# Patient Record
Sex: Female | Born: 1950 | Race: Black or African American | Hispanic: No | Marital: Married | Smoking: Current every day smoker
Health system: Southern US, Community
[De-identification: ages and names within clinical notes are randomized; demographics above are authoritative.]

## PROBLEM LIST (undated history)

## (undated) DIAGNOSIS — D649 Anemia, unspecified: Secondary | ICD-10-CM

## (undated) DIAGNOSIS — E785 Hyperlipidemia, unspecified: Secondary | ICD-10-CM

## (undated) DIAGNOSIS — I509 Heart failure, unspecified: Secondary | ICD-10-CM

## (undated) DIAGNOSIS — M109 Gout, unspecified: Secondary | ICD-10-CM

## (undated) DIAGNOSIS — N189 Chronic kidney disease, unspecified: Secondary | ICD-10-CM

## (undated) DIAGNOSIS — M1990 Unspecified osteoarthritis, unspecified site: Secondary | ICD-10-CM

## (undated) DIAGNOSIS — R768 Other specified abnormal immunological findings in serum: Secondary | ICD-10-CM

## (undated) DIAGNOSIS — I10 Essential (primary) hypertension: Secondary | ICD-10-CM

## (undated) HISTORY — PX: CARDIAC CATHETERIZATION: SHX172

## (undated) HISTORY — DX: Anemia, unspecified: D64.9

## (undated) HISTORY — PX: DIAGNOSTIC LAPAROSCOPY: SUR761

---

## 2004-04-26 ENCOUNTER — Emergency Department: Payer: Self-pay | Admitting: Emergency Medicine

## 2006-02-03 ENCOUNTER — Emergency Department: Payer: Self-pay | Admitting: Emergency Medicine

## 2008-09-23 ENCOUNTER — Inpatient Hospital Stay: Payer: Self-pay | Admitting: Internal Medicine

## 2009-06-02 DIAGNOSIS — I2510 Atherosclerotic heart disease of native coronary artery without angina pectoris: Secondary | ICD-10-CM | POA: Insufficient documentation

## 2010-02-12 HISTORY — PX: REPLACEMENT TOTAL KNEE: SUR1224

## 2010-06-09 DIAGNOSIS — M1710 Unilateral primary osteoarthritis, unspecified knee: Secondary | ICD-10-CM | POA: Insufficient documentation

## 2011-08-29 ENCOUNTER — Emergency Department: Payer: Self-pay | Admitting: Internal Medicine

## 2011-08-29 LAB — BASIC METABOLIC PANEL
Anion Gap: 11 (ref 7–16)
BUN: 25 mg/dL — ABNORMAL HIGH (ref 7–18)
Calcium, Total: 9.2 mg/dL (ref 8.5–10.1)
Chloride: 104 mmol/L (ref 98–107)
Co2: 25 mmol/L (ref 21–32)
Creatinine: 1.6 mg/dL — ABNORMAL HIGH (ref 0.60–1.30)
EGFR (African American): 40 — ABNORMAL LOW
EGFR (Non-African Amer.): 35 — ABNORMAL LOW
Glucose: 79 mg/dL (ref 65–99)
Osmolality: 283 (ref 275–301)
Potassium: 3.5 mmol/L (ref 3.5–5.1)
Sodium: 140 mmol/L (ref 136–145)

## 2011-08-29 LAB — URIC ACID: Uric Acid: 9.9 mg/dL — ABNORMAL HIGH (ref 2.6–6.0)

## 2012-06-05 DIAGNOSIS — M109 Gout, unspecified: Secondary | ICD-10-CM | POA: Insufficient documentation

## 2013-06-12 DIAGNOSIS — N185 Chronic kidney disease, stage 5: Secondary | ICD-10-CM | POA: Insufficient documentation

## 2014-01-19 DIAGNOSIS — M25512 Pain in left shoulder: Secondary | ICD-10-CM | POA: Insufficient documentation

## 2014-05-07 ENCOUNTER — Ambulatory Visit: Payer: Self-pay

## 2015-06-27 DIAGNOSIS — M1711 Unilateral primary osteoarthritis, right knee: Secondary | ICD-10-CM | POA: Insufficient documentation

## 2017-01-30 DIAGNOSIS — K219 Gastro-esophageal reflux disease without esophagitis: Secondary | ICD-10-CM | POA: Diagnosis not present

## 2017-01-30 DIAGNOSIS — E785 Hyperlipidemia, unspecified: Secondary | ICD-10-CM | POA: Diagnosis not present

## 2017-01-30 DIAGNOSIS — F17210 Nicotine dependence, cigarettes, uncomplicated: Secondary | ICD-10-CM | POA: Diagnosis not present

## 2017-01-30 DIAGNOSIS — N183 Chronic kidney disease, stage 3 (moderate): Secondary | ICD-10-CM | POA: Diagnosis not present

## 2017-01-30 DIAGNOSIS — B1920 Unspecified viral hepatitis C without hepatic coma: Secondary | ICD-10-CM | POA: Diagnosis not present

## 2017-01-30 DIAGNOSIS — I2510 Atherosclerotic heart disease of native coronary artery without angina pectoris: Secondary | ICD-10-CM | POA: Diagnosis not present

## 2017-01-30 DIAGNOSIS — R3915 Urgency of urination: Secondary | ICD-10-CM | POA: Diagnosis not present

## 2017-01-30 DIAGNOSIS — Z880 Allergy status to penicillin: Secondary | ICD-10-CM | POA: Diagnosis not present

## 2017-01-30 DIAGNOSIS — I129 Hypertensive chronic kidney disease with stage 1 through stage 4 chronic kidney disease, or unspecified chronic kidney disease: Secondary | ICD-10-CM | POA: Diagnosis not present

## 2017-04-04 ENCOUNTER — Other Ambulatory Visit: Payer: Self-pay | Admitting: Internal Medicine

## 2017-04-04 DIAGNOSIS — I25119 Atherosclerotic heart disease of native coronary artery with unspecified angina pectoris: Secondary | ICD-10-CM | POA: Diagnosis not present

## 2017-04-04 DIAGNOSIS — N184 Chronic kidney disease, stage 4 (severe): Secondary | ICD-10-CM | POA: Insufficient documentation

## 2017-04-04 DIAGNOSIS — Z1231 Encounter for screening mammogram for malignant neoplasm of breast: Secondary | ICD-10-CM | POA: Diagnosis not present

## 2017-04-04 DIAGNOSIS — I10 Essential (primary) hypertension: Secondary | ICD-10-CM | POA: Diagnosis not present

## 2017-04-04 DIAGNOSIS — M1711 Unilateral primary osteoarthritis, right knee: Secondary | ICD-10-CM | POA: Diagnosis not present

## 2017-04-04 DIAGNOSIS — Z0000 Encounter for general adult medical examination without abnormal findings: Secondary | ICD-10-CM | POA: Insufficient documentation

## 2017-04-04 DIAGNOSIS — N183 Chronic kidney disease, stage 3 (moderate): Secondary | ICD-10-CM | POA: Diagnosis not present

## 2017-04-04 DIAGNOSIS — Z1159 Encounter for screening for other viral diseases: Secondary | ICD-10-CM | POA: Diagnosis not present

## 2017-04-04 DIAGNOSIS — I119 Hypertensive heart disease without heart failure: Secondary | ICD-10-CM | POA: Insufficient documentation

## 2017-04-12 DIAGNOSIS — M76891 Other specified enthesopathies of right lower limb, excluding foot: Secondary | ICD-10-CM | POA: Diagnosis not present

## 2017-04-12 DIAGNOSIS — M79604 Pain in right leg: Secondary | ICD-10-CM | POA: Diagnosis not present

## 2017-04-12 DIAGNOSIS — M1711 Unilateral primary osteoarthritis, right knee: Secondary | ICD-10-CM | POA: Diagnosis not present

## 2017-04-16 DIAGNOSIS — Z1159 Encounter for screening for other viral diseases: Secondary | ICD-10-CM | POA: Diagnosis not present

## 2017-04-16 DIAGNOSIS — N183 Chronic kidney disease, stage 3 (moderate): Secondary | ICD-10-CM | POA: Diagnosis not present

## 2017-04-16 DIAGNOSIS — I10 Essential (primary) hypertension: Secondary | ICD-10-CM | POA: Diagnosis not present

## 2017-04-16 DIAGNOSIS — I25119 Atherosclerotic heart disease of native coronary artery with unspecified angina pectoris: Secondary | ICD-10-CM | POA: Diagnosis not present

## 2017-04-16 DIAGNOSIS — Z1231 Encounter for screening mammogram for malignant neoplasm of breast: Secondary | ICD-10-CM | POA: Diagnosis not present

## 2017-04-16 DIAGNOSIS — Z0000 Encounter for general adult medical examination without abnormal findings: Secondary | ICD-10-CM | POA: Diagnosis not present

## 2017-04-26 DIAGNOSIS — M1711 Unilateral primary osteoarthritis, right knee: Secondary | ICD-10-CM | POA: Diagnosis not present

## 2017-04-29 ENCOUNTER — Other Ambulatory Visit: Payer: Self-pay | Admitting: Surgery

## 2017-04-29 DIAGNOSIS — M1711 Unilateral primary osteoarthritis, right knee: Secondary | ICD-10-CM

## 2017-05-16 DIAGNOSIS — Z800 Family history of malignant neoplasm of digestive organs: Secondary | ICD-10-CM | POA: Diagnosis not present

## 2017-05-16 DIAGNOSIS — R768 Other specified abnormal immunological findings in serum: Secondary | ICD-10-CM | POA: Diagnosis not present

## 2017-05-17 ENCOUNTER — Ambulatory Visit
Admission: RE | Admit: 2017-05-17 | Discharge: 2017-05-17 | Disposition: A | Discharge status: Home / Self Care | Payer: Medicare HMO | Source: Ambulatory Visit | Attending: Surgery | Admitting: Surgery

## 2017-05-17 DIAGNOSIS — S83241A Other tear of medial meniscus, current injury, right knee, initial encounter: Secondary | ICD-10-CM | POA: Diagnosis not present

## 2017-05-17 DIAGNOSIS — M1711 Unilateral primary osteoarthritis, right knee: Secondary | ICD-10-CM | POA: Insufficient documentation

## 2017-05-17 DIAGNOSIS — X58XXXA Exposure to other specified factors, initial encounter: Secondary | ICD-10-CM | POA: Diagnosis not present

## 2017-05-17 DIAGNOSIS — M23321 Other meniscus derangements, posterior horn of medial meniscus, right knee: Secondary | ICD-10-CM | POA: Diagnosis not present

## 2017-05-23 ENCOUNTER — Ambulatory Visit
Admission: RE | Admit: 2017-05-23 | Discharge: 2017-05-23 | Disposition: A | Discharge status: Home / Self Care | Payer: Medicare HMO | Source: Ambulatory Visit | Attending: Internal Medicine | Admitting: Internal Medicine

## 2017-05-23 DIAGNOSIS — Z1231 Encounter for screening mammogram for malignant neoplasm of breast: Secondary | ICD-10-CM | POA: Diagnosis not present

## 2017-05-23 DIAGNOSIS — R928 Other abnormal and inconclusive findings on diagnostic imaging of breast: Secondary | ICD-10-CM | POA: Diagnosis not present

## 2017-05-28 ENCOUNTER — Other Ambulatory Visit: Payer: Self-pay | Admitting: Internal Medicine

## 2017-05-28 DIAGNOSIS — N6489 Other specified disorders of breast: Secondary | ICD-10-CM

## 2017-05-28 DIAGNOSIS — R928 Other abnormal and inconclusive findings on diagnostic imaging of breast: Secondary | ICD-10-CM

## 2017-05-29 ENCOUNTER — Other Ambulatory Visit: Payer: Self-pay

## 2017-05-29 ENCOUNTER — Encounter: Payer: Self-pay | Admitting: *Deleted

## 2017-05-29 ENCOUNTER — Ambulatory Visit
Admission: RE | Admit: 2017-05-29 | Discharge: 2017-05-29 | Disposition: A | Discharge status: Home / Self Care | Payer: Medicare HMO | Source: Ambulatory Visit | Attending: Surgery | Admitting: Surgery

## 2017-05-29 DIAGNOSIS — I509 Heart failure, unspecified: Secondary | ICD-10-CM | POA: Insufficient documentation

## 2017-05-29 DIAGNOSIS — Z01818 Encounter for other preprocedural examination: Secondary | ICD-10-CM | POA: Insufficient documentation

## 2017-05-29 DIAGNOSIS — I110 Hypertensive heart disease with heart failure: Secondary | ICD-10-CM | POA: Diagnosis not present

## 2017-05-29 DIAGNOSIS — R0989 Other specified symptoms and signs involving the circulatory and respiratory systems: Secondary | ICD-10-CM | POA: Diagnosis not present

## 2017-05-29 DIAGNOSIS — I10 Essential (primary) hypertension: Secondary | ICD-10-CM | POA: Diagnosis not present

## 2017-05-29 HISTORY — DX: Chronic kidney disease, unspecified: N18.9

## 2017-05-29 HISTORY — DX: Gout, unspecified: M10.9

## 2017-05-29 HISTORY — DX: Heart failure, unspecified: I50.9

## 2017-05-29 HISTORY — DX: Essential (primary) hypertension: I10

## 2017-05-29 HISTORY — DX: Hyperlipidemia, unspecified: E78.5

## 2017-05-29 HISTORY — DX: Unspecified osteoarthritis, unspecified site: M19.90

## 2017-05-29 HISTORY — DX: Other specified abnormal immunological findings in serum: R76.8

## 2017-05-29 LAB — BASIC METABOLIC PANEL
ANION GAP: 5 (ref 5–15)
BUN: 35 mg/dL — ABNORMAL HIGH (ref 6–20)
CALCIUM: 7.5 mg/dL — AB (ref 8.9–10.3)
CO2: 21 mmol/L — AB (ref 22–32)
Chloride: 113 mmol/L — ABNORMAL HIGH (ref 101–111)
Creatinine, Ser: 2.38 mg/dL — ABNORMAL HIGH (ref 0.44–1.00)
GFR, EST AFRICAN AMERICAN: 23 mL/min — AB (ref >60)
GFR, EST NON AFRICAN AMERICAN: 20 mL/min — AB (ref >60)
Glucose, Bld: 131 mg/dL — ABNORMAL HIGH (ref 65–99)
POTASSIUM: 3.8 mmol/L (ref 3.5–5.1)
Sodium: 139 mmol/L (ref 135–145)

## 2017-05-29 LAB — CBC
HEMATOCRIT: 38.9 % (ref 35.0–47.0)
Hemoglobin: 13.1 g/dL (ref 12.0–16.0)
MCH: 31 pg (ref 26.0–34.0)
MCHC: 33.6 g/dL (ref 32.0–36.0)
MCV: 92.2 fL (ref 80.0–100.0)
PLATELETS: 252 10*3/uL (ref 150–440)
RBC: 4.22 MIL/uL (ref 3.80–5.20)
RDW: 17.2 % — ABNORMAL HIGH (ref 11.5–14.5)
WBC: 7.7 10*3/uL (ref 3.6–11.0)

## 2017-05-29 LAB — TYPE AND SCREEN
ABO/RH(D): A POS
ANTIBODY SCREEN: NEGATIVE

## 2017-05-29 LAB — URINALYSIS, ROUTINE W REFLEX MICROSCOPIC
Bacteria, UA: NONE SEEN
Bilirubin Urine: NEGATIVE
GLUCOSE, UA: 50 mg/dL — AB
Hgb urine dipstick: NEGATIVE
Ketones, ur: NEGATIVE mg/dL
Leukocytes, UA: NEGATIVE
NITRITE: NEGATIVE
PH: 5 (ref 5.0–8.0)
Protein, ur: 100 mg/dL — AB
Specific Gravity, Urine: 1.012 (ref 1.005–1.030)
Squamous Epithelial / LPF: NONE SEEN

## 2017-05-29 LAB — SURGICAL PCR SCREEN
MRSA, PCR: NEGATIVE
Staphylococcus aureus: POSITIVE — AB

## 2017-05-29 LAB — PROTIME-INR
INR: 0.9
Prothrombin Time: 12.1 seconds (ref 11.4–15.2)

## 2017-05-29 NOTE — Patient Instructions (Signed)
Your procedure is scheduled on: Tuesday, June 11, 2017 Report to Day Surgery on the 2nd floor of the [facility]. To find out your arrival time, please call [phone] between 1PM - 3PM on: Monday, June 10, 2017  REMEMBER: Instructions that are not followed completely may result in serious medical risk, up to and including death; or upon the discretion of your surgeon and anesthesiologist your surgery may need to be rescheduled.  Do not eat food after midnight the night before your procedure.  No gum chewing, lozengers or hard candies.  You may however, drink CLEAR liquids up to 2 hours before you are scheduled to arrive for your surgery. Do not drink anything within 2 hours of the start of your surgery.  Clear liquids include: - water  - apple juice without pulp - clear gatorade - black coffee or tea (Do NOT add anything to the coffee or tea) Do NOT drink anything that is not on this list.  No Alcohol for 24 hours before or after surgery.  No Smoking including e-cigarettes for 24 hours prior to surgery.  No chewable tobacco products for at least 6 hours prior to surgery.  No nicotine patches on the day of surgery.  On the morning of surgery brush your teeth with toothpaste and water, you may rinse your mouth with mouthwash if you wish. Do not swallow any toothpaste or mouthwash.  Notify your doctor if there is any change in your medical condition (cold, fever, infection).  Do not wear jewelry, make-up, hairpins, clips or nail polish.  Do not wear lotions, powders, or perfumes. You may wear deodorant.  Do not shave 48 hours prior to surgery.   Contacts and dentures may not be worn into surgery.  Do not bring valuables to the hospital, including drivers license, insurance or credit cards.   is not responsible for any belongings or valuables.   TAKE THESE MEDICATIONS THE MORNING OF SURGERY:  1.  AMLODIPINE 2.  IMDUR 3.  METOPROLOL  Use CHG Soap as  directed on instruction sheet.  Follow recommendations from PCP regarding stopping Aspirin.  ON April 23 - Stop Anti-inflammatories (NSAIDS) such as Advil, Aleve, Ibuprofen, Motrin, Naproxen, Naprosyn and Aspirin based products such as Excedrin, Goodys Powder, BC Powder. (May take Tylenol or Acetaminophen if needed.)  ON April 23 - Stop ANY OVER THE COUNTER supplements until after surgery.  Wear comfortable clothing (specific to your surgery type) to the hospital.  Plan for stool softeners for home use.  If you are being admitted to the hospital overnight, leave your suitcase in the car. After surgery it may be brought to your room.  If you are taking public transportation, you will need to have a responsible adult with you. Please confirm with your physician that it is acceptable to use public transportation.   Please call [phone] if you have any questions about these instructions.

## 2017-05-30 ENCOUNTER — Encounter: Payer: Self-pay | Admitting: *Deleted

## 2017-05-30 NOTE — Pre-Procedure Instructions (Addendum)
FAXED POSITIVE STAPH TO [facility] AS INSTRUCTED BY DR J ADAMS, EKG/LABS/REQUEST FOR CLEARANCE REGARDING WORSENING RENAL FUNCTION AND EKG TO DR Bethann Punches. ALSO FAXED TO DR POGGI. SPOKE WITH April AT DR ANDERSON'S

## 2017-06-03 DIAGNOSIS — I25119 Atherosclerotic heart disease of native coronary artery with unspecified angina pectoris: Secondary | ICD-10-CM | POA: Diagnosis not present

## 2017-06-03 DIAGNOSIS — I119 Hypertensive heart disease without heart failure: Secondary | ICD-10-CM | POA: Diagnosis not present

## 2017-06-03 DIAGNOSIS — N183 Chronic kidney disease, stage 3 (moderate): Secondary | ICD-10-CM | POA: Diagnosis not present

## 2017-06-04 NOTE — Pre-Procedure Instructions (Signed)
CLEARED MOD RISK BY DR Ouida Sills 06/03/17

## 2017-06-07 ENCOUNTER — Ambulatory Visit
Admission: RE | Admit: 2017-06-07 | Discharge: 2017-06-07 | Disposition: A | Discharge status: Home / Self Care | Payer: Medicare HMO | Source: Ambulatory Visit | Attending: Internal Medicine | Admitting: Internal Medicine

## 2017-06-07 DIAGNOSIS — R928 Other abnormal and inconclusive findings on diagnostic imaging of breast: Secondary | ICD-10-CM | POA: Diagnosis not present

## 2017-06-07 DIAGNOSIS — N6489 Other specified disorders of breast: Secondary | ICD-10-CM | POA: Insufficient documentation

## 2017-06-07 DIAGNOSIS — R922 Inconclusive mammogram: Secondary | ICD-10-CM | POA: Diagnosis not present

## 2017-06-10 MED ORDER — CLINDAMYCIN PHOSPHATE 900 MG/50ML IV SOLN
900.0000 mg | Freq: Once | INTRAVENOUS | Status: AC
  Administered 2017-06-11: 900 mg via INTRAVENOUS

## 2017-06-11 ENCOUNTER — Encounter
Admission: RE | Disposition: A | Discharge status: Home / Self Care | Payer: Self-pay | Source: Ambulatory Visit | Attending: Surgery

## 2017-06-11 ENCOUNTER — Ambulatory Visit: Payer: Medicare HMO | Admitting: Anesthesiology

## 2017-06-11 ENCOUNTER — Inpatient Hospital Stay: Payer: Medicare HMO

## 2017-06-11 ENCOUNTER — Other Ambulatory Visit: Payer: Self-pay

## 2017-06-11 ENCOUNTER — Observation Stay
Admission: RE | Admit: 2017-06-11 | Discharge: 2017-06-12 | Disposition: A | Discharge status: Home / Self Care | Payer: Medicare HMO | Source: Ambulatory Visit | Attending: Surgery | Admitting: Surgery

## 2017-06-11 DIAGNOSIS — Z79899 Other long term (current) drug therapy: Secondary | ICD-10-CM | POA: Insufficient documentation

## 2017-06-11 DIAGNOSIS — Z96651 Presence of right artificial knee joint: Secondary | ICD-10-CM | POA: Diagnosis not present

## 2017-06-11 DIAGNOSIS — M109 Gout, unspecified: Secondary | ICD-10-CM | POA: Insufficient documentation

## 2017-06-11 DIAGNOSIS — M1711 Unilateral primary osteoarthritis, right knee: Principal | ICD-10-CM | POA: Insufficient documentation

## 2017-06-11 DIAGNOSIS — E785 Hyperlipidemia, unspecified: Secondary | ICD-10-CM | POA: Insufficient documentation

## 2017-06-11 DIAGNOSIS — I130 Hypertensive heart and chronic kidney disease with heart failure and stage 1 through stage 4 chronic kidney disease, or unspecified chronic kidney disease: Secondary | ICD-10-CM | POA: Insufficient documentation

## 2017-06-11 DIAGNOSIS — I509 Heart failure, unspecified: Secondary | ICD-10-CM | POA: Insufficient documentation

## 2017-06-11 DIAGNOSIS — F17200 Nicotine dependence, unspecified, uncomplicated: Secondary | ICD-10-CM | POA: Diagnosis not present

## 2017-06-11 DIAGNOSIS — N183 Chronic kidney disease, stage 3 (moderate): Secondary | ICD-10-CM | POA: Diagnosis not present

## 2017-06-11 DIAGNOSIS — Z471 Aftercare following joint replacement surgery: Secondary | ICD-10-CM | POA: Diagnosis not present

## 2017-06-11 HISTORY — PX: PARTIAL KNEE ARTHROPLASTY: SHX2174

## 2017-06-11 LAB — ABO/RH: ABO/RH(D): A POS

## 2017-06-11 SURGERY — ARTHROPLASTY, KNEE, UNICOMPARTMENTAL
Anesthesia: General | Site: Knee | Laterality: Right | Wound class: Clean

## 2017-06-11 MED ORDER — HYDROMORPHONE HCL 1 MG/ML IJ SOLN: 0.5000 mg | INTRAMUSCULAR | Status: DC | PRN

## 2017-06-11 MED ORDER — ACETAMINOPHEN 500 MG PO TABS
1000.0000 mg | ORAL_TABLET | Freq: Four times a day (QID) | ORAL | Status: AC
  Administered 2017-06-11 – 2017-06-12 (×4): 1000 mg via ORAL
  Filled 2017-06-11 (×4): qty 2

## 2017-06-11 MED ORDER — SODIUM CHLORIDE 0.9 % IV SOLN
INTRAVENOUS | Status: DC
  Administered 2017-06-11: 11:00:00 via INTRAVENOUS

## 2017-06-11 MED ORDER — METOCLOPRAMIDE HCL 5 MG/ML IJ SOLN: 5.0000 mg | Freq: Three times a day (TID) | INTRAMUSCULAR | Status: DC | PRN

## 2017-06-11 MED ORDER — ACETAMINOPHEN 325 MG PO TABS: 325.0000 mg | ORAL_TABLET | Freq: Four times a day (QID) | ORAL | Status: DC | PRN

## 2017-06-11 MED ORDER — SODIUM CHLORIDE 0.9 % IV SOLN
INTRAVENOUS | Status: DC
  Administered 2017-06-11: 06:00:00 via INTRAVENOUS

## 2017-06-11 MED ORDER — ONDANSETRON HCL 4 MG PO TABS: 4.0000 mg | ORAL_TABLET | Freq: Four times a day (QID) | ORAL | Status: DC | PRN

## 2017-06-11 MED ORDER — METOPROLOL SUCCINATE ER 100 MG PO TB24
200.0000 mg | ORAL_TABLET | Freq: Every day | ORAL | Status: DC
  Administered 2017-06-12: 200 mg via ORAL
  Filled 2017-06-11: qty 2

## 2017-06-11 MED ORDER — OXYCODONE HCL 5 MG PO TABS
5.0000 mg | ORAL_TABLET | ORAL | Status: DC | PRN
  Administered 2017-06-11 – 2017-06-12 (×3): 5 mg via ORAL
  Administered 2017-06-12: 10 mg via ORAL
  Filled 2017-06-11 (×2): qty 1
  Filled 2017-06-11: qty 2
  Filled 2017-06-11: qty 1

## 2017-06-11 MED ORDER — OXYCODONE HCL 5 MG/5ML PO SOLN: 5.0000 mg | Freq: Once | ORAL | Status: DC | PRN

## 2017-06-11 MED ORDER — BUPIVACAINE-EPINEPHRINE (PF) 0.5% -1:200000 IJ SOLN
INTRAMUSCULAR | Status: AC
  Filled 2017-06-11: qty 30

## 2017-06-11 MED ORDER — ONDANSETRON HCL 4 MG/2ML IJ SOLN
INTRAMUSCULAR | Status: DC | PRN
  Administered 2017-06-11: 4 mg via INTRAVENOUS

## 2017-06-11 MED ORDER — MAGNESIUM HYDROXIDE 400 MG/5ML PO SUSP: 30.0000 mL | Freq: Every day | ORAL | Status: DC | PRN

## 2017-06-11 MED ORDER — ENOXAPARIN SODIUM 30 MG/0.3ML ~~LOC~~ SOLN
30.0000 mg | SUBCUTANEOUS | Status: DC
  Filled 2017-06-11: qty 0.3

## 2017-06-11 MED ORDER — TRANEXAMIC ACID 1000 MG/10ML IV SOLN
INTRAVENOUS | Status: AC
  Filled 2017-06-11: qty 10

## 2017-06-11 MED ORDER — PROPOFOL 10 MG/ML IV BOLUS
INTRAVENOUS | Status: DC | PRN
  Administered 2017-06-11: 150 mg via INTRAVENOUS

## 2017-06-11 MED ORDER — SUGAMMADEX SODIUM 200 MG/2ML IV SOLN
INTRAVENOUS | Status: DC | PRN
  Administered 2017-06-11: 100 mg via INTRAVENOUS

## 2017-06-11 MED ORDER — TRAMADOL HCL 50 MG PO TABS
50.0000 mg | ORAL_TABLET | Freq: Four times a day (QID) | ORAL | Status: DC | PRN
  Administered 2017-06-11 – 2017-06-12 (×2): 50 mg via ORAL
  Filled 2017-06-11 (×2): qty 1

## 2017-06-11 MED ORDER — SODIUM CHLORIDE 0.9 % IJ SOLN
INTRAMUSCULAR | Status: AC
  Filled 2017-06-11: qty 50

## 2017-06-11 MED ORDER — REMIFENTANIL HCL 1 MG IV SOLR
INTRAVENOUS | Status: DC | PRN
  Administered 2017-06-11: .01 ug/kg/min via INTRAVENOUS

## 2017-06-11 MED ORDER — FLEET ENEMA 7-19 GM/118ML RE ENEM: 1.0000 | ENEMA | Freq: Once | RECTAL | Status: DC | PRN

## 2017-06-11 MED ORDER — DIPHENHYDRAMINE HCL 12.5 MG/5ML PO ELIX: 12.5000 mg | ORAL_SOLUTION | ORAL | Status: DC | PRN

## 2017-06-11 MED ORDER — NEOMYCIN-POLYMYXIN B GU 40-200000 IR SOLN
Status: AC
  Filled 2017-06-11: qty 20

## 2017-06-11 MED ORDER — ROCURONIUM BROMIDE 50 MG/5ML IV SOLN
INTRAVENOUS | Status: AC
Start: 2017-06-11 — End: ?
  Filled 2017-06-11: qty 1

## 2017-06-11 MED ORDER — NEOMYCIN-POLYMYXIN B GU 40-200000 IR SOLN
Status: DC | PRN
  Administered 2017-06-11: 14 mL

## 2017-06-11 MED ORDER — ASPIRIN EC 81 MG PO TBEC
81.0000 mg | DELAYED_RELEASE_TABLET | Freq: Every day | ORAL | Status: DC
  Administered 2017-06-12: 81 mg via ORAL
  Filled 2017-06-11: qty 1

## 2017-06-11 MED ORDER — CLINDAMYCIN PHOSPHATE 900 MG/50ML IV SOLN
INTRAVENOUS | Status: AC
  Filled 2017-06-11: qty 50

## 2017-06-11 MED ORDER — ATORVASTATIN CALCIUM 20 MG PO TABS
40.0000 mg | ORAL_TABLET | Freq: Every day | ORAL | Status: DC
  Administered 2017-06-12: 40 mg via ORAL
  Filled 2017-06-11: qty 2

## 2017-06-11 MED ORDER — FENTANYL CITRATE (PF) 100 MCG/2ML IJ SOLN
INTRAMUSCULAR | Status: DC | PRN
  Administered 2017-06-11: 100 ug via INTRAVENOUS

## 2017-06-11 MED ORDER — LIDOCAINE HCL (CARDIAC) PF 100 MG/5ML IV SOSY
PREFILLED_SYRINGE | INTRAVENOUS | Status: DC | PRN
  Administered 2017-06-11: 100 mg via INTRAVENOUS

## 2017-06-11 MED ORDER — SUGAMMADEX SODIUM 200 MG/2ML IV SOLN
INTRAVENOUS | Status: AC
  Filled 2017-06-11: qty 2

## 2017-06-11 MED ORDER — ALLOPURINOL 100 MG PO TABS
100.0000 mg | ORAL_TABLET | Freq: Every day | ORAL | Status: DC
  Administered 2017-06-12: 100 mg via ORAL
  Filled 2017-06-11 (×2): qty 1

## 2017-06-11 MED ORDER — MUPIROCIN 2 % EX OINT
1.0000 "application " | TOPICAL_OINTMENT | Freq: Two times a day (BID) | CUTANEOUS | Status: DC
  Administered 2017-06-11 – 2017-06-12 (×2): 1 via NASAL
  Filled 2017-06-11: qty 22

## 2017-06-11 MED ORDER — ROCURONIUM BROMIDE 50 MG/5ML IV SOLN
INTRAVENOUS | Status: AC
  Filled 2017-06-11: qty 1

## 2017-06-11 MED ORDER — BUPIVACAINE LIPOSOME 1.3 % IJ SUSP
INTRAMUSCULAR | Status: AC
  Filled 2017-06-11: qty 20

## 2017-06-11 MED ORDER — PHENYLEPHRINE HCL 10 MG/ML IJ SOLN
INTRAMUSCULAR | Status: AC
  Filled 2017-06-11: qty 1

## 2017-06-11 MED ORDER — REMIFENTANIL HCL 1 MG IV SOLR
INTRAVENOUS | Status: AC
  Filled 2017-06-11: qty 1000

## 2017-06-11 MED ORDER — DEXTROSE 5 % IV SOLN
Freq: Four times a day (QID) | INTRAVENOUS | Status: AC
  Administered 2017-06-11 – 2017-06-12 (×3): via INTRAVENOUS
  Filled 2017-06-11 (×5): qty 6

## 2017-06-11 MED ORDER — ONDANSETRON HCL 4 MG/2ML IJ SOLN
INTRAMUSCULAR | Status: AC
  Filled 2017-06-11: qty 2

## 2017-06-11 MED ORDER — ACETAMINOPHEN 10 MG/ML IV SOLN
INTRAVENOUS | Status: DC | PRN
  Administered 2017-06-11: 1000 mg via INTRAVENOUS

## 2017-06-11 MED ORDER — LISINOPRIL 20 MG PO TABS
30.0000 mg | ORAL_TABLET | Freq: Every day | ORAL | Status: DC
  Administered 2017-06-12: 30 mg via ORAL
  Filled 2017-06-11: qty 2

## 2017-06-11 MED ORDER — OXYCODONE HCL 5 MG PO TABS: 5.0000 mg | ORAL_TABLET | Freq: Once | ORAL | Status: DC | PRN

## 2017-06-11 MED ORDER — FENTANYL CITRATE (PF) 100 MCG/2ML IJ SOLN: 25.0000 ug | INTRAMUSCULAR | Status: DC | PRN

## 2017-06-11 MED ORDER — BUPIVACAINE-EPINEPHRINE (PF) 0.5% -1:200000 IJ SOLN
INTRAMUSCULAR | Status: DC | PRN
  Administered 2017-06-11: 30 mL via PERINEURAL

## 2017-06-11 MED ORDER — BISACODYL 10 MG RE SUPP: 10.0000 mg | Freq: Every day | RECTAL | Status: DC | PRN

## 2017-06-11 MED ORDER — METOCLOPRAMIDE HCL 10 MG PO TABS: 5.0000 mg | ORAL_TABLET | Freq: Three times a day (TID) | ORAL | Status: DC | PRN

## 2017-06-11 MED ORDER — ONDANSETRON HCL 4 MG/2ML IJ SOLN: 4.0000 mg | Freq: Four times a day (QID) | INTRAMUSCULAR | Status: DC | PRN

## 2017-06-11 MED ORDER — ROCURONIUM BROMIDE 100 MG/10ML IV SOLN
INTRAVENOUS | Status: DC | PRN
  Administered 2017-06-11: 50 mg via INTRAVENOUS

## 2017-06-11 MED ORDER — ISOSORBIDE MONONITRATE ER 60 MG PO TB24
60.0000 mg | ORAL_TABLET | Freq: Every day | ORAL | Status: DC
  Administered 2017-06-12: 60 mg via ORAL
  Filled 2017-06-11: qty 1

## 2017-06-11 MED ORDER — AMLODIPINE BESYLATE 10 MG PO TABS
10.0000 mg | ORAL_TABLET | Freq: Every day | ORAL | Status: DC
  Administered 2017-06-12: 10 mg via ORAL
  Filled 2017-06-11: qty 1

## 2017-06-11 MED ORDER — ACETAMINOPHEN 10 MG/ML IV SOLN
INTRAVENOUS | Status: AC
  Filled 2017-06-11: qty 100

## 2017-06-11 MED ORDER — GLYCOPYRROLATE 0.2 MG/ML IJ SOLN
INTRAMUSCULAR | Status: DC | PRN
  Administered 2017-06-11: 0.2 mg via INTRAVENOUS

## 2017-06-11 MED ORDER — FAMOTIDINE 20 MG PO TABS
20.0000 mg | ORAL_TABLET | Freq: Once | ORAL | Status: AC
  Administered 2017-06-11: 20 mg via ORAL

## 2017-06-11 MED ORDER — CHLORHEXIDINE GLUCONATE CLOTH 2 % EX PADS
6.0000 | MEDICATED_PAD | Freq: Every day | CUTANEOUS | Status: DC
  Administered 2017-06-12 (×2): 6 via TOPICAL

## 2017-06-11 MED ORDER — FENTANYL CITRATE (PF) 100 MCG/2ML IJ SOLN
INTRAMUSCULAR | Status: AC
  Filled 2017-06-11: qty 2

## 2017-06-11 MED ORDER — MIDAZOLAM HCL 2 MG/2ML IJ SOLN
INTRAMUSCULAR | Status: DC | PRN
  Administered 2017-06-11: 2 mg via INTRAVENOUS

## 2017-06-11 MED ORDER — LIDOCAINE HCL (PF) 2 % IJ SOLN
INTRAMUSCULAR | Status: AC
  Filled 2017-06-11: qty 10

## 2017-06-11 MED ORDER — PROPOFOL 10 MG/ML IV BOLUS
INTRAVENOUS | Status: AC
  Filled 2017-06-11: qty 20

## 2017-06-11 MED ORDER — DOCUSATE SODIUM 100 MG PO CAPS
100.0000 mg | ORAL_CAPSULE | Freq: Two times a day (BID) | ORAL | Status: DC
  Administered 2017-06-12: 100 mg via ORAL
  Filled 2017-06-11 (×2): qty 1

## 2017-06-11 MED ORDER — PANTOPRAZOLE SODIUM 40 MG PO TBEC
40.0000 mg | DELAYED_RELEASE_TABLET | Freq: Every day | ORAL | Status: DC
  Administered 2017-06-12: 40 mg via ORAL
  Filled 2017-06-11: qty 1

## 2017-06-11 MED ORDER — MIDAZOLAM HCL 2 MG/2ML IJ SOLN
INTRAMUSCULAR | Status: AC
  Filled 2017-06-11: qty 2

## 2017-06-11 MED ORDER — FAMOTIDINE 20 MG PO TABS
ORAL_TABLET | ORAL | Status: AC
  Administered 2017-06-11: 20 mg via ORAL
  Filled 2017-06-11: qty 1

## 2017-06-11 MED ORDER — SODIUM CHLORIDE 0.9 % IV SOLN
INTRAVENOUS | Status: DC | PRN
  Administered 2017-06-11: 60 mL

## 2017-06-11 MED ORDER — CLINDAMYCIN PHOSPHATE 900 MG/50ML IV SOLN
900.0000 mg | Freq: Four times a day (QID) | INTRAVENOUS | Status: DC
  Administered 2017-06-11: 900 mg via INTRAVENOUS
  Filled 2017-06-11 (×4): qty 50

## 2017-06-11 MED ORDER — EPHEDRINE SULFATE 50 MG/ML IJ SOLN
INTRAMUSCULAR | Status: DC | PRN
  Administered 2017-06-11: 15 mg via INTRAVENOUS

## 2017-06-11 SURGICAL SUPPLY — 60 items
BANDAGE ACE 6X5 VEL STRL LF (GAUZE/BANDAGES/DRESSINGS) ×3 IMPLANT
BEARING TIBIAL OXFORD R XS S7 (Knees) ×3 IMPLANT
CANISTER SUCT 1200ML W/VALVE (MISCELLANEOUS) ×3 IMPLANT
CANISTER SUCT 3000ML PPV (MISCELLANEOUS) ×3 IMPLANT
CEMENT BONE R 1X40 (Cement) ×3 IMPLANT
CEMENT VACUUM MIXING SYSTEM (MISCELLANEOUS) ×3 IMPLANT
CHLORAPREP W/TINT 26ML (MISCELLANEOUS) ×6 IMPLANT
COOLER POLAR GLACIER W/PUMP (MISCELLANEOUS) ×3 IMPLANT
COVER MAYO STAND STRL (DRAPES) ×3 IMPLANT
CUFF TOURN 24 STER (MISCELLANEOUS) IMPLANT
CUFF TOURN 30 STER DUAL PORT (MISCELLANEOUS) ×3 IMPLANT
DRAPE C-ARM XRAY 36X54 (DRAPES) IMPLANT
DRSG OPSITE POSTOP 4X12 (GAUZE/BANDAGES/DRESSINGS) IMPLANT
DRSG OPSITE POSTOP 4X14 (GAUZE/BANDAGES/DRESSINGS) IMPLANT
DRSG OPSITE POSTOP 4X6 (GAUZE/BANDAGES/DRESSINGS) IMPLANT
ELECT CAUTERY BLADE 6.4 (BLADE) ×3 IMPLANT
ELECT REM PT RETURN 9FT ADLT (ELECTROSURGICAL) ×3
ELECTRODE REM PT RTRN 9FT ADLT (ELECTROSURGICAL) ×1 IMPLANT
GAUZE PETRO XEROFOAM 1X8 (MISCELLANEOUS) IMPLANT
GAUZE SPONGE 4X4 12PLY STRL (GAUZE/BANDAGES/DRESSINGS) ×3 IMPLANT
GLOVE BIO SURGEON STRL SZ7.5 (GLOVE) ×12 IMPLANT
GLOVE BIO SURGEON STRL SZ8 (GLOVE) ×12 IMPLANT
GLOVE BIOGEL PI IND STRL 8 (GLOVE) ×1 IMPLANT
GLOVE BIOGEL PI INDICATOR 8 (GLOVE) ×2
GLOVE INDICATOR 8.0 STRL GRN (GLOVE) ×3 IMPLANT
GOWN STRL REUS W/ TWL LRG LVL3 (GOWN DISPOSABLE) ×1 IMPLANT
GOWN STRL REUS W/ TWL XL LVL3 (GOWN DISPOSABLE) ×1 IMPLANT
GOWN STRL REUS W/TWL LRG LVL3 (GOWN DISPOSABLE) ×2
GOWN STRL REUS W/TWL XL LVL3 (GOWN DISPOSABLE) ×2
HOLDER FOLEY CATH W/STRAP (MISCELLANEOUS) ×3 IMPLANT
HOOD PEEL AWAY FLYTE STAYCOOL (MISCELLANEOUS) ×12 IMPLANT
KIT TURNOVER KIT A (KITS) ×3 IMPLANT
KNEE PARTIAL CEMENT FEM XS (Miscellaneous) ×3 IMPLANT
MAT BLUE FLOOR 46X72 FLO (MISCELLANEOUS) IMPLANT
NDL SAFETY ECLIPSE 18X1.5 (NEEDLE) ×1 IMPLANT
NEEDLE HYPO 18GX1.5 SHARP (NEEDLE) ×2
NEEDLE SPNL 20GX3.5 QUINCKE YW (NEEDLE) ×6 IMPLANT
NS IRRIG 1000ML POUR BTL (IV SOLUTION) ×3 IMPLANT
PACK BLADE SAW RECIP 70 3 PT (BLADE) ×6 IMPLANT
PACK TOTAL KNEE (MISCELLANEOUS) ×3 IMPLANT
PAD WRAPON POLAR KNEE (MISCELLANEOUS) ×1 IMPLANT
PULSAVAC PLUS IRRIG FAN TIP (DISPOSABLE) ×3
SOL .9 NS 3000ML IRR  AL (IV SOLUTION) ×2
SOL .9 NS 3000ML IRR UROMATIC (IV SOLUTION) ×1 IMPLANT
SPONGE XRAY 4X4 16PLY STRL (MISCELLANEOUS) IMPLANT
STAPLER SKIN PROX 35W (STAPLE) ×3 IMPLANT
STRAP SAFETY 5IN WIDE (MISCELLANEOUS) ×3 IMPLANT
SUCTION FRAZIER HANDLE 10FR (MISCELLANEOUS) ×2
SUCTION TUBE FRAZIER 10FR DISP (MISCELLANEOUS) ×1 IMPLANT
SUT VIC AB 0 CT1 36 (SUTURE) ×3 IMPLANT
SUT VIC AB 2-0 CT1 27 (SUTURE) ×8
SUT VIC AB 2-0 CT1 TAPERPNT 27 (SUTURE) ×4 IMPLANT
SYR 10ML LL (SYRINGE) ×3 IMPLANT
SYR 20CC LL (SYRINGE) ×3 IMPLANT
SYR 30ML LL (SYRINGE) ×9 IMPLANT
TAPE TRANSPORE STRL 2 31045 (GAUZE/BANDAGES/DRESSINGS) ×3 IMPLANT
TIP FAN IRRIG PULSAVAC PLUS (DISPOSABLE) ×1 IMPLANT
TRAY FOLEY W/METER SILVER 16FR (SET/KITS/TRAYS/PACK) IMPLANT
TRAY TIBIA MEDIAL OXFORD SZ A (Joint) ×3 IMPLANT
WRAPON POLAR PAD KNEE (MISCELLANEOUS) ×3

## 2017-06-11 NOTE — Evaluation (Signed)
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  PMH includes: CHF, HTN, OA, CKD III, hep-c, and L TKA.    Clinical Impression  Pt presents with deficits in strength, transfers, mobility, gait, R knee ROM, balance, and activity tolerance but overall performed well considering POD 0 status.  Pt was Ind with bed mobility tasks and CGA with transfers with mod verbal cues for sequencing.  Pt was very impulsive during the session and frequently initiated movement prior to instructions being completed for sequencing.  Pt was antalgic and guarded on the RLE during amb and went from step-to gait to hop-to gait after several steps.  I anticipate patient to progress well in acute care and to be appropriate for HHPT services upon discharge to address the above deficits for return towards PLOF.      Follow Up Recommendations Home health PT;Supervision for mobility/OOB    Equipment Recommendations  None recommended by PT    Recommendations for Other Services       Precautions / Restrictions Precautions Precautions: Knee;Fall Precaution Comments: Pt able to perform Ind RLE SLR x 10 without extensor lag Restrictions Weight Bearing Restrictions: Yes RLE Weight Bearing: Weight bearing as tolerated      Mobility  Bed Mobility Overal bed mobility: Independent                Transfers Overall transfer level: Needs assistance Equipment used: Rolling walker (2 wheeled) Transfers: Sit to/from Stand Sit to Stand: Min guard         General transfer comment: Pt steady with good concentric control and fair eccentric control during sit to/from stand transfers with mod verbal cues for sequencing.  Ambulation/Gait Ambulation/Gait assistance: Min guard Ambulation  Distance (Feet): 5 Feet Assistive device: Rolling walker (2 wheeled) Gait Pattern/deviations: Step-to pattern;Antalgic;Decreased step length - left;Decreased stance time - right Gait velocity: Decreased   General Gait Details: Pt antalgic on the RLE during amb and went from step-to gait to hop-to gait after several steps  Stairs            Wheelchair Mobility    Modified Rankin (Stroke Patients Only)       Balance Overall balance assessment: Mild deficits observed, not formally tested                                           Pertinent Vitals/Pain Pain Assessment: 0-10 Pain Score: 8  Pain Descriptors / Indicators: Aching;Sore;Operative site guarding Pain Intervention(s): Monitored during session;Limited activity within patient's tolerance;Patient requesting pain meds-RN notified;Premedicated before session    Home Living Family/patient expects to be discharged to:: Private residence Living Arrangements: Spouse/significant other Available Help at Discharge: Family Type of Home: Apartment Home Access: Stairs to enter Entrance Stairs-Rails: Right;Left(Can not reach both) Entrance Stairs-Number of Steps: 4 Home Layout: One level Home Equipment: Walker - 2 wheels      Prior Function Level of Independence: Independent         Comments: Ind amb without AD without fall history, Ind with all ADLs     Hand Dominance   Dominant Hand: Right    Extremity/Trunk Assessment   Upper Extremity Assessment Upper Extremity  Assessment: Overall WFL for tasks assessed    Lower Extremity Assessment Lower Extremity Assessment: Generalized weakness;RLE deficits/detail RLE Deficits / Details: R hip flex 3+/5, R ankle DF and PF strong against manual resistance RLE: Unable to fully assess due to pain RLE Sensation: WNL       Communication   Communication: No difficulties  Cognition Arousal/Alertness: Awake/alert Behavior During Therapy: WFL for tasks  assessed/performed Overall Cognitive Status: Within Functional Limits for tasks assessed                                 General Comments: Pt impulsive and frequently initiates movement prior to instructions being completed      General Comments      Exercises Total Joint Exercises Ankle Circles/Pumps: Strengthening;Both;10 reps Quad Sets: Strengthening;Both;10 reps Gluteal Sets: Strengthening;Both;10 reps Hip ABduction/ADduction: AROM;Both;10 reps Straight Leg Raises: AROM;Both;10 reps Long Arc Quad: AROM;Right;5 reps;10 reps Knee Flexion: AROM;Right;5 reps;10 reps Goniometric ROM: R knee AROM: 4-105 deg Marching in Standing: AROM;Both;5 reps Other Exercises Other Exercises: HEP education per handout Other Exercises: Positioning education to encourage R knee ext at rest   Assessment/Plan    PT Assessment Patient needs continued PT services  PT Problem List Decreased strength;Decreased range of motion;Decreased activity tolerance;Decreased balance;Decreased knowledge of use of DME       PT Treatment Interventions DME instruction;Gait training;Stair training;Functional mobility training;Balance training;Therapeutic exercise;Therapeutic activities;Patient/family education    PT Goals (Current goals can be found in the Care Plan section)  Acute Rehab PT Goals Patient Stated Goal: To get back home PT Goal Formulation: With patient Time For Goal Achievement: 06/24/17 Potential to Achieve Goals: Good    Frequency BID   Barriers to discharge        Co-evaluation               AM-PAC PT "6 Clicks" Daily Activity  Outcome Measure Difficulty turning over in bed (including adjusting bedclothes, sheets and blankets)?: None Difficulty moving from lying on back to sitting on the side of the bed? : None Difficulty sitting down on and standing up from a chair with arms (e.g., wheelchair, bedside commode, etc,.)?: Unable Help needed moving to and from a bed to  chair (including a wheelchair)?: A Little Help needed walking in hospital room?: A Lot Help needed climbing 3-5 steps with a railing? : A Lot 6 Click Score: 16    End of Session Equipment Utilized During Treatment: Gait belt;Oxygen Activity Tolerance: Patient limited by pain Patient left: in chair;with chair alarm set;with call bell/phone within reach Nurse Communication: Mobility status PT Visit Diagnosis: Muscle weakness (generalized) (M62.81);Other abnormalities of gait and mobility (R26.89)    Time: 5374-8270 PT Time Calculation (min) (ACUTE ONLY): 34 min   Charges:   PT Evaluation $PT Eval Low Complexity: 1 Low PT Treatments $Therapeutic Exercise: 8-22 mins   PT G Codes:        DRoyetta Asal PT, DPT 06/11/17, 4:14 PM

## 2017-06-11 NOTE — H&P (Signed)
Paper H&P to be scanned into permanent record. H&P reviewed and patient re-examined. No changes. 

## 2017-06-11 NOTE — Op Note (Signed)
06/11/2017  9:45 AM  Patient:   Anna Key  Pre-Op Diagnosis:   Osteoarthritis of medial compartment, right knee.  Post-Op Diagnosis:   Same  Procedure:   Right unicondylar knee arthroplasty.  Surgeon:   Pascal Lux, MD  Assistant:   Cameron Proud, PA-C;  Cadence Kathlen Mody, PA-S  Anesthesia:   GET  Findings:   As above.  Complications:   None  EBL:   5 cc  Fluids:   800 cc crystalloid  UOP:   None  TT:   80 minutes at 300 mmHg  Drains:   None  Closure:   Staples  Implants:   All-cemented Biomet Oxford system with an extra small femoral component, an "A" sized tibial tray, and a 7 mm meniscal bearing insert.  Brief Clinical Note:   The patient is a 67 year old female with a long history of progressively worsening medial sided right knee pain. Her symptoms have progressed despite medications, activity modification, etc. Her history and examination are consistent with degenerative joint disease confirmed by plain radiographs. An MRI scan demonstrated only minimal degenerative changes laterally and involving the patellofemoral compartment. The patient presents at this time for a right partial knee replacement.  Procedure:   The patient was brought into the operating room and lain in the supine position.  After adequate general endotracheal intubation and anesthesia were obtained, the patient was repositioned so that the non-surgical leg was placed in a flexed and abducted position in the yellow fin leg holder while the surgical extremity was placed over the Biomet leg holder. The right lower extremity was prepped with ChloraPrep solution before being draped sterilely. Preoperative antibiotics were administered. After performing a timeout to verify the appropriate surgical site, the limb was exsanguinated with an Esmarch and the tourniquet inflated to 300 mmHg. A standard anterior approach to the knee was made through an approximately 3.5-4 inch incision. The incision was  carried down through the subcutaneous tissues to expose the superficial retinaculum. This was split the length the incision and medial flap elevated sufficiently to expose the medial retinaculum. This was incised along the medial border of the patella tendon and extended proximally along the medial border of the patella, leaving a 3-4 mm cuff of tissue. The soft tissues were elevated off the anteromedial aspect of the proximal tibia. The anterior portion of the meniscus was removed after performing a subtotal excision of the infrapatellar fat pad. The anterior cruciate ligament was inspected and found to be in excellent condition. Osteophytes were removed from the inferior pole of the patella as well as from the notch using a quarter-inch osteotome. There were significant degenerative changes of both the femur and tibia on the medial side. The medial femoral condyle was sized using the small and extra small sizers. It was felt that the extra small guide best optimized the contour of the femur. This was left in place and the external tibial guide positioned. The coupling device was used to connect the guide to the medial femoral condylar sizer to optimize appropriate orientation. Two guide pins were inserted into the cutting block before the coupling device and sizer were removed. The appropriate tibial cut was made using the oscillating and reciprocating saws. The piece was removed in its entirety and taken to the back table where it was sized and found to be optimally replicated by an "A" sized component. The 9 mm spacer was inserted to verify that sufficient bone had been removed.  Attention was directed to femoral side.  The intramedullary canal was accessed through a 4 mm drill hole. The intramedullary guide was positioned before the guide for the femoral condylar holes was positioned. The appropriate coupling device connected this guide to the intramedullary guide before both drill holes were created in the  distal aspect of the medial femoral condyle. The devices were removed and the posterior condylar cutting block inserted. The appropriate cut was made using the reciprocating saw and this piece removed. The #0 spigot was inserted and the initial bone milling performed. A trial femoral component was inserted and both the flexion and extension gaps measured. In flexion, the gap measured 9 mm whereas in extension, it measured 7 mm. Therefore, the #2 spigot was selected and the secondary bone milling performed. Repeat sizing demonstrated symmetric flexion and extension gaps. The bone was removed from the postero-medial and postero-lateral aspects of the femoral condyle, as well as from the beneath the collar of the spigot. Bone also was removed from the anterior portion of the femur so as to minimize any potential impingement with the meniscal bearing insert. The trial components removed and several drill holes placed into the distal femoral condyle to further augment cement fixation.  Attention was redirected to the tibial side. The "A" sized tibial tray was positioned and temporarily secured using the appropriate spiked nail. The keel was created using the bi-bladed reciprocating saw and hoe. The keeled "A" sized trial tibial tray was inserted to be sure that it seated properly. At this point, a total of 20 cc of Exparel diluted out to 60 cc with normal saline and 30 cc of 0.5% Sensorcaine was injected in and around the posterior and medial capsular tissues, as well as the peri-incisional tissues to help with postoperative pain control.  The bony surfaces were prepared for cementing by irrigating them thoroughly with bacitracin saline solution using the jet lavage system before packing them with a dry Ray-Tec sponge. Meanwhile, cement was being mixed on the back table. When the cement was ready, the tibial tray was cemented in first. The excess cement was removed using a Surveyor, quantity after impacting it into  place. Next, the femoral component was impacted into place. Again the excess cement was removed using a Surveyor, quantity. The 7 mm spacer was inserted and the knee brought into near full extension while the cement hardened. Once the cement hardened, the spacer was removed and the 7 mm meniscal bearing insert was trialed. This demonstrated excellent tracking while the knee was placed through a range of motion, and showed no evidence towards subluxation or dislocation. In addition, it did not fit too tightly. Therefore, the permanent 7 mm meniscal bearing insert was snapped into position after verifying that no cement had been retained posteriorly. Again the knee was placed through a range of motion with the findings as described above.  The wound was copiously irrigated with bacitracin saline solution via the jet lavage system before the retinacular layer was reapproximated using #0 Vicryl interrupted sutures. At this point, 1 g of transexemic acid in 10 cc of normal saline was injected intra-articularly. The subcutaneous tissues were closed in two layers using 2-0 Vicryl interrupted sutures before the skin was closed using staples. A sterile occlusive dressing was applied to the knee before the patient was awakened. The patient was transferred back to his/her hospital bed and returned to the recovery room in satisfactory condition after tolerating the procedure well. A Polar Care device was applied to the knee as well.

## 2017-06-11 NOTE — Anesthesia Postprocedure Evaluation (Signed)
Anesthesia Post Note  Patient: Anna Key  Procedure(s) Performed: UNICOMPARTMENTAL KNEE (Right Knee)  Patient location during evaluation: PACU Anesthesia Type: General Level of consciousness: awake and alert Pain management: pain level controlled Vital Signs Assessment: post-procedure vital signs reviewed and stable Respiratory status: spontaneous breathing, nonlabored ventilation, respiratory function stable and patient connected to nasal cannula oxygen Cardiovascular status: blood pressure returned to baseline and stable Postop Assessment: no apparent nausea or vomiting Anesthetic complications: no     Last Vitals:  Vitals:   06/11/17 1124 06/11/17 1128  BP: 119/82   Pulse: 65   Resp: 14   Temp: (!) 36.3 C   SpO2: (!) 89% 94%    Last Pain:  Vitals:   06/11/17 1128  TempSrc:   PainSc: 8                  [name] K Chalmer Zheng

## 2017-06-11 NOTE — Progress Notes (Signed)
15 minute call to floor. 

## 2017-06-11 NOTE — NC FL2 (Signed)
  [place] LEVEL OF CARE SCREENING TOOL     IDENTIFICATION  Patient Name: Anna Key Birthdate: 01-Jun-1950 Sex: female Admission Date (Current Location): 06/11/2017  [place] and [place] Number:  Engineering geologist and Address:  [facility], [address] [place], [place] [address]      Provider Number: [id number]  Attending Physician Name and Address:  Corky Mull, MD  Relative Name and Phone Number:       Current Level of Care: Hospital Recommended Level of Care: [place] Prior Approval Number:    Date Approved/Denied:   PASRR Number: [id number])  Discharge Plan: SNF    Current Diagnoses: Patient Active Problem List   Diagnosis Date Noted  . Status post right partial knee replacement 06/11/2017    Orientation RESPIRATION BLADDER Height & Weight     Self, Time, Situation, Place  O2(4L Nasal Cannula) Continent Weight:   Height:     BEHAVIORAL SYMPTOMS/MOOD NEUROLOGICAL BOWEL NUTRITION STATUS      Continent Diet  AMBULATORY STATUS COMMUNICATION OF NEEDS Skin   Extensive Assist Verbally Surgical wounds(Incision Right Knee)                       Personal Care Assistance Level of Assistance  Bathing, Feeding, Dressing Bathing Assistance: Limited assistance Feeding assistance: Independent Dressing Assistance: Limited assistance     Functional Limitations Info  Sight, Speech, Hearing Sight Info: Adequate Hearing Info: Adequate Speech Info: Adequate    SPECIAL CARE FACTORS FREQUENCY  PT (By licensed PT), OT (By licensed OT)     PT Frequency: (5) OT Frequency: (5)            Contractures      Additional Factors Info  Code Status, Allergies Code Status Info: (Not on file) Allergies Info: (PENICILLINS )           Current Medications (06/11/2017):  This is the current hospital active medication list Current Facility-Administered Medications  Medication Dose Route Frequency  Provider Last Rate Last Dose  . 0.9 %  sodium chloride infusion   Intravenous Continuous Gunnar Bulla, MD 50 mL/hr at 06/11/17 [phone]    . fentaNYL (SUBLIMAZE) injection 25-50 mcg  25-50 mcg Intravenous Q5 min PRN Piscitello, Precious Haws, MD      . oxyCODONE (Oxy IR/ROXICODONE) immediate release tablet 5 mg  5 mg Oral Once PRN Piscitello, Precious Haws, MD       Or  . oxyCODONE (ROXICODONE) 5 MG/5ML solution 5 mg  5 mg Oral Once PRN Piscitello, Precious Haws, MD         Discharge Medications: Please see discharge summary for a list of discharge medications.  Relevant Imaging Results:  Relevant Lab Results:   Additional Information (SSN: [id number])  Smith Mince, Student-Social Work

## 2017-06-11 NOTE — Anesthesia Preprocedure Evaluation (Signed)
Anesthesia Evaluation  Patient identified by MRN, date of birth, ID band Patient awake    Reviewed: Allergy & Precautions, H&P , NPO status , Patient's Chart, lab work & pertinent test results  History of Anesthesia Complications Negative for: history of anesthetic complications  Airway Mallampati: III  TM Distance: <3 FB Neck ROM: full    Dental  (+) Chipped, Poor Dentition, Missing   Pulmonary neg shortness of breath, Current Smoker,           Cardiovascular Exercise Tolerance: Good hypertension, (-) angina+CHF  (-) Past MI and (-) DOE      Neuro/Psych negative neurological ROS  negative psych ROS   GI/Hepatic negative GI ROS, Neg liver ROS, neg GERD  ,  Endo/Other  negative endocrine ROS  Renal/GU Renal disease     Musculoskeletal  (+) Arthritis ,   Abdominal   Peds  Hematology negative hematology ROS (+)   Anesthesia Other Findings Past Medical History: No date: Arthritis     Comment:  osteoarthritis of right knee No date: CHF (congestive heart failure) (HCC) No date: Chronic kidney disease     Comment:  stage 3 No date: Gout No date: Hepatitis C antibody test positive No date: Hyperlipidemia No date: Hypertension  Past Surgical History: No date: CARDIAC CATHETERIZATION 2012: REPLACEMENT TOTAL KNEE; Left     Reproductive/Obstetrics negative OB ROS                             Anesthesia Physical Anesthesia Plan  ASA: III  Anesthesia Plan: General ETT   Post-op Pain Management:    Induction: Intravenous  PONV Risk Score and Plan: Ondansetron, Dexamethasone and Midazolam  Airway Management Planned: Oral ETT  Additional Equipment:   Intra-op Plan:   Post-operative Plan: Extubation in OR  Informed Consent: I have reviewed the patients History and Physical, chart, labs and discussed the procedure including the risks, benefits and alternatives for the proposed  anesthesia with the patient or authorized representative who has indicated his/her understanding and acceptance.   Dental Advisory Given  Plan Discussed with: Anesthesiologist, CRNA and Surgeon  Anesthesia Plan Comments: (Patient declines spinals  Patient consented for risks of anesthesia including but not limited to:  - adverse reactions to medications - damage to teeth, lips or other oral mucosa - sore throat or hoarseness - Damage to heart, brain, lungs or loss of life  Patient voiced understanding.)        Anesthesia Quick Evaluation

## 2017-06-11 NOTE — Anesthesia Procedure Notes (Signed)
Procedure Name: Intubation Date/Time: 06/11/2017 7:39 AM Performed by: Bernardo Heater, CRNA Pre-anesthesia Checklist: Patient identified, Emergency Drugs available, Suction available and Patient being monitored Patient Re-evaluated:Patient Re-evaluated prior to induction Oxygen Delivery Method: Circle system utilized Preoxygenation: Pre-oxygenation with 100% oxygen Laryngoscope Size: Mac and 3 Grade View: Grade II Tube type: Oral Tube size: 7.0 mm Number of attempts: 1 Placement Confirmation: ETT inserted through vocal cords under direct vision,  positive ETCO2 and breath sounds checked- equal and bilateral Secured at: 21 cm Tube secured with: Tape Dental Injury: Teeth and Oropharynx as per pre-operative assessment

## 2017-06-11 NOTE — Transfer of Care (Signed)
Immediate Anesthesia Transfer of Care Note  Patient: Anna Key  Procedure(s) Performed: UNICOMPARTMENTAL KNEE (Right Knee)  Patient Location: PACU  Anesthesia Type:General  Level of Consciousness: awake and patient cooperative  Airway & Oxygen Therapy: Patient Spontanous Breathing and Patient connected to nasal cannula oxygen  Post-op Assessment: Report given to RN and Post -op Vital signs reviewed and stable  Post vital signs: Reviewed and stable  Last Vitals:  Vitals Value Taken Time  BP 114/82 06/11/2017 10:01 AM  Temp    Pulse 81 06/11/2017 10:02 AM  Resp 15 06/11/2017 10:02 AM  SpO2 94 % 06/11/2017 10:02 AM  Vitals shown include unvalidated device data.  Last Pain:  Vitals:   06/11/17 0618  TempSrc: Oral  PainSc: 7          Complications: No apparent anesthesia complications

## 2017-06-11 NOTE — Progress Notes (Signed)
Anticoagulation monitoring(Lovenox):  67yo  female ordered Lovenox 40 mg Q24h  There were no vitals filed for this visit. BMI 23.5   Lab Results  Component Value Date   CREATININE 2.38 (H) 05/29/2017   CREATININE 1.60 (H) 08/29/2011   Estimated Creatinine Clearance: 25.6 mL/min (A) (by C-G formula based on SCr of 2.38 mg/dL (H)). Hemoglobin & Hematocrit     Component Value Date/Time   HGB 13.1 05/29/2017 1336   HCT 38.9 05/29/2017 1336     Per Protocol for Patient with estCrcl < 30 ml/min and BMI < 40, will transition to Lovenox 30 mg Q24h.

## 2017-06-11 NOTE — Anesthesia Post-op Follow-up Note (Signed)
Anesthesia QCDR form completed.        

## 2017-06-11 NOTE — Progress Notes (Signed)
ADMISSIONNOTE:  Pt admitted to room 158 from PACU. Pt alert and oriented X4. Pts surgical dressing clean dry and intact. Bed in lowest position call belll in reach and bed alarm on.

## 2017-06-12 ENCOUNTER — Encounter: Payer: Self-pay | Admitting: Surgery

## 2017-06-12 DIAGNOSIS — M1711 Unilateral primary osteoarthritis, right knee: Secondary | ICD-10-CM | POA: Diagnosis not present

## 2017-06-12 DIAGNOSIS — I509 Heart failure, unspecified: Secondary | ICD-10-CM | POA: Diagnosis not present

## 2017-06-12 DIAGNOSIS — I130 Hypertensive heart and chronic kidney disease with heart failure and stage 1 through stage 4 chronic kidney disease, or unspecified chronic kidney disease: Secondary | ICD-10-CM | POA: Diagnosis not present

## 2017-06-12 DIAGNOSIS — F17200 Nicotine dependence, unspecified, uncomplicated: Secondary | ICD-10-CM | POA: Diagnosis not present

## 2017-06-12 DIAGNOSIS — E785 Hyperlipidemia, unspecified: Secondary | ICD-10-CM | POA: Diagnosis not present

## 2017-06-12 DIAGNOSIS — M109 Gout, unspecified: Secondary | ICD-10-CM | POA: Diagnosis not present

## 2017-06-12 DIAGNOSIS — N183 Chronic kidney disease, stage 3 (moderate): Secondary | ICD-10-CM | POA: Diagnosis not present

## 2017-06-12 DIAGNOSIS — Z79899 Other long term (current) drug therapy: Secondary | ICD-10-CM | POA: Diagnosis not present

## 2017-06-12 LAB — BASIC METABOLIC PANEL
ANION GAP: 7 (ref 5–15)
BUN: 30 mg/dL — ABNORMAL HIGH (ref 6–20)
CALCIUM: 6.9 mg/dL — AB (ref 8.9–10.3)
CO2: 23 mmol/L (ref 22–32)
Chloride: 106 mmol/L (ref 101–111)
Creatinine, Ser: 2.55 mg/dL — ABNORMAL HIGH (ref 0.44–1.00)
GFR calc non Af Amer: 19 mL/min — ABNORMAL LOW (ref >60)
GFR, EST AFRICAN AMERICAN: 22 mL/min — AB (ref >60)
GLUCOSE: 100 mg/dL — AB (ref 65–99)
POTASSIUM: 4.4 mmol/L (ref 3.5–5.1)
Sodium: 136 mmol/L (ref 135–145)

## 2017-06-12 LAB — CBC WITH DIFFERENTIAL/PLATELET
Basophils Absolute: 0 10*3/uL (ref 0–0.1)
Basophils Relative: 0 %
Eosinophils Absolute: 0 10*3/uL (ref 0–0.7)
Eosinophils Relative: 0 %
HCT: 36 % (ref 35.0–47.0)
Hemoglobin: 11.9 g/dL — ABNORMAL LOW (ref 12.0–16.0)
Lymphocytes Relative: 13 %
Lymphs Abs: 1.2 10*3/uL (ref 1.0–3.6)
MCH: 30.4 pg (ref 26.0–34.0)
MCHC: 33 g/dL (ref 32.0–36.0)
MCV: 92 fL (ref 80.0–100.0)
Monocytes Absolute: 0.9 10*3/uL (ref 0.2–0.9)
Monocytes Relative: 10 %
Neutro Abs: 6.9 10*3/uL — ABNORMAL HIGH (ref 1.4–6.5)
Neutrophils Relative %: 77 %
Platelets: 193 10*3/uL (ref 150–440)
RBC: 3.91 MIL/uL (ref 3.80–5.20)
RDW: 16.8 % — ABNORMAL HIGH (ref 11.5–14.5)
WBC: 9 10*3/uL (ref 3.6–11.0)

## 2017-06-12 MED ORDER — ENOXAPARIN SODIUM 40 MG/0.4ML ~~LOC~~ SOLN: 40.0000 mg | SUBCUTANEOUS | 0 refills | Status: DC

## 2017-06-12 MED ORDER — ENOXAPARIN SODIUM 30 MG/0.3ML ~~LOC~~ SOLN: 30.0000 mg | SUBCUTANEOUS | 0 refills | Status: DC

## 2017-06-12 MED ORDER — OXYCODONE HCL 5 MG PO TABS: 5.0000 mg | ORAL_TABLET | ORAL | 0 refills | Status: DC | PRN

## 2017-06-12 NOTE — Care Management Note (Signed)
Case Management Note  Patient Details  Name: Anna Key MRN: [id number] Date of Birth: 12/02/1950  Subjective/Objective:  POD # 1 right unicompartmental knee. Met with patient at bedside to discuss discharge planning. Patient lives with her spouse. She has a walker. Offered list of home care providers. [facility] prefers [facility]. Referral to [facility] with [facility] for HHPT. Spoke with Lance regarding Lovenox. He clarified correct dose and has given patient a prescription. Patient will transport by car.                    Action/Plan:   Expected Discharge Date:  06/12/17               Expected Discharge Plan:  Home w Home Health Services  In-House Referral:     Discharge planning Services  CM Consult  Post Acute Care Choice:  Home Health Choice offered to:  Patient  DME Arranged:    DME Agency:     HH Arranged:  PT HH Agency:  [facility]  Status of Service:  Completed, signed off  If discussed at Long Length of Stay Meetings, dates discussed:    Additional Comments:   M , RN 06/12/2017, 3:03 PM  

## 2017-06-12 NOTE — Progress Notes (Signed)
  Subjective: 1 Day Post-Op Procedure(s) (LRB): UNICOMPARTMENTAL KNEE (Right) Patient reports pain as mild.   Patient is well, and has had no acute complaints or problems Plan is to go Home after hospital stay. Negative for chest pain and shortness of breath Fever: no Gastrointestinal:Negative for nausea and vomiting  Objective: Vital signs in last 24 hours: Temp:  [97.3 F (36.3 C)-98 F (36.7 C)] 97.9 F (36.6 C) (05/01 0417) Pulse Rate:  [52-81] 72 (05/01 0417) Resp:  [11-18] 18 (05/01 0417) BP: (114-142)/(82-94) 124/83 (05/01 0417) SpO2:  [89 %-100 %] 98 % (05/01 0417) Weight:  [88.5 kg (195 lb)] 88.5 kg (195 lb) (04/30 1702)  Intake/Output from previous day:  Intake/Output Summary (Last 24 hours) at 06/12/2017 0758 Last data filed at 06/12/2017 0503 Gross per 24 hour  Intake 2741.25 ml  Output 305 ml  Net 2436.25 ml    Intake/Output this shift: No intake/output data recorded.  Labs: Recent Labs    06/12/17 0258  HGB 11.9*   Recent Labs    06/12/17 0258  WBC 9.0  RBC 3.91  HCT 36.0  PLT 193   Recent Labs    06/12/17 0258  NA 136  K 4.4  CL 106  CO2 23  BUN 30*  CREATININE 2.55*  GLUCOSE 100*  CALCIUM 6.9*   No results for input(s): LABPT, INR in the last 72 hours.   EXAM General - Patient is Alert, Appropriate and Oriented Extremity - ABD soft Sensation intact distally Intact pulses distally Dorsiflexion/Plantar flexion intact Incision: scant drainage No cellulitis present Dressing/Incision - blood tinged drainage Motor Function - intact, moving foot and toes well on exam.  Abdomen soft with normal BS.  Past Medical History:  Diagnosis Date  . Arthritis    osteoarthritis of right knee  . CHF (congestive heart failure) ([place])   . Chronic kidney disease    stage 3  . Gout   . Hepatitis C antibody test positive   . Hyperlipidemia   . Hypertension     Assessment/Plan: 1 Day Post-Op Procedure(s) (LRB): UNICOMPARTMENTAL KNEE  (Right) Active Problems:   Status post right partial knee replacement  Estimated body mass index is 32.95 kg/m as calculated from the following:   Height as of this encounter: 5' 4.5" (1.638 m).   Weight as of this encounter: 88.5 kg (195 lb). Advance diet Up with therapy D/C IV fluids when tolerating po intake.  Labs reviewed this AM, 11.9 hg this morning. Up with therapy today. Dressing change today. Plan will be for possible discharge today pending progress with PT today.  DVT Prophylaxis - Lovenox, Foot Pumps and TED hose Weight-Bearing as tolerated to right leg  J. Cameron Proud, PA-C [facility] 06/12/2017, 7:58 AM

## 2017-06-12 NOTE — Care Management Obs Status (Signed)
[place] NOTIFICATION   Patient Details  Name: Anna Key MRN: [id number] Date of Birth: 11-Nov-1950   Medicare Observation Status Notification Given:  Yes    Jolly Mango, RN 06/12/2017, 3:11 PM

## 2017-06-12 NOTE — Progress Notes (Signed)
Clinical Social Worker (CSW) received SNF consult. PT is recommending home health. RN case manager aware of above. Please reconsult if future social work needs arise. CSW signing off.   [name], LCSW [phone] 

## 2017-06-12 NOTE — Progress Notes (Signed)
Discharge instructions and prescriptions given to pt. IV removed. No questions or concerns from pt. Pt getting dressed and awaiting ride to be discharged home.

## 2017-06-12 NOTE — Discharge Instructions (Signed)

## 2017-06-12 NOTE — Care Management CC44 (Signed)
Condition Code 44 Documentation Completed  Patient Details  Name: Anna Key MRN: [id number] Date of Birth: March 01, 1950   Condition Code 44 given:  Yes Patient signature on Condition Code 44 notice:  Yes Documentation of 2 MD's agreement:  Yes Code 44 added to claim:  Yes    Jolly Mango, RN 06/12/2017, 3:11 PM

## 2017-06-12 NOTE — Progress Notes (Signed)
Physical Therapy Treatment Patient Details Name: Anna Key MRN: [id number] DOB: 02-27-1950 Today's Date: 06/12/2017    History of Present Illness Pt is a 67 yo F diagnosed with OA of the medial compartment of the R knee and is s/p elective R unicondylar knee arthroplasy.  PMH includes: CHF, HTN, OA, CKD III, hep-c, and L TKA.    PT Comments    Pt presents with minor deficits in strength, transfers, mobility, gait, R knee ROM, balance, and activity tolerance but is progressing well towards goals.  Pt was able to amb 100' with a RW and SBA with improving reciprocal gait and RLE stance time.  Pt was steady ascending and descending 4 stairs with one rail with min verbal cues for sequencing.  Pt's R knee AROM 3-102 deg with HEP and positioning reviewed.  Pt will benefit from HHPT services upon discharge to safely address above deficits for decreased caregiver assistance and eventual return to PLOF.       Follow Up Recommendations  Home health PT;Supervision for mobility/OOB     Equipment Recommendations  None recommended by PT    Recommendations for Other Services       Precautions / Restrictions Precautions Precautions: Knee;Fall Precaution Comments: Pt able to perform Ind RLE SLR x 10 without extensor lag Restrictions Weight Bearing Restrictions: Yes RLE Weight Bearing: Weight bearing as tolerated    Mobility  Bed Mobility Overal bed mobility: Independent                Transfers Overall transfer level: Needs assistance Equipment used: Rolling walker (2 wheeled) Transfers: Sit to/from Stand Sit to Stand: Supervision         General transfer comment: Pt steady with good concentric control and fair eccentric control during sit to/from stand transfers with mod verbal cues for sequencing.  Ambulation/Gait Ambulation/Gait assistance: Supervision Ambulation Distance (Feet): 100 Feet Assistive device: Rolling walker (2 wheeled) Gait Pattern/deviations:  Step-through pattern;Decreased step length - left;Decreased stance time - right Gait velocity: Decreased but progressing   General Gait Details: Pt antalgic on the RLE during amb but much improved from prior session    Stairs: Ascend/descend 4 steps with one rail and SBA with min verbal cues for sequencing with pt presenting with good eccentric and concentric control and stability.            Wheelchair Mobility    Modified Rankin (Stroke Patients Only)       Balance Overall balance assessment: Mild deficits observed, not formally tested                                          Cognition Arousal/Alertness: Awake/alert Behavior During Therapy: WFL for tasks assessed/performed Overall Cognitive Status: Within Functional Limits for tasks assessed                                 General Comments: Pt impulsive and frequently initiates movement prior to instructions being completed      Exercises Total Joint Exercises Ankle Circles/Pumps: Strengthening;Both;10 reps Quad Sets: Strengthening;Both;10 reps Gluteal Sets: Strengthening;Both;10 reps Hip ABduction/ADduction: AROM;Both;10 reps Straight Leg Raises: AROM;Both;10 reps Long Arc Quad: AROM;Right;10 reps;15 reps Knee Flexion: AROM;Right;10 reps;15 reps Goniometric ROM: R knee AROM: 3-102 deg Marching in Standing: AROM;Both;10 reps Other Exercises Other Exercises: Positioning education to encourage R knee ext  at rest Other Exercises: 90 deg R turn training to prevent CKC twisting on the RLE    General Comments        Pertinent Vitals/Pain Pain Assessment: 0-10 Pain Score: 7  Pain Location: R knee Pain Descriptors / Indicators: Aching;Sore;Operative site guarding Pain Intervention(s): Monitored during session;Premedicated before session    Home Living                      Prior Function            PT Goals (current goals can now be found in the care plan section)  Progress towards PT goals: Progressing toward goals    Frequency    BID      PT Plan Current plan remains appropriate    Co-evaluation              AM-PAC PT "6 Clicks" Daily Activity  Outcome Measure                   End of Session Equipment Utilized During Treatment: Gait belt Activity Tolerance: Patient tolerated treatment well Patient left: in chair;with chair alarm set;with call bell/phone within reach;with SCD's reapplied;Other (comment)(Polar care to R knee) Nurse Communication: Mobility status PT Visit Diagnosis: Muscle weakness (generalized) (M62.81);Other abnormalities of gait and mobility (R26.89)     Time: 3474-2595 PT Time Calculation (min) (ACUTE ONLY): 43 min  Charges:  $Gait Training: 23-37 mins $Therapeutic Exercise: 8-22 mins                    G Codes:       D. Royetta Asal PT, DPT 06/12/17, 10:56 AM

## 2017-06-12 NOTE — Progress Notes (Signed)
PT Cancellation Note  Patient Details Name: Anna Key MRN: [id number] DOB: 1950-04-28   Cancelled Treatment:    Reason Eval/Treat Not Completed: Patient declined PM therapy session secondary to preparing for discharge home.     Linus Salmons PT, DPT 06/12/17, 3:54 PM

## 2017-06-12 NOTE — Discharge Summary (Signed)
Physician Discharge Summary  Patient ID: Anna Key MRN: [id number] DOB/AGE: Dec 18, 1950 67 y.o.  Admit date: 06/11/2017 Discharge date: 06/12/2017  Admission Diagnoses:  primary osteoarthritis of right knee  Osteoarthritis of medial compartment of the right knee  Discharge Diagnoses: Patient Active Problem List   Diagnosis Date Noted  . Status post right partial knee replacement 06/11/2017    Past Medical History:  Diagnosis Date  . Arthritis    osteoarthritis of right knee  . CHF (congestive heart failure) ([place])   . Chronic kidney disease    stage 3  . Gout   . Hepatitis C antibody test positive   . Hyperlipidemia   . Hypertension      Transfusion: None.   Consultants (if any):   Discharged Condition: Improved  Hospital Course: Anna Key is an 67 y.o. female who was admitted 06/11/2017 with a diagnosis of osteoarthritis of the medial compartment of the right knee and went to the operating room on 06/11/2017 and underwent the above named procedures.    Surgeries: Procedure(s): UNICOMPARTMENTAL KNEE on 06/11/2017 Patient tolerated the surgery well. Taken to PACU where she was stabilized and then transferred to the orthopedic floor.  Started on Lovenox 40mg  q 24 hrs however due to BMI and history of CKD III she was changed to 30mg  q 24 hours for discharge.. Foot pumps applied bilaterally at 80 mm. Heels elevated on bed with rolled towels. No evidence of DVT. Negative [place]. Physical therapy started on day #1 for gait training and transfer. OT started day #1 for ADL and assisted devices.  Patient's IV was removed on POD1.  Implants: All-cemented Biomet [place] system with an extra small femoral component, an "A" sized tibial tray, and a 7 mm meniscal bearing insert.  She was given perioperative antibiotics:  Anti-infectives (From admission, onward)   Start     Dose/Rate Route Frequency Ordered Stop   06/11/17 2215  clindamycin (CLEOCIN) 900 mg in dextrose  5 % 50 mL injection      Intravenous Every 6 hours 06/11/17 2202 06/12/17 1045   06/11/17 1400  clindamycin (CLEOCIN) IVPB 900 mg  Status:  Discontinued     900 mg 100 mL/hr over 30 Minutes Intravenous Every 6 hours 06/11/17 1057 06/11/17 2200   06/11/17 0558  clindamycin (CLEOCIN) 900 MG/50ML IVPB    Note to Pharmacy:  Phineas Real   : cabinet override      06/11/17 0558 06/11/17 0803   06/10/17 2145  clindamycin (CLEOCIN) IVPB 900 mg     900 mg 100 mL/hr over 30 Minutes Intravenous  Once 06/10/17 2141 06/11/17 0818    .  She was given sequential compression devices, early ambulation, and Lovenox for DVT prophylaxis.  She benefited maximally from the hospital stay and there were no complications.    Recent vital signs:  Vitals:   06/12/17 0950 06/12/17 1044  BP:  (!) 135/96  Pulse: 62 66  Resp:    Temp:    SpO2:  93%    Recent laboratory studies:  Lab Results  Component Value Date   HGB 11.9 (L) 06/12/2017   HGB 13.1 05/29/2017   Lab Results  Component Value Date   WBC 9.0 06/12/2017   PLT 193 06/12/2017   Lab Results  Component Value Date   INR 0.90 05/29/2017   Lab Results  Component Value Date   NA 136 06/12/2017   K 4.4 06/12/2017   CL 106 06/12/2017   CO2 23 06/12/2017  BUN 30 (H) 06/12/2017   CREATININE 2.55 (H) 06/12/2017   GLUCOSE 100 (H) 06/12/2017    Discharge Medications:   Allergies as of 06/12/2017      Reactions   Penicillins Hives, Rash   Has patient had a PCN reaction causing immediate rash, facial/tongue/throat swelling, SOB or lightheadedness with hypotension: Yes Has patient had a PCN reaction causing severe rash involving mucus membranes or skin necrosis: Yes--all over body Has patient had a PCN reaction that required hospitalization: No  Has patient had a PCN reaction occurring within the last 10 years: No If all of the above answers are "NO", then may proceed with Cephalosporin use.      Medication List    TAKE these  medications   allopurinol 100 MG tablet Commonly known as:  ZYLOPRIM Take 100 mg by mouth daily.   amLODipine 10 MG tablet Commonly known as:  NORVASC Take 10 mg by mouth daily.   aspirin EC 81 MG tablet Take 81 mg by mouth daily.   atorvastatin 40 MG tablet Commonly known as:  LIPITOR Take 40 mg by mouth daily.   enoxaparin 40 MG/0.4ML injection Commonly known as:  LOVENOX Inject 0.4 mLs (40 mg total) into the skin daily.   enoxaparin 30 MG/0.3ML injection Commonly known as:  LOVENOX Inject 0.3 mLs (30 mg total) into the skin daily. Start taking on:  06/13/2017   isosorbide mononitrate 60 MG 24 hr tablet Commonly known as:  IMDUR Take 60 mg by mouth daily.   lisinopril 10 MG tablet Commonly known as:  PRINIVIL,ZESTRIL Take 30 mg by mouth daily.   metoprolol 200 MG 24 hr tablet Commonly known as:  TOPROL-XL Take 200 mg by mouth daily.   oxyCODONE 5 MG immediate release tablet Commonly known as:  Oxy IR/ROXICODONE Take 1-2 tablets (5-10 mg total) by mouth every 4 (four) hours as needed for moderate pain.            Durable Medical Equipment  (From admission, onward)        Start     Ordered   06/11/17 1058  DME Bedside commode  Once    Question:  Patient needs a bedside commode to treat with the following condition  Answer:  Status post right partial knee replacement   06/11/17 1057   06/11/17 1058  DME 3 n 1  Once     06/11/17 1057   06/11/17 1058  DME Walker rolling  Once    Question:  Patient needs a walker to treat with the following condition  Answer:  Status post right partial knee replacement   06/11/17 1057      Diagnostic Studies: Dg Chest 2 View  Result Date: 05/30/2017 CLINICAL DATA:  Right knee surgery.  Preoperative exam. EXAM: CHEST - 2 VIEW COMPARISON:  09/23/2008. FINDINGS: Mediastinum hilar structures normal. Cardiomegaly. Mild pulmonary vascular congestion. No focal infiltrate pulmonary edema. No pleural effusion or pneumothorax.  Degenerative changes thoracic spine. IMPRESSION: Cardiomegaly with mild pulmonary vascular congestion. No focal pulmonary infiltrate or pulmonary edema. Electronically Signed   By: Marcello Moores  Register   On: 05/30/2017 06:46   Mr Knee Right Wo Contrast  Result Date: 05/17/2017 CLINICAL DATA:  Generalized anterior knee pain for 1 year EXAM: MRI OF THE RIGHT KNEE WITHOUT CONTRAST TECHNIQUE: Multiplanar, multisequence MR imaging of the knee was performed. No intravenous contrast was administered. COMPARISON:  None. FINDINGS: MENISCI Medial meniscus: Radial tear of the posterior horn and body of the medial meniscus. Lateral meniscus:  Intact. LIGAMENTS Cruciates:  Intact ACL and PCL. Collaterals: Medial collateral ligament is intact. Lateral collateral ligament complex is intact. CARTILAGE Patellofemoral: Partial-thickness cartilage loss of the patellofemoral compartment. Medial: Extensive full-thickness cartilage loss of the medial femorotibial compartment with marginal osteophytes and mild subchondral reactive marrow changes. Lateral: Partial-thickness cartilage loss of the lateral femorotibial compartment. Joint: Large joint effusion. Mild edema in Hoffa's fat. No plical thickening. Popliteal Fossa:  Tiny Baker's cyst.  Intact popliteus tendon. Extensor Mechanism: Intact quadriceps tendon. Intact patellar tendon. Intact medial patellar retinaculum. Intact lateral patellar retinaculum. Intact MPFL. Bones: No other focal marrow signal abnormality. No fracture or dislocation. Other: No fluid collection or hematoma.  Muscles are normal. IMPRESSION: 1. Radial tear of the posterior horn and body of the medial meniscus. 2. Tricompartmental cartilage abnormalities as described above. Electronically Signed   By: Kathreen Devoid   On: 05/17/2017 12:54   Dg Knee Right Port  Result Date: 06/11/2017 CLINICAL DATA:  Partial knee replacement. EXAM: PORTABLE RIGHT KNEE - 1-2 VIEW COMPARISON:  MRI 05/17/2017 FINDINGS: Medial  compartment hemiarthroplasty. Hardware intact. Anatomic alignment. No acute bony abnormality. IMPRESSION: Medial compartment hemiarthroplasty.  Anatomic alignment. Electronically Signed   By: Marcello Moores  Register   On: 06/11/2017 10:46   US Breast Ltd Uni Right Inc Axilla  Result Date: 06/07/2017 CLINICAL DATA:  Right upper breast, axillary asymmetry seen on most recent screening mammography. EXAM: DIGITAL DIAGNOSTIC RIGHT MAMMOGRAM WITH CAD AND TOMO ULTRASOUND RIGHT BREAST COMPARISON:  Previous exam(s). ACR Breast Density Category c: The breast tissue is heterogeneously dense, which may obscure small masses. FINDINGS: Additional mammographic views of the right breast and right axilla demonstrates persistent superficially located asymmetry in the right axilla. BB marker was placed on visible skin papule he, and the asymmetry of question corresponds to it. Mammographic images were processed with CAD. On physical exam, there is a skin colored papule, which per patient's report, has bean draining fluid. Targeted ultrasound is performed, showing intradermal fluid collection versus mass in the right axilla, which measures 2.1 by 1.7 by 5 mm, and corresponds to the palpable papule, seen on the mammogram. IMPRESSION: Right axillary asymmetry corresponds to a skin nodule versus phlegmonous collection. No mammographic sonographic evidence of malignancy in the right breast or right axilla. RECOMMENDATION: Dermatology consult for the skin nodule in the right axilla is recommended. Otherwise, screening mammogram in one year.(Code:SM-B-01Y) I have discussed the findings and recommendations with the patient. Results were also provided in writing at the conclusion of the visit. If applicable, a reminder letter will be sent to the patient regarding the next appointment. BI-RADS CATEGORY  2: Benign. Electronically Signed   By: Fidela Salisbury M.D.   On: 06/07/2017 13:41   Mm Diag Breast Tomo Uni Right  Result Date:  06/07/2017 CLINICAL DATA:  Right upper breast, axillary asymmetry seen on most recent screening mammography. EXAM: DIGITAL DIAGNOSTIC RIGHT MAMMOGRAM WITH CAD AND TOMO ULTRASOUND RIGHT BREAST COMPARISON:  Previous exam(s). ACR Breast Density Category c: The breast tissue is heterogeneously dense, which may obscure small masses. FINDINGS: Additional mammographic views of the right breast and right axilla demonstrates persistent superficially located asymmetry in the right axilla. BB marker was placed on visible skin papule he, and the asymmetry of question corresponds to it. Mammographic images were processed with CAD. On physical exam, there is a skin colored papule, which per patient's report, has bean draining fluid. Targeted ultrasound is performed, showing intradermal fluid collection versus mass in the right axilla, which measures 2.1 by  1.7 by 5 mm, and corresponds to the palpable papule, seen on the mammogram. IMPRESSION: Right axillary asymmetry corresponds to a skin nodule versus phlegmonous collection. No mammographic sonographic evidence of malignancy in the right breast or right axilla. RECOMMENDATION: Dermatology consult for the skin nodule in the right axilla is recommended. Otherwise, screening mammogram in one year.(Code:SM-B-01Y) I have discussed the findings and recommendations with the patient. Results were also provided in writing at the conclusion of the visit. If applicable, a reminder letter will be sent to the patient regarding the next appointment. BI-RADS CATEGORY  2: Benign. Electronically Signed   By: Fidela Salisbury M.D.   On: 06/07/2017 13:41   Mm Screening Breast Tomo Bilateral  Result Date: 05/23/2017 CLINICAL DATA:  Screening. EXAM: DIGITAL SCREENING BILATERAL MAMMOGRAM WITH TOMO AND CAD COMPARISON:  Previous exam(s). ACR Breast Density Category c: The breast tissue is heterogeneously dense, which may obscure small masses. FINDINGS: In the right breast, a possible asymmetry  warrants further evaluation. In the left breast, no findings suspicious for malignancy. Images were processed with CAD. IMPRESSION: Further evaluation is suggested for possible asymmetry in the right breast. RECOMMENDATION: Diagnostic mammogram and possibly ultrasound of the right breast. (Code:[id number]) The patient will be contacted regarding the findings, and additional imaging will be scheduled. BI-RADS CATEGORY  0: Incomplete. Need additional imaging evaluation and/or prior mammograms for comparison. Electronically Signed   By: Ammie Ferrier M.D.   On: 05/23/2017 15:23   Disposition: Plan will be for discharge home this afternoon.  Follow-up Information    Lattie Corns, PA-C Follow up in 14 day(s).   Specialty:  Physician Assistant Why:  Electa Sniff information: [place] [id number] [phone]          Signed: Judson Roch PA-C 06/12/2017, 12:58 PM

## 2017-06-13 DIAGNOSIS — Z7982 Long term (current) use of aspirin: Secondary | ICD-10-CM | POA: Diagnosis not present

## 2017-06-13 DIAGNOSIS — Z9181 History of falling: Secondary | ICD-10-CM | POA: Diagnosis not present

## 2017-06-13 DIAGNOSIS — Z471 Aftercare following joint replacement surgery: Secondary | ICD-10-CM | POA: Diagnosis not present

## 2017-06-13 DIAGNOSIS — N183 Chronic kidney disease, stage 3 (moderate): Secondary | ICD-10-CM | POA: Diagnosis not present

## 2017-06-13 DIAGNOSIS — I130 Hypertensive heart and chronic kidney disease with heart failure and stage 1 through stage 4 chronic kidney disease, or unspecified chronic kidney disease: Secondary | ICD-10-CM | POA: Diagnosis not present

## 2017-06-13 DIAGNOSIS — I509 Heart failure, unspecified: Secondary | ICD-10-CM | POA: Diagnosis not present

## 2017-06-13 DIAGNOSIS — Z96653 Presence of artificial knee joint, bilateral: Secondary | ICD-10-CM | POA: Diagnosis not present

## 2017-06-13 DIAGNOSIS — M109 Gout, unspecified: Secondary | ICD-10-CM | POA: Diagnosis not present

## 2017-06-13 DIAGNOSIS — E785 Hyperlipidemia, unspecified: Secondary | ICD-10-CM | POA: Diagnosis not present

## 2017-06-14 DIAGNOSIS — E785 Hyperlipidemia, unspecified: Secondary | ICD-10-CM | POA: Diagnosis not present

## 2017-06-14 DIAGNOSIS — M109 Gout, unspecified: Secondary | ICD-10-CM | POA: Diagnosis not present

## 2017-06-14 DIAGNOSIS — N183 Chronic kidney disease, stage 3 (moderate): Secondary | ICD-10-CM | POA: Diagnosis not present

## 2017-06-14 DIAGNOSIS — Z471 Aftercare following joint replacement surgery: Secondary | ICD-10-CM | POA: Diagnosis not present

## 2017-06-14 DIAGNOSIS — I509 Heart failure, unspecified: Secondary | ICD-10-CM | POA: Diagnosis not present

## 2017-06-14 DIAGNOSIS — Z7982 Long term (current) use of aspirin: Secondary | ICD-10-CM | POA: Diagnosis not present

## 2017-06-14 DIAGNOSIS — I130 Hypertensive heart and chronic kidney disease with heart failure and stage 1 through stage 4 chronic kidney disease, or unspecified chronic kidney disease: Secondary | ICD-10-CM | POA: Diagnosis not present

## 2017-06-14 DIAGNOSIS — Z96653 Presence of artificial knee joint, bilateral: Secondary | ICD-10-CM | POA: Diagnosis not present

## 2017-06-14 DIAGNOSIS — Z9181 History of falling: Secondary | ICD-10-CM | POA: Diagnosis not present

## 2017-06-17 DIAGNOSIS — E785 Hyperlipidemia, unspecified: Secondary | ICD-10-CM | POA: Diagnosis not present

## 2017-06-17 DIAGNOSIS — Z7982 Long term (current) use of aspirin: Secondary | ICD-10-CM | POA: Diagnosis not present

## 2017-06-17 DIAGNOSIS — I130 Hypertensive heart and chronic kidney disease with heart failure and stage 1 through stage 4 chronic kidney disease, or unspecified chronic kidney disease: Secondary | ICD-10-CM | POA: Diagnosis not present

## 2017-06-17 DIAGNOSIS — Z471 Aftercare following joint replacement surgery: Secondary | ICD-10-CM | POA: Diagnosis not present

## 2017-06-17 DIAGNOSIS — I509 Heart failure, unspecified: Secondary | ICD-10-CM | POA: Diagnosis not present

## 2017-06-17 DIAGNOSIS — Z96653 Presence of artificial knee joint, bilateral: Secondary | ICD-10-CM | POA: Diagnosis not present

## 2017-06-17 DIAGNOSIS — N183 Chronic kidney disease, stage 3 (moderate): Secondary | ICD-10-CM | POA: Diagnosis not present

## 2017-06-17 DIAGNOSIS — Z9181 History of falling: Secondary | ICD-10-CM | POA: Diagnosis not present

## 2017-06-17 DIAGNOSIS — M109 Gout, unspecified: Secondary | ICD-10-CM | POA: Diagnosis not present

## 2017-06-18 ENCOUNTER — Telehealth: Payer: Self-pay | Admitting: Licensed Clinical Social Worker

## 2017-06-18 NOTE — Telephone Encounter (Signed)
EMMI flagged patient for answering yes to feeling sad/hopeless/anxious/empty. Clinical Education officer, museum (CSW) contacted patient via telephone. Patient reported that she is doing good and is going to see Rutha Bouchard PA for her follow up and staple removal on 06/26/17. Per patient [facility] PT is coming out to see her and is making progress. Patient reported that she is able to walk a little bit without the walker now. Per patient she is not feeling sad or depressed and did not mean to answer yes to the question. Patient reported no other needs or concerns. No future call is needed.   [employer], LCSW [phone]

## 2017-06-19 DIAGNOSIS — I130 Hypertensive heart and chronic kidney disease with heart failure and stage 1 through stage 4 chronic kidney disease, or unspecified chronic kidney disease: Secondary | ICD-10-CM | POA: Diagnosis not present

## 2017-06-19 DIAGNOSIS — Z96653 Presence of artificial knee joint, bilateral: Secondary | ICD-10-CM | POA: Diagnosis not present

## 2017-06-19 DIAGNOSIS — E785 Hyperlipidemia, unspecified: Secondary | ICD-10-CM | POA: Diagnosis not present

## 2017-06-19 DIAGNOSIS — Z7982 Long term (current) use of aspirin: Secondary | ICD-10-CM | POA: Diagnosis not present

## 2017-06-19 DIAGNOSIS — N183 Chronic kidney disease, stage 3 (moderate): Secondary | ICD-10-CM | POA: Diagnosis not present

## 2017-06-19 DIAGNOSIS — Z471 Aftercare following joint replacement surgery: Secondary | ICD-10-CM | POA: Diagnosis not present

## 2017-06-19 DIAGNOSIS — Z9181 History of falling: Secondary | ICD-10-CM | POA: Diagnosis not present

## 2017-06-19 DIAGNOSIS — M109 Gout, unspecified: Secondary | ICD-10-CM | POA: Diagnosis not present

## 2017-06-19 DIAGNOSIS — I509 Heart failure, unspecified: Secondary | ICD-10-CM | POA: Diagnosis not present

## 2017-06-21 DIAGNOSIS — M109 Gout, unspecified: Secondary | ICD-10-CM | POA: Diagnosis not present

## 2017-06-21 DIAGNOSIS — E785 Hyperlipidemia, unspecified: Secondary | ICD-10-CM | POA: Diagnosis not present

## 2017-06-21 DIAGNOSIS — Z7982 Long term (current) use of aspirin: Secondary | ICD-10-CM | POA: Diagnosis not present

## 2017-06-21 DIAGNOSIS — I509 Heart failure, unspecified: Secondary | ICD-10-CM | POA: Diagnosis not present

## 2017-06-21 DIAGNOSIS — Z96653 Presence of artificial knee joint, bilateral: Secondary | ICD-10-CM | POA: Diagnosis not present

## 2017-06-21 DIAGNOSIS — I130 Hypertensive heart and chronic kidney disease with heart failure and stage 1 through stage 4 chronic kidney disease, or unspecified chronic kidney disease: Secondary | ICD-10-CM | POA: Diagnosis not present

## 2017-06-21 DIAGNOSIS — N183 Chronic kidney disease, stage 3 (moderate): Secondary | ICD-10-CM | POA: Diagnosis not present

## 2017-06-21 DIAGNOSIS — Z471 Aftercare following joint replacement surgery: Secondary | ICD-10-CM | POA: Diagnosis not present

## 2017-06-21 DIAGNOSIS — Z9181 History of falling: Secondary | ICD-10-CM | POA: Diagnosis not present

## 2017-06-25 DIAGNOSIS — I130 Hypertensive heart and chronic kidney disease with heart failure and stage 1 through stage 4 chronic kidney disease, or unspecified chronic kidney disease: Secondary | ICD-10-CM | POA: Diagnosis not present

## 2017-06-25 DIAGNOSIS — Z96653 Presence of artificial knee joint, bilateral: Secondary | ICD-10-CM | POA: Diagnosis not present

## 2017-06-25 DIAGNOSIS — Z7982 Long term (current) use of aspirin: Secondary | ICD-10-CM | POA: Diagnosis not present

## 2017-06-25 DIAGNOSIS — Z9181 History of falling: Secondary | ICD-10-CM | POA: Diagnosis not present

## 2017-06-25 DIAGNOSIS — Z471 Aftercare following joint replacement surgery: Secondary | ICD-10-CM | POA: Diagnosis not present

## 2017-06-25 DIAGNOSIS — M109 Gout, unspecified: Secondary | ICD-10-CM | POA: Diagnosis not present

## 2017-06-25 DIAGNOSIS — E785 Hyperlipidemia, unspecified: Secondary | ICD-10-CM | POA: Diagnosis not present

## 2017-06-25 DIAGNOSIS — N183 Chronic kidney disease, stage 3 (moderate): Secondary | ICD-10-CM | POA: Diagnosis not present

## 2017-06-25 DIAGNOSIS — I509 Heart failure, unspecified: Secondary | ICD-10-CM | POA: Diagnosis not present

## 2017-06-26 DIAGNOSIS — Z96651 Presence of right artificial knee joint: Secondary | ICD-10-CM | POA: Diagnosis not present

## 2017-06-26 DIAGNOSIS — R262 Difficulty in walking, not elsewhere classified: Secondary | ICD-10-CM | POA: Diagnosis not present

## 2017-06-26 DIAGNOSIS — M6281 Muscle weakness (generalized): Secondary | ICD-10-CM | POA: Diagnosis not present

## 2017-07-04 DIAGNOSIS — Z471 Aftercare following joint replacement surgery: Secondary | ICD-10-CM | POA: Diagnosis not present

## 2017-07-15 DIAGNOSIS — Z471 Aftercare following joint replacement surgery: Secondary | ICD-10-CM | POA: Diagnosis not present

## 2017-08-19 DIAGNOSIS — Z96651 Presence of right artificial knee joint: Secondary | ICD-10-CM | POA: Diagnosis not present

## 2017-10-22 DIAGNOSIS — B182 Chronic viral hepatitis C: Secondary | ICD-10-CM | POA: Insufficient documentation

## 2017-11-27 DIAGNOSIS — I119 Hypertensive heart disease without heart failure: Secondary | ICD-10-CM | POA: Diagnosis not present

## 2017-11-27 DIAGNOSIS — Z0000 Encounter for general adult medical examination without abnormal findings: Secondary | ICD-10-CM | POA: Diagnosis not present

## 2017-11-27 DIAGNOSIS — N183 Chronic kidney disease, stage 3 (moderate): Secondary | ICD-10-CM | POA: Diagnosis not present

## 2017-11-27 DIAGNOSIS — I25119 Atherosclerotic heart disease of native coronary artery with unspecified angina pectoris: Secondary | ICD-10-CM | POA: Diagnosis not present

## 2018-06-13 DIAGNOSIS — N183 Chronic kidney disease, stage 3 (moderate): Secondary | ICD-10-CM | POA: Diagnosis not present

## 2018-06-13 DIAGNOSIS — E785 Hyperlipidemia, unspecified: Secondary | ICD-10-CM | POA: Diagnosis not present

## 2018-06-13 DIAGNOSIS — F17200 Nicotine dependence, unspecified, uncomplicated: Secondary | ICD-10-CM | POA: Diagnosis not present

## 2018-06-13 DIAGNOSIS — M1711 Unilateral primary osteoarthritis, right knee: Secondary | ICD-10-CM | POA: Diagnosis not present

## 2018-06-13 DIAGNOSIS — Z79899 Other long term (current) drug therapy: Secondary | ICD-10-CM | POA: Diagnosis not present

## 2018-06-13 DIAGNOSIS — I130 Hypertensive heart and chronic kidney disease with heart failure and stage 1 through stage 4 chronic kidney disease, or unspecified chronic kidney disease: Secondary | ICD-10-CM | POA: Diagnosis not present

## 2018-06-13 DIAGNOSIS — M109 Gout, unspecified: Secondary | ICD-10-CM | POA: Diagnosis not present

## 2018-06-13 DIAGNOSIS — I509 Heart failure, unspecified: Secondary | ICD-10-CM | POA: Diagnosis not present

## 2018-06-17 DIAGNOSIS — I25119 Atherosclerotic heart disease of native coronary artery with unspecified angina pectoris: Secondary | ICD-10-CM | POA: Diagnosis not present

## 2018-06-26 DIAGNOSIS — I25119 Atherosclerotic heart disease of native coronary artery with unspecified angina pectoris: Secondary | ICD-10-CM | POA: Diagnosis not present

## 2018-06-26 DIAGNOSIS — B182 Chronic viral hepatitis C: Secondary | ICD-10-CM | POA: Diagnosis not present

## 2018-06-26 DIAGNOSIS — I509 Heart failure, unspecified: Secondary | ICD-10-CM | POA: Diagnosis not present

## 2018-06-26 DIAGNOSIS — Z0000 Encounter for general adult medical examination without abnormal findings: Secondary | ICD-10-CM | POA: Diagnosis not present

## 2018-06-26 DIAGNOSIS — N184 Chronic kidney disease, stage 4 (severe): Secondary | ICD-10-CM | POA: Diagnosis not present

## 2018-08-21 DIAGNOSIS — I129 Hypertensive chronic kidney disease with stage 1 through stage 4 chronic kidney disease, or unspecified chronic kidney disease: Secondary | ICD-10-CM | POA: Diagnosis not present

## 2018-08-21 DIAGNOSIS — N184 Chronic kidney disease, stage 4 (severe): Secondary | ICD-10-CM | POA: Diagnosis not present

## 2018-08-21 DIAGNOSIS — D649 Anemia, unspecified: Secondary | ICD-10-CM | POA: Diagnosis not present

## 2018-09-03 ENCOUNTER — Other Ambulatory Visit: Payer: Self-pay | Admitting: Nephrology

## 2018-09-03 DIAGNOSIS — R319 Hematuria, unspecified: Secondary | ICD-10-CM

## 2018-09-03 DIAGNOSIS — N184 Chronic kidney disease, stage 4 (severe): Secondary | ICD-10-CM

## 2018-09-15 ENCOUNTER — Other Ambulatory Visit: Payer: Self-pay | Admitting: Student

## 2018-09-15 DIAGNOSIS — Z800 Family history of malignant neoplasm of digestive organs: Secondary | ICD-10-CM | POA: Diagnosis not present

## 2018-09-15 DIAGNOSIS — B182 Chronic viral hepatitis C: Secondary | ICD-10-CM

## 2018-09-15 DIAGNOSIS — R768 Other specified abnormal immunological findings in serum: Secondary | ICD-10-CM | POA: Diagnosis not present

## 2018-09-16 ENCOUNTER — Ambulatory Visit: Payer: Medicare HMO

## 2018-09-16 DIAGNOSIS — Z114 Encounter for screening for human immunodeficiency virus [HIV]: Secondary | ICD-10-CM | POA: Diagnosis not present

## 2018-09-16 DIAGNOSIS — R768 Other specified abnormal immunological findings in serum: Secondary | ICD-10-CM | POA: Diagnosis not present

## 2018-09-16 DIAGNOSIS — B182 Chronic viral hepatitis C: Secondary | ICD-10-CM | POA: Diagnosis not present

## 2018-10-16 ENCOUNTER — Other Ambulatory Visit: Payer: Self-pay | Admitting: Student

## 2018-10-16 DIAGNOSIS — B182 Chronic viral hepatitis C: Secondary | ICD-10-CM

## 2018-10-16 DIAGNOSIS — N179 Acute kidney failure, unspecified: Secondary | ICD-10-CM

## 2018-10-17 ENCOUNTER — Ambulatory Visit: Admission: RE | Admit: 2018-10-17 | Payer: Medicare HMO | Source: Ambulatory Visit

## 2018-10-17 ENCOUNTER — Ambulatory Visit
Admission: RE | Admit: 2018-10-17 | Discharge: 2018-10-17 | Disposition: A | Discharge status: Home / Self Care | Payer: Medicare HMO | Source: Ambulatory Visit | Attending: Student | Admitting: Student

## 2018-10-17 ENCOUNTER — Other Ambulatory Visit: Payer: Self-pay

## 2018-10-17 DIAGNOSIS — N179 Acute kidney failure, unspecified: Secondary | ICD-10-CM | POA: Diagnosis not present

## 2018-10-17 DIAGNOSIS — B182 Chronic viral hepatitis C: Secondary | ICD-10-CM | POA: Insufficient documentation

## 2018-10-17 DIAGNOSIS — K8020 Calculus of gallbladder without cholecystitis without obstruction: Secondary | ICD-10-CM | POA: Diagnosis not present

## 2018-12-17 DIAGNOSIS — I10 Essential (primary) hypertension: Secondary | ICD-10-CM | POA: Diagnosis not present

## 2018-12-17 DIAGNOSIS — N184 Chronic kidney disease, stage 4 (severe): Secondary | ICD-10-CM | POA: Diagnosis not present

## 2018-12-17 DIAGNOSIS — N2581 Secondary hyperparathyroidism of renal origin: Secondary | ICD-10-CM | POA: Diagnosis not present

## 2018-12-17 DIAGNOSIS — R801 Persistent proteinuria, unspecified: Secondary | ICD-10-CM | POA: Diagnosis not present

## 2018-12-17 DIAGNOSIS — B182 Chronic viral hepatitis C: Secondary | ICD-10-CM | POA: Diagnosis not present

## 2018-12-29 DIAGNOSIS — I25119 Atherosclerotic heart disease of native coronary artery with unspecified angina pectoris: Secondary | ICD-10-CM | POA: Diagnosis not present

## 2018-12-29 DIAGNOSIS — I517 Cardiomegaly: Secondary | ICD-10-CM | POA: Diagnosis not present

## 2018-12-29 DIAGNOSIS — F17210 Nicotine dependence, cigarettes, uncomplicated: Secondary | ICD-10-CM | POA: Diagnosis not present

## 2018-12-29 DIAGNOSIS — B182 Chronic viral hepatitis C: Secondary | ICD-10-CM | POA: Diagnosis not present

## 2018-12-29 DIAGNOSIS — I1310 Hypertensive heart and chronic kidney disease without heart failure, with stage 1 through stage 4 chronic kidney disease, or unspecified chronic kidney disease: Secondary | ICD-10-CM | POA: Diagnosis not present

## 2018-12-29 DIAGNOSIS — N184 Chronic kidney disease, stage 4 (severe): Secondary | ICD-10-CM | POA: Diagnosis not present

## 2019-01-12 DIAGNOSIS — B182 Chronic viral hepatitis C: Secondary | ICD-10-CM | POA: Diagnosis not present

## 2019-01-12 DIAGNOSIS — Z79899 Other long term (current) drug therapy: Secondary | ICD-10-CM | POA: Diagnosis not present

## 2019-01-26 DIAGNOSIS — Z79899 Other long term (current) drug therapy: Secondary | ICD-10-CM | POA: Diagnosis not present

## 2019-01-26 DIAGNOSIS — Z800 Family history of malignant neoplasm of digestive organs: Secondary | ICD-10-CM | POA: Diagnosis not present

## 2019-01-26 DIAGNOSIS — K7400 Hepatic fibrosis, unspecified: Secondary | ICD-10-CM | POA: Diagnosis not present

## 2019-01-26 DIAGNOSIS — B182 Chronic viral hepatitis C: Secondary | ICD-10-CM | POA: Diagnosis not present

## 2019-02-16 ENCOUNTER — Ambulatory Visit (INDEPENDENT_AMBULATORY_CARE_PROVIDER_SITE_OTHER): Payer: Medicare HMO | Admitting: Nurse Practitioner

## 2019-02-23 DIAGNOSIS — Z79899 Other long term (current) drug therapy: Secondary | ICD-10-CM | POA: Diagnosis not present

## 2019-02-23 DIAGNOSIS — B182 Chronic viral hepatitis C: Secondary | ICD-10-CM | POA: Diagnosis not present

## 2019-03-19 DIAGNOSIS — E559 Vitamin D deficiency, unspecified: Secondary | ICD-10-CM | POA: Diagnosis not present

## 2019-03-23 DIAGNOSIS — Z79899 Other long term (current) drug therapy: Secondary | ICD-10-CM | POA: Diagnosis not present

## 2019-03-23 DIAGNOSIS — E559 Vitamin D deficiency, unspecified: Secondary | ICD-10-CM | POA: Diagnosis not present

## 2019-03-23 DIAGNOSIS — B182 Chronic viral hepatitis C: Secondary | ICD-10-CM | POA: Diagnosis not present

## 2019-06-04 DIAGNOSIS — N185 Chronic kidney disease, stage 5: Secondary | ICD-10-CM | POA: Diagnosis not present

## 2019-06-04 DIAGNOSIS — R809 Proteinuria, unspecified: Secondary | ICD-10-CM | POA: Diagnosis not present

## 2019-06-04 DIAGNOSIS — N2581 Secondary hyperparathyroidism of renal origin: Secondary | ICD-10-CM | POA: Diagnosis not present

## 2019-06-04 DIAGNOSIS — B182 Chronic viral hepatitis C: Secondary | ICD-10-CM | POA: Diagnosis not present

## 2019-06-04 DIAGNOSIS — I10 Essential (primary) hypertension: Secondary | ICD-10-CM | POA: Diagnosis not present

## 2019-06-15 DIAGNOSIS — K7400 Hepatic fibrosis, unspecified: Secondary | ICD-10-CM | POA: Diagnosis not present

## 2019-06-15 DIAGNOSIS — N2581 Secondary hyperparathyroidism of renal origin: Secondary | ICD-10-CM | POA: Diagnosis not present

## 2019-06-15 DIAGNOSIS — B182 Chronic viral hepatitis C: Secondary | ICD-10-CM | POA: Diagnosis not present

## 2019-06-15 DIAGNOSIS — Z800 Family history of malignant neoplasm of digestive organs: Secondary | ICD-10-CM | POA: Diagnosis not present

## 2019-06-29 DIAGNOSIS — I10 Essential (primary) hypertension: Secondary | ICD-10-CM | POA: Diagnosis not present

## 2019-06-29 DIAGNOSIS — B182 Chronic viral hepatitis C: Secondary | ICD-10-CM | POA: Diagnosis not present

## 2019-06-29 DIAGNOSIS — R809 Proteinuria, unspecified: Secondary | ICD-10-CM | POA: Diagnosis not present

## 2019-06-29 DIAGNOSIS — F17210 Nicotine dependence, cigarettes, uncomplicated: Secondary | ICD-10-CM | POA: Diagnosis not present

## 2019-06-29 DIAGNOSIS — Z0000 Encounter for general adult medical examination without abnormal findings: Secondary | ICD-10-CM | POA: Diagnosis not present

## 2019-06-29 DIAGNOSIS — R600 Localized edema: Secondary | ICD-10-CM | POA: Diagnosis not present

## 2019-06-29 DIAGNOSIS — N2581 Secondary hyperparathyroidism of renal origin: Secondary | ICD-10-CM | POA: Diagnosis not present

## 2019-06-29 DIAGNOSIS — I25119 Atherosclerotic heart disease of native coronary artery with unspecified angina pectoris: Secondary | ICD-10-CM | POA: Diagnosis not present

## 2019-06-29 DIAGNOSIS — N185 Chronic kidney disease, stage 5: Secondary | ICD-10-CM | POA: Diagnosis not present

## 2019-07-15 ENCOUNTER — Ambulatory Visit: Payer: Self-pay | Admitting: Surgery

## 2019-07-16 DIAGNOSIS — N185 Chronic kidney disease, stage 5: Secondary | ICD-10-CM | POA: Diagnosis not present

## 2019-07-23 DIAGNOSIS — R600 Localized edema: Secondary | ICD-10-CM | POA: Diagnosis not present

## 2019-07-23 DIAGNOSIS — R809 Proteinuria, unspecified: Secondary | ICD-10-CM | POA: Diagnosis not present

## 2019-07-23 DIAGNOSIS — I10 Essential (primary) hypertension: Secondary | ICD-10-CM | POA: Diagnosis not present

## 2019-07-23 DIAGNOSIS — N2581 Secondary hyperparathyroidism of renal origin: Secondary | ICD-10-CM | POA: Diagnosis not present

## 2019-07-23 DIAGNOSIS — B182 Chronic viral hepatitis C: Secondary | ICD-10-CM | POA: Diagnosis not present

## 2019-07-23 DIAGNOSIS — N185 Chronic kidney disease, stage 5: Secondary | ICD-10-CM | POA: Diagnosis not present

## 2019-07-27 ENCOUNTER — Ambulatory Visit (INDEPENDENT_AMBULATORY_CARE_PROVIDER_SITE_OTHER): Payer: Medicare HMO | Admitting: Surgery

## 2019-07-27 ENCOUNTER — Encounter: Payer: Self-pay | Admitting: Surgery

## 2019-07-27 ENCOUNTER — Other Ambulatory Visit: Payer: Self-pay

## 2019-07-27 ENCOUNTER — Telehealth: Payer: Self-pay | Admitting: Surgery

## 2019-07-27 VITALS — BP 135/77 | HR 76 | Temp 97.7°F | Resp 12 | Ht 64.5 in | Wt 201.4 lb

## 2019-07-27 DIAGNOSIS — N186 End stage renal disease: Secondary | ICD-10-CM

## 2019-07-27 NOTE — Telephone Encounter (Signed)
Pt has been advised of Pre-Admission date/time, COVID Testing date and Surgery date.  Surgery Date: 08/12/19 Preadmission Testing Date: 08/05/19 (phone 8a-1p) Covid Testing Date: 08/10/19 - patient advised to go to the [facility] ([place]) between 8a-1p  Patient has been made aware to call [phone], between 1-3:00pm the day before surgery, to find out what time to arrive for surgery.

## 2019-07-27 NOTE — Patient Instructions (Addendum)
Our surgery scheduler will contact you to schedule your surgery. Please have the BLUE SHEET available when she calls you.   Call the office if you have any questions or concerns.  Peritoneal Dialysis Catheter Placement  Peritoneal dialysis catheter placement is a surgery to insert a thin, flexible tube (catheter) into the abdomen. The catheter will be used for peritoneal dialysis, which is a process for filtering the blood. The catheter will be small, soft, and easy to conceal. The catheter placement is usually done at least 2 weeks before peritoneal dialysis is started. During dialysis, wastes, salt, and extra water are removed from the blood. In peritoneal dialysis, these tasks are performed by transferring a fluid (dialysate) to and from the abdomen during each session. The fluid goes through the catheter to enter the abdomen at the start of each dialysis session, and it drains out of the body through the catheter at the end of each session. This procedure will be done using one of the following techniques:  Open technique. This is when the surgery is performed through one incision.  Laparoscopic technique. This is when smaller incisions are made and a tube with a light and camera on the end (laparoscope) is inserted through one of the incisions to help perform the surgery. The camera sends images to a video screen in the operating room. This lets the surgeon see inside the abdomen during the procedure. Tell a health care provider about:  Any allergies you have.  All medicines you are taking, including vitamins, herbs, eye drops, creams, and over-the-counter medicines.  Any problems you or family members have had with anesthetic medicines.  Any blood disorders you have.  Any surgeries you have had.  Any history of smoking.  Any medical conditions you have.  Whether you are pregnant or may be pregnant. What are the risks? Generally, this is a safe procedure. However, problems may  occur, including:  Infection.  Too much bleeding.  A collection of blood near the incision (hematoma).  Damage to blood vessels, tissues, or organs in the abdomen area.  Allergic reactions to medicines.  Pain or cramping.  Slow healing.  Catheter problems after the surgery, such as: ? The catheter becoming blocked. ? The catheter moving out of place. ? The catheter poking into or wrapping around intestines. ? Fluid leaking around the catheter.  Scarring.  Skin damage. What happens before the procedure? Medicines Ask your health care provider about:  Changing or stopping your regular medicines. This is especially important if you take diabetes medicines or blood thinners.  Taking medicines such as aspirin and ibuprofen. These medicines can thin your blood. Do not take these medicines before your procedure if your health care provider tells you not to. Staying hydrated Follow instructions from your health care provider about hydration, which may include:  Up to 2 hours before the procedure - you may continue to drink clear liquids, such as water, clear fruit juice, black coffee, and plain tea. Eating and drinking restrictions Follow instructions from your health care provider about eating and drinking, which may include:  8 hours before the procedure - stop eating heavy meals or foods such as meat, fried foods, or fatty foods.  6 hours before the procedure - stop eating light meals or foods, such as toast or cereal.  6 hours before the procedure - stop drinking milk or drinks that contain milk.  2 hours before the procedure - stop drinking clear liquids. General instructions  Ask your health care  provider how your catheter site will be marked or identified. Your health care provider will discuss the best site for the catheter to be placed. The site will be chosen to help prevent the catheter from being flattened or damaged, and to make it as comfortable as possible for  you.  You may be asked to shower with a germ-killing soap.  You may have a CT scan of your abdomen.  You may have a blood sample taken.  Plan to have someone take you home from the hospital or clinic. What happens during the procedure?  To lower your risk of infection: ? Your health care team will wash or sanitize their hands. ? Your skin will be washed with soap. ? Hair may be removed from the surgical area.  An IV will be inserted into one of your veins.  You may be given antibiotic medicine through your IV.  You will be given a medicine to make you fall asleep (general anesthetic).  If you are having an open surgery, one larger incision will be made in the abdomen.  If you are having laparoscopic surgery, a laparoscope and instruments will be put through small incisions in the abdomen.  The catheter will be put in place.  A short tunnel will be made under the skin to a location where the catheter exits the abdomen.  Stitches (sutures) will be placed around the catheter to hold it in place.  Your incisions will be closed with sutures or staples. The procedure may vary among health care providers and hospitals. What happens after the procedure?  Your blood pressure, heart rate, breathing rate, and blood oxygen level will be monitored until the medicines you were given have worn off.  You may have some pain. You will be given pain medicine as needed.  You will be given instructions about how to care for your catheter and how it is used for the dialysis process. Summary  Peritoneal dialysis catheter placement is a surgery to insert a thin, flexible tube (catheter) in your abdomen. This surgery must be done before you begin peritoneal dialysis.  Before the procedure, your health care provider will discuss the best site for the catheter to be placed. The site will be chosen to help prevent the catheter from being flattened or damaged, and to make it as comfortable as  possible for you.  After the procedure, you will be given instructions about how to care for your catheter and how it is used for the dialysis process. This information is not intended to replace advice given to you by your health care provider. Make sure you discuss any questions you have with your health care provider. Document Revised: 05/22/2018 Document Reviewed: 03/09/2016 Elsevier Patient Education  [place].

## 2019-07-29 NOTE — Progress Notes (Signed)
Patient ID: Anna Key, female   DOB: [birth year], 69 y.o.   MRN: [id number]  HPI Anna Key is a 69 y.o. female seen in consultation at the request of Dr. Candiss Key. She  Has a hx of CHF, CKD, hepatitis C treated. She is approaching dialysis and is interested in PD catheter placement. She is very functional and is able to perform more than 4 METS w/o Sob or C/P. Latest labs reviewed. Creat 3.3 hb 12, rest of labs wnl.  No major prior abd operations. U/S pers. Reviewed showing Gallstones but no other acute issues other than chronic kidney issues. Had a knee replacement w/o complications.  HPI  Past Medical History:  Diagnosis Date  . Arthritis    osteoarthritis of right knee  . CHF (congestive heart failure) ([place])   . Chronic kidney disease    stage 3  . Gout   . Hepatitis C antibody test positive   . Hyperlipidemia   . Hypertension     Past Surgical History:  Procedure Laterality Date  . CARDIAC CATHETERIZATION    . PARTIAL KNEE ARTHROPLASTY Right 06/11/2017   Procedure: UNICOMPARTMENTAL KNEE;  Surgeon: Anna Mull, MD;  Location: [facility] ORS;  Service: Orthopedics;  Laterality: Right;  . REPLACEMENT TOTAL KNEE Left 2012    Family History  Problem Relation Age of Onset  . Colon cancer Mother     Social History Social History   Tobacco Use  . Smoking status: Current Every Day Smoker    Packs/day: 0.50    Types: Cigarettes  . Smokeless tobacco: Never Used  Vaping Use  . Vaping Use: Never used  Substance Use Topics  . Alcohol use: Never  . Drug use: Not Currently    Comment: 30 years ago    Allergies  Allergen Reactions  . Penicillins Hives and Rash    Has patient had a PCN reaction causing immediate rash, facial/tongue/throat swelling, SOB or lightheadedness with hypotension: Yes Has patient had a PCN reaction causing severe rash involving mucus membranes or skin necrosis: Yes--all over body Has patient had a PCN reaction that required hospitalization:  No  Has patient had a PCN reaction occurring within the last 10 years: No If all of the above answers are "NO", then may proceed with Cephalosporin use.     Current Outpatient Medications  Medication Sig Dispense Refill  . allopurinol (ZYLOPRIM) 100 MG tablet Take 100 mg by mouth daily.    Marland Kitchen amLODipine (NORVASC) 10 MG tablet Take 10 mg by mouth daily.    Marland Kitchen atorvastatin (LIPITOR) 40 MG tablet Take 40 mg by mouth daily.    . isosorbide mononitrate (IMDUR) 60 MG 24 hr tablet Take 60 mg by mouth daily.    . metoprolol (TOPROL-XL) 200 MG 24 hr tablet Take 200 mg by mouth daily.    Marland Kitchen torsemide (DEMADEX) 20 MG tablet Take 40 mg by mouth daily.     Marland Kitchen lisinopril (ZESTRIL) 20 MG tablet Take 20 mg by mouth daily.     No current facility-administered medications for this visit.     Review of Systems Full ROS  was asked and was negative except for the information on the HPI  Physical Exam Blood pressure 135/77, pulse 76, temperature 97.7 F (36.5 C), resp. rate 12, height 5' 4.5" (1.638 m), weight 201 lb 6.4 oz (91.4 kg), SpO2 96 %. CONSTITUTIONAL:NAD EYES: Pupils are equal, round, , Sclera are non-icteric. EARS, NOSE, MOUTH AND THROAT: Wearing a mask.Marland Kitchen Hearing is intact to  voice. LYMPH NODES:  Lymph nodes in the neck are normal. RESPIRATORY:  Lungs are clear. There is normal respiratory effort, with equal breath sounds bilaterally, and without pathologic use of accessory muscles. CARDIOVASCULAR: Heart is regular without murmurs, gallops, or rubs. GI: The abdomen is soft, nontender, and nondistended. There are no palpable masses. There is no hepatosplenomegaly. There are normal bowel sounds in all quadrants. GU: Rectal deferred.   MUSCULOSKELETAL: Normal muscle strength and tone. No cyanosis or edema.   SKIN: Turgor is good and there are no pathologic skin lesions or ulcers. NEUROLOGIC: Motor and sensation is grossly normal. Cranial nerves are grossly intact. PSYCH:  Oriented to person, place  and time. Affect is normal.  Data Reviewed I have personally reviewed the patient's imaging, laboratory findings and medical records.    Assessment/Plan 69 yo female w ESRD interested in PD . There is no contraindication for PD placement and she wishes to proceed. Procedure d/w the pt in detail. Risks, benefits and possible complications including but not limited to bleeding, infection, bowel injuries, malfunction requiring re-intervention. She understands and wishes to proceed. Extensive counseling provided. Plan for PD catheter in the next few weeks.  Anna Hamman, MD FACS General Surgeon 07/29/2019, 3:54 PM

## 2019-07-29 NOTE — H&P (View-Only) (Signed)
Patient ID: Anna Key, female   DOB: 08-Jan-1951, 69 y.o.   MRN: [id number]  HPI Anna Key is a 69 y.o. female seen in consultation at the request of Dr. Candiss Norse. She  Has a hx of CHF, CKD, hepatitis C treated. She is approaching dialysis and is interested in PD catheter placement. She is very functional and is able to perform more than 4 METS w/o Sob or C/P. Latest labs reviewed. Creat 3.3 hb 12, rest of labs wnl.  No major prior abd operations. U/S pers. Reviewed showing Gallstones but no other acute issues other than chronic kidney issues. Had a knee replacement w/o complications.  HPI  Past Medical History:  Diagnosis Date   Arthritis    osteoarthritis of right knee   CHF (congestive heart failure) ([facility])    Chronic kidney disease    stage 3   Gout    Hepatitis C antibody test positive    Hyperlipidemia    Hypertension     Past Surgical History:  Procedure Laterality Date   CARDIAC CATHETERIZATION     PARTIAL KNEE ARTHROPLASTY Right 06/11/2017   Procedure: UNICOMPARTMENTAL KNEE;  Surgeon: Corky Mull, MD;  Location: [facility] ORS;  Service: Orthopedics;  Laterality: Right;   REPLACEMENT TOTAL KNEE Left 2012    Family History  Problem Relation Age of Onset   Colon cancer Mother     Social History Social History   Tobacco Use   Smoking status: Current Every Day Smoker    Packs/day: 0.50    Types: Cigarettes   Smokeless tobacco: Never Used  Vaping Use   Vaping Use: Never used  Substance Use Topics   Alcohol use: Never   Drug use: Not Currently    Comment: 30 years ago    Allergies  Allergen Reactions   Penicillins Hives and Rash    Has patient had a PCN reaction causing immediate rash, facial/tongue/throat swelling, SOB or lightheadedness with hypotension: Yes Has patient had a PCN reaction causing severe rash involving mucus membranes or skin necrosis: Yes--all over body Has patient had a PCN reaction that required hospitalization:  No  Has patient had a PCN reaction occurring within the last 10 years: No If all of the above answers are "NO", then may proceed with Cephalosporin use.     Current Outpatient Medications  Medication Sig Dispense Refill   allopurinol (ZYLOPRIM) 100 MG tablet Take 100 mg by mouth daily.     amLODipine (NORVASC) 10 MG tablet Take 10 mg by mouth daily.     atorvastatin (LIPITOR) 40 MG tablet Take 40 mg by mouth daily.     isosorbide mononitrate (IMDUR) 60 MG 24 hr tablet Take 60 mg by mouth daily.     metoprolol (TOPROL-XL) 200 MG 24 hr tablet Take 200 mg by mouth daily.     torsemide (DEMADEX) 20 MG tablet Take 40 mg by mouth daily.      lisinopril (ZESTRIL) 20 MG tablet Take 20 mg by mouth daily.     No current facility-administered medications for this visit.     Review of Systems Full ROS  was asked and was negative except for the information on the HPI  Physical Exam Blood pressure 135/77, pulse 76, temperature 97.7 F (36.5 C), resp. rate 12, height 5' 4.5" (1.638 m), weight 201 lb 6.4 oz (91.4 kg), SpO2 96 %. CONSTITUTIONAL:NAD EYES: Pupils are equal, round, , Sclera are non-icteric. EARS, NOSE, MOUTH AND THROAT: Wearing a mask.Marland Kitchen Hearing is intact to  voice. LYMPH NODES:  Lymph nodes in the neck are normal. RESPIRATORY:  Lungs are clear. There is normal respiratory effort, with equal breath sounds bilaterally, and without pathologic use of accessory muscles. CARDIOVASCULAR: Heart is regular without murmurs, gallops, or rubs. GI: The abdomen is soft, nontender, and nondistended. There are no palpable masses. There is no hepatosplenomegaly. There are normal bowel sounds in all quadrants. GU: Rectal deferred.   MUSCULOSKELETAL: Normal muscle strength and tone. No cyanosis or edema.   SKIN: Turgor is good and there are no pathologic skin lesions or ulcers. NEUROLOGIC: Motor and sensation is grossly normal. Cranial nerves are grossly intact. PSYCH:  Oriented to person, place  and time. Affect is normal.  Data Reviewed I have personally reviewed the patient's imaging, laboratory findings and medical records.    Assessment/Plan 69 yo female w ESRD interested in PD . There is no contraindication for PD placement and she wishes to proceed. Procedure d/w the pt in detail. Risks, benefits and possible complications including but not limited to bleeding, infection, bowel injuries, malfunction requiring re-intervention. She understands and wishes to proceed. Extensive counseling provided. Plan for PD catheter in the next few weeks.  Caroleen Hamman, MD FACS General Surgeon 07/29/2019, 3:54 PM

## 2019-08-05 ENCOUNTER — Encounter
Admission: RE | Admit: 2019-08-05 | Discharge: 2019-08-05 | Disposition: A | Discharge status: Home / Self Care | Payer: Medicare HMO | Source: Ambulatory Visit | Attending: Surgery | Admitting: Surgery

## 2019-08-05 ENCOUNTER — Other Ambulatory Visit: Payer: Self-pay

## 2019-08-05 DIAGNOSIS — Z01818 Encounter for other preprocedural examination: Secondary | ICD-10-CM | POA: Diagnosis not present

## 2019-08-05 NOTE — Patient Instructions (Signed)
Your procedure is scheduled on: Wednesday 08/12/19.  Report to DAY SURGERY DEPARTMENT LOCATED ON 2ND FLOOR MEDICAL MALL ENTRANCE. To find out your arrival time please call [phone] between 1PM - 3PM on Tuesday 08/11/19.   Remember: Instructions that are not followed completely may result in serious medical risk, up to and including death, or upon the discretion of your surgeon and anesthesiologist your surgery may need to be rescheduled.      _X__ 1. Do not eat food after midnight the night before your procedure.                 No gum chewing or hard candies. You may drink clear liquids up to 2 hours                 before you are scheduled to arrive for your surgery- DO NOT drink clear                 liquids within 2 hours of the start of your surgery.                 Clear Liquids include:  water, apple juice without pulp, clear carbohydrate                 drink such as Clearfast or Gatorade, Black Coffee or Tea (Do not add                 milk, creamer, or dairy).   __X__2.  On the morning of surgery brush your teeth with toothpaste and water, you may rinse your mouth with mouthwash if you wish.  Do not swallow any toothpaste or mouthwash.      _X__ 3.  No Alcohol for 24 hours before or after surgery.    _X__ 4.  Do Not Smoke or use e-cigarettes For 24 Hours Prior to Your Surgery.                 Do not use any chewable tobacco products for at least 6 hours prior to                 surgery.   __X__5.  Notify your doctor if there is any change in your medical condition      (cold, fever, infections).       Do not wear jewelry, make-up, hairpins, clips or nail polish. Do not wear lotions, powders, or perfumes.  Do not shave 48 hours prior to surgery. Men may shave face and neck. Do not bring valuables to the hospital.      [facility] is not responsible for any belongings or valuables.    Contacts, dentures/partials or body piercings may not be worn into surgery.  Bring a case for your contacts, glasses or hearing aids, a denture cup will be supplied.    Patients discharged the day of surgery will not be allowed to drive home.     __X__ Take these medicines the morning of surgery with A SIP OF WATER:     1. allopurinol (ZYLOPRIM)   2. amLODipine (NORVASC)   3. atorvastatin (LIPITOR)   4. isosorbide mononitrate (IMDUR)   5. metoprolol (TOPROL-XL)      __X__ Use CHG Soap as directed    __X__ Stop Anti-inflammatories 7 days before surgery such as Advil, Ibuprofen, Motrin, BC or Goodies Powder, Naprosyn, Naproxen, Aleve, Aspirin, Meloxicam. May take Tylenol if needed for pain or discomfort.     __X__ Don't start taking any new  herbal supplements or vitamins before your procedure.

## 2019-08-07 ENCOUNTER — Other Ambulatory Visit: Payer: Self-pay

## 2019-08-07 ENCOUNTER — Encounter
Admission: RE | Admit: 2019-08-07 | Discharge: 2019-08-07 | Disposition: A | Discharge status: Home / Self Care | Payer: Medicare HMO | Source: Ambulatory Visit | Attending: Surgery | Admitting: Surgery

## 2019-08-07 DIAGNOSIS — I132 Hypertensive heart and chronic kidney disease with heart failure and with stage 5 chronic kidney disease, or end stage renal disease: Secondary | ICD-10-CM | POA: Diagnosis not present

## 2019-08-07 DIAGNOSIS — Z79899 Other long term (current) drug therapy: Secondary | ICD-10-CM | POA: Diagnosis not present

## 2019-08-07 DIAGNOSIS — N186 End stage renal disease: Secondary | ICD-10-CM | POA: Diagnosis not present

## 2019-08-07 DIAGNOSIS — N9489 Other specified conditions associated with female genital organs and menstrual cycle: Secondary | ICD-10-CM | POA: Diagnosis not present

## 2019-08-07 DIAGNOSIS — Z96653 Presence of artificial knee joint, bilateral: Secondary | ICD-10-CM | POA: Diagnosis not present

## 2019-08-07 DIAGNOSIS — F17200 Nicotine dependence, unspecified, uncomplicated: Secondary | ICD-10-CM | POA: Diagnosis not present

## 2019-08-07 DIAGNOSIS — M109 Gout, unspecified: Secondary | ICD-10-CM | POA: Diagnosis not present

## 2019-08-07 DIAGNOSIS — Z01818 Encounter for other preprocedural examination: Secondary | ICD-10-CM | POA: Diagnosis not present

## 2019-08-07 DIAGNOSIS — I10 Essential (primary) hypertension: Secondary | ICD-10-CM | POA: Diagnosis not present

## 2019-08-07 DIAGNOSIS — Z8619 Personal history of other infectious and parasitic diseases: Secondary | ICD-10-CM | POA: Diagnosis not present

## 2019-08-07 DIAGNOSIS — F17210 Nicotine dependence, cigarettes, uncomplicated: Secondary | ICD-10-CM | POA: Diagnosis not present

## 2019-08-07 DIAGNOSIS — K660 Peritoneal adhesions (postprocedural) (postinfection): Secondary | ICD-10-CM | POA: Diagnosis not present

## 2019-08-07 DIAGNOSIS — M1711 Unilateral primary osteoarthritis, right knee: Secondary | ICD-10-CM | POA: Diagnosis not present

## 2019-08-07 DIAGNOSIS — I509 Heart failure, unspecified: Secondary | ICD-10-CM | POA: Diagnosis not present

## 2019-08-07 DIAGNOSIS — Z800 Family history of malignant neoplasm of digestive organs: Secondary | ICD-10-CM | POA: Diagnosis not present

## 2019-08-07 DIAGNOSIS — E785 Hyperlipidemia, unspecified: Secondary | ICD-10-CM | POA: Diagnosis not present

## 2019-08-07 DIAGNOSIS — Z880 Allergy status to penicillin: Secondary | ICD-10-CM | POA: Diagnosis not present

## 2019-08-07 LAB — BASIC METABOLIC PANEL
Anion gap: 13 (ref 5–15)
BUN: 80 mg/dL — ABNORMAL HIGH (ref 8–23)
CO2: 23 mmol/L (ref 22–32)
Calcium: 6.8 mg/dL — ABNORMAL LOW (ref 8.9–10.3)
Chloride: 103 mmol/L (ref 98–111)
Creatinine, Ser: 5.42 mg/dL — ABNORMAL HIGH (ref 0.44–1.00)
GFR calc Af Amer: 9 mL/min — ABNORMAL LOW (ref >60)
GFR calc non Af Amer: 8 mL/min — ABNORMAL LOW (ref >60)
Glucose, Bld: 102 mg/dL — ABNORMAL HIGH (ref 70–99)
Potassium: 4.1 mmol/L (ref 3.5–5.1)
Sodium: 139 mmol/L (ref 135–145)

## 2019-08-07 NOTE — Progress Notes (Signed)
[facility] Perioperative Services  Pre-Admission/Anesthesia Testing Clinical Review  Date: 08/07/19  Patient Demographics:  Name: Anna Key DOB:   January 22, 1951 MRN:   [id number]  Planned Surgical Procedure(s):    Case: [id number] Date/Time: 08/12/19 1200   Procedure: LAPAROSCOPIC INSERTION CONTINUOUS AMBULATORY PERITONEAL DIALYSIS  (CAPD) CATHETER (N/A )   Anesthesia type: General   Pre-op diagnosis: ESRD   Location: [facility] OR ROOM 08 / [place] ORS FOR ANESTHESIA GROUP   Surgeons: Jules Husbands, MD     NOTE: Available PAT nursing documentation and vital signs have been reviewed. Clinical nursing staff has updated patient's PMH/PSHx, current medication list, and drug allergies/intolerances to ensure comprehensive history available to assist in medical decision making as it pertains to the aforementioned surgical procedure and anticipated anesthetic course.   Clinical Discussion:  Anna Key is a 69 y.o. female who is submitted for pre-surgical anesthesia review and clearance prior to her undergoing the above procedure.Patient is a Current Smoker. Pertinent PMH includes: CHF, HLD, HTN, HCV (s/p 12 week Harvoni course), CKD-IV, peripheral edema, secondary hyperparathyroidism.   Patient followed by nephrology for CKD. She was last seen on 07/23/2019 by Dr. Murlean Iba; notes reviewed. Blood pressure stable in office at 122/77 on prescribed antihypertensives. Edema had improved. Recent labs included:  HBV sAg - negative  Quantiferon gold TB testing - negative  Na 140, K+ 4.2, glucose 132, Ca 7.0, Phos 7.7, Mg 2.3.  PTH 203 pg/mL  BUN 17 and creatine 6.67 mg/dL  WBC 8.4, Hgb 10.8, Hct 33.7, MCV 88.2, MCH 33.7, and plt 275 Patient was not uremic. Discussed starting patient on home PD. Referred to surgery for evaluation. Patient to start calcitrol once she begin PD treatments.   Patient seen by surgery on 07/27/2019 Dahlia Byes, MD); notes reviewed. MD  noted that patient is "very functional and is able to perform more than 4 METS without SOB or chest pain". Dr. Dahlia Byes deemed patient appropriate risk for PD catheter placement. Procedure scheduled.   She denies previous intra-operative complications with anesthesia. Had knee surgery here in 2019; [place], MD noted she was ASA III at the time of that procedure; records indicate no anesthetic complications. This patient is not on daily anticoagulation therapy.   Wt Readings from Last 3 Encounters:  08/05/19 92.5 kg  07/27/19 91.4 kg  06/11/17 88.5 kg   BMI Readings from Last 1 Encounters:  08/05/19 34.48 kg/m   Providers/Specialists:   PROVIDER ROLE LAST Suszanne Finch, MD Surgeon 07/27/2019  Kirk Ruths, MD Primary Care Provider 06/29/2019  Murlean Iba Nephrologist 07/23/2019   Allergies:  Penicillins  Current Home Medications:   No current facility-administered medications for this encounter.   Marland Kitchen allopurinol (ZYLOPRIM) 100 MG tablet  . amLODipine (NORVASC) 10 MG tablet  . atorvastatin (LIPITOR) 40 MG tablet  . isosorbide mononitrate (IMDUR) 60 MG 24 hr tablet  . lisinopril (ZESTRIL) 20 MG tablet  . metoprolol (TOPROL-XL) 200 MG 24 hr tablet  . torsemide (DEMADEX) 20 MG tablet   History:   Past Medical History:  Diagnosis Date  . Arthritis    osteoarthritis of right knee  . CHF (congestive heart failure) ([place])   . Chronic kidney disease    stage 3  . Gout   . Hepatitis C antibody test positive   . Hyperlipidemia   . Hypertension    Past Surgical History:  Procedure Laterality Date  . CARDIAC CATHETERIZATION    . PARTIAL KNEE ARTHROPLASTY Right 06/11/2017  Procedure: UNICOMPARTMENTAL KNEE;  Surgeon: Corky Mull, MD;  Location: [facility] ORS;  Service: Orthopedics;  Laterality: Right;  . REPLACEMENT TOTAL KNEE Left 2012   Family History  Problem Relation Age of Onset  . Colon cancer Mother    Social History   Tobacco Use  . Smoking status:  Current Every Day Smoker    Packs/day: 0.50    Types: Cigarettes  . Smokeless tobacco: Never Used  Vaping Use  . Vaping Use: Never used  Substance Use Topics  . Alcohol use: Never  . Drug use: Not Currently    Comment: 30 years ago    Pertinent Clinical Results:   Orders Placed This Encounter  Procedures  . Basic metabolic panel  . EKG 12-Lead   LABS: Labs reviewed: Acceptable for surgery. Patient has known CKD and is pending PD catheter placement. Surgeon Dahlia Byes, MD) has been made aware of abnormal lab values.  PLEASE NOTE: all labs that were ordered this encounter are listed, however only abnormal results are displayed. Labs Reviewed  BASIC METABOLIC PANEL - Abnormal; Notable for the following components:      Result Value   Glucose, Bld 102 (*)    BUN 80 (*)    Creatinine, Ser 5.42 (*)    Calcium 6.8 (*)    GFR calc non Af Amer 8 (*)    GFR calc Af Amer 9 (*)    All other components within normal limits    EKG: Date: 08/07/2019 Time ECG obtained: 0746 AM Rate: 64 bpm Rhythm: normal sinus Intervals: PR 174 ms. QTc 503 ms. ST segment and T wave changes: Anterolateral T wave inversion in I, II, aVL, aVF, V4-V-6. Comparison: Similar to previous tracing obtained on 05/29/2017.  IMAGING / PROCEDURES: ECHOCARDIOGRAM done on 03/09/2016  Left ventricular hypertrophy - moderate to severe   Normal left ventricular systolic function, ejection fraction 65 to 38%   Diastolic dysfunction - grade II (elevated filling pressures)   Dilated left atrium - mild   Mitral regurgitation - mild   Normal right ventricular systolic function  Impression and Plan:  Anna Key has been referred for pre-anesthesia review and clearance prior her undergoing the planned anesthetic and procedural courses. Available labs, pertinent testing, and imaging results were personally reviewed by me.   Based on clinical review performed today (08/07/19), barring and significant acute changes  in the patient's overall condition, it is anticipated that she will be able to proceed with the planned surgical intervention. Any acute changes in clinical condition may necessitate her procedure being postponed and/or cancelled. Pre-surgical instructions were reviewed with the patient during her PAT appointment and questions were fielded by PAT clinical staff.  Honor Loh, MSN, APRN, FNP-C, CEN [facility]  Peri-operative Services Nurse Practitioner Phone: [phone] 08/07/19 10:01 AM  NOTE: This note has been prepared using Dragon dictation software. Despite my best ability to proofread, there is always the potential that unintentional transcriptional errors may still occur from this process.

## 2019-08-07 NOTE — Anesthesia Preprocedure Evaluation (Addendum)
Anesthesia Evaluation    Airway Mallampati: III  TM Distance: >3 FB     Dental  (+) Chipped, Missing   Pulmonary Current Smoker and Patient abstained from smoking.,    breath sounds clear to auscultation       Cardiovascular Exercise Tolerance: Good hypertension, +CHF   Rhythm:regular Rate:Normal     Neuro/Psych    GI/Hepatic   Endo/Other    Renal/GU ESRFRenal disease (not yet on dialysis)     Musculoskeletal   Abdominal   Peds  Hematology   Anesthesia Other Findings   Reproductive/Obstetrics                            Anesthesia Physical Anesthesia Plan  ASA: III  Anesthesia Plan: General ETT   Post-op Pain Management:    Induction:   PONV Risk Score and Plan:   Airway Management Planned:   Additional Equipment:   Intra-op Plan:   Post-operative Plan:   Informed Consent:   Plan Discussed with:   Anesthesia Plan Comments: (See clinical review (PAT note) dated 08/07/19 - Honor Loh, FNP-C)      Anesthesia Quick Evaluation

## 2019-08-07 NOTE — Progress Notes (Signed)
  [facility] Perioperative Services: Pre-Admission/Anesthesia Testing  Abnormal Lab Notification  Date: 08/07/19  Name: CRUCITA LACORTE MRN:   [id number]  Re: Abnormal labs noted during PAT appointment  Provider Notified: Jules Husbands, MD Notification mode: Routed via [place] of concern: Lab Results  Component Value Date   BUN 80 (H) 08/07/2019   CREATININE 5.42 (H) 08/07/2019   GFRAA 9 (L) 08/07/2019   CALCIUM 6.8 (L) 08/07/2019   Honor Loh, MSN, APRN, FNP-C, CEN [place]  Peri-operative Services Nurse Practitioner Phone: [phone] 08/07/19 9:57 AM

## 2019-08-10 ENCOUNTER — Other Ambulatory Visit: Payer: Self-pay

## 2019-08-10 ENCOUNTER — Other Ambulatory Visit
Admission: RE | Admit: 2019-08-10 | Discharge: 2019-08-10 | Disposition: A | Discharge status: Home / Self Care | Payer: Medicare HMO | Source: Ambulatory Visit | Attending: Surgery | Admitting: Surgery

## 2019-08-10 DIAGNOSIS — Z01812 Encounter for preprocedural laboratory examination: Secondary | ICD-10-CM | POA: Insufficient documentation

## 2019-08-10 DIAGNOSIS — Z20822 Contact with and (suspected) exposure to covid-19: Secondary | ICD-10-CM | POA: Diagnosis not present

## 2019-08-10 LAB — SARS CORONAVIRUS 2 (TAT 6-24 HRS): SARS Coronavirus 2: NEGATIVE

## 2019-08-12 ENCOUNTER — Ambulatory Visit
Admission: RE | Admit: 2019-08-12 | Discharge: 2019-08-12 | Disposition: A | Discharge status: Home / Self Care | Payer: Medicare HMO | Attending: Surgery | Admitting: Surgery

## 2019-08-12 ENCOUNTER — Other Ambulatory Visit: Payer: Self-pay

## 2019-08-12 ENCOUNTER — Encounter: Payer: Self-pay | Admitting: Surgery

## 2019-08-12 ENCOUNTER — Ambulatory Visit: Payer: Medicare HMO | Admitting: Urgent Care

## 2019-08-12 ENCOUNTER — Encounter
Admission: RE | Disposition: A | Discharge status: Home / Self Care | Payer: Self-pay | Source: Home / Self Care | Attending: Surgery

## 2019-08-12 DIAGNOSIS — F17200 Nicotine dependence, unspecified, uncomplicated: Secondary | ICD-10-CM | POA: Insufficient documentation

## 2019-08-12 DIAGNOSIS — E785 Hyperlipidemia, unspecified: Secondary | ICD-10-CM | POA: Diagnosis not present

## 2019-08-12 DIAGNOSIS — F17210 Nicotine dependence, cigarettes, uncomplicated: Secondary | ICD-10-CM | POA: Insufficient documentation

## 2019-08-12 DIAGNOSIS — K660 Peritoneal adhesions (postprocedural) (postinfection): Secondary | ICD-10-CM | POA: Diagnosis not present

## 2019-08-12 DIAGNOSIS — Z8619 Personal history of other infectious and parasitic diseases: Secondary | ICD-10-CM | POA: Insufficient documentation

## 2019-08-12 DIAGNOSIS — N186 End stage renal disease: Secondary | ICD-10-CM | POA: Insufficient documentation

## 2019-08-12 DIAGNOSIS — Z880 Allergy status to penicillin: Secondary | ICD-10-CM | POA: Insufficient documentation

## 2019-08-12 DIAGNOSIS — Z96653 Presence of artificial knee joint, bilateral: Secondary | ICD-10-CM | POA: Insufficient documentation

## 2019-08-12 DIAGNOSIS — I132 Hypertensive heart and chronic kidney disease with heart failure and with stage 5 chronic kidney disease, or end stage renal disease: Secondary | ICD-10-CM | POA: Insufficient documentation

## 2019-08-12 DIAGNOSIS — Z79899 Other long term (current) drug therapy: Secondary | ICD-10-CM | POA: Insufficient documentation

## 2019-08-12 DIAGNOSIS — N9489 Other specified conditions associated with female genital organs and menstrual cycle: Secondary | ICD-10-CM | POA: Insufficient documentation

## 2019-08-12 DIAGNOSIS — I509 Heart failure, unspecified: Secondary | ICD-10-CM | POA: Diagnosis not present

## 2019-08-12 DIAGNOSIS — M109 Gout, unspecified: Secondary | ICD-10-CM | POA: Diagnosis not present

## 2019-08-12 DIAGNOSIS — Z800 Family history of malignant neoplasm of digestive organs: Secondary | ICD-10-CM | POA: Insufficient documentation

## 2019-08-12 DIAGNOSIS — M1711 Unilateral primary osteoarthritis, right knee: Secondary | ICD-10-CM | POA: Diagnosis not present

## 2019-08-12 HISTORY — PX: LAPAROSCOPIC LYSIS OF ADHESIONS: SHX5905

## 2019-08-12 SURGERY — LYSIS, ADHESIONS, LAPAROSCOPIC
Anesthesia: General

## 2019-08-12 MED ORDER — BUPIVACAINE LIPOSOME 1.3 % IJ SUSP
INTRAMUSCULAR | Status: DC | PRN
  Administered 2019-08-12: 20 mL

## 2019-08-12 MED ORDER — SUCCINYLCHOLINE CHLORIDE 20 MG/ML IJ SOLN
INTRAMUSCULAR | Status: DC | PRN
  Administered 2019-08-12: 80 mg via INTRAVENOUS

## 2019-08-12 MED ORDER — GABAPENTIN 300 MG PO CAPS
ORAL_CAPSULE | ORAL | Status: AC
  Administered 2019-08-12: 300 mg via ORAL
  Filled 2019-08-12: qty 1

## 2019-08-12 MED ORDER — CHLORHEXIDINE GLUCONATE 0.12 % MT SOLN: 15.0000 mL | Freq: Once | OROMUCOSAL | Status: AC

## 2019-08-12 MED ORDER — FAMOTIDINE 20 MG PO TABS: 20.0000 mg | ORAL_TABLET | Freq: Once | ORAL | Status: AC

## 2019-08-12 MED ORDER — FENTANYL CITRATE (PF) 100 MCG/2ML IJ SOLN
INTRAMUSCULAR | Status: AC
  Filled 2019-08-12: qty 2

## 2019-08-12 MED ORDER — LIDOCAINE HCL (CARDIAC) PF 100 MG/5ML IV SOSY
PREFILLED_SYRINGE | INTRAVENOUS | Status: DC | PRN
  Administered 2019-08-12: 80 mg via INTRAVENOUS

## 2019-08-12 MED ORDER — SODIUM CHLORIDE (PF) 0.9 % IJ SOLN
INTRAMUSCULAR | Status: AC
  Filled 2019-08-12: qty 20

## 2019-08-12 MED ORDER — SUGAMMADEX SODIUM 200 MG/2ML IV SOLN
INTRAVENOUS | Status: DC | PRN
  Administered 2019-08-12: 200 mg via INTRAVENOUS

## 2019-08-12 MED ORDER — OXYCODONE HCL 5 MG PO TABS: 5.0000 mg | ORAL_TABLET | Freq: Once | ORAL | Status: DC | PRN

## 2019-08-12 MED ORDER — BUPIVACAINE-EPINEPHRINE (PF) 0.25% -1:200000 IJ SOLN
INTRAMUSCULAR | Status: AC
  Filled 2019-08-12: qty 30

## 2019-08-12 MED ORDER — ACETAMINOPHEN 500 MG PO TABS: 1000.0000 mg | ORAL_TABLET | ORAL | Status: AC

## 2019-08-12 MED ORDER — GABAPENTIN 300 MG PO CAPS: 300.0000 mg | ORAL_CAPSULE | ORAL | Status: AC

## 2019-08-12 MED ORDER — BUPIVACAINE-EPINEPHRINE 0.25% -1:200000 IJ SOLN
INTRAMUSCULAR | Status: DC | PRN
  Administered 2019-08-12: 30 mL

## 2019-08-12 MED ORDER — ROCURONIUM BROMIDE 100 MG/10ML IV SOLN
INTRAVENOUS | Status: DC | PRN
  Administered 2019-08-12: 30 mg via INTRAVENOUS

## 2019-08-12 MED ORDER — ONDANSETRON HCL 4 MG/2ML IJ SOLN: 4.0000 mg | Freq: Once | INTRAMUSCULAR | Status: DC | PRN

## 2019-08-12 MED ORDER — FENTANYL CITRATE (PF) 100 MCG/2ML IJ SOLN: 25.0000 ug | INTRAMUSCULAR | Status: DC | PRN

## 2019-08-12 MED ORDER — FENTANYL CITRATE (PF) 100 MCG/2ML IJ SOLN
INTRAMUSCULAR | Status: AC
  Administered 2019-08-12: 25 ug via INTRAVENOUS
  Filled 2019-08-12: qty 2

## 2019-08-12 MED ORDER — CHLORHEXIDINE GLUCONATE CLOTH 2 % EX PADS: 6.0000 | MEDICATED_PAD | Freq: Once | CUTANEOUS | Status: DC

## 2019-08-12 MED ORDER — FENTANYL CITRATE (PF) 100 MCG/2ML IJ SOLN
INTRAMUSCULAR | Status: DC | PRN
  Administered 2019-08-12 (×2): 50 ug via INTRAVENOUS

## 2019-08-12 MED ORDER — FAMOTIDINE 20 MG PO TABS
ORAL_TABLET | ORAL | Status: AC
  Administered 2019-08-12: 20 mg via ORAL
  Filled 2019-08-12: qty 1

## 2019-08-12 MED ORDER — VASOPRESSIN 20 UNIT/ML IV SOLN
INTRAVENOUS | Status: DC | PRN
Start: 2019-08-12 — End: 2019-08-12
  Administered 2019-08-12: .5 [IU] via INTRAVENOUS
  Administered 2019-08-12: 1 [IU] via INTRAVENOUS
  Administered 2019-08-12: .5 [IU] via INTRAVENOUS

## 2019-08-12 MED ORDER — ONDANSETRON HCL 4 MG/2ML IJ SOLN
INTRAMUSCULAR | Status: DC | PRN
  Administered 2019-08-12: 4 mg via INTRAVENOUS

## 2019-08-12 MED ORDER — SODIUM CHLORIDE 0.9 % IV SOLN: INTRAVENOUS | Status: DC

## 2019-08-12 MED ORDER — PROPOFOL 10 MG/ML IV BOLUS
INTRAVENOUS | Status: DC | PRN
  Administered 2019-08-12: 120 mg via INTRAVENOUS

## 2019-08-12 MED ORDER — CLINDAMYCIN PHOSPHATE 900 MG/50ML IV SOLN
INTRAVENOUS | Status: AC
  Filled 2019-08-12: qty 50

## 2019-08-12 MED ORDER — ACETAMINOPHEN 500 MG PO TABS
ORAL_TABLET | ORAL | Status: AC
  Administered 2019-08-12: 1000 mg via ORAL
  Filled 2019-08-12: qty 2

## 2019-08-12 MED ORDER — OXYCODONE HCL 5 MG/5ML PO SOLN: 5.0000 mg | Freq: Once | ORAL | Status: DC | PRN

## 2019-08-12 MED ORDER — BUPIVACAINE LIPOSOME 1.3 % IJ SUSP
INTRAMUSCULAR | Status: AC
  Filled 2019-08-12: qty 20

## 2019-08-12 MED ORDER — HEPARIN SODIUM (PORCINE) 10000 UNIT/ML IJ SOLN
INTRAMUSCULAR | Status: AC
  Filled 2019-08-12: qty 1

## 2019-08-12 MED ORDER — HYDROCODONE-ACETAMINOPHEN 5-325 MG PO TABS: 1.0000 | ORAL_TABLET | Freq: Four times a day (QID) | ORAL | 0 refills | Status: DC | PRN

## 2019-08-12 MED ORDER — EPHEDRINE SULFATE 50 MG/ML IJ SOLN
INTRAMUSCULAR | Status: DC | PRN
  Administered 2019-08-12 (×2): 10 mg via INTRAVENOUS

## 2019-08-12 MED ORDER — CHLORHEXIDINE GLUCONATE 0.12 % MT SOLN
OROMUCOSAL | Status: AC
  Administered 2019-08-12: 15 mL via OROMUCOSAL
  Filled 2019-08-12: qty 15

## 2019-08-12 MED ORDER — CLINDAMYCIN PHOSPHATE 900 MG/50ML IV SOLN
900.0000 mg | INTRAVENOUS | Status: AC
  Administered 2019-08-12: 900 mg via INTRAVENOUS

## 2019-08-12 MED ORDER — VASOPRESSIN 20 UNIT/ML IV SOLN
INTRAVENOUS | Status: AC
  Filled 2019-08-12: qty 1

## 2019-08-12 MED ORDER — PHENYLEPHRINE HCL (PRESSORS) 10 MG/ML IV SOLN
INTRAVENOUS | Status: DC | PRN
  Administered 2019-08-12: 100 ug via INTRAVENOUS
  Administered 2019-08-12: 200 ug via INTRAVENOUS

## 2019-08-12 SURGICAL SUPPLY — 46 items
ADAPTER TITANIUM MEDIONICS (MISCELLANEOUS) ×4 IMPLANT
BLADE CLIPPER SURG (BLADE) IMPLANT
CANISTER SUCT 1200ML W/VALVE (MISCELLANEOUS) ×4 IMPLANT
CATH EXTENDED DIALYSIS (CATHETERS) IMPLANT
CHLORAPREP W/TINT 26 (MISCELLANEOUS) ×4 IMPLANT
COVER WAND RF STERILE (DRAPES) ×4 IMPLANT
DERMABOND ADVANCED (GAUZE/BANDAGES/DRESSINGS) ×2
DERMABOND ADVANCED .7 DNX12 (GAUZE/BANDAGES/DRESSINGS) ×2 IMPLANT
ELECT CAUTERY BLADE 6.4 (BLADE) ×4 IMPLANT
ELECT REM PT RETURN 9FT ADLT (ELECTROSURGICAL) ×4
ELECTRODE REM PT RTRN 9FT ADLT (ELECTROSURGICAL) ×2 IMPLANT
GLOVE BIO SURGEON STRL SZ7 (GLOVE) ×4 IMPLANT
GOWN STRL REUS W/ TWL LRG LVL3 (GOWN DISPOSABLE) ×4 IMPLANT
GOWN STRL REUS W/TWL LRG LVL3 (GOWN DISPOSABLE) ×4
GRASPER SUT TROCAR 14GX15 (MISCELLANEOUS) ×4 IMPLANT
IRRIGATION STRYKERFLOW (MISCELLANEOUS) ×2 IMPLANT
IRRIGATOR STRYKERFLOW (MISCELLANEOUS) ×4
IV NS 1000ML (IV SOLUTION) ×2
IV NS 1000ML BAXH (IV SOLUTION) ×2 IMPLANT
KIT TURNOVER KIT A (KITS) ×4 IMPLANT
NDL SAFETY ECLIPSE 18X1.5 (NEEDLE) ×2 IMPLANT
NEEDLE HYPO 18GX1.5 SHARP (NEEDLE) ×2
NEEDLE HYPO 22GX1.5 SAFETY (NEEDLE) ×4 IMPLANT
NS IRRIG 500ML POUR BTL (IV SOLUTION) ×4 IMPLANT
PACK LAP CHOLECYSTECTOMY (MISCELLANEOUS) ×4 IMPLANT
PENCIL ELECTRO HAND CTR (MISCELLANEOUS) ×4 IMPLANT
SCISSORS METZENBAUM CVD 33 (INSTRUMENTS) ×4 IMPLANT
SET CYSTO W/LG BORE CLAMP LF (SET/KITS/TRAYS/PACK) IMPLANT
SET TUBE SMOKE EVAC HIGH FLOW (TUBING) ×4 IMPLANT
SLEEVE ADV FIXATION 5X100MM (TROCAR) ×4 IMPLANT
SPONGE DRAIN TRACH 4X4 STRL 2S (GAUZE/BANDAGES/DRESSINGS) IMPLANT
SPONGE LAP 18X18 RF (DISPOSABLE) ×4 IMPLANT
STYLET FALLER (MISCELLANEOUS) IMPLANT
STYLET FALLER MEDIONICS (MISCELLANEOUS) IMPLANT
SUT MNCRL 4-0 (SUTURE) ×2
SUT MNCRL 4-0 27XMFL (SUTURE) ×2
SUT MNCRL AB 4-0 PS2 18 (SUTURE) ×4 IMPLANT
SUT VIC AB 3-0 SH 27 (SUTURE) ×2
SUT VIC AB 3-0 SH 27X BRD (SUTURE) ×2 IMPLANT
SUT VICRYL 0 AB UR-6 (SUTURE) ×8 IMPLANT
SUTURE MNCRL 4-0 27XMF (SUTURE) ×2 IMPLANT
SYR 20ML LL LF (SYRINGE) ×4 IMPLANT
SYR 3ML LL SCALE MARK (SYRINGE) ×4 IMPLANT
SYS KII FIOS ACCESS ABD 5X100 (TROCAR) ×4
SYSTEM KII FIOS ACES ABD 5X100 (TROCAR) ×2 IMPLANT
TROCAR BALLN GELPORT 12X130M (ENDOMECHANICALS) IMPLANT

## 2019-08-12 NOTE — Anesthesia Procedure Notes (Signed)
Procedure Name: Intubation Date/Time: 08/12/2019 12:53 PM Performed by: Chanetta Marshall, CRNA Pre-anesthesia Checklist: Patient identified, Emergency Drugs available, Suction available and Patient being monitored Patient Re-evaluated:Patient Re-evaluated prior to induction Oxygen Delivery Method: Circle system utilized Preoxygenation: Pre-oxygenation with 100% oxygen Induction Type: IV induction Ventilation: Mask ventilation without difficulty Laryngoscope Size: McGraph and 3 Grade View: Grade I Tube type: Oral Tube size: 7.0 mm Number of attempts: 1 Airway Equipment and Method: Stylet,  Oral airway and Video-laryngoscopy Placement Confirmation: ETT inserted through vocal cords under direct vision,  positive ETCO2,  breath sounds checked- equal and bilateral and CO2 detector Secured at: 21 cm Tube secured with: Tape Dental Injury: Teeth and Oropharynx as per pre-operative assessment

## 2019-08-12 NOTE — Discharge Instructions (Addendum)

## 2019-08-12 NOTE — Interval H&P Note (Signed)
History and Physical Interval Note:  08/12/2019 11:29 AM  Anna Key  has presented today for surgery, with the diagnosis of ESRD.  The various methods of treatment have been discussed with the patient and family. After consideration of risks, benefits and other options for treatment, the patient has consented to  Procedure(s): [place]  (CAPD) CATHETER (N/A) as a surgical intervention.  The patient's history has been reviewed, patient examined, no change in status, stable for surgery.  I have reviewed the patient's chart and labs.  Questions were answered to the patient's satisfaction.     [place]

## 2019-08-12 NOTE — Progress Notes (Signed)
Pt crying and does not understand why shw does not have her peritoneal catheter in place. Dr Dahlia Byes called and came to pt's room to talk to her and explained exactly what happened.

## 2019-08-12 NOTE — Transfer of Care (Signed)
Immediate Anesthesia Transfer of Care Note  Patient: Anna Key  Procedure(s) Performed: LAPAROSCOPIC LYSIS OF ADHESIONS  Patient Location: PACU  Anesthesia Type:General  Level of Consciousness: awake, alert  and oriented  Airway & Oxygen Therapy: Patient Spontanous Breathing and Patient connected to nasal cannula oxygen  Post-op Assessment: Report given to RN and Post -op Vital signs reviewed and stable  Post vital signs: Reviewed and stable  Last Vitals:  Vitals Value Taken Time  BP 109/70 08/12/19 1422  Temp    Pulse 71 08/12/19 1424  Resp 17 08/12/19 1424  SpO2 95 % 08/12/19 1424  Vitals shown include unvalidated device data.  Last Pain:  Vitals:   08/12/19 0949  TempSrc: Temporal  PainSc: 0-No pain         Complications: No complications documented.

## 2019-08-12 NOTE — Op Note (Signed)
Laparoscopic lysis of adhesions Attempted placement of peritoneal dialysis catheter   Pre-operative Diagnosis: ESRD   Post-operative Diagnosis: same     Surgeon: Caroleen Hamman, MD FACS   Anesthesia: Gen. with endotracheal tube      Findings: Dense and extensive adhesion from the omentum to the pelvis, uterus to the pelvic wall and small bowel to elvis Unable to appropriately placed catheter within the pelvis due to frozen pelvis and extensive adhesions  Estimated Blood Loss: 5cc              Complications: none     Procedure Details  The patient was seen again in the Holding Room. The benefits, complications, treatment options, and expected outcomes were discussed with the patient. The risks of bleeding, infection, recurrence of symptoms, failure to resolve symptoms, catheter malfunction bowel injury, any of which could require further surgery were reviewed with the patient. The likelihood of improving the patient's symptoms with return to their baseline status is good.  The patient and/or family concurred with the proposed plan, giving informed consent.  The patient was taken to Operating Room, identified and the procedure verified. A Time Out was held and the above information confirmed.   Prior to the induction of general anesthesia, antibiotic prophylaxis was administered. VTE prophylaxis was in place. General endotracheal anesthesia was then administered and tolerated well. After the induction, the abdomen was prepped with Chloraprep and draped in the sterile fashion. The patient was positioned in the supine position.   Infraumbilical incision created to the left of the midline.   The anterior rectus fascia identified and incised, rectus muscle identified and retracted laterally, using a port We were able to tunnel into the retro rectus space for about 6 cms. Peritoneum was pierced off the midline. Pneumoperitoneum was obtained w/o hemodynamic changes. We placed three additional  laparoscopic ports under direct visualization.  We were able to thread the catheter via the laparoscopic port within the retrorectus space in the standard fashion.  sHe did have extensive adhesions and lyse of adhesions were performed with sharp scissors in the standard fashion.  Please note that I spent at least 30 minutes lysing adhesions.  We develop a plane and remove the omentum from the pelvis but there was significant uterus that was stuck to the bladder wall as well as small bowel that was in the pelvis.  Technically I was unable to safely place a catheter within the pelvis and given all redundant omentum and adhesions I felt that she was not going to be a suitable candidate for PD catheter. At this time I decided not to pursue the placement of the PD catheter.  The PD catheter was removed and a second look laparoscopy showed no evidence of any injuries. All skin incisions  were infiltrated with a liposomal Marcaine. Anterior rectus sheath was closed with a running 0 Vicryl in the standard fashion. 4-0 subcuticular Monocryl was used to close the skin. Dermabond was  applied. Sterile dressing applied to the catheter. The patient was then extubated and brought to the recovery room in stable condition. Sponge, lap, and needle counts were correct at closure and at the conclusion of the case.               Caroleen Hamman, MD, FACS

## 2019-08-13 NOTE — Anesthesia Postprocedure Evaluation (Signed)
Anesthesia Post Note  Patient: Anna Key  Procedure(s) Performed: LAPAROSCOPIC LYSIS OF ADHESIONS  Patient location during evaluation: PACU Anesthesia Type: General Level of consciousness: awake and alert Pain management: pain level controlled Vital Signs Assessment: post-procedure vital signs reviewed and stable Respiratory status: spontaneous breathing, nonlabored ventilation and respiratory function stable Cardiovascular status: blood pressure returned to baseline and stable Postop Assessment: no apparent nausea or vomiting Anesthetic complications: no   No complications documented.   Last Vitals:  Vitals:   08/12/19 1527 08/12/19 1548  BP: 109/61 107/70  Pulse: 74 66  Resp: 16 16  Temp: (!) 36.3 C 36.6 C  SpO2: 93% 93%    Last Pain:  Vitals:   08/13/19 0843  TempSrc:   PainSc: 0-No pain                 Tera Mater

## 2019-08-26 ENCOUNTER — Ambulatory Visit (INDEPENDENT_AMBULATORY_CARE_PROVIDER_SITE_OTHER): Payer: Medicare HMO | Admitting: Surgery

## 2019-08-26 ENCOUNTER — Encounter: Payer: Self-pay | Admitting: Surgery

## 2019-08-26 ENCOUNTER — Other Ambulatory Visit: Payer: Self-pay

## 2019-08-26 VITALS — BP 142/83 | HR 75 | Temp 98.3°F | Resp 12 | Ht 64.5 in | Wt 204.0 lb

## 2019-08-26 DIAGNOSIS — N186 End stage renal disease: Secondary | ICD-10-CM

## 2019-08-26 NOTE — Progress Notes (Signed)
Anna Key is a 69 year old female 2 weeks after diagnostic laparoscopy unable to perform PD catheter in the correct position due to frozen pelvis and inability of the catheter to safely be placed within the pelvis.  Is doing well otherwise no fevers no chills no abdominal pain currently she does have a little bit of drainage from the umbilical wound but that has subsided.  PE NAD Abd: soft, incisions c/d/i, no infection or peritonitis  A./P no surgical complications.  I did explain to her the reason why I was not able to place the catheter.  He is disappointed.  She is very hesitant about doing hemodialysis and I encouraged her to discuss with Dr. Candiss Norse order for further plans.

## 2019-08-26 NOTE — Patient Instructions (Addendum)
Keep your appointment with Dr Candiss Norse for tomorrow.  Follow up as needed, call the office if you have any questions or concerns.    GENERAL POST-OPERATIVE PATIENT INSTRUCTIONS   WOUND CARE INSTRUCTIONS:  Keep a dry clean dressing on the wound if there is drainage. The initial bandage may be removed after 24 hours.  Once the wound has quit draining you may leave it open to air.  If clothing rubs against the wound or causes irritation and the wound is not draining you may cover it with a dry dressing during the daytime.  Try to keep the wound dry and avoid ointments on the wound unless directed to do so.  If the wound becomes bright red and painful or starts to drain infected material that is not clear, please contact your physician immediately.  If the wound is mildly pink and has a thick firm ridge underneath it, this is normal, and is referred to as a healing ridge.  This will resolve over the next 4-6 weeks.  BATHING: You may shower if you have been informed of this by your surgeon. However, Please do not submerge in a tub, hot tub, or pool until incisions are completely sealed or have been told by your surgeon that you may do so.  DIET:  You may eat any foods that you can tolerate.  It is a good idea to eat a high fiber diet and take in plenty of fluids to prevent constipation.  If you do become constipated you may want to take a mild laxative or take ducolax tablets on a daily basis until your bowel habits are regular.  Constipation can be very uncomfortable, along with straining, after recent surgery.  ACTIVITY:  You are encouraged to cough and deep breath or use your incentive spirometer if you were given one, every 15-30 minutes when awake.  This will help prevent respiratory complications and low grade fevers post-operatively if you had a general anesthetic.  You may want to hug a pillow when coughing and sneezing to add additional support to the surgical area, if you had abdominal or chest  surgery, which will decrease pain during these times.  You are encouraged to walk and engage in light activity for the next two weeks.  You should not lift more than 20 pounds 4-6 weeks after surgery, as it could put you at increased risk for complications.  Twenty pounds is roughly equivalent to a plastic bag of groceries. At that time- Listen to your body when lifting, if you have pain when lifting, stop and then try again in a few days. Soreness after doing exercises or activities of daily living is normal as you get back in to your normal routine.  MEDICATIONS:  Try to take narcotic medications and anti-inflammatory medications, such as tylenol, ibuprofen, naprosyn, etc., with food.  This will minimize stomach upset from the medication.  Should you develop nausea and vomiting from the pain medication, or develop a rash, please discontinue the medication and contact your physician.  You should not drive, make important decisions, or operate machinery when taking narcotic pain medication.  SUNBLOCK Use sun block to incision area over the next year if this area will be exposed to sun. This helps decrease scarring and will allow you avoid a permanent darkened area over your incision.  QUESTIONS:  Please feel free to call our office if you have any questions, and we will be glad to assist you.

## 2019-08-27 DIAGNOSIS — R801 Persistent proteinuria, unspecified: Secondary | ICD-10-CM | POA: Diagnosis not present

## 2019-08-27 DIAGNOSIS — I10 Essential (primary) hypertension: Secondary | ICD-10-CM | POA: Diagnosis not present

## 2019-08-27 DIAGNOSIS — R600 Localized edema: Secondary | ICD-10-CM | POA: Diagnosis not present

## 2019-08-27 DIAGNOSIS — N185 Chronic kidney disease, stage 5: Secondary | ICD-10-CM | POA: Diagnosis not present

## 2019-09-02 ENCOUNTER — Other Ambulatory Visit (INDEPENDENT_AMBULATORY_CARE_PROVIDER_SITE_OTHER): Payer: Self-pay | Admitting: Vascular Surgery

## 2019-09-02 ENCOUNTER — Other Ambulatory Visit (INDEPENDENT_AMBULATORY_CARE_PROVIDER_SITE_OTHER): Payer: Self-pay | Admitting: Nurse Practitioner

## 2019-09-02 DIAGNOSIS — N289 Disorder of kidney and ureter, unspecified: Secondary | ICD-10-CM | POA: Insufficient documentation

## 2019-09-02 DIAGNOSIS — N186 End stage renal disease: Secondary | ICD-10-CM

## 2019-09-02 DIAGNOSIS — I2510 Atherosclerotic heart disease of native coronary artery without angina pectoris: Secondary | ICD-10-CM | POA: Insufficient documentation

## 2019-09-02 DIAGNOSIS — E785 Hyperlipidemia, unspecified: Secondary | ICD-10-CM | POA: Insufficient documentation

## 2019-09-02 DIAGNOSIS — I10 Essential (primary) hypertension: Secondary | ICD-10-CM | POA: Insufficient documentation

## 2019-09-02 NOTE — Progress Notes (Signed)
MRN : [id number]  Anna Key is a 69 y.o. (01-Feb-1951) female who presents with chief complaint of No chief complaint on file. Marland Kitchen  History of Present Illness:   The patient is seen for evaluation for dialysis access. The patient has chronic renal insufficiency stage V secondary to hypertension and diabetes. The patient's most recent creatinine clearance is less than 20. The patient volume status has not yet become an issue. Patient's blood pressures been relatively well controlled. There are mild uremic symptoms which appear to be relatively well tolerated at this time. The patient is right-handed.  Recently attempts were made at placing a PD catheter but secondary to extensive adhesions within the pelvis peritoneal dialysis catheter placement could not be performed and PD would not be an adequate form of renal replacement.  The patient has been considering the various methods of dialysis and wishes to proceed with hemodialysis and therefore creation of AV access.  The patient denies amaurosis fugax or recent TIA symptoms. There are no recent neurological changes noted. The patient denies claudication symptoms or rest pain symptoms. The patient denies history of DVT, PE or superficial thrombophlebitis. The patient denies recent episodes of angina or shortness of breath.   No outpatient medications have been marked as taking for the 09/03/19 encounter (Appointment) with Delana Meyer, Dolores Lory, MD.    Past Medical History:  Diagnosis Date  . Arthritis    osteoarthritis of right knee  . CHF (congestive heart failure) ([place])   . Chronic kidney disease    stage 3  . Gout   . Hepatitis C antibody test positive   . Hyperlipidemia   . Hypertension     Past Surgical History:  Procedure Laterality Date  . CARDIAC CATHETERIZATION    . LAPAROSCOPIC LYSIS OF ADHESIONS  08/12/2019   Procedure: LAPAROSCOPIC LYSIS OF ADHESIONS;  Surgeon: Jules Husbands, MD;  Location: [facility] ORS;  Service:  General;;  . PARTIAL KNEE ARTHROPLASTY Right 06/11/2017   Procedure: UNICOMPARTMENTAL KNEE;  Surgeon: Corky Mull, MD;  Location: [facility] ORS;  Service: Orthopedics;  Laterality: Right;  . REPLACEMENT TOTAL KNEE Left 2012    Social History Social History   Tobacco Use  . Smoking status: Current Every Day Smoker    Packs/day: 0.50    Types: Cigarettes  . Smokeless tobacco: Never Used  Vaping Use  . Vaping Use: Never used  Substance Use Topics  . Alcohol use: Never  . Drug use: Not Currently    Comment: 30 years ago    Family History Family History  Problem Relation Age of Onset  . Colon cancer Mother   No family history of bleeding/clotting disorders, porphyria or autoimmune disease   Allergies  Allergen Reactions  . Penicillins Hives and Rash    Has patient had a PCN reaction causing immediate rash, facial/tongue/throat swelling, SOB or lightheadedness with hypotension: Yes Has patient had a PCN reaction causing severe rash involving mucus membranes or skin necrosis: Yes--all over body Has patient had a PCN reaction that required hospitalization: No  Has patient had a PCN reaction occurring within the last 10 years: No If all of the above answers are "NO", then may proceed with Cephalosporin use.      REVIEW OF SYSTEMS (Negative unless checked)  Constitutional: [] Weight loss  [] Fever  [] Chills Cardiac: [] Chest pain   [] Chest pressure   [] Palpitations   [] Shortness of breath when laying flat   [] Shortness of breath with exertion. Vascular:  [] Pain in legs with  walking   [] Pain in legs at rest  [] History of DVT   [] Phlebitis   [] Swelling in legs   [] Varicose veins   [] Non-healing ulcers Pulmonary:   [] Uses home oxygen   [] Productive cough   [] Hemoptysis   [] Wheeze  [] COPD   [] Asthma Neurologic:  [] Dizziness   [] Seizures   [] History of stroke   [] History of TIA  [] Aphasia   [] Vissual changes   [] Weakness or numbness in arm   [] Weakness or numbness in leg Musculoskeletal:    [] Joint swelling   [] Joint pain   [] Low back pain Hematologic:  [] Easy bruising  [] Easy bleeding   [] Hypercoagulable state   [] Anemic Gastrointestinal:  [] Diarrhea   [] Vomiting  [] Gastroesophageal reflux/heartburn   [] Difficulty swallowing. Genitourinary:  [x] Chronic kidney disease   [] Difficult urination  [] Frequent urination   [] Blood in urine Skin:  [] Rashes   [] Ulcers  Psychological:  [] History of anxiety   []  History of major depression.  Physical Examination  There were no vitals filed for this visit. There is no height or weight on file to calculate BMI. Gen: WD/WN, NAD Head: [place]/AT, No temporalis wasting.  Ear/Nose/Throat: Hearing grossly intact, nares w/o erythema or drainage, poor dentition Eyes: PER, EOMI, sclera nonicteric.  Neck: Supple, no masses.  No bruit or JVD.  Pulmonary:  Good air movement, clear to auscultation bilaterally, no use of accessory muscles.  Cardiac: RRR, normal S1, S2, no Murmurs. Vascular: palpable and visible vein in the left antecubital fossa Vessel Right Left  Radial Palpable Palpable  Brachial Palpable Palpable  Gastrointestinal: soft, non-distended. No guarding/no peritoneal signs.  Musculoskeletal: M/S 5/5 throughout.  No deformity or atrophy.  Neurologic: CN 2-12 intact. Pain and light touch intact in extremities.  Symmetrical.  Speech is fluent. Motor exam as listed above. Psychiatric: Judgment intact, Mood & affect appropriate for pt's clinical situation. Dermatologic: No rashes or ulcers noted.  No changes consistent with cellulitis.   CBC Lab Results  Component Value Date   WBC 9.0 06/12/2017   HGB 11.9 (L) 06/12/2017   HCT 36.0 06/12/2017   MCV 92.0 06/12/2017   PLT 193 06/12/2017    BMET    Component Value Date/Time   NA 139 08/07/2019 0754   NA 140 08/29/2011 1925   K 4.1 08/07/2019 0754   K 3.5 08/29/2011 1925   CL 103 08/07/2019 0754   CL 104 08/29/2011 1925   CO2 23 08/07/2019 0754   CO2 25 08/29/2011 1925   GLUCOSE  102 (H) 08/07/2019 0754   GLUCOSE 79 08/29/2011 1925   BUN 80 (H) 08/07/2019 0754   BUN 25 (H) 08/29/2011 1925   CREATININE 5.42 (H) 08/07/2019 0754   CREATININE 1.60 (H) 08/29/2011 1925   CALCIUM 6.8 (L) 08/07/2019 0754   CALCIUM 9.2 08/29/2011 1925   GFRNONAA 8 (L) 08/07/2019 0754   GFRNONAA 35 (L) 08/29/2011 1925   GFRAA 9 (L) 08/07/2019 0754   GFRAA 40 (L) 08/29/2011 1925   CrCl cannot be calculated (Patient's most recent lab result is older than the maximum 21 days allowed.).  COAG Lab Results  Component Value Date   INR 0.90 05/29/2017    Radiology No results found.    Assessment/Plan 1. Renal insufficiency Recommend:  At this time the patient does not have appropriate extremity access for dialysis  Patient should have a left brachial cephalic fistula created.  Although the cephalic vein in the antecubital fossa is quite nice it is only 2.5 mm up the arm so in the event  that the vein is not adequate a brachial axillary graft will be created.  The risks, benefits and alternative therapies were reviewed in detail with the patient.  All questions were answered.  The patient agrees to proceed with surgery.    2. Essential hypertension Continue antihypertensive medications as already ordered, these medications have been reviewed and there are no changes at this time.   3. Mixed hyperlipidemia Continue statin as ordered and reviewed, no changes at this time   4. Coronary artery disease of native artery of native heart with stable angina pectoris (HCC) Continue cardiac and antihypertensive medications as already ordered and reviewed, no changes at this time.  Continue statin as ordered and reviewed, no changes at this time  Nitrates PRN for chest pain    Hortencia Pilar, MD  09/02/2019 4:28 PM

## 2019-09-03 ENCOUNTER — Ambulatory Visit (INDEPENDENT_AMBULATORY_CARE_PROVIDER_SITE_OTHER): Payer: Medicare HMO

## 2019-09-03 ENCOUNTER — Ambulatory Visit (INDEPENDENT_AMBULATORY_CARE_PROVIDER_SITE_OTHER): Payer: Medicare HMO | Admitting: Vascular Surgery

## 2019-09-03 ENCOUNTER — Encounter (INDEPENDENT_AMBULATORY_CARE_PROVIDER_SITE_OTHER): Payer: Self-pay | Admitting: Vascular Surgery

## 2019-09-03 ENCOUNTER — Other Ambulatory Visit: Payer: Self-pay

## 2019-09-03 DIAGNOSIS — N186 End stage renal disease: Secondary | ICD-10-CM | POA: Diagnosis not present

## 2019-09-03 DIAGNOSIS — I25118 Atherosclerotic heart disease of native coronary artery with other forms of angina pectoris: Secondary | ICD-10-CM | POA: Diagnosis not present

## 2019-09-03 DIAGNOSIS — N289 Disorder of kidney and ureter, unspecified: Secondary | ICD-10-CM

## 2019-09-03 DIAGNOSIS — E782 Mixed hyperlipidemia: Secondary | ICD-10-CM

## 2019-09-03 DIAGNOSIS — I10 Essential (primary) hypertension: Secondary | ICD-10-CM

## 2019-09-04 ENCOUNTER — Encounter (INDEPENDENT_AMBULATORY_CARE_PROVIDER_SITE_OTHER): Payer: Self-pay | Admitting: Vascular Surgery

## 2019-09-25 ENCOUNTER — Telehealth (INDEPENDENT_AMBULATORY_CARE_PROVIDER_SITE_OTHER): Payer: Self-pay

## 2019-09-25 NOTE — Telephone Encounter (Signed)
Spoke with the patient to schedule her for a left brachial cephalic fistula with Dr. Delana Meyer. Patient was offered and declined  appt's on 10/02/19 and  10/16/19 stating it was too soon and she needed to be on a Friday as well. Patient is scheduled on 11/20/19 and will do a pre-op on 11/12/19 at 9:15 am at the [facility], covid testing on 11/18/19 between 8-1 pm a the [facility]. Pre-procedure instructions were discussed and will be mailed.

## 2019-10-05 ENCOUNTER — Other Ambulatory Visit: Payer: Medicare HMO

## 2019-10-07 ENCOUNTER — Other Ambulatory Visit: Payer: Medicare HMO

## 2019-10-14 ENCOUNTER — Other Ambulatory Visit: Payer: Medicare HMO

## 2019-11-02 DIAGNOSIS — N2581 Secondary hyperparathyroidism of renal origin: Secondary | ICD-10-CM | POA: Diagnosis not present

## 2019-11-02 DIAGNOSIS — R600 Localized edema: Secondary | ICD-10-CM | POA: Diagnosis not present

## 2019-11-02 DIAGNOSIS — R801 Persistent proteinuria, unspecified: Secondary | ICD-10-CM | POA: Diagnosis not present

## 2019-11-02 DIAGNOSIS — I10 Essential (primary) hypertension: Secondary | ICD-10-CM | POA: Diagnosis not present

## 2019-11-02 DIAGNOSIS — N185 Chronic kidney disease, stage 5: Secondary | ICD-10-CM | POA: Diagnosis not present

## 2019-11-11 ENCOUNTER — Other Ambulatory Visit (INDEPENDENT_AMBULATORY_CARE_PROVIDER_SITE_OTHER): Payer: Self-pay | Admitting: Nurse Practitioner

## 2019-11-12 ENCOUNTER — Encounter
Admission: RE | Admit: 2019-11-12 | Discharge: 2019-11-12 | Disposition: A | Discharge status: Home / Self Care | Payer: Medicare HMO | Source: Ambulatory Visit | Attending: Vascular Surgery | Admitting: Vascular Surgery

## 2019-11-12 ENCOUNTER — Other Ambulatory Visit: Payer: Self-pay

## 2019-11-12 DIAGNOSIS — Z01810 Encounter for preprocedural cardiovascular examination: Secondary | ICD-10-CM | POA: Diagnosis not present

## 2019-11-12 DIAGNOSIS — Z01818 Encounter for other preprocedural examination: Secondary | ICD-10-CM | POA: Diagnosis not present

## 2019-11-12 LAB — CBC WITH DIFFERENTIAL/PLATELET
Abs Immature Granulocytes: 0.03 10*3/uL (ref 0.00–0.07)
Basophils Absolute: 0.1 10*3/uL (ref 0.0–0.1)
Basophils Relative: 1 %
Eosinophils Absolute: 0.1 10*3/uL (ref 0.0–0.5)
Eosinophils Relative: 2 %
HCT: 29.9 % — ABNORMAL LOW (ref 36.0–46.0)
Hemoglobin: 9.7 g/dL — ABNORMAL LOW (ref 12.0–15.0)
Immature Granulocytes: 0 %
Lymphocytes Relative: 20 %
Lymphs Abs: 1.5 10*3/uL (ref 0.7–4.0)
MCH: 28.4 pg (ref 26.0–34.0)
MCHC: 32.4 g/dL (ref 30.0–36.0)
MCV: 87.7 fL (ref 80.0–100.0)
Monocytes Absolute: 0.5 10*3/uL (ref 0.1–1.0)
Monocytes Relative: 6 %
Neutro Abs: 5.4 10*3/uL (ref 1.7–7.7)
Neutrophils Relative %: 71 %
Platelets: 194 10*3/uL (ref 150–400)
RBC: 3.41 MIL/uL — ABNORMAL LOW (ref 3.87–5.11)
RDW: 18.6 % — ABNORMAL HIGH (ref 11.5–15.5)
WBC: 7.6 10*3/uL (ref 4.0–10.5)
nRBC: 0 % (ref 0.0–0.2)

## 2019-11-12 LAB — TYPE AND SCREEN
ABO/RH(D): A POS
Antibody Screen: NEGATIVE

## 2019-11-12 LAB — PROTIME-INR
INR: 0.9 (ref 0.8–1.2)
Prothrombin Time: 11.9 seconds (ref 11.4–15.2)

## 2019-11-12 LAB — BASIC METABOLIC PANEL
Anion gap: 10 (ref 5–15)
BUN: 72 mg/dL — ABNORMAL HIGH (ref 8–23)
CO2: 21 mmol/L — ABNORMAL LOW (ref 22–32)
Calcium: 7.1 mg/dL — ABNORMAL LOW (ref 8.9–10.3)
Chloride: 109 mmol/L (ref 98–111)
Creatinine, Ser: 5.05 mg/dL — ABNORMAL HIGH (ref 0.44–1.00)
GFR calc Af Amer: 9 mL/min — ABNORMAL LOW (ref >60)
GFR calc non Af Amer: 8 mL/min — ABNORMAL LOW (ref >60)
Glucose, Bld: 99 mg/dL (ref 70–99)
Potassium: 4.7 mmol/L (ref 3.5–5.1)
Sodium: 140 mmol/L (ref 135–145)

## 2019-11-12 LAB — APTT: aPTT: 31 seconds (ref 24–36)

## 2019-11-12 NOTE — Patient Instructions (Addendum)
Your procedure is scheduled on:11-20-2019 Report to Day Surgery on the 2nd floor of the [facility]. To find out your arrival time, please call [phone] between 1PM - 3PM on:11-19-2019  REMEMBER: Instructions that are not followed completely may result in serious medical risk, up to and including death; or upon the discretion of your surgeon and anesthesiologist your surgery may need to be rescheduled.  Do not eat food after midnight the night before surgery.  No gum chewing, lozengers or hard candies.  You may however, drink CLEAR liquids up to 2 hours before you are scheduled to arrive for your surgery. Do not drink anything within 2 hours of your scheduled arrival time.  Clear liquids include: - water  - apple juice without pulp - gatorade (not RED) - black coffee or tea (Do NOT add milk or creamers to the coffee or tea) Do NOT drink anything that is not on this list.   TAKE THESE MEDICATIONS THE MORNING OF SURGERY WITH A SIP OF WATER:  Amlodipine ( Norvasc) 10 mg  Isosorbide Monotrate ( Imdur ) 60 mg 24 hr Metoprolol ( Toprol XL) 200 mg 24 hr  lipitor 40 mg Allopurinol 100 mg  Follow recommendations from Cardiologist, Pulmonologist or PCP regarding stopping Aspirin, Coumadin, Plavix, Eliquis, Pradaxa, or Pletal.  One week prior to surgery: Stop Anti-inflammatories (NSAIDS) such as Advil, Aleve, Ibuprofen, Motrin, Naproxen, Naprosyn and Aspirin based products such as Excedrin, Goodys Powder, BC Powder. Stop ANY OVER THE COUNTER supplements until after surgery. (You may continue taking Tylenol, Vitamin D, Vitamin B, and multivitamin.)  No Alcohol for 24 hours before or after surgery.  No Smoking including e-cigarettes for 24 hours prior to surgery.  No chewable tobacco products for at least 6 hours prior to surgery.  No nicotine patches on the day of surgery.  Do not use any "recreational" drugs for at least a week prior to your surgery.  Please be advised that the  combination of cocaine and anesthesia may have negative outcomes, up to and including death. If you test positive for cocaine, your surgery will be cancelled.  On the morning of surgery brush your teeth with toothpaste and water, you may rinse your mouth with mouthwash if you wish. Do not swallow any toothpaste or mouthwash.  Do not wear jewelry, make-up, hairpins, clips or nail polish.  Do not wear lotions, powders, or perfumes.   Do not shave 48 hours prior to surgery.   Contact lenses, hearing aids and dentures may not be worn into surgery.  Do not bring valuables to the hospital. [facility] is not responsible for any missing/lost belongings or valuables.   Use CHG Soap or wipes as directed on instruction sheet.   Notify your doctor if there is any change in your medical condition (cold, fever, infection).  Wear comfortable clothing (specific to your surgery type) to the hospital.  Plan for stool softeners for home use; pain medications have a tendency to cause constipation. You can also help prevent constipation by eating foods high in fiber such as fruits and vegetables and drinking plenty of fluids as your diet allows.  After surgery, you can help prevent lung complications by doing breathing exercises.  Take deep breaths and cough every 1-2 hours. Your doctor may order a device called an Incentive Spirometer to help you take deep breaths. When coughing or sneezing, hold a pillow firmly against your incision with both hands. This is called "splinting." Doing this helps protect your incision. It also  decreases belly discomfort.  If you are being admitted to the hospital overnight, leave your suitcase in the car. After surgery it may be brought to your room.  If you are being discharged the day of surgery, you will not be allowed to drive home. You will need a responsible adult (18 years or older) to drive you home and stay with you that night.   If you are taking public  transportation, you will need to have a responsible adult (18 years or older) with you. Please confirm with your physician that it is acceptable to use public transportation.   Please call the [facility] at [phone] if you have any questions about these instructions.  Visitation Policy:  Patients undergoing a surgery or procedure may have one family member or support person with them as long as that person is not COVID-19 positive or experiencing its symptoms.  That person may remain in the waiting area during the procedure.  Inpatient Visitation Update:   In an effort to ensure the safety of our team members and our patients, we are implementing a change to our visitation policy:  Effective Monday, Aug. 9, at 7 a.m., inpatients will be allowed one support person.  o The support person may change daily.  o The support person must pass our screening, gel in and out, and wear a mask at all times, including in the patient's room.  o Patients must also wear a mask when staff or their support person are in the room.  o Masking is required regardless of vaccination status.  Systemwide, no visitors 17 or younger.

## 2019-11-17 ENCOUNTER — Telehealth (INDEPENDENT_AMBULATORY_CARE_PROVIDER_SITE_OTHER): Payer: Self-pay

## 2019-11-17 NOTE — Telephone Encounter (Signed)
I contacted the patient and let her know that her surgery has been rescheduled from 11/20/19 to 11/27/19 and her covid testing has been rescheduled to 11/25/19 between 8-1 pm at the [facility]. Pre-surgical instructions will be mailed out to the patient.

## 2019-11-18 ENCOUNTER — Inpatient Hospital Stay: Admission: RE | Admit: 2019-11-18 | Payer: Medicare HMO | Source: Ambulatory Visit

## 2019-11-25 ENCOUNTER — Other Ambulatory Visit: Payer: Self-pay

## 2019-11-25 ENCOUNTER — Other Ambulatory Visit
Admission: RE | Admit: 2019-11-25 | Discharge: 2019-11-25 | Disposition: A | Discharge status: Home / Self Care | Payer: Medicare HMO | Source: Ambulatory Visit | Attending: Vascular Surgery | Admitting: Vascular Surgery

## 2019-11-25 DIAGNOSIS — Z01812 Encounter for preprocedural laboratory examination: Secondary | ICD-10-CM | POA: Diagnosis not present

## 2019-11-25 DIAGNOSIS — Z20822 Contact with and (suspected) exposure to covid-19: Secondary | ICD-10-CM | POA: Insufficient documentation

## 2019-11-25 LAB — TYPE AND SCREEN
ABO/RH(D): A POS
Antibody Screen: NEGATIVE

## 2019-11-26 LAB — SARS CORONAVIRUS 2 (TAT 6-24 HRS): SARS Coronavirus 2: NEGATIVE

## 2019-11-27 ENCOUNTER — Ambulatory Visit
Admission: RE | Admit: 2019-11-27 | Discharge: 2019-11-27 | Disposition: A | Discharge status: Home / Self Care | Payer: Medicare HMO | Attending: Vascular Surgery | Admitting: Vascular Surgery

## 2019-11-27 ENCOUNTER — Other Ambulatory Visit: Payer: Self-pay

## 2019-11-27 ENCOUNTER — Encounter: Payer: Self-pay | Admitting: Vascular Surgery

## 2019-11-27 ENCOUNTER — Encounter
Admission: RE | Disposition: A | Discharge status: Home / Self Care | Payer: Self-pay | Source: Home / Self Care | Attending: Vascular Surgery

## 2019-11-27 ENCOUNTER — Ambulatory Visit: Payer: Medicare HMO | Admitting: Urgent Care

## 2019-11-27 DIAGNOSIS — N185 Chronic kidney disease, stage 5: Secondary | ICD-10-CM | POA: Diagnosis not present

## 2019-11-27 DIAGNOSIS — Z800 Family history of malignant neoplasm of digestive organs: Secondary | ICD-10-CM | POA: Insufficient documentation

## 2019-11-27 DIAGNOSIS — F17210 Nicotine dependence, cigarettes, uncomplicated: Secondary | ICD-10-CM | POA: Diagnosis not present

## 2019-11-27 DIAGNOSIS — I25118 Atherosclerotic heart disease of native coronary artery with other forms of angina pectoris: Secondary | ICD-10-CM | POA: Insufficient documentation

## 2019-11-27 DIAGNOSIS — Z880 Allergy status to penicillin: Secondary | ICD-10-CM | POA: Diagnosis not present

## 2019-11-27 DIAGNOSIS — M109 Gout, unspecified: Secondary | ICD-10-CM | POA: Insufficient documentation

## 2019-11-27 DIAGNOSIS — E1122 Type 2 diabetes mellitus with diabetic chronic kidney disease: Secondary | ICD-10-CM | POA: Diagnosis not present

## 2019-11-27 DIAGNOSIS — Z79899 Other long term (current) drug therapy: Secondary | ICD-10-CM | POA: Insufficient documentation

## 2019-11-27 DIAGNOSIS — I509 Heart failure, unspecified: Secondary | ICD-10-CM | POA: Diagnosis not present

## 2019-11-27 DIAGNOSIS — I340 Nonrheumatic mitral (valve) insufficiency: Secondary | ICD-10-CM | POA: Insufficient documentation

## 2019-11-27 DIAGNOSIS — E782 Mixed hyperlipidemia: Secondary | ICD-10-CM | POA: Diagnosis not present

## 2019-11-27 DIAGNOSIS — I132 Hypertensive heart and chronic kidney disease with heart failure and with stage 5 chronic kidney disease, or end stage renal disease: Secondary | ICD-10-CM | POA: Insufficient documentation

## 2019-11-27 DIAGNOSIS — Z8619 Personal history of other infectious and parasitic diseases: Secondary | ICD-10-CM | POA: Insufficient documentation

## 2019-11-27 DIAGNOSIS — Z96651 Presence of right artificial knee joint: Secondary | ICD-10-CM | POA: Insufficient documentation

## 2019-11-27 DIAGNOSIS — F17200 Nicotine dependence, unspecified, uncomplicated: Secondary | ICD-10-CM | POA: Diagnosis not present

## 2019-11-27 DIAGNOSIS — N186 End stage renal disease: Secondary | ICD-10-CM | POA: Insufficient documentation

## 2019-11-27 DIAGNOSIS — M1711 Unilateral primary osteoarthritis, right knee: Secondary | ICD-10-CM | POA: Diagnosis not present

## 2019-11-27 HISTORY — PX: AV FISTULA PLACEMENT: SHX1204

## 2019-11-27 SURGERY — ARTERIOVENOUS (AV) FISTULA CREATION
Anesthesia: General | Laterality: Left

## 2019-11-27 MED ORDER — DEXAMETHASONE SODIUM PHOSPHATE 10 MG/ML IJ SOLN
INTRAMUSCULAR | Status: DC | PRN
  Administered 2019-11-27: 4 mg via INTRAVENOUS

## 2019-11-27 MED ORDER — OXYCODONE HCL 5 MG/5ML PO SOLN: 5.0000 mg | Freq: Once | ORAL | Status: AC | PRN

## 2019-11-27 MED ORDER — ACETAMINOPHEN 10 MG/ML IV SOLN
INTRAVENOUS | Status: DC | PRN
  Administered 2019-11-27: 1000 mg via INTRAVENOUS

## 2019-11-27 MED ORDER — PROPOFOL 10 MG/ML IV BOLUS
INTRAVENOUS | Status: AC
  Filled 2019-11-27: qty 80

## 2019-11-27 MED ORDER — CHLORHEXIDINE GLUCONATE CLOTH 2 % EX PADS: 6.0000 | MEDICATED_PAD | Freq: Once | CUTANEOUS | Status: DC

## 2019-11-27 MED ORDER — FENTANYL CITRATE (PF) 100 MCG/2ML IJ SOLN
INTRAMUSCULAR | Status: AC
  Filled 2019-11-27: qty 2

## 2019-11-27 MED ORDER — OXYCODONE HCL 5 MG PO TABS
ORAL_TABLET | ORAL | Status: AC
  Filled 2019-11-27: qty 1

## 2019-11-27 MED ORDER — SODIUM CHLORIDE 0.9 % IV SOLN: INTRAVENOUS | Status: DC

## 2019-11-27 MED ORDER — OXYCODONE HCL 5 MG PO TABS
5.0000 mg | ORAL_TABLET | Freq: Once | ORAL | Status: AC | PRN
  Administered 2019-11-27: 5 mg via ORAL

## 2019-11-27 MED ORDER — ACETAMINOPHEN 10 MG/ML IV SOLN
INTRAVENOUS | Status: AC
  Filled 2019-11-27: qty 100

## 2019-11-27 MED ORDER — MIDAZOLAM HCL 2 MG/2ML IJ SOLN
INTRAMUSCULAR | Status: DC | PRN
  Administered 2019-11-27: 1 mg via INTRAVENOUS

## 2019-11-27 MED ORDER — ROCURONIUM BROMIDE 100 MG/10ML IV SOLN
INTRAVENOUS | Status: DC | PRN
  Administered 2019-11-27: 50 mg via INTRAVENOUS

## 2019-11-27 MED ORDER — BUPIVACAINE HCL (PF) 0.5 % IJ SOLN
INTRAMUSCULAR | Status: AC
  Filled 2019-11-27: qty 30

## 2019-11-27 MED ORDER — EPHEDRINE SULFATE 50 MG/ML IJ SOLN
INTRAMUSCULAR | Status: DC | PRN
  Administered 2019-11-27: 5 mg via INTRAVENOUS

## 2019-11-27 MED ORDER — FENTANYL CITRATE (PF) 100 MCG/2ML IJ SOLN
INTRAMUSCULAR | Status: DC | PRN
Start: 2019-11-27 — End: 2019-11-27
  Administered 2019-11-27 (×2): 50 ug via INTRAVENOUS

## 2019-11-27 MED ORDER — LIDOCAINE HCL (CARDIAC) PF 100 MG/5ML IV SOSY
PREFILLED_SYRINGE | INTRAVENOUS | Status: DC | PRN
  Administered 2019-11-27: 80 mg via INTRAVENOUS

## 2019-11-27 MED ORDER — PHENYLEPHRINE HCL (PRESSORS) 10 MG/ML IV SOLN
INTRAVENOUS | Status: DC | PRN
  Administered 2019-11-27: 50 ug via INTRAVENOUS
  Administered 2019-11-27 (×2): 100 ug via INTRAVENOUS

## 2019-11-27 MED ORDER — PROPOFOL 10 MG/ML IV BOLUS
INTRAVENOUS | Status: DC | PRN
  Administered 2019-11-27: 130 mg via INTRAVENOUS

## 2019-11-27 MED ORDER — ONDANSETRON HCL 4 MG/2ML IJ SOLN: 4.0000 mg | Freq: Four times a day (QID) | INTRAMUSCULAR | Status: DC | PRN

## 2019-11-27 MED ORDER — CHLORHEXIDINE GLUCONATE 0.12 % MT SOLN
OROMUCOSAL | Status: AC
  Administered 2019-11-27: 15 mL via OROMUCOSAL
  Filled 2019-11-27: qty 15

## 2019-11-27 MED ORDER — HYDROMORPHONE HCL 1 MG/ML IJ SOLN: 1.0000 mg | Freq: Once | INTRAMUSCULAR | Status: DC | PRN

## 2019-11-27 MED ORDER — SUGAMMADEX SODIUM 200 MG/2ML IV SOLN
INTRAVENOUS | Status: DC | PRN
  Administered 2019-11-27: 200 mg via INTRAVENOUS

## 2019-11-27 MED ORDER — BUPIVACAINE LIPOSOME 1.3 % IJ SUSP
INTRAMUSCULAR | Status: AC
  Filled 2019-11-27: qty 20

## 2019-11-27 MED ORDER — FAMOTIDINE 20 MG PO TABS
ORAL_TABLET | ORAL | Status: AC
  Administered 2019-11-27: 20 mg via ORAL
  Filled 2019-11-27: qty 1

## 2019-11-27 MED ORDER — HEPARIN SODIUM (PORCINE) 5000 UNIT/ML IJ SOLN
INTRAMUSCULAR | Status: AC
  Filled 2019-11-27: qty 1

## 2019-11-27 MED ORDER — FENTANYL CITRATE (PF) 100 MCG/2ML IJ SOLN: 25.0000 ug | INTRAMUSCULAR | Status: DC | PRN

## 2019-11-27 MED ORDER — MIDAZOLAM HCL 2 MG/2ML IJ SOLN
INTRAMUSCULAR | Status: AC
  Filled 2019-11-27: qty 2

## 2019-11-27 MED ORDER — ONDANSETRON HCL 4 MG/2ML IJ SOLN: 4.0000 mg | Freq: Once | INTRAMUSCULAR | Status: DC | PRN

## 2019-11-27 MED ORDER — VANCOMYCIN HCL IN DEXTROSE 1-5 GM/200ML-% IV SOLN
INTRAVENOUS | Status: AC
  Administered 2019-11-27: 1000 mg via INTRAVENOUS
  Filled 2019-11-27: qty 200

## 2019-11-27 MED ORDER — ORAL CARE MOUTH RINSE: 15.0000 mL | Freq: Once | OROMUCOSAL | Status: AC

## 2019-11-27 MED ORDER — VANCOMYCIN HCL IN DEXTROSE 1-5 GM/200ML-% IV SOLN: 1000.0000 mg | INTRAVENOUS | Status: AC

## 2019-11-27 MED ORDER — PHENYLEPHRINE HCL-NACL 10-0.9 MG/250ML-% IV SOLN
INTRAVENOUS | Status: DC | PRN
  Administered 2019-11-27: 50 ug/min via INTRAVENOUS

## 2019-11-27 MED ORDER — ONDANSETRON HCL 4 MG/2ML IJ SOLN
INTRAMUSCULAR | Status: DC | PRN
  Administered 2019-11-27: 4 mg via INTRAVENOUS

## 2019-11-27 MED ORDER — HYDROCODONE-ACETAMINOPHEN 5-325 MG PO TABS
1.0000 | ORAL_TABLET | Freq: Four times a day (QID) | ORAL | 0 refills | Status: DC | PRN
Start: 2019-11-27 — End: 2020-06-02

## 2019-11-27 MED ORDER — FAMOTIDINE 20 MG PO TABS: 20.0000 mg | ORAL_TABLET | Freq: Once | ORAL | Status: AC

## 2019-11-27 MED ORDER — PAPAVERINE HCL 30 MG/ML IJ SOLN
INTRAMUSCULAR | Status: AC
  Filled 2019-11-27: qty 2

## 2019-11-27 MED ORDER — CHLORHEXIDINE GLUCONATE 0.12 % MT SOLN: 15.0000 mL | Freq: Once | OROMUCOSAL | Status: AC

## 2019-11-27 SURGICAL SUPPLY — 58 items
ADH SKN CLS APL DERMABOND .7 (GAUZE/BANDAGES/DRESSINGS) ×1
APL PRP STRL LF DISP 70% ISPRP (MISCELLANEOUS) ×1
APPLIER CLIP 11 MED OPEN (CLIP)
APPLIER CLIP 9.375 SM OPEN (CLIP)
APR CLP MED 11 20 MLT OPN (CLIP)
APR CLP SM 9.3 20 MLT OPN (CLIP)
BAG DECANTER FOR FLEXI CONT (MISCELLANEOUS) ×3 IMPLANT
BLADE SURG SZ11 CARB STEEL (BLADE) ×3 IMPLANT
BOOT SUTURE AID YELLOW STND (SUTURE) ×3 IMPLANT
BRUSH SCRUB EZ  4% CHG (MISCELLANEOUS) ×2
BRUSH SCRUB EZ 4% CHG (MISCELLANEOUS) ×1 IMPLANT
CANISTER SUCT 1200ML W/VALVE (MISCELLANEOUS) ×3 IMPLANT
CHLORAPREP W/TINT 26 (MISCELLANEOUS) ×3 IMPLANT
CLIP APPLIE 11 MED OPEN (CLIP) IMPLANT
CLIP APPLIE 9.375 SM OPEN (CLIP) IMPLANT
COVER WAND RF STERILE (DRAPES) ×3 IMPLANT
DERMABOND ADVANCED (GAUZE/BANDAGES/DRESSINGS) ×2
DERMABOND ADVANCED .7 DNX12 (GAUZE/BANDAGES/DRESSINGS) ×1 IMPLANT
DRESSING SURGICEL FIBRLLR 1X2 (HEMOSTASIS) ×1 IMPLANT
DRSG SURGICEL FIBRILLAR 1X2 (HEMOSTASIS) ×3
ELECT CAUTERY BLADE 6.4 (BLADE) ×3 IMPLANT
ELECT REM PT RETURN 9FT ADLT (ELECTROSURGICAL) ×3
ELECTRODE REM PT RTRN 9FT ADLT (ELECTROSURGICAL) ×1 IMPLANT
GEL ULTRASOUND 20GR AQUASONIC (MISCELLANEOUS) IMPLANT
GLOVE BIO SURGEON STRL SZ7 (GLOVE) ×3 IMPLANT
GLOVE INDICATOR 7.5 STRL GRN (GLOVE) ×3 IMPLANT
GLOVE SURG SYN 8.0 (GLOVE) ×3 IMPLANT
GOWN STRL REUS W/ TWL LRG LVL3 (GOWN DISPOSABLE) ×2 IMPLANT
GOWN STRL REUS W/ TWL XL LVL3 (GOWN DISPOSABLE) ×1 IMPLANT
GOWN STRL REUS W/TWL LRG LVL3 (GOWN DISPOSABLE) ×6
GOWN STRL REUS W/TWL XL LVL3 (GOWN DISPOSABLE) ×3
IV NS 500ML (IV SOLUTION) ×3
IV NS 500ML BAXH (IV SOLUTION) ×1 IMPLANT
KIT TURNOVER KIT A (KITS) ×3 IMPLANT
LABEL OR SOLS (LABEL) ×3 IMPLANT
LOOP RED MAXI  1X406MM (MISCELLANEOUS) ×2
LOOP VESSEL MAXI 1X406 RED (MISCELLANEOUS) ×1 IMPLANT
LOOP VESSEL MINI 0.8X406 BLUE (MISCELLANEOUS) ×2 IMPLANT
LOOPS BLUE MINI 0.8X406MM (MISCELLANEOUS) ×4
NEEDLE FILTER BLUNT 18X 1/2SAF (NEEDLE) ×2
NEEDLE FILTER BLUNT 18X1 1/2 (NEEDLE) ×1 IMPLANT
NEEDLE HYPO 30X.5 LL (NEEDLE) IMPLANT
PACK EXTREMITY (MISCELLANEOUS) ×3 IMPLANT
PAD PREP 24X41 OB/GYN DISP (PERSONAL CARE ITEMS) ×3 IMPLANT
STOCKINETTE STRL 4IN 9604848 (GAUZE/BANDAGES/DRESSINGS) ×3 IMPLANT
SUT MNCRL+ 5-0 UNDYED PC-3 (SUTURE) ×1 IMPLANT
SUT MONOCRYL 5-0 (SUTURE) ×2
SUT PROLENE 6 0 BV (SUTURE) ×12 IMPLANT
SUT SILK 2 0 (SUTURE) ×3
SUT SILK 2-0 18XBRD TIE 12 (SUTURE) ×1 IMPLANT
SUT SILK 3 0 (SUTURE) ×3
SUT SILK 3-0 18XBRD TIE 12 (SUTURE) ×1 IMPLANT
SUT SILK 4 0 (SUTURE) ×3
SUT SILK 4-0 18XBRD TIE 12 (SUTURE) ×1 IMPLANT
SUT VIC AB 3-0 SH 27 (SUTURE) ×3
SUT VIC AB 3-0 SH 27X BRD (SUTURE) ×1 IMPLANT
SYR 20ML LL LF (SYRINGE) ×3 IMPLANT
SYR 3ML LL SCALE MARK (SYRINGE) ×3 IMPLANT

## 2019-11-27 NOTE — Progress Notes (Addendum)
   11/27/19 0735  Clinical Encounter Type  Visited With Patient;Family  Visit Type Initial  Referral From Chaplain  Consult/Referral To Chaplain  While rounding SDS, chaplain stopped in and spoke to Pt. Chaplain asked Pt how she was felling and she said fine. Chaplain said just want this over and she said yes. Pt said "You send up prayers." Chaplain asked if she could pray and she said yes. Chaplain prayed and after she finished praying the doctor entered the room and chaplain left.  While rounding in [place] waiting area, chaplain met Pt's husband, Merrilee Seashore. It was glad that she stopped by and spoke to him. He told chaplain that he was taking a nap. He also told chaplain that he has seizures.

## 2019-11-27 NOTE — H&P (Signed)
@LOGO @   MRN : [id number]  Anna Key is a 69 y.o. (1950/07/21) female who presents with chief complaint of need access.  History of Present Illness:   The patient presents to [facility] for creration of dialysis access. The  patient has chronic renal insufficiency stage V secondary to hypertension and diabetes. The patient's most recent creatinine clearance is less than 20. The patient volume status has not yet become an issue. Patient's blood pressures been relatively well controlled. There are mild uremic symptoms which appear to be relatively well tolerated at this time. The patient is right-handed.  Recently attempts were made at placing a PD catheter but secondary to extensive adhesions within the pelvis peritoneal dialysis catheter placement could not be performed and PD would not be an adequate form of renal replacement.  The patient has been considering the various methods of dialysis and wishes to proceed with hemodialysis and therefore creation of AV access.  The patient denies amaurosis fugax or recent TIA symptoms. There are no recent neurological changes noted. The patient denies claudication symptoms or rest pain symptoms. The patient denies history of DVT, PE or superficial thrombophlebitis. The patient denies recent episodes of angina or shortness of breath.   Current Meds  Medication Sig  . allopurinol (ZYLOPRIM) 100 MG tablet Take 100 mg by mouth every morning.   Marland Kitchen amLODipine (NORVASC) 10 MG tablet Take 10 mg by mouth every morning.   Marland Kitchen atorvastatin (LIPITOR) 40 MG tablet Take 40 mg by mouth every morning.   . Calcium 500-100 MG-UNIT CHEW Chew 500 mg by mouth.  . isosorbide mononitrate (IMDUR) 60 MG 24 hr tablet Take 60 mg by mouth every morning.   Marland Kitchen lisinopril (ZESTRIL) 20 MG tablet Take 20 mg by mouth every morning.   . metoprolol (TOPROL-XL) 200 MG 24 hr tablet Take 200 mg by mouth every morning.     Past Medical History:  Diagnosis Date  . Arthritis     osteoarthritis of right knee  . CHF (congestive heart failure) ([place])   . Chronic kidney disease    stage 3  . Gout   . Hepatitis C antibody test positive   . Hyperlipidemia   . Hypertension     Past Surgical History:  Procedure Laterality Date  . CARDIAC CATHETERIZATION    . LAPAROSCOPIC LYSIS OF ADHESIONS  08/12/2019   Procedure: LAPAROSCOPIC LYSIS OF ADHESIONS;  Surgeon: Jules Husbands, MD;  Location: [facility] ORS;  Service: General;;  . PARTIAL KNEE ARTHROPLASTY Right 06/11/2017   Procedure: UNICOMPARTMENTAL KNEE;  Surgeon: Corky Mull, MD;  Location: [facility] ORS;  Service: Orthopedics;  Laterality: Right;  . REPLACEMENT TOTAL KNEE Left 2012    Social History Social History   Tobacco Use  . Smoking status: Current Every Day Smoker    Packs/day: 0.50    Types: Cigarettes  . Smokeless tobacco: Never Used  Vaping Use  . Vaping Use: Never used  Substance Use Topics  . Alcohol use: Never  . Drug use: Not Currently    Comment: 30 years ago    Family History Family History  Problem Relation Age of Onset  . Colon cancer Mother     Allergies  Allergen Reactions  . Penicillins Hives and Rash    Has patient had a PCN reaction causing immediate rash, facial/tongue/throat swelling, SOB or lightheadedness with hypotension: Yes Has patient had a PCN reaction causing severe rash involving mucus membranes or skin necrosis: Yes--all over body Has patient had a PCN reaction  that required hospitalization: No  Has patient had a PCN reaction occurring within the last 10 years: No If all of the above answers are "NO", then may proceed with Cephalosporin use.      REVIEW OF SYSTEMS (Negative unless checked)  Constitutional: [] Weight loss  [] Fever  [] Chills Cardiac: [] Chest pain   [] Chest pressure   [] Palpitations   [] Shortness of breath when laying flat   [] Shortness of breath with exertion. Vascular:  [] Pain in legs with walking   [] Pain in legs at rest  [] History of DVT   [] Phlebitis    [] Swelling in legs   [] Varicose veins   [] Non-healing ulcers Pulmonary:   [] Uses home oxygen   [] Productive cough   [] Hemoptysis   [] Wheeze  [] COPD   [] Asthma Neurologic:  [] Dizziness   [] Seizures   [] History of stroke   [] History of TIA  [] Aphasia   [] Vissual changes   [] Weakness or numbness in arm   [] Weakness or numbness in leg Musculoskeletal:   [] Joint swelling   [] Joint pain   [] Low back pain Hematologic:  [] Easy bruising  [] Easy bleeding   [] Hypercoagulable state   [] Anemic Gastrointestinal:  [] Diarrhea   [] Vomiting  [] Gastroesophageal reflux/heartburn   [] Difficulty swallowing. Genitourinary:  [x] Chronic kidney disease   [] Difficult urination  [] Frequent urination   [] Blood in urine Skin:  [] Rashes   [] Ulcers  Psychological:  [] History of anxiety   []  History of major depression.  Physical Examination  Vitals:   11/27/19 0620  BP: (!) 145/93  Pulse: 70  Resp: 18  Temp: (!) 96.9 F (36.1 C)  TempSrc: Tympanic  SpO2: 98%  Weight: 93.6 kg  Height: 5' 4.5" (1.638 m)   Body mass index is 34.88 kg/m. Gen: WD/WN, NAD Head: [place]/AT, No temporalis wasting.  Ear/Nose/Throat: Hearing grossly intact, nares w/o erythema or drainage Eyes: PER, EOMI, sclera nonicteric.  Neck: Supple, no large masses.   Pulmonary:  Good air movement, no audible wheezing bilaterally, no use of accessory muscles.  Cardiac: RRR, no JVD Vascular: Vessel Right Left  Radial Palpable Palpable  Brachial Palpable Palpable  Gastrointestinal: Non-distended. No guarding/no peritoneal signs.  Musculoskeletal: M/S 5/5 throughout.  No deformity or atrophy.  Neurologic: CN 2-12 intact. Symmetrical.  Speech is fluent. Motor exam as listed above. Psychiatric: Judgment intact, Mood & affect appropriate for pt's clinical situation. Dermatologic: No rashes or ulcers noted.  No changes consistent with cellulitis.   CBC Lab Results  Component Value Date   WBC 7.6 11/12/2019   HGB 9.7 (L) 11/12/2019   HCT 29.9 (L)  11/12/2019   MCV 87.7 11/12/2019   PLT 194 11/12/2019    BMET    Component Value Date/Time   NA 140 11/12/2019 1001   NA 140 08/29/2011 1925   K 4.7 11/12/2019 1001   K 3.5 08/29/2011 1925   CL 109 11/12/2019 1001   CL 104 08/29/2011 1925   CO2 21 (L) 11/12/2019 1001   CO2 25 08/29/2011 1925   GLUCOSE 99 11/12/2019 1001   GLUCOSE 79 08/29/2011 1925   BUN 72 (H) 11/12/2019 1001   BUN 25 (H) 08/29/2011 1925   CREATININE 5.05 (H) 11/12/2019 1001   CREATININE 1.60 (H) 08/29/2011 1925   CALCIUM 7.1 (L) 11/12/2019 1001   CALCIUM 9.2 08/29/2011 1925   GFRNONAA 8 (L) 11/12/2019 1001   GFRNONAA 35 (L) 08/29/2011 1925   GFRAA 9 (L) 11/12/2019 1001   GFRAA 40 (L) 08/29/2011 1925   Estimated Creatinine Clearance: 12 mL/min (A) (by C-G formula  based on SCr of 5.05 mg/dL (H)).  COAG Lab Results  Component Value Date   INR 0.9 11/12/2019   INR 0.90 05/29/2017    Radiology No results found.   Assessment/Plan 1. Renal insufficiency Recommend:  At this time the patient does not have appropriate extremity access for dialysis  Patient should have a left brachial cephalic fistula created.  Although the cephalic vein in the antecubital fossa is quite nice it is only 2.5 mm up the arm so in the event that the vein is not adequate a brachial axillary graft will be created.  The risks, benefits and alternative therapies were reviewed in detail with the patient.  All questions were answered.  The patient agrees to proceed with surgery.    2. Essential hypertension Continue antihypertensive medications as already ordered, these medications have been reviewed and there are no changes at this time.   3. Mixed hyperlipidemia Continue statin as ordered and reviewed, no changes at this time   4. Coronary artery disease of native artery of native heart with stable angina pectoris (HCC) Continue cardiac and antihypertensive medications as already ordered and reviewed, no changes  at this time.  Continue statin as ordered and reviewed, no changes at this time  Nitrates PRN for chest pain   Hortencia Pilar, MD  11/27/2019 7:16 AM

## 2019-11-27 NOTE — Op Note (Signed)
     OPERATIVE NOTE   PROCEDURE: left brachial cephalic arteriovenous fistula placement  PRE-OPERATIVE DIAGNOSIS: End Stage Renal Disease  POST-OPERATIVE DIAGNOSIS: End Stage Renal Disease  SURGEON: Hortencia Pilar  ASSISTANT(S): Ms. Hezzie Bump  ANESTHESIA: general  ESTIMATED BLOOD LOSS: <50 cc  FINDING(S): 4 mm cephalic vein  SPECIMEN(S):  none  INDICATIONS:   [facility] Key is a 69 y.o. female who presents with end stage renal disease.  The patient is scheduled for left brachiocephalic arteriovenous fistula placement.  The patient is aware the risks include but are not limited to: bleeding, infection, steal syndrome, nerve damage, ischemic monomelic neuropathy, failure to mature, and need for additional procedures.  The patient is aware of the risks of the procedure and elects to proceed forward.  DESCRIPTION: After full informed written consent was obtained from the patient, the patient was brought back to the operating room and placed supine upon the operating table.  Prior to induction, the patient received IV antibiotics.   After obtaining adequate anesthesia, the patient was then prepped and draped in the standard fashion for a left arm access procedure.   A first assistant was required to provide a safe and appropriate environment for executing the surgery.  The assistant was integral in providing retraction, exposure, running suture providing suction and in the closing process.   A curvilinear incision was then created midway between the radial impulse and the cephalic vein. The cephalic vein was then identified and dissected circumferentially. It was marked with a surgical marker.    Attention was then turned to the brachial artery which was exposed through the same incision and looped proximally and distally. Side branches were controlled with 4-0 silk ties.  The distal segment of the vein was ligated with a  2-0 silk, and the vein was transected.  The proximal  segment was interrogated with serial dilators.  The vein accepted up to a 4 mm dilator without any difficulty. Heparinized saline was infused into the vein and clamped it with a small bulldog.  At this point, I reset my exposure of the brachial artery and controlled the artery with vessel loops proximally and distally.  An arteriotomy was then made with a #11 blade, and extended with a Potts scissor.  Heparinized saline was injected proximal and distal into the radial artery.  The vein was then approximated to the artery while the artery was in its native bed and subsequently the vein was beveled using Potts scissors. The vein was then sewn to the artery in an end-to-side configuration with a running stitch of 6-0 Prolene.  Prior to completing this anastomosis Flushing maneuvers were performed and the artery was allowed to forward and back bleed.  There was no evidence of clot from any vessels.  I completed the anastomosis in the usual fashion and then released all vessel loops and clamps.    There was good  thrill in the venous outflow, and there was 1+ palpable radial pulse.  At this point, I irrigated out the surgical wound.  There was no further active bleeding.  The subcutaneous tissue was reapproximated with a running stitch of 3-0 Vicryl.  The skin was then reapproximated with a running subcuticular stitch of 4-0 Vicryl.  The skin was then cleaned, dried, and reinforced with Dermabond.    The patient tolerated this procedure well.   COMPLICATIONS: None  CONDITION: [facility]  Office: [phone]   11/27/2019, 9:09 AM

## 2019-11-27 NOTE — Discharge Instructions (Signed)

## 2019-11-27 NOTE — Transfer of Care (Signed)
Immediate Anesthesia Transfer of Care Note  Patient: Anna Key  Procedure(s) Performed: ARTERIOVENOUS (AV) Brachiocephalic FISTULA CREATION, possible graft (Left )  Patient Location: PACU  Anesthesia Type:General  Level of Consciousness: awake, drowsy and patient cooperative  Airway & Oxygen Therapy: Patient Spontanous Breathing and Patient connected to face mask oxygen  Post-op Assessment: Report given to RN and Post -op Vital signs reviewed and stable  Post vital signs: Reviewed and stable  Last Vitals:  Vitals Value Taken Time  BP 117/76 11/27/19 0918  Temp    Pulse 76 11/27/19 0921  Resp 14 11/27/19 0921  SpO2 93 % 11/27/19 0921  Vitals shown include unvalidated device data.  Last Pain:  Vitals:   11/27/19 0916  TempSrc:   PainSc: (P) 0-No pain         Complications: No complications documented.

## 2019-11-27 NOTE — Anesthesia Preprocedure Evaluation (Addendum)
Anesthesia Evaluation  Patient identified by MRN, date of birth, ID band Patient awake    Reviewed: Allergy & Precautions, H&P , NPO status , Patient's Chart, lab work & pertinent test results  History of Anesthesia Complications Negative for: history of anesthetic complications  Airway Mallampati: II  TM Distance: >3 FB     Dental  (+) Chipped   Pulmonary neg sleep apnea, neg COPD, Current Smoker and Patient abstained from smoking.,    breath sounds clear to auscultation       Cardiovascular hypertension, (-) angina+ CAD and +CHF  (-) Past MI and (-) Cardiac Stents (-) dysrhythmias  Rhythm:regular Rate:Normal + Systolic murmurs Echo 0086:  Left ventricular hypertrophy - moderate to severe   Normal left ventricular systolic function, ejection fraction 65 to 76%   Diastolic dysfunction - grade II (elevated filling pressures)   Dilated left atrium - mild   Mitral regurgitation - mild  Normal right ventricular systolic function    Neuro/Psych negative neurological ROS  negative psych ROS   GI/Hepatic negative GI ROS, (+) Hepatitis -, C  Endo/Other  negative endocrine ROS  Renal/GU ESRFRenal disease     Musculoskeletal   Abdominal   Peds  Hematology negative hematology ROS (+)   Anesthesia Other Findings Past Medical History: No date: Arthritis     Comment:  osteoarthritis of right knee No date: CHF (congestive heart failure) (HCC) No date: Chronic kidney disease     Comment:  stage 3 No date: Gout No date: Hepatitis C antibody test positive No date: Hyperlipidemia No date: Hypertension  Past Surgical History: No date: CARDIAC CATHETERIZATION 08/12/2019: LAPAROSCOPIC LYSIS OF ADHESIONS     Comment:  Procedure: LAPAROSCOPIC LYSIS OF ADHESIONS;  Surgeon:               Jules Husbands, MD;  Location: [facility] ORS;  Service:               General;; 06/11/2017: PARTIAL KNEE ARTHROPLASTY; Right     Comment:   Procedure: UNICOMPARTMENTAL KNEE;  Surgeon: Corky Mull, MD;  Location: [facility] OR[facility];  Service: Orthopedics;                Laterality: Right; 2012: REPLACEMENT TOTAL KNEE; Left  BMI    Body Mass Index: 34.88 kg/m      Reproductive/Obstetrics negative OB ROS                            Anesthesia Physical Anesthesia Plan  ASA: III  Anesthesia Plan: General LMA   Post-op Pain Management:    Induction:   PONV Risk Score and Plan: Dexamethasone, Ondansetron, Treatment may vary due to age or medical condition and Midazolam  Airway Management Planned:   Additional Equipment:   Intra-op Plan:   Post-operative Plan:   Informed Consent: I have reviewed the patients History and Physical, chart, labs and discussed the procedure including the risks, benefits and alternatives for the proposed anesthesia with the patient or authorized representative who has indicated his/her understanding and acceptance.     Dental Advisory Given  Plan Discussed with: Anesthesiologist, CRNA and Surgeon  Anesthesia Plan Comments:         Anesthesia Quick Evaluation

## 2019-11-27 NOTE — Anesthesia Procedure Notes (Signed)
Procedure Name: Intubation Date/Time: 11/27/2019 7:43 AM Performed by: Lowry Bowl, CRNA Pre-anesthesia Checklist: Patient identified, Emergency Drugs available, Suction available and Patient being monitored Patient Re-evaluated:Patient Re-evaluated prior to induction Oxygen Delivery Method: Circle system utilized Preoxygenation: Pre-oxygenation with 100% oxygen Induction Type: IV induction Ventilation: Mask ventilation without difficulty Laryngoscope Size: 3 and McGraph Grade View: Grade I Tube type: Oral Tube size: 7.0 mm Number of attempts: 1 Airway Equipment and Method: Stylet and Video-laryngoscopy Placement Confirmation: ETT inserted through vocal cords under direct vision,  positive ETCO2 and breath sounds checked- equal and bilateral Secured at: 21 cm Tube secured with: Tape Dental Injury: Teeth and Oropharynx as per pre-operative assessment

## 2019-11-27 NOTE — Anesthesia Postprocedure Evaluation (Signed)
Anesthesia Post Note  Patient: Anna Key  Procedure(s) Performed: ARTERIOVENOUS (AV) Brachiocephalic FISTULA CREATION, possible graft (Left )  Patient location during evaluation: PACU Anesthesia Type: General Level of consciousness: awake and alert and oriented Pain management: pain level controlled Vital Signs Assessment: post-procedure vital signs reviewed and stable Respiratory status: spontaneous breathing, nonlabored ventilation and respiratory function stable Cardiovascular status: blood pressure returned to baseline and stable Postop Assessment: no signs of nausea or vomiting Anesthetic complications: no   No complications documented.   Last Vitals:  Vitals:   11/27/19 1021 11/27/19 1030  BP: 120/79 134/84  Pulse: 66 68  Resp: 18 16  Temp: (!) 36 C (!) 36.2 C  SpO2: 95% 97%    Last Pain:  Vitals:   11/27/19 1030  TempSrc: Temporal  PainSc:                  Dymon Summerhill

## 2019-11-28 ENCOUNTER — Encounter: Payer: Self-pay | Admitting: Vascular Surgery

## 2019-12-04 ENCOUNTER — Telehealth (INDEPENDENT_AMBULATORY_CARE_PROVIDER_SITE_OTHER): Payer: Self-pay

## 2019-12-04 NOTE — Telephone Encounter (Signed)
Patient called stating that she had a procedure (AV fistula creation) on 11/27/19 and she is having some itching and bruising. The patient wanted to know is this to be expected. I spoke with Eulogio Ditch NP and advise the bruising is to expected but she can take Claritin or Benadryl for the itching.

## 2019-12-07 DIAGNOSIS — R600 Localized edema: Secondary | ICD-10-CM | POA: Diagnosis not present

## 2019-12-07 DIAGNOSIS — N185 Chronic kidney disease, stage 5: Secondary | ICD-10-CM | POA: Diagnosis not present

## 2019-12-07 DIAGNOSIS — I10 Essential (primary) hypertension: Secondary | ICD-10-CM | POA: Diagnosis not present

## 2019-12-07 DIAGNOSIS — R801 Persistent proteinuria, unspecified: Secondary | ICD-10-CM | POA: Diagnosis not present

## 2019-12-07 DIAGNOSIS — N2581 Secondary hyperparathyroidism of renal origin: Secondary | ICD-10-CM | POA: Diagnosis not present

## 2019-12-07 DIAGNOSIS — B182 Chronic viral hepatitis C: Secondary | ICD-10-CM | POA: Diagnosis not present

## 2019-12-16 ENCOUNTER — Other Ambulatory Visit (INDEPENDENT_AMBULATORY_CARE_PROVIDER_SITE_OTHER): Payer: Self-pay | Admitting: Vascular Surgery

## 2019-12-16 DIAGNOSIS — Z9889 Other specified postprocedural states: Secondary | ICD-10-CM

## 2019-12-16 DIAGNOSIS — N186 End stage renal disease: Secondary | ICD-10-CM

## 2019-12-18 ENCOUNTER — Other Ambulatory Visit: Payer: Self-pay

## 2019-12-18 ENCOUNTER — Ambulatory Visit (INDEPENDENT_AMBULATORY_CARE_PROVIDER_SITE_OTHER): Payer: Medicare HMO

## 2019-12-18 ENCOUNTER — Ambulatory Visit (INDEPENDENT_AMBULATORY_CARE_PROVIDER_SITE_OTHER): Payer: Medicare HMO | Admitting: Nurse Practitioner

## 2019-12-18 ENCOUNTER — Encounter (INDEPENDENT_AMBULATORY_CARE_PROVIDER_SITE_OTHER): Payer: Self-pay | Admitting: Nurse Practitioner

## 2019-12-18 VITALS — BP 138/76 | HR 79 | Resp 16 | Ht 64.0 in | Wt 205.0 lb

## 2019-12-18 DIAGNOSIS — N186 End stage renal disease: Secondary | ICD-10-CM | POA: Diagnosis not present

## 2019-12-18 DIAGNOSIS — E782 Mixed hyperlipidemia: Secondary | ICD-10-CM

## 2019-12-18 DIAGNOSIS — Z9889 Other specified postprocedural states: Secondary | ICD-10-CM | POA: Diagnosis not present

## 2019-12-18 DIAGNOSIS — I10 Essential (primary) hypertension: Secondary | ICD-10-CM

## 2019-12-18 DIAGNOSIS — N184 Chronic kidney disease, stage 4 (severe): Secondary | ICD-10-CM

## 2019-12-18 DIAGNOSIS — Z720 Tobacco use: Secondary | ICD-10-CM

## 2019-12-19 ENCOUNTER — Encounter (INDEPENDENT_AMBULATORY_CARE_PROVIDER_SITE_OTHER): Payer: Self-pay | Admitting: Nurse Practitioner

## 2019-12-19 NOTE — Progress Notes (Addendum)
Subjective:    Patient ID: Anna Key, female    DOB: 24-Oct-1950, 69 y.o.   MRN: [id number] Chief Complaint  Patient presents with  . Follow-up    ultrasound    The patient presents today as a follow-up from a left brachiocephalic AV fistula placement.  The patient has not yet begun dialysis.  The patient notes some swelling post surgery however overall her hand feels well.  She denies any significant numbness or pain in her upper extremity.  She denies any signs symptoms of infection.  The patient notes that she has not yet begun dialysis and there is no timeframe for beginning at this time.  Today noninvasive studies show a new left brachiocephalic AV fistula with good flow throughout.  The flow volume is 1646 and there are some slightly elevated velocities in the confluence.  Was also noted that there is a 1.55 x 2.25 cm hematoma with no flow detected within the adjacent site to the anastomosis.  The distal radial artery does show retrograde flow.   Review of Systems  Hematological: Bruises/bleeds easily.  All other systems reviewed and are negative.      Objective:   Physical Exam Vitals reviewed.  HENT:     Head: Normocephalic.  Cardiovascular:     Rate and Rhythm: Normal rate.     Pulses: Normal pulses.     Arteriovenous access: left arteriovenous access is present.    Comments: Good thrill and bruit left brachiocephalic AV fistula Pulmonary:     Effort: Pulmonary effort is normal.  Neurological:     Mental Status: She is alert and oriented to person, place, and time.  Psychiatric:        Mood and Affect: Mood normal.        Behavior: Behavior normal.        Thought Content: Thought content normal.        Judgment: Judgment normal.     BP 138/76 (BP Location: Right Arm)   Pulse 79   Resp 16   Ht 5\' 4"  (1.626 m)   Wt 205 lb (93 kg)   BMI 35.19 kg/m   Past Medical History:  Diagnosis Date  . Arthritis    osteoarthritis of right knee  . CHF  (congestive heart failure) ([place])   . Chronic kidney disease    stage 3  . Gout   . Hepatitis C antibody test positive   . Hyperlipidemia   . Hypertension     Social History   Socioeconomic History  . Marital status: Married    Spouse name: Not on file  . Number of children: Not on file  . Years of education: Not on file  . Highest education level: Not on file  Occupational History  . Not on file  Tobacco Use  . Smoking status: Current Every Day Smoker    Packs/day: 0.50    Types: Cigarettes  . Smokeless tobacco: Never Used  Vaping Use  . Vaping Use: Never used  Substance and Sexual Activity  . Alcohol use: Never  . Drug use: Not Currently    Comment: 30 years ago  . Sexual activity: Not on file  Other Topics Concern  . Not on file  Social History Narrative  . Not on file   Social Determinants of Health   Financial Resource Strain:   . Difficulty of Paying Living Expenses: Not on file  Food Insecurity:   . Worried About Charity fundraiser in the Last  Year: Not on file  . Ran Out of Food in the Last Year: Not on file  Transportation Needs:   . Lack of Transportation (Medical): Not on file  . Lack of Transportation (Non-Medical): Not on file  Physical Activity:   . Days of Exercise per Week: Not on file  . Minutes of Exercise per Session: Not on file  Stress:   . Feeling of Stress : Not on file  Social Connections:   . Frequency of Communication with Friends and Family: Not on file  . Frequency of Social Gatherings with Friends and Family: Not on file  . Attends Religious Services: Not on file  . Active Member of Clubs or Organizations: Not on file  . Attends Archivist Meetings: Not on file  . Marital Status: Not on file  Intimate Partner Violence:   . Fear of Current or Ex-Partner: Not on file  . Emotionally Abused: Not on file  . Physically Abused: Not on file  . Sexually Abused: Not on file    Past Surgical History:  Procedure Laterality  Date  . AV FISTULA PLACEMENT Left 11/27/2019   Procedure: ARTERIOVENOUS (AV) Brachiocephalic FISTULA CREATION, possible graft;  Surgeon: Katha Cabal, MD;  Location: [facility] ORS;  Service: Vascular;  Laterality: Left;  . CARDIAC CATHETERIZATION    . LAPAROSCOPIC LYSIS OF ADHESIONS  08/12/2019   Procedure: LAPAROSCOPIC LYSIS OF ADHESIONS;  Surgeon: Jules Husbands, MD;  Location: [facility] OR[facility];  Service: General;;  . PARTIAL KNEE ARTHROPLASTY Right 06/11/2017   Procedure: UNICOMPARTMENTAL KNEE;  Surgeon: Corky Mull, MD;  Location: [facility] ORS;  Service: Orthopedics;  Laterality: Right;  . REPLACEMENT TOTAL KNEE Left 2012    Family History  Problem Relation Age of Onset  . Colon cancer Mother     Allergies  Allergen Reactions  . Penicillins Hives and Rash    Has patient had a PCN reaction causing immediate rash, facial/tongue/throat swelling, SOB or lightheadedness with hypotension: Yes Has patient had a PCN reaction causing severe rash involving mucus membranes or skin necrosis: Yes--all over body Has patient had a PCN reaction that required hospitalization: No  Has patient had a PCN reaction occurring within the last 10 years: No If all of the above answers are "NO", then may proceed with Cephalosporin use.     CBC Latest Ref Rng & Units 11/12/2019 06/12/2017 05/29/2017  WBC 4.0 - 10.5 K/uL 7.6 9.0 7.7  Hemoglobin 12.0 - 15.0 g/dL 9.7(L) 11.9(L) 13.1  Hematocrit 36 - 46 % 29.9(L) 36.0 38.9  Platelets 150 - 400 K/uL 194 193 252      CMP     Component Value Date/Time   NA 140 11/12/2019 1001   NA 140 08/29/2011 1925   K 4.7 11/12/2019 1001   K 3.5 08/29/2011 1925   CL 109 11/12/2019 1001   CL 104 08/29/2011 1925   CO2 21 (L) 11/12/2019 1001   CO2 25 08/29/2011 1925   GLUCOSE 99 11/12/2019 1001   GLUCOSE 79 08/29/2011 1925   BUN 72 (H) 11/12/2019 1001   BUN 25 (H) 08/29/2011 1925   CREATININE 5.05 (H) 11/12/2019 1001   CREATININE 1.60 (H) 08/29/2011 1925   CALCIUM 7.1 (L)  11/12/2019 1001   CALCIUM 9.2 08/29/2011 1925   GFRNONAA 8 (L) 11/12/2019 1001   GFRNONAA 35 (L) 08/29/2011 1925   GFRAA 9 (L) 11/12/2019 1001   GFRAA 40 (L) 08/29/2011 1925        Assessment & Plan:  1. Chronic kidney disease, stage 4 (severe) (HCC) Noninvasive studies show that the patient has a good flow volume however the fistula is not yet quite mature.  There are some elevated velocities near the confluence.  We will have the patient return in 2 months to evaluate her upper extremity access for suitability for dialysis.  2. Essential hypertension Continue antihypertensive medications as already ordered, these medications have been reviewed and there are no changes at this time.   3. Mixed hyperlipidemia Continue statin as ordered and reviewed, no changes at this time   4. Tobacco use Smoking cessation was discussed, 3-10 minutes spent on this topic specifically    Current Outpatient Medications on File Prior to Visit  Medication Sig Dispense Refill  . allopurinol (ZYLOPRIM) 100 MG tablet Take 100 mg by mouth every morning.     Marland Kitchen amLODipine (NORVASC) 10 MG tablet Take 10 mg by mouth every morning.     Marland Kitchen atorvastatin (LIPITOR) 40 MG tablet Take 40 mg by mouth every morning.     . Calcium 500-100 MG-UNIT CHEW Chew 500 mg by mouth.    . isosorbide mononitrate (IMDUR) 60 MG 24 hr tablet Take 60 mg by mouth every morning.     Marland Kitchen lisinopril (ZESTRIL) 20 MG tablet Take 20 mg by mouth every morning.     . metoprolol (TOPROL-XL) 200 MG 24 hr tablet Take 200 mg by mouth every morning.     . torsemide (DEMADEX) 20 MG tablet Take 40 mg by mouth daily.     . calcitRIOL (ROCALTROL) 0.25 MCG capsule Take 0.25 mcg by mouth daily.    Marland Kitchen HYDROcodone-acetaminophen (NORCO) 5-325 MG tablet Take 1-2 tablets by mouth every 6 (six) hours as needed for moderate pain or severe pain. (Patient not taking: Reported on 12/18/2019) 36 tablet 0   No current facility-administered medications on file prior  to visit.    There are no Patient Instructions on file for this visit. No follow-ups on file.   Kris Hartmann, NP

## 2019-12-30 DIAGNOSIS — M1711 Unilateral primary osteoarthritis, right knee: Secondary | ICD-10-CM | POA: Diagnosis not present

## 2019-12-30 DIAGNOSIS — R600 Localized edema: Secondary | ICD-10-CM | POA: Diagnosis not present

## 2019-12-30 DIAGNOSIS — R801 Persistent proteinuria, unspecified: Secondary | ICD-10-CM | POA: Diagnosis not present

## 2019-12-30 DIAGNOSIS — I25119 Atherosclerotic heart disease of native coronary artery with unspecified angina pectoris: Secondary | ICD-10-CM | POA: Diagnosis not present

## 2019-12-30 DIAGNOSIS — I119 Hypertensive heart disease without heart failure: Secondary | ICD-10-CM | POA: Diagnosis not present

## 2019-12-30 DIAGNOSIS — N185 Chronic kidney disease, stage 5: Secondary | ICD-10-CM | POA: Diagnosis not present

## 2019-12-30 DIAGNOSIS — I10 Essential (primary) hypertension: Secondary | ICD-10-CM | POA: Diagnosis not present

## 2019-12-30 DIAGNOSIS — Z1231 Encounter for screening mammogram for malignant neoplasm of breast: Secondary | ICD-10-CM | POA: Diagnosis not present

## 2020-01-21 DIAGNOSIS — I10 Essential (primary) hypertension: Secondary | ICD-10-CM | POA: Diagnosis not present

## 2020-01-21 DIAGNOSIS — F17210 Nicotine dependence, cigarettes, uncomplicated: Secondary | ICD-10-CM | POA: Diagnosis not present

## 2020-01-21 DIAGNOSIS — N185 Chronic kidney disease, stage 5: Secondary | ICD-10-CM | POA: Diagnosis not present

## 2020-01-21 DIAGNOSIS — R600 Localized edema: Secondary | ICD-10-CM | POA: Diagnosis not present

## 2020-01-21 DIAGNOSIS — R801 Persistent proteinuria, unspecified: Secondary | ICD-10-CM | POA: Diagnosis not present

## 2020-01-21 DIAGNOSIS — N2581 Secondary hyperparathyroidism of renal origin: Secondary | ICD-10-CM | POA: Diagnosis not present

## 2020-02-15 ENCOUNTER — Ambulatory Visit (INDEPENDENT_AMBULATORY_CARE_PROVIDER_SITE_OTHER): Payer: Medicare HMO | Admitting: Nurse Practitioner

## 2020-02-22 ENCOUNTER — Ambulatory Visit (INDEPENDENT_AMBULATORY_CARE_PROVIDER_SITE_OTHER): Payer: Medicare HMO | Admitting: Nurse Practitioner

## 2020-02-25 ENCOUNTER — Other Ambulatory Visit: Payer: Self-pay

## 2020-02-25 ENCOUNTER — Encounter (INDEPENDENT_AMBULATORY_CARE_PROVIDER_SITE_OTHER): Payer: Self-pay | Admitting: Vascular Surgery

## 2020-02-25 ENCOUNTER — Ambulatory Visit (INDEPENDENT_AMBULATORY_CARE_PROVIDER_SITE_OTHER): Payer: Medicare HMO | Admitting: Vascular Surgery

## 2020-02-25 VITALS — BP 167/94 | HR 81 | Resp 16 | Wt 198.0 lb

## 2020-02-25 DIAGNOSIS — I25118 Atherosclerotic heart disease of native coronary artery with other forms of angina pectoris: Secondary | ICD-10-CM

## 2020-02-25 DIAGNOSIS — N185 Chronic kidney disease, stage 5: Secondary | ICD-10-CM

## 2020-02-25 DIAGNOSIS — I10 Essential (primary) hypertension: Secondary | ICD-10-CM

## 2020-02-25 DIAGNOSIS — E782 Mixed hyperlipidemia: Secondary | ICD-10-CM

## 2020-02-28 NOTE — Progress Notes (Signed)
MRN : [id number]  Anna Key is a 70 y.o. ([birth year]) female who presents with chief complaint of  Chief Complaint  Patient presents with  . Follow-up    2 month check fistula  .  History of Present Illness:   The patient returns to the office for followup of their dialysis access. The function of the access has been stable. The patient denies hand pain or other symptoms consistent with steal phenomena.  No significant arm swelling.  The patient denies redness or swelling at the access site. The patient denies fever or chills at home or while on dialysis.  The patient denies amaurosis fugax or recent TIA symptoms. There are no recent neurological changes noted. The patient denies claudication symptoms or rest pain symptoms. The patient denies history of DVT, PE or superficial thrombophlebitis. The patient denies recent episodes of angina or shortness of breath.        Current Meds  Medication Sig  . allopurinol (ZYLOPRIM) 100 MG tablet Take 100 mg by mouth every morning.   Marland Kitchen amLODipine (NORVASC) 10 MG tablet Take 10 mg by mouth every morning.   Marland Kitchen atorvastatin (LIPITOR) 40 MG tablet Take 40 mg by mouth every morning.   . calcitRIOL (ROCALTROL) 0.25 MCG capsule Take 0.25 mcg by mouth daily.  . Calcium 500-100 MG-UNIT CHEW Chew 500 mg by mouth.  . isosorbide mononitrate (IMDUR) 60 MG 24 hr tablet Take 60 mg by mouth every morning.   Marland Kitchen lisinopril (ZESTRIL) 20 MG tablet Take 20 mg by mouth every morning.   . metoprolol (TOPROL-XL) 200 MG 24 hr tablet Take 200 mg by mouth every morning.   . torsemide (DEMADEX) 20 MG tablet Take 40 mg by mouth daily.     Past Medical History:  Diagnosis Date  . Arthritis    osteoarthritis of right knee  . CHF (congestive heart failure) ([place])   . Chronic kidney disease    stage 3  . Gout   . Hepatitis C antibody test positive   . Hyperlipidemia   . Hypertension     Past Surgical History:  Procedure Laterality Date  . AV  FISTULA PLACEMENT Left 11/27/2019   Procedure: ARTERIOVENOUS (AV) Brachiocephalic FISTULA CREATION, possible graft;  Surgeon: Katha Cabal, MD;  Location: [facility] ORS;  Service: Vascular;  Laterality: Left;  . CARDIAC CATHETERIZATION    . LAPAROSCOPIC LYSIS OF ADHESIONS  08/12/2019   Procedure: LAPAROSCOPIC LYSIS OF ADHESIONS;  Surgeon: Jules Husbands, MD;  Location: [facility] ORS;  Service: General;;  . PARTIAL KNEE ARTHROPLASTY Right 06/11/2017   Procedure: UNICOMPARTMENTAL KNEE;  Surgeon: Corky Mull, MD;  Location: [facility] ORS;  Service: Orthopedics;  Laterality: Right;  . REPLACEMENT TOTAL KNEE Left 2012    Social History Social History   Tobacco Use  . Smoking status: Current Every Day Smoker    Packs/day: 0.50    Types: Cigarettes  . Smokeless tobacco: Never Used  Vaping Use  . Vaping Use: Never used  Substance Use Topics  . Alcohol use: Never  . Drug use: Not Currently    Comment: 30 years ago    Family History Family History  Problem Relation Age of Onset  . Colon cancer Mother     Allergies  Allergen Reactions  . Penicillins Hives and Rash    Has patient had a PCN reaction causing immediate rash, facial/tongue/throat swelling, SOB or lightheadedness with hypotension: Yes Has patient had a PCN reaction causing severe rash involving mucus membranes or  skin necrosis: Yes--all over body Has patient had a PCN reaction that required hospitalization: No  Has patient had a PCN reaction occurring within the last 10 years: No If all of the above answers are "NO", then may proceed with Cephalosporin use.      REVIEW OF SYSTEMS (Negative unless checked)  Constitutional: [] Weight loss  [] Fever  [] Chills Cardiac: [] Chest pain   [] Chest pressure   [] Palpitations   [] Shortness of breath when laying flat   [] Shortness of breath with exertion. Vascular:  [] Pain in legs with walking   [] Pain in legs at rest  [] History of DVT   [] Phlebitis   [] Swelling in legs   [] Varicose veins    [] Non-healing ulcers Pulmonary:   [] Uses home oxygen   [] Productive cough   [] Hemoptysis   [] Wheeze  [] COPD   [] Asthma Neurologic:  [] Dizziness   [] Seizures   [] History of stroke   [] History of TIA  [] Aphasia   [] Vissual changes   [] Weakness or numbness in arm   [] Weakness or numbness in leg Musculoskeletal:   [] Joint swelling   [] Joint pain   [] Low back pain Hematologic:  [] Easy bruising  [] Easy bleeding   [] Hypercoagulable state   [] Anemic Gastrointestinal:  [] Diarrhea   [] Vomiting  [] Gastroesophageal reflux/heartburn   [] Difficulty swallowing. Genitourinary:  [x] Chronic kidney disease   [] Difficult urination  [] Frequent urination   [] Blood in urine Skin:  [] Rashes   [] Ulcers  Psychological:  [] History of anxiety   []  History of major depression.  Physical Examination  Vitals:   02/25/20 1537  BP: (!) 167/94  Pulse: 81  Resp: 16  Weight: 198 lb (89.8 kg)   Body mass index is 33.99 kg/m. Gen: WD/WN, NAD Head: Echo/AT, No temporalis wasting.  Ear/Nose/Throat: Hearing grossly intact, nares w/o erythema or drainage Eyes: PER, EOMI, sclera nonicteric.  Neck: Supple, no large masses.   Pulmonary:  Good air movement, no audible wheezing bilaterally, no use of accessory muscles.  Cardiac: RRR, no JVD Vascular: left brachial cephalic fistula good thrill good bruit Vessel Right Left  Radial Palpable Palpable  Brachial Palpable Palpable  Gastrointestinal: Non-distended. No guarding/no peritoneal signs.  Musculoskeletal: M/S 5/5 throughout.  No deformity or atrophy.  Neurologic: CN 2-12 intact. Symmetrical.  Speech is fluent. Motor exam as listed above. Psychiatric: Judgment intact, Mood & affect appropriate for pt's clinical situation. Dermatologic: No rashes or ulcers noted.  No changes consistent with cellulitis.   CBC Lab Results  Component Value Date   WBC 7.6 11/12/2019   HGB 9.7 (L) 11/12/2019   HCT 29.9 (L) 11/12/2019   MCV 87.7 11/12/2019   PLT 194 11/12/2019    BMET     Component Value Date/Time   NA 140 11/12/2019 1001   NA 140 08/29/2011 1925   K 4.7 11/12/2019 1001   K 3.5 08/29/2011 1925   CL 109 11/12/2019 1001   CL 104 08/29/2011 1925   CO2 21 (L) 11/12/2019 1001   CO2 25 08/29/2011 1925   GLUCOSE 99 11/12/2019 1001   GLUCOSE 79 08/29/2011 1925   BUN 72 (H) 11/12/2019 1001   BUN 25 (H) 08/29/2011 1925   CREATININE 5.05 (H) 11/12/2019 1001   CREATININE 1.60 (H) 08/29/2011 1925   CALCIUM 7.1 (L) 11/12/2019 1001   CALCIUM 9.2 08/29/2011 1925   GFRNONAA 8 (L) 11/12/2019 1001   GFRNONAA 35 (L) 08/29/2011 1925   GFRAA 9 (L) 11/12/2019 1001   GFRAA 40 (L) 08/29/2011 1925   CrCl cannot be calculated (Patient's most recent  lab result is older than the maximum 21 days allowed.).  COAG Lab Results  Component Value Date   INR 0.9 11/12/2019   INR 0.90 05/29/2017    Radiology No results found.   Assessment/Plan 1. Chronic kidney disease (CKD), stage V ([place]) Recommend:  The patient is doing well and currently has adequate dialysis access. The patient's dialysis center is not reporting any access issues. Flow pattern is stable when compared to the prior ultrasound.  The patient should have a duplex ultrasound of the dialysis access in 6 months. The patient will follow-up with me in the office after each ultrasound   - VAS [place] (AVF, AVG); Future  2. Coronary artery disease of native artery of native heart with stable angina pectoris (HCC) Continue cardiac and antihypertensive medications as already ordered and reviewed, no changes at this time.  Continue statin as ordered and reviewed, no changes at this time  Nitrates PRN for chest pain   3. Essential hypertension Continue antihypertensive medications as already ordered, these medications have been reviewed and there are no changes at this time.   4. Mixed hyperlipidemia Continue statin as ordered and reviewed, no changes at this time    Hortencia Pilar, MD  02/28/2020 1:59 PM

## 2020-03-08 DIAGNOSIS — N189 Chronic kidney disease, unspecified: Secondary | ICD-10-CM | POA: Insufficient documentation

## 2020-03-08 DIAGNOSIS — N2581 Secondary hyperparathyroidism of renal origin: Secondary | ICD-10-CM | POA: Diagnosis not present

## 2020-03-08 DIAGNOSIS — N185 Chronic kidney disease, stage 5: Secondary | ICD-10-CM | POA: Diagnosis not present

## 2020-03-08 DIAGNOSIS — D631 Anemia in chronic kidney disease: Secondary | ICD-10-CM | POA: Diagnosis not present

## 2020-03-08 DIAGNOSIS — Z8619 Personal history of other infectious and parasitic diseases: Secondary | ICD-10-CM | POA: Diagnosis not present

## 2020-03-08 DIAGNOSIS — R801 Persistent proteinuria, unspecified: Secondary | ICD-10-CM | POA: Diagnosis not present

## 2020-03-08 DIAGNOSIS — F17210 Nicotine dependence, cigarettes, uncomplicated: Secondary | ICD-10-CM | POA: Insufficient documentation

## 2020-03-08 DIAGNOSIS — R600 Localized edema: Secondary | ICD-10-CM | POA: Insufficient documentation

## 2020-03-08 DIAGNOSIS — I10 Essential (primary) hypertension: Secondary | ICD-10-CM | POA: Diagnosis not present

## 2020-03-14 IMAGING — MR MR KNEE*R* W/O CM
5 series · 40 of 40 positions shown · non-contrast
Comparison: None.

CLINICAL DATA: Generalized anterior knee pain for 1 year

EXAM:
MRI OF THE RIGHT KNEE WITHOUT CONTRAST
TECHNIQUE: Multiplanar, multisequence MR imaging of the knee was performed. No
intravenous contrast was administered.

[Series 3: PD fat-sat · axial · 3.0mm · 0.62mm/px · z∈[-101,+12]mm · 8 of 35 slices shown (1 of 3)]
[im 1/35]
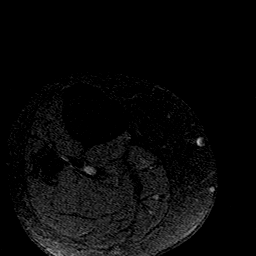
[im 5/35]
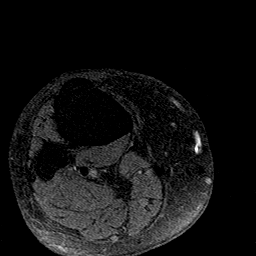
[im 10/35]
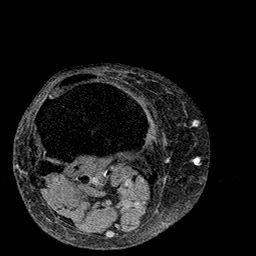
[im 15/35]
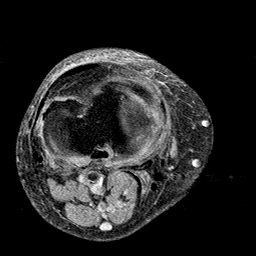
[im 20/35]
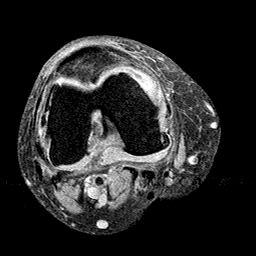
[im 25/35]
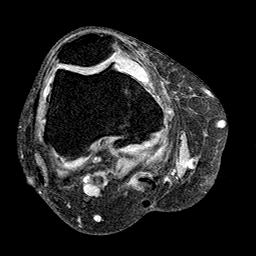
[im 30/35]
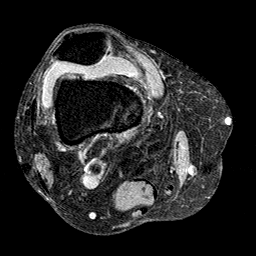
[im 35/35]
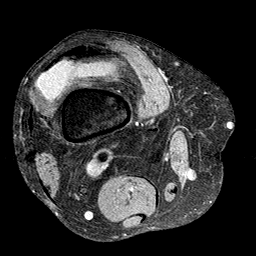

[Series 4: T2 fat-sat · coronal · 3.0mm · 0.62mm/px · 7 of 31 slices shown]
[im 1/31]
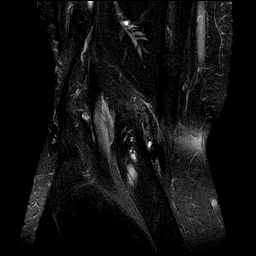
[im 6/31]
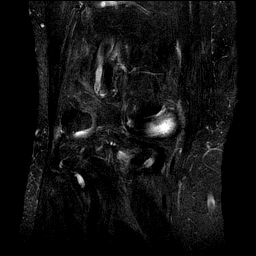
[im 11/31]
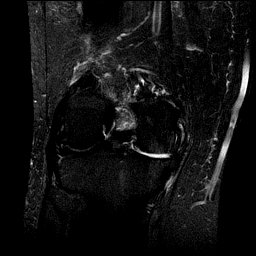
[im 16/31]
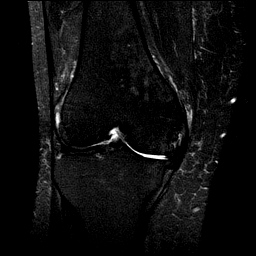
[im 21/31]
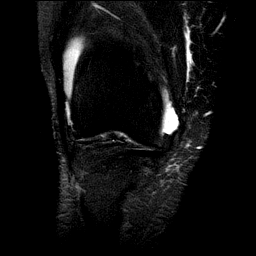
[im 26/31]
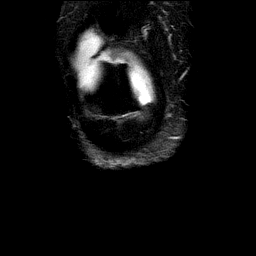
[im 31/31]
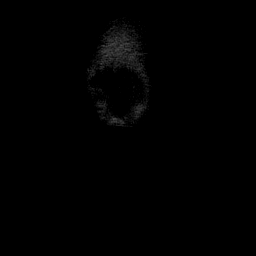

[Series 5: T1 · coronal · 3.0mm · 0.62mm/px · 8 of 31 slices shown]
[im 1/31]
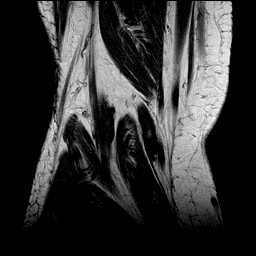
[im 5/31]
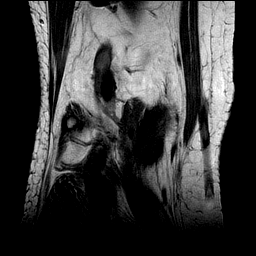
[im 9/31]
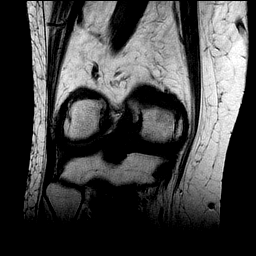
[im 13/31]
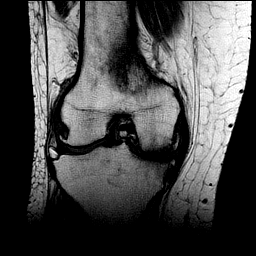
[im 18/31]
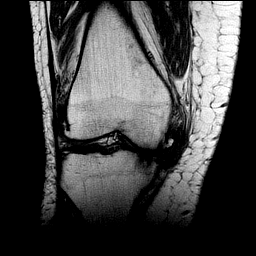
[im 22/31]
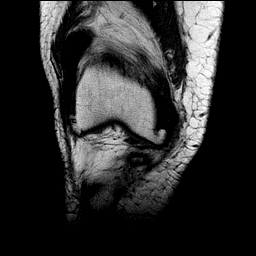
[im 26/31]
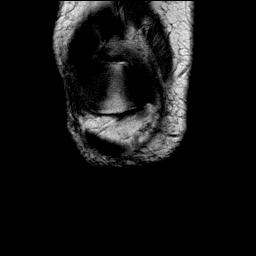
[im 31/31]
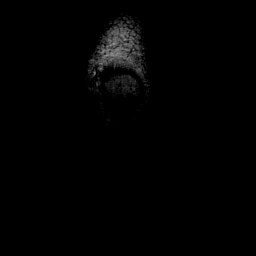

[Series 6: PD fat-sat · coronal · 3.0mm · 0.62mm/px · 8 of 31 slices shown (2 of 3)]
[im 1/31]
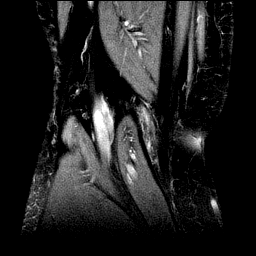
[im 5/31]
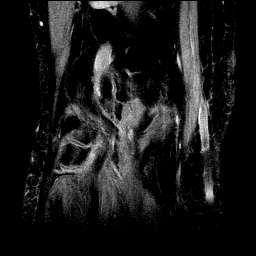
[im 9/31]
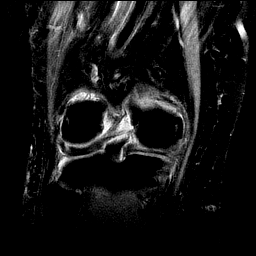
[im 13/31]
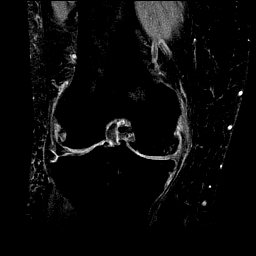
[im 18/31]
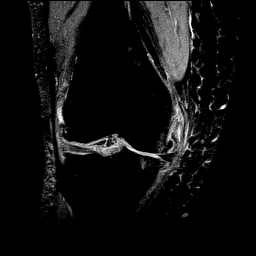
[im 22/31]
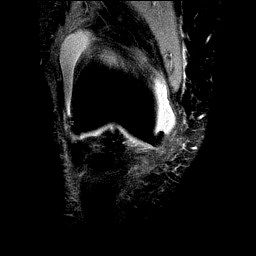
[im 26/31]
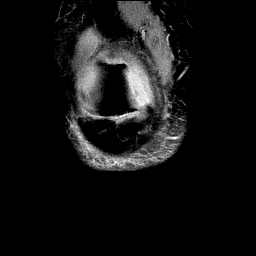
[im 31/31]
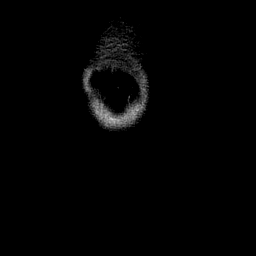

[Series 7: PD fat-sat · sagittal · 3.0mm · 0.62mm/px · 9 of 35 slices shown (3 of 3)]
[im 1/35]
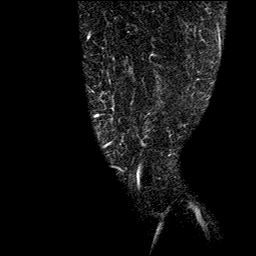
[im 5/35]
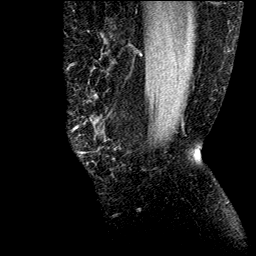
[im 9/35]
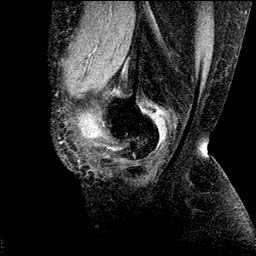
[im 13/35]
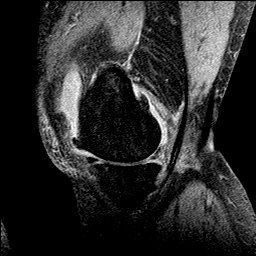
[im 18/35]
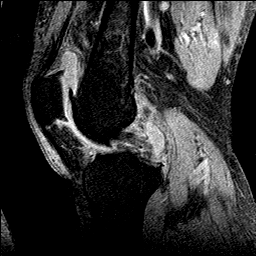
[im 22/35]
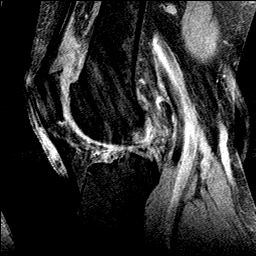
[im 26/35]
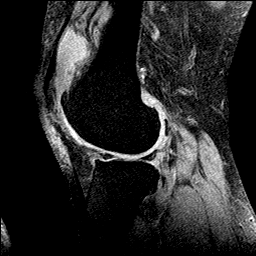
[im 30/35]
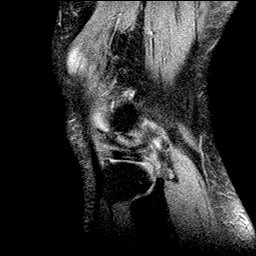
[im 35/35]
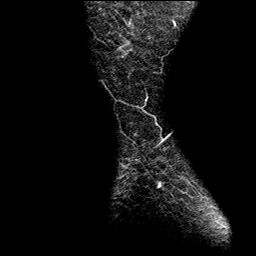

[40 of 40 positions shown; findings below may reference images not displayed]

FINDINGS: MENISCI

Medial meniscus: Radial tear of the posterior horn and body of the
medial meniscus.

Lateral meniscus:  Intact.

LIGAMENTS

Cruciates:  Intact ACL and PCL.

Collaterals: Medial collateral ligament is intact. Lateral
collateral ligament complex is intact.

CARTILAGE

Patellofemoral: Partial-thickness cartilage loss of the
patellofemoral compartment.

Medial: Extensive full-thickness cartilage loss of the medial
femorotibial compartment with marginal osteophytes and mild
subchondral reactive marrow changes.

Lateral: Partial-thickness cartilage loss of the lateral
femorotibial compartment.

Joint: Large joint effusion. Mild edema in Hoffa's fat. No plical
thickening.

Popliteal Fossa:  Tiny Baker's cyst.  Intact popliteus tendon.

Extensor Mechanism: Intact quadriceps tendon. Intact patellar
tendon. Intact medial patellar retinaculum. Intact lateral patellar
retinaculum. Intact MPFL.

Bones: No other focal marrow signal abnormality. No fracture or
dislocation.

Other: No fluid collection or hematoma.  Muscles are normal.
IMPRESSION: 1. Radial tear of the posterior horn and body of the medial
meniscus.
2. Tricompartmental cartilage abnormalities as described above.

## 2020-04-20 DIAGNOSIS — N2581 Secondary hyperparathyroidism of renal origin: Secondary | ICD-10-CM | POA: Diagnosis not present

## 2020-04-20 DIAGNOSIS — I10 Essential (primary) hypertension: Secondary | ICD-10-CM | POA: Diagnosis not present

## 2020-04-20 DIAGNOSIS — R801 Persistent proteinuria, unspecified: Secondary | ICD-10-CM | POA: Diagnosis not present

## 2020-04-20 DIAGNOSIS — D631 Anemia in chronic kidney disease: Secondary | ICD-10-CM | POA: Diagnosis not present

## 2020-04-20 DIAGNOSIS — N185 Chronic kidney disease, stage 5: Secondary | ICD-10-CM | POA: Diagnosis not present

## 2020-04-20 DIAGNOSIS — F17210 Nicotine dependence, cigarettes, uncomplicated: Secondary | ICD-10-CM | POA: Diagnosis not present

## 2020-04-20 DIAGNOSIS — R600 Localized edema: Secondary | ICD-10-CM | POA: Diagnosis not present

## 2020-05-16 ENCOUNTER — Encounter: Payer: Self-pay | Admitting: Oncology

## 2020-05-16 ENCOUNTER — Encounter (INDEPENDENT_AMBULATORY_CARE_PROVIDER_SITE_OTHER): Payer: Self-pay

## 2020-05-16 ENCOUNTER — Inpatient Hospital Stay: Payer: Medicare HMO

## 2020-05-16 ENCOUNTER — Inpatient Hospital Stay: Payer: Medicare HMO | Attending: Oncology | Admitting: Oncology

## 2020-05-16 VITALS — BP 115/77 | HR 67 | Temp 97.2°F | Resp 16 | Wt 197.0 lb

## 2020-05-16 DIAGNOSIS — D631 Anemia in chronic kidney disease: Secondary | ICD-10-CM | POA: Diagnosis not present

## 2020-05-16 DIAGNOSIS — Z79899 Other long term (current) drug therapy: Secondary | ICD-10-CM | POA: Diagnosis not present

## 2020-05-16 DIAGNOSIS — I120 Hypertensive chronic kidney disease with stage 5 chronic kidney disease or end stage renal disease: Secondary | ICD-10-CM | POA: Diagnosis not present

## 2020-05-16 DIAGNOSIS — D649 Anemia, unspecified: Secondary | ICD-10-CM | POA: Diagnosis not present

## 2020-05-16 DIAGNOSIS — N185 Chronic kidney disease, stage 5: Secondary | ICD-10-CM | POA: Diagnosis not present

## 2020-05-16 DIAGNOSIS — Z800 Family history of malignant neoplasm of digestive organs: Secondary | ICD-10-CM

## 2020-05-16 DIAGNOSIS — F17210 Nicotine dependence, cigarettes, uncomplicated: Secondary | ICD-10-CM | POA: Diagnosis not present

## 2020-05-16 LAB — CBC WITH DIFFERENTIAL/PLATELET
Abs Immature Granulocytes: 0.03 10*3/uL (ref 0.00–0.07)
Basophils Absolute: 0 10*3/uL (ref 0.0–0.1)
Basophils Relative: 1 %
Eosinophils Absolute: 0.1 10*3/uL (ref 0.0–0.5)
Eosinophils Relative: 1 %
HCT: 27.2 % — ABNORMAL LOW (ref 36.0–46.0)
Hemoglobin: 7.7 g/dL — ABNORMAL LOW (ref 12.0–15.0)
Immature Granulocytes: 0 %
Lymphocytes Relative: 17 %
Lymphs Abs: 1.3 10*3/uL (ref 0.7–4.0)
MCH: 23.8 pg — ABNORMAL LOW (ref 26.0–34.0)
MCHC: 28.3 g/dL — ABNORMAL LOW (ref 30.0–36.0)
MCV: 84.2 fL (ref 80.0–100.0)
Monocytes Absolute: 0.5 10*3/uL (ref 0.1–1.0)
Monocytes Relative: 7 %
Neutro Abs: 5.4 10*3/uL (ref 1.7–7.7)
Neutrophils Relative %: 74 %
Platelets: 243 10*3/uL (ref 150–400)
RBC: 3.23 MIL/uL — ABNORMAL LOW (ref 3.87–5.11)
RDW: 20.2 % — ABNORMAL HIGH (ref 11.5–15.5)
WBC: 7.4 10*3/uL (ref 4.0–10.5)
nRBC: 0 % (ref 0.0–0.2)

## 2020-05-16 LAB — IRON AND TIBC
Iron: 25 ug/dL — ABNORMAL LOW (ref 28–170)
Saturation Ratios: 6 % — ABNORMAL LOW (ref 10.4–31.8)
TIBC: 448 ug/dL (ref 250–450)
UIBC: 423 ug/dL

## 2020-05-16 LAB — COMPREHENSIVE METABOLIC PANEL
ALT: 12 U/L (ref 0–44)
AST: 16 U/L (ref 15–41)
Albumin: 3.1 g/dL — ABNORMAL LOW (ref 3.5–5.0)
Alkaline Phosphatase: 56 U/L (ref 38–126)
Anion gap: 14 (ref 5–15)
BUN: 82 mg/dL — ABNORMAL HIGH (ref 8–23)
CO2: 31 mmol/L (ref 22–32)
Calcium: 7.1 mg/dL — ABNORMAL LOW (ref 8.9–10.3)
Chloride: 94 mmol/L — ABNORMAL LOW (ref 98–111)
Creatinine, Ser: 6.72 mg/dL — ABNORMAL HIGH (ref 0.44–1.00)
GFR, Estimated: 6 mL/min — ABNORMAL LOW (ref >60)
Glucose, Bld: 106 mg/dL — ABNORMAL HIGH (ref 70–99)
Potassium: 4 mmol/L (ref 3.5–5.1)
Sodium: 139 mmol/L (ref 135–145)
Total Bilirubin: 0.3 mg/dL (ref 0.3–1.2)
Total Protein: 7.4 g/dL (ref 6.5–8.1)

## 2020-05-16 LAB — LACTATE DEHYDROGENASE: LDH: 201 U/L — ABNORMAL HIGH (ref 98–192)

## 2020-05-16 LAB — RETICULOCYTES
Immature Retic Fract: 29.9 % — ABNORMAL HIGH (ref 2.3–15.9)
RBC.: 3.16 MIL/uL — ABNORMAL LOW (ref 3.87–5.11)
Retic Count, Absolute: 108.1 10*3/uL (ref 19.0–186.0)
Retic Ct Pct: 3.4 % — ABNORMAL HIGH (ref 0.4–3.1)

## 2020-05-16 LAB — FOLATE: Folate: 14.3 ng/mL (ref >5.9)

## 2020-05-16 LAB — VITAMIN B12: Vitamin B-12: 393 pg/mL (ref 180–914)

## 2020-05-16 LAB — FERRITIN: Ferritin: 12 ng/mL (ref 11–307)

## 2020-05-16 LAB — TSH: TSH: 1.214 u[IU]/mL (ref 0.350–4.500)

## 2020-05-16 NOTE — Progress Notes (Signed)
Hematology/Oncology Consult note [facility] Telephone:([phone] [phone]  Patient Care Team: Kirk Ruths, MD as PCP - General (Internal Medicine)   Name of the patient: Anna Key  [id number]  02-22-1950    Reason for referral-anemia   Referring physician-Dr. Murlean Iba  Date of visit: 05/16/20   History of presenting illness-patient is a 70 year old female with a past medical history significant for hypertension hyperlipidemia, coronary artery disease and stage V chronic kidney disease not presently on dialysis.  She has been referred to [place] for anemia.On review of her outside labs CBC showed H&H of 7.7/25.7 with an MCV of 85.4 on 04/20/2020.  Her hemoglobin has been gradually drifting down from 12 in July 2020 over the last 2 years.  White count and platelets have been normal.  Patient reports having a good appetite and remains independent of her ADLs.  She reports occasional fatigue.  ECOG PS- 1  Pain scale- 0   Review of systems- Review of Systems  Constitutional: Positive for malaise/fatigue.  Cardiovascular: Positive for leg swelling.    Allergies  Allergen Reactions  . Penicillins Hives and Rash    Has patient had a PCN reaction causing immediate rash, facial/tongue/throat swelling, SOB or lightheadedness with hypotension: Yes Has patient had a PCN reaction causing severe rash involving mucus membranes or skin necrosis: Yes--all over body Has patient had a PCN reaction that required hospitalization: No  Has patient had a PCN reaction occurring within the last 10 years: No If all of the above answers are "NO", then may proceed with Cephalosporin use.     Patient Active Problem List   Diagnosis Date Noted  . Anemia in chronic kidney disease 03/08/2020  . Cigarette smoker 03/08/2020  . Edema of lower extremity 03/08/2020  . History of hepatitis C 03/08/2020  . Hyperparathyroidism due to renal insufficiency ([place])  03/08/2020  . Persistent proteinuria 03/08/2020  . Renal insufficiency 09/02/2019  . Essential hypertension 09/02/2019  . Hyperlipidemia 09/02/2019  . CAD (coronary artery disease) 09/02/2019  . Hypocalcemia 06/04/2019  . Chronic viral hepatitis C ([place]) 10/22/2017  . Status post right partial knee replacement 06/11/2017  . Healthcare maintenance 04/04/2017  . LVH (left ventricular hypertrophy) due to hypertensive disease 04/04/2017  . Chronic kidney disease, stage 4 (severe) ([place]) 04/04/2017  . Primary osteoarthritis of right knee 06/27/2015  . Shoulder pain, left 01/19/2014  . Chronic kidney disease (CKD), stage V ([place]) 06/12/2013  . Gout 06/05/2012  . Tobacco use 06/05/2012  . Primary localized osteoarthrosis, lower leg 06/09/2010  . Atherosclerotic heart disease of native coronary artery without angina pectoris 06/02/2009     Past Medical History:  Diagnosis Date  . Arthritis    osteoarthritis of right knee  . CHF (congestive heart failure) ([place])   . Chronic kidney disease    stage 3  . Gout   . Hepatitis C antibody test positive   . Hyperlipidemia   . Hypertension      Past Surgical History:  Procedure Laterality Date  . AV FISTULA PLACEMENT Left 11/27/2019   Procedure: ARTERIOVENOUS (AV) Brachiocephalic FISTULA CREATION, possible graft;  Surgeon: Katha Cabal, MD;  Location: [facility] ORS;  Service: Vascular;  Laterality: Left;  . CARDIAC CATHETERIZATION    . LAPAROSCOPIC LYSIS OF ADHESIONS  08/12/2019   Procedure: LAPAROSCOPIC LYSIS OF ADHESIONS;  Surgeon: Jules Husbands, MD;  Location: [facility] ORS;  Service: General;;  . PARTIAL KNEE ARTHROPLASTY Right 06/11/2017   Procedure: UNICOMPARTMENTAL KNEE;  Surgeon:  Poggi, Marshall Cork, MD;  Location: [facility] ORS;  Service: Orthopedics;  Laterality: Right;  . REPLACEMENT TOTAL KNEE Left 2012    Social History   Socioeconomic History  . Marital status: Married    Spouse name: Not on file  . Number of children: Not on file  .  Years of education: Not on file  . Highest education level: Not on file  Occupational History  . Not on file  Tobacco Use  . Smoking status: Current Every Day Smoker    Packs/day: 0.50    Types: Cigarettes  . Smokeless tobacco: Never Used  Vaping Use  . Vaping Use: Never used  Substance and Sexual Activity  . Alcohol use: Never  . Drug use: Not Currently    Comment: 30 years ago  . Sexual activity: Not on file  Other Topics Concern  . Not on file  Social History Narrative  . Not on file   Social Determinants of Health   Financial Resource Strain: Not on file  Food Insecurity: Not on file  Transportation Needs: Not on file  Physical Activity: Not on file  Stress: Not on file  Social Connections: Not on file  Intimate Partner Violence: Not on file     Family History  Problem Relation Age of Onset  . Colon cancer Mother      Current Outpatient Medications:  .  allopurinol (ZYLOPRIM) 100 MG tablet, Take 100 mg by mouth every morning. , Disp: , Rfl:  .  amLODipine (NORVASC) 10 MG tablet, Take 10 mg by mouth every morning. , Disp: , Rfl:  .  atorvastatin (LIPITOR) 40 MG tablet, Take 40 mg by mouth every morning. , Disp: , Rfl:  .  calcitRIOL (ROCALTROL) 0.25 MCG capsule, Take 0.25 mcg by mouth daily., Disp: , Rfl:  .  Calcium 500-100 MG-UNIT CHEW, Chew 500 mg by mouth., Disp: , Rfl:  .  HYDROcodone-acetaminophen (NORCO) 5-325 MG tablet, Take 1-2 tablets by mouth every 6 (six) hours as needed for moderate pain or severe pain., Disp: 36 tablet, Rfl: 0 .  isosorbide mononitrate (IMDUR) 60 MG 24 hr tablet, Take 60 mg by mouth every morning. , Disp: , Rfl:  .  lisinopril (ZESTRIL) 20 MG tablet, Take 20 mg by mouth every morning. , Disp: , Rfl:  .  metoprolol (TOPROL-XL) 200 MG 24 hr tablet, Take 200 mg by mouth every morning. , Disp: , Rfl:  .  sodium bicarbonate 650 MG tablet, Take 2 tablets by mouth in the morning and at bedtime., Disp: , Rfl:  .  torsemide (DEMADEX) 20 MG  tablet, Take 40 mg by mouth daily. , Disp: , Rfl:    Physical exam:  Vitals:   05/16/20 1308  BP: 115/77  Pulse: 67  Resp: 16  Temp: (!) 97.2 F (36.2 C)  TempSrc: Tympanic  SpO2: 96%  Weight: 197 lb (89.4 kg)   Physical Exam Cardiovascular:     Rate and Rhythm: Normal rate and regular rhythm.     Heart sounds: Normal heart sounds.  Pulmonary:     Effort: Pulmonary effort is normal.     Breath sounds: Normal breath sounds.  Musculoskeletal:     Right lower leg: Edema present.     Left lower leg: Edema present.  Skin:    General: Skin is warm and dry.  Neurological:     Mental Status: She is alert and oriented to person, place, and time.        CMP Latest Ref  Rng & Units 05/16/2020  Glucose 70 - 99 mg/dL 106(H)  BUN 8 - 23 mg/dL 82(H)  Creatinine 0.44 - 1.00 mg/dL 6.72(H)  Sodium 135 - 145 mmol/L 139  Potassium 3.5 - 5.1 mmol/L 4.0  Chloride 98 - 111 mmol/L 94(L)  CO2 22 - 32 mmol/L 31  Calcium 8.9 - 10.3 mg/dL 7.1(L)  Total Protein 6.5 - 8.1 g/dL 7.4  Total Bilirubin 0.3 - 1.2 mg/dL 0.3  Alkaline Phos 38 - 126 U/L 56  AST 15 - 41 U/L 16  ALT 0 - 44 U/L 12   CBC Latest Ref Rng & Units 05/16/2020  WBC 4.0 - 10.5 K/uL 7.4  Hemoglobin 12.0 - 15.0 g/dL 7.7(L)  Hematocrit 36.0 - 46.0 % 27.2(L)  Platelets 150 - 400 K/uL 243    Assessment and plan- Patient is a 70 y.o. female referred for anemia of chronic kidney disease  Patient's hemoglobin was normal at 12 back in July 2020 and since then it has gradually drifted down to 7.7 presently.  She may have anemia of chronic kidney disease but I would like to do a complete anemia work-up prior to that including CBC ferritin and iron studies, B12 and folate, myeloma panel, serum free light chains, LDH, reticulocyte count and haptoglobin.  I will see her back in 2 weeks to discuss the results of blood work and further management.  I would like to keep her ferritin closer to 200 before giving her EPO for anemia of chronic kidney  disease   Thank you for this kind referral and the opportunity to participate in the care of this patient   Visit Diagnosis 1. Normocytic anemia   2. Anemia of chronic kidney failure, stage 5 (HCC)     Dr. Randa Evens, MD, MPH [facility] [phone] 05/16/2020 3:43 PM              '

## 2020-05-16 NOTE — Progress Notes (Signed)
Patient here for initial visit. She has questions  about her treatment plan.

## 2020-05-17 LAB — MULTIPLE MYELOMA PANEL, SERUM
Albumin SerPl Elph-Mcnc: 2.9 g/dL (ref 2.9–4.4)
Albumin/Glob SerPl: 0.8 (ref 0.7–1.7)
Alpha 1: 0.3 g/dL (ref 0.0–0.4)
Alpha2 Glob SerPl Elph-Mcnc: 1.2 g/dL — ABNORMAL HIGH (ref 0.4–1.0)
B-Globulin SerPl Elph-Mcnc: 1 g/dL (ref 0.7–1.3)
Gamma Glob SerPl Elph-Mcnc: 1.4 g/dL (ref 0.4–1.8)
Globulin, Total: 3.9 g/dL (ref 2.2–3.9)
IgA: 176 mg/dL (ref 87–352)
IgG (Immunoglobin G), Serum: 1413 mg/dL (ref 586–1602)
IgM (Immunoglobulin M), Srm: 143 mg/dL (ref 26–217)
Total Protein ELP: 6.8 g/dL (ref 6.0–8.5)

## 2020-05-17 LAB — KAPPA/LAMBDA LIGHT CHAINS
Kappa free light chain: 468.4 mg/L — ABNORMAL HIGH (ref 3.3–19.4)
Kappa, lambda light chain ratio: 2.81 — ABNORMAL HIGH (ref 0.26–1.65)
Lambda free light chains: 166.9 mg/L — ABNORMAL HIGH (ref 5.7–26.3)

## 2020-05-17 LAB — HAPTOGLOBIN: Haptoglobin: 303 mg/dL (ref 37–355)

## 2020-05-24 ENCOUNTER — Other Ambulatory Visit: Payer: Self-pay | Admitting: Oncology

## 2020-05-24 DIAGNOSIS — D508 Other iron deficiency anemias: Secondary | ICD-10-CM

## 2020-05-24 DIAGNOSIS — D509 Iron deficiency anemia, unspecified: Secondary | ICD-10-CM | POA: Insufficient documentation

## 2020-05-30 ENCOUNTER — Inpatient Hospital Stay: Payer: Medicare HMO

## 2020-05-30 ENCOUNTER — Inpatient Hospital Stay: Payer: Medicare HMO | Admitting: Oncology

## 2020-06-02 ENCOUNTER — Inpatient Hospital Stay: Payer: Medicare HMO

## 2020-06-02 ENCOUNTER — Inpatient Hospital Stay (HOSPITAL_BASED_OUTPATIENT_CLINIC_OR_DEPARTMENT_OTHER): Payer: Medicare HMO | Admitting: Oncology

## 2020-06-02 ENCOUNTER — Encounter: Payer: Self-pay | Admitting: Oncology

## 2020-06-02 ENCOUNTER — Encounter (INDEPENDENT_AMBULATORY_CARE_PROVIDER_SITE_OTHER): Payer: Self-pay

## 2020-06-02 VITALS — BP 145/86 | HR 73 | Temp 97.1°F | Resp 18

## 2020-06-02 VITALS — BP 141/81 | HR 73 | Temp 96.1°F | Resp 16 | Ht 64.0 in | Wt 195.8 lb

## 2020-06-02 DIAGNOSIS — N185 Chronic kidney disease, stage 5: Secondary | ICD-10-CM | POA: Diagnosis not present

## 2020-06-02 DIAGNOSIS — F17210 Nicotine dependence, cigarettes, uncomplicated: Secondary | ICD-10-CM | POA: Diagnosis not present

## 2020-06-02 DIAGNOSIS — I120 Hypertensive chronic kidney disease with stage 5 chronic kidney disease or end stage renal disease: Secondary | ICD-10-CM | POA: Diagnosis not present

## 2020-06-02 DIAGNOSIS — D631 Anemia in chronic kidney disease: Secondary | ICD-10-CM | POA: Diagnosis not present

## 2020-06-02 DIAGNOSIS — D649 Anemia, unspecified: Secondary | ICD-10-CM | POA: Diagnosis not present

## 2020-06-02 DIAGNOSIS — D508 Other iron deficiency anemias: Secondary | ICD-10-CM

## 2020-06-02 DIAGNOSIS — Z79899 Other long term (current) drug therapy: Secondary | ICD-10-CM | POA: Diagnosis not present

## 2020-06-02 DIAGNOSIS — Z800 Family history of malignant neoplasm of digestive organs: Secondary | ICD-10-CM | POA: Diagnosis not present

## 2020-06-02 MED ORDER — IRON SUCROSE 20 MG/ML IV SOLN
200.0000 mg | Freq: Once | INTRAVENOUS | Status: AC
  Administered 2020-06-02: 200 mg via INTRAVENOUS
  Filled 2020-06-02: qty 10

## 2020-06-02 MED ORDER — SODIUM CHLORIDE 0.9 % IV SOLN
Freq: Once | INTRAVENOUS | Status: AC
  Filled 2020-06-02: qty 250

## 2020-06-02 MED ORDER — SODIUM CHLORIDE 0.9 % IV SOLN: 200.0000 mg | INTRAVENOUS | Status: DC

## 2020-06-02 NOTE — Progress Notes (Signed)
Pt states that she really does not want to get IV iron just if necessary because she does not like needles.she has fistula in left arm but not had to start dialysis. She eats and drinks good. bm good and from energy- she feels like she is doing ok. layed on shoulder on right and it was hurting before she went to sleep and woke up with pain but better this am

## 2020-06-02 NOTE — Progress Notes (Signed)
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  She has been referred to [place] for anemia.On review of her outside labs CBC showed H&H of 7.7/25.7 with an MCV of 85.4 on 04/20/2020.  Her hemoglobin has been gradually drifting down from 12 in July 2020 over the last 2 years.  White count and platelets have been normal.   Interval history-patient reports mild fatigue but otherwise doing well.  She is yet to start dialysis  ECOG PS- 1 Pain scale- 0   Review of systems- Review of Systems  Constitutional: Positive for malaise/fatigue. Negative for chills, fever and weight loss.  HENT: Negative for congestion, ear discharge and nosebleeds.   Eyes: Negative for blurred vision.  Respiratory: Negative for cough, hemoptysis, sputum production, shortness of breath and wheezing.   Cardiovascular: Negative for chest pain, palpitations, orthopnea and claudication.  Gastrointestinal: Negative for abdominal pain, blood in stool, constipation, diarrhea, heartburn, melena, nausea and vomiting.  Genitourinary: Negative for dysuria, flank pain, frequency, hematuria and urgency.  Musculoskeletal: Negative for back pain, joint pain and myalgias.  Skin: Negative for rash.  Neurological: Negative for dizziness, tingling, focal weakness, seizures, weakness and headaches.  Endo/Heme/Allergies: Does not bruise/bleed easily.   Psychiatric/Behavioral: Negative for depression and suicidal ideas. The patient does not have insomnia.       Allergies  Allergen Reactions  . Penicillins Hives and Rash    Has patient had a PCN reaction causing immediate rash, facial/tongue/throat swelling, SOB or lightheadedness with hypotension: Yes Has patient had a PCN reaction causing severe rash involving mucus membranes or skin necrosis: Yes--all over body Has patient had a PCN reaction that required hospitalization: No  Has patient had a PCN reaction occurring within the last 10 years: No If all of the above answers are "NO", then may proceed with Cephalosporin use.      Past Medical History:  Diagnosis Date  . Anemia   . Arthritis    osteoarthritis of right knee  . CHF (congestive heart failure) ([place])   . Chronic kidney disease    stage 3  . Gout   . Hepatitis C antibody test positive   . Hyperlipidemia   . Hypertension      Past Surgical History:  Procedure Laterality Date  . AV FISTULA PLACEMENT Left 11/27/2019   Procedure: ARTERIOVENOUS (AV) Brachiocephalic FISTULA CREATION, possible graft;  Surgeon: Katha Cabal, MD;  Location: [facility] ORS;  Service: Vascular;  Laterality: Left;  . CARDIAC CATHETERIZATION    . LAPAROSCOPIC LYSIS OF ADHESIONS  08/12/2019   Procedure: LAPAROSCOPIC LYSIS OF ADHESIONS;  Surgeon: Jules Husbands, MD;  Location: [facility] ORS;  Service: General;;  . PARTIAL KNEE ARTHROPLASTY Right 06/11/2017   Procedure: UNICOMPARTMENTAL KNEE;  Surgeon: Corky Mull, MD;  Location: [facility] ORS;  Service: Orthopedics;  Laterality: Right;  . REPLACEMENT TOTAL KNEE Left 2012    Social History   Socioeconomic History  . Marital status: Married    Spouse name: Not on file  .  Number of children: Not on file  . Years of education: Not on file  . Highest education level: Not on file  Occupational History  . Not on file  Tobacco Use  . Smoking status: Current Every Day Smoker    Packs/day: 0.50     Types: Cigarettes  . Smokeless tobacco: Never Used  Vaping Use  . Vaping Use: Never used  Substance and Sexual Activity  . Alcohol use: Never  . Drug use: Not Currently    Comment: 30 years ago  . Sexual activity: Not on file  Other Topics Concern  . Not on file  Social History Narrative  . Not on file   Social Determinants of Health   Financial Resource Strain: Not on file  Food Insecurity: Not on file  Transportation Needs: Not on file  Physical Activity: Not on file  Stress: Not on file  Social Connections: Not on file  Intimate Partner Violence: Not on file    Family History  Problem Relation Age of Onset  . Colon cancer Mother      Current Outpatient Medications:  .  allopurinol (ZYLOPRIM) 100 MG tablet, Take 100 mg by mouth every morning. , Disp: , Rfl:  .  amLODipine (NORVASC) 10 MG tablet, Take 10 mg by mouth every morning. , Disp: , Rfl:  .  atorvastatin (LIPITOR) 40 MG tablet, Take 40 mg by mouth every morning. , Disp: , Rfl:  .  calcitRIOL (ROCALTROL) 0.25 MCG capsule, Take 0.25 mcg by mouth daily., Disp: , Rfl:  .  Calcium 500-100 MG-UNIT CHEW, Chew 500 mg by mouth., Disp: , Rfl:  .  isosorbide mononitrate (IMDUR) 60 MG 24 hr tablet, Take 60 mg by mouth every morning. , Disp: , Rfl:  .  lisinopril (ZESTRIL) 20 MG tablet, Take 20 mg by mouth every morning. , Disp: , Rfl:  .  metoprolol (TOPROL-XL) 200 MG 24 hr tablet, Take 200 mg by mouth every morning. , Disp: , Rfl:  .  sodium bicarbonate 650 MG tablet, Take 2 tablets by mouth in the morning and at bedtime., Disp: , Rfl:  .  torsemide (DEMADEX) 20 MG tablet, Take 40 mg by mouth daily. , Disp: , Rfl:   Physical exam:  Vitals:   06/02/20 1100  BP: (!) 141/81  Pulse: 73  Resp: 16  Temp: (!) 96.1 F (35.6 C)  TempSrc: Tympanic  Weight: 195 lb 12.8 oz (88.8 kg)  Height: 5\' 4"  (1.626 m)   Physical Exam Constitutional:      General: She is not in acute distress. Cardiovascular:     Rate and Rhythm:  Normal rate and regular rhythm.     Heart sounds: Normal heart sounds.  Pulmonary:     Effort: Pulmonary effort is normal.     Breath sounds: Normal breath sounds.  Musculoskeletal:     Comments: AV fistula in her left arm  Skin:    General: Skin is warm and dry.  Neurological:     Mental Status: She is alert and oriented to person, place, and time.      CMP Latest Ref Rng & Units 05/16/2020  Glucose 70 - 99 mg/dL 106(H)  BUN 8 - 23 mg/dL 82(H)  Creatinine 0.44 - 1.00 mg/dL 6.72(H)  Sodium 135 - 145 mmol/L 139  Potassium 3.5 - 5.1 mmol/L 4.0  Chloride 98 - 111 mmol/L 94(L)  CO2 22 - 32 mmol/L 31  Calcium 8.9 - 10.3 mg/dL 7.1(L)  Total Protein 6.5 - 8.1  g/dL 7.4  Total Bilirubin 0.3 - 1.2 mg/dL 0.3  Alkaline Phos 38 - 126 U/L 56  AST 15 - 41 U/L 16  ALT 0 - 44 U/L 12   CBC Latest Ref Rng & Units 05/16/2020  WBC 4.0 - 10.5 K/uL 7.4  Hemoglobin 12.0 - 15.0 g/dL 7.7(L)  Hematocrit 36.0 - 46.0 % 27.2(L)  Platelets 150 - 400 K/uL 243    No images are attached to the encounter.  No results found.   Assessment and plan- Patient is a 70 y.o. female with normocytic anemia here to discuss results of blood work  Peripheral blood anemia work-up reveals evidence of iron deficiency with a ferritin level of 12 and low iron saturation of 9%.Other than that she does not have any other obvious reason for her anemia other than her chronic kidney disease.  She would benefit from IV iron given her significant anemia.  Her insurance requires [place] to give her 5 doses of Venofer.  She is hesitant to proceed with infusions but after some deliberation she is willing to give it a try today.  If she is willing to proceed with 5 doses then she will proceed with IV iron and I will see her back in 3 months with repeat CBC ferritin and iron studies.  Once she starts dialysis she can get both EPO and IV iron through dialysis.  I have also advised her to take oral iron 325 mg 2 or 3 times a day as tolerated.    Visit Diagnosis 1. Normocytic anemia      Dr. Randa Evens, MD, MPH [facility] [phone] 06/02/2020 3:54 PM

## 2020-06-07 ENCOUNTER — Inpatient Hospital Stay: Payer: Medicare HMO

## 2020-06-07 VITALS — BP 140/77 | HR 72 | Temp 97.0°F | Resp 18

## 2020-06-07 DIAGNOSIS — N185 Chronic kidney disease, stage 5: Secondary | ICD-10-CM | POA: Diagnosis not present

## 2020-06-07 DIAGNOSIS — I120 Hypertensive chronic kidney disease with stage 5 chronic kidney disease or end stage renal disease: Secondary | ICD-10-CM | POA: Diagnosis not present

## 2020-06-07 DIAGNOSIS — D508 Other iron deficiency anemias: Secondary | ICD-10-CM

## 2020-06-07 DIAGNOSIS — Z800 Family history of malignant neoplasm of digestive organs: Secondary | ICD-10-CM | POA: Diagnosis not present

## 2020-06-07 DIAGNOSIS — F17210 Nicotine dependence, cigarettes, uncomplicated: Secondary | ICD-10-CM | POA: Diagnosis not present

## 2020-06-07 DIAGNOSIS — D649 Anemia, unspecified: Secondary | ICD-10-CM | POA: Diagnosis not present

## 2020-06-07 DIAGNOSIS — Z79899 Other long term (current) drug therapy: Secondary | ICD-10-CM | POA: Diagnosis not present

## 2020-06-07 DIAGNOSIS — D631 Anemia in chronic kidney disease: Secondary | ICD-10-CM | POA: Diagnosis not present

## 2020-06-07 MED ORDER — SODIUM CHLORIDE 0.9 % IV SOLN: 200.0000 mg | INTRAVENOUS | Status: DC

## 2020-06-07 MED ORDER — IRON SUCROSE 20 MG/ML IV SOLN
200.0000 mg | Freq: Once | INTRAVENOUS | Status: AC
  Administered 2020-06-07: 200 mg via INTRAVENOUS
  Filled 2020-06-07: qty 10

## 2020-06-07 MED ORDER — SODIUM CHLORIDE 0.9 % IV SOLN
Freq: Once | INTRAVENOUS | Status: AC
Start: 2020-06-07 — End: 2020-06-07
  Filled 2020-06-07: qty 250

## 2020-06-07 NOTE — Patient Instructions (Signed)

## 2020-06-13 ENCOUNTER — Inpatient Hospital Stay: Payer: Medicare HMO | Attending: Oncology

## 2020-06-13 ENCOUNTER — Other Ambulatory Visit: Payer: Self-pay

## 2020-06-13 VITALS — BP 137/75 | HR 71 | Temp 97.6°F | Resp 18

## 2020-06-13 DIAGNOSIS — D631 Anemia in chronic kidney disease: Secondary | ICD-10-CM | POA: Diagnosis not present

## 2020-06-13 DIAGNOSIS — D508 Other iron deficiency anemias: Secondary | ICD-10-CM

## 2020-06-13 DIAGNOSIS — N185 Chronic kidney disease, stage 5: Secondary | ICD-10-CM | POA: Diagnosis not present

## 2020-06-13 MED ORDER — SODIUM CHLORIDE 0.9 % IV SOLN
Freq: Once | INTRAVENOUS | Status: AC
  Filled 2020-06-13: qty 250

## 2020-06-13 MED ORDER — IRON SUCROSE 20 MG/ML IV SOLN
200.0000 mg | Freq: Once | INTRAVENOUS | Status: AC
  Administered 2020-06-13: 200 mg via INTRAVENOUS
  Filled 2020-06-13: qty 10

## 2020-06-13 MED ORDER — SODIUM CHLORIDE 0.9 % IV SOLN: 200.0000 mg | INTRAVENOUS | Status: DC

## 2020-06-13 NOTE — Patient Instructions (Signed)
[facility] [place] [facility]    Discharge Instructions:  Thank you for choosing [facility] to provide your oncology and hematology care.  If you have a lab appointment with the Cancer Center, please go directly to the Cancer Center and check in at the registration area.  We strive to give you quality time with your provider. You may need to reschedule your appointment if you arrive late (15 or more minutes).  Arriving late affects you and other patients whose appointments are after yours.  Also, if you miss three or more appointments without notifying the office, you may be dismissed from the clinic at the provider's discretion.      For prescription refill requests, have your pharmacy contact our office and allow 72 hours for refills to be completed.    Today you received the following treatment: Venofer.  BELOW ARE SYMPTOMS THAT SHOULD BE REPORTED IMMEDIATELY: . *FEVER GREATER THAN 100.4 F (38 C) OR HIGHER . *CHILLS OR SWEATING . *NAUSEA AND VOMITING THAT IS NOT CONTROLLED WITH YOUR NAUSEA MEDICATION . *UNUSUAL SHORTNESS OF BREATH . *UNUSUAL BRUISING OR BLEEDING . *URINARY PROBLEMS (pain or burning when urinating, or frequent urination) . *BOWEL PROBLEMS (unusual diarrhea, constipation, pain near the anus) . TENDERNESS IN MOUTH AND THROAT WITH OR WITHOUT PRESENCE OF ULCERS (sore throat, sores in mouth, or a toothache) . UNUSUAL RASH, SWELLING OR PAIN  . UNUSUAL VAGINAL DISCHARGE OR ITCHING   Items with * indicate a potential emergency and should be followed up as soon as possible or go to the Emergency Department if any problems should occur.  Should you have questions after your visit or need to cancel or reschedule your appointment, please contact [facility] [place] [facility]  [phone] and follow the prompts.  Office hours are 8:00 a.m. to 4:30 p.m. Monday - Friday. Please note that voicemails left after 4:00 p.m. may not be  returned until the following business day.  We are closed weekends and major holidays. You have access to a nurse at all times for urgent questions. Please call the main number to the clinic [phone] and follow the prompts.  For any non-urgent questions, you may also contact your provider using MyChart. We now offer e-Visits for anyone 18 and older to request care online for non-urgent symptoms. For details visit [email or web address].   Also download the MyChart app! Go to the app store, search "MyChart", open the app, select [place], and log in with your MyChart username and password.  Due to Covid, a mask is required upon entering the hospital/clinic. If you do not have a mask, one will be given to you upon arrival. For doctor visits, patients may have 1 support person aged 18 or older with them. For treatment visits, patients cannot have anyone with them due to current Covid guidelines and our immunocompromised population.   Iron Sucrose injection  What is this medicine? IRON SUCROSE (AHY ern SOO krohs) is an iron complex. Iron is used to make healthy red blood cells, which carry oxygen and nutrients throughout the body. This medicine is used to treat iron deficiency anemia in people with chronic kidney disease. This medicine may be used for other purposes; ask your health care provider or pharmacist if you have questions. COMMON BRAND NAME(S): Venofer What should I tell my health care provider before I take this medicine? They need to know if you have any of these conditions:  anemia not caused by low iron levels    heart disease  high levels of iron in the blood  kidney disease  liver disease  an unusual or allergic reaction to iron, other medicines, foods, dyes, or preservatives  pregnant or trying to get pregnant  breast-feeding How should I use this medicine? This medicine is for infusion into a vein. It is given by a health care professional in a hospital or clinic  setting. Talk to your pediatrician regarding the use of this medicine in children. While this drug may be prescribed for children as young as 2 years for selected conditions, precautions do apply. Overdosage: If you think you have taken too much of this medicine contact a poison control center or emergency room at once. NOTE: This medicine is only for you. Do not share this medicine with others. What if I miss a dose? It is important not to miss your dose. Call your doctor or health care professional if you are unable to keep an appointment. What may interact with this medicine? Do not take this medicine with any of the following medications:  deferoxamine  dimercaprol  other iron products This medicine may also interact with the following medications:  chloramphenicol  deferasirox This list may not describe all possible interactions. Give your health care provider a list of all the medicines, herbs, non-prescription drugs, or dietary supplements you use. Also tell them if you smoke, drink alcohol, or use illegal drugs. Some items may interact with your medicine. What should I watch for while using this medicine? Visit your doctor or healthcare professional regularly. Tell your doctor or healthcare professional if your symptoms do not start to get better or if they get worse. You may need blood work done while you are taking this medicine. You may need to follow a special diet. Talk to your doctor. Foods that contain iron include: whole grains/cereals, dried fruits, beans, or peas, leafy green vegetables, and organ meats (liver, kidney). What side effects may I notice from receiving this medicine? Side effects that you should report to your doctor or health care professional as soon as possible:  allergic reactions like skin rash, itching or hives, swelling of the face, lips, or tongue  breathing problems  changes in blood pressure  cough  fast, irregular heartbeat  feeling faint  or lightheaded, falls  fever or chills  flushing, sweating, or hot feelings  joint or muscle aches/pains  seizures  swelling of the ankles or feet  unusually weak or tired Side effects that usually do not require medical attention (report to your doctor or health care professional if they continue or are bothersome):  diarrhea  feeling achy  headache  irritation at site where injected  nausea, vomiting  stomach upset  tiredness This list may not describe all possible side effects. Call your doctor for medical advice about side effects. You may report side effects to FDA at 1-800-FDA-1088. Where should I keep my medicine? This drug is given in a hospital or clinic and will not be stored at home. NOTE: This sheet is a summary. It may not cover all possible information. If you have questions about this medicine, talk to your doctor, pharmacist, or health care provider.  2021 Elsevier/Gold Standard (2010-11-09 17:14:35)  

## 2020-06-15 ENCOUNTER — Inpatient Hospital Stay: Payer: Medicare HMO

## 2020-06-15 VITALS — BP 118/76 | HR 76 | Resp 18

## 2020-06-15 DIAGNOSIS — D631 Anemia in chronic kidney disease: Secondary | ICD-10-CM | POA: Diagnosis not present

## 2020-06-15 DIAGNOSIS — N185 Chronic kidney disease, stage 5: Secondary | ICD-10-CM | POA: Diagnosis not present

## 2020-06-15 DIAGNOSIS — D508 Other iron deficiency anemias: Secondary | ICD-10-CM

## 2020-06-15 MED ORDER — IRON SUCROSE 20 MG/ML IV SOLN
200.0000 mg | Freq: Once | INTRAVENOUS | Status: AC
  Administered 2020-06-15: 200 mg via INTRAVENOUS
  Filled 2020-06-15: qty 10

## 2020-06-15 MED ORDER — SODIUM CHLORIDE 0.9 % IV SOLN: 200.0000 mg | INTRAVENOUS | Status: DC

## 2020-06-15 MED ORDER — SODIUM CHLORIDE 0.9 % IV SOLN
Freq: Once | INTRAVENOUS | Status: AC
  Filled 2020-06-15: qty 250

## 2020-06-15 NOTE — Patient Instructions (Signed)
[facility] [place] [facility]  Discharge Instructions: Thank you for choosing [facility] to provide your oncology and hematology care.  If you have a lab appointment with the Cancer Center, please go directly to the Cancer Center and check in at the registration area.  Wear comfortable clothing and clothing appropriate for easy access to any Portacath or PICC line.   We strive to give you quality time with your provider. You may need to reschedule your appointment if you arrive late (15 or more minutes).  Arriving late affects you and other patients whose appointments are after yours.  Also, if you miss three or more appointments without notifying the office, you may be dismissed from the clinic at the provider's discretion.      For prescription refill requests, have your pharmacy contact our office and allow 72 hours for refills to be completed.    Today you received the following : Venofer   To help prevent nausea and vomiting after your treatment, we encourage you to take your nausea medication as directed.  BELOW ARE SYMPTOMS THAT SHOULD BE REPORTED IMMEDIATELY: . *FEVER GREATER THAN 100.4 F (38 C) OR HIGHER . *CHILLS OR SWEATING . *NAUSEA AND VOMITING THAT IS NOT CONTROLLED WITH YOUR NAUSEA MEDICATION . *UNUSUAL SHORTNESS OF BREATH . *UNUSUAL BRUISING OR BLEEDING . *URINARY PROBLEMS (pain or burning when urinating, or frequent urination) . *BOWEL PROBLEMS (unusual diarrhea, constipation, pain near the anus) . TENDERNESS IN MOUTH AND THROAT WITH OR WITHOUT PRESENCE OF ULCERS (sore throat, sores in mouth, or a toothache) . UNUSUAL RASH, SWELLING OR PAIN  . UNUSUAL VAGINAL DISCHARGE OR ITCHING   Items with * indicate a potential emergency and should be followed up as soon as possible or go to the Emergency Department if any problems should occur.  Please show the CHEMOTHERAPY ALERT CARD or IMMUNOTHERAPY ALERT CARD at check-in to the Emergency  Department and triage nurse.  Should you have questions after your visit or need to cancel or reschedule your appointment, please contact [facility] [place] [facility]  [phone] and follow the prompts.  Office hours are 8:00 a.m. to 4:30 p.m. Monday - Friday. Please note that voicemails left after 4:00 p.m. may not be returned until the following business day.  We are closed weekends and major holidays. You have access to a nurse at all times for urgent questions. Please call the main number to the clinic [phone] and follow the prompts.  For any non-urgent questions, you may also contact your provider using MyChart. We now offer e-Visits for anyone 18 and older to request care online for non-urgent symptoms. For details visit [email or web address].   Also download the MyChart app! Go to the app store, search "MyChart", open the app, select [address], and log in with your MyChart username and password.  Due to Covid, a mask is required upon entering the hospital/clinic. If you do not have a mask, one will be given to you upon arrival. For doctor visits, patients may have 1 support person aged 18 or older with them. For treatment visits, patients cannot have anyone with them due to current Covid guidelines and our immunocompromised population.  

## 2020-06-17 ENCOUNTER — Inpatient Hospital Stay: Payer: Medicare HMO

## 2020-06-17 VITALS — BP 121/74 | HR 68 | Temp 97.5°F | Resp 16

## 2020-06-17 DIAGNOSIS — D508 Other iron deficiency anemias: Secondary | ICD-10-CM

## 2020-06-17 DIAGNOSIS — D631 Anemia in chronic kidney disease: Secondary | ICD-10-CM | POA: Diagnosis not present

## 2020-06-17 DIAGNOSIS — N185 Chronic kidney disease, stage 5: Secondary | ICD-10-CM | POA: Diagnosis not present

## 2020-06-17 MED ORDER — IRON SUCROSE 20 MG/ML IV SOLN
200.0000 mg | Freq: Once | INTRAVENOUS | Status: AC
Start: 2020-06-17 — End: 2020-06-17
  Administered 2020-06-17: 200 mg via INTRAVENOUS
  Filled 2020-06-17: qty 10

## 2020-06-17 MED ORDER — SODIUM CHLORIDE 0.9 % IV SOLN: 200.0000 mg | INTRAVENOUS | Status: DC

## 2020-06-17 MED ORDER — SODIUM CHLORIDE 0.9 % IV SOLN
Freq: Once | INTRAVENOUS | Status: AC
Start: 2020-06-17 — End: 2020-06-17
  Filled 2020-06-17: qty 250

## 2020-06-17 NOTE — Patient Instructions (Signed)
CANCER CENTER [place] [facility]  Discharge Instructions: Thank you for choosing [facility] to provide your oncology and hematology care.  If you have a lab appointment with the Cancer Center, please go directly to the Cancer Center and check in at the registration area.  Wear comfortable clothing and clothing appropriate for easy access to any Portacath or PICC line.   We strive to give you quality time with your provider. You may need to reschedule your appointment if you arrive late (15 or more minutes).  Arriving late affects you and other patients whose appointments are after yours.  Also, if you miss three or more appointments without notifying the office, you may be dismissed from the clinic at the provider's discretion.      For prescription refill requests, have your pharmacy contact our office and allow 72 hours for refills to be completed.    Today you received the following venofer   To help prevent nausea and vomiting after your treatment, we encourage you to take your nausea medication as directed.  BELOW ARE SYMPTOMS THAT SHOULD BE REPORTED IMMEDIATELY: . *FEVER GREATER THAN 100.4 F (38 C) OR HIGHER . *CHILLS OR SWEATING . *NAUSEA AND VOMITING THAT IS NOT CONTROLLED WITH YOUR NAUSEA MEDICATION . *UNUSUAL SHORTNESS OF BREATH . *UNUSUAL BRUISING OR BLEEDING . *URINARY PROBLEMS (pain or burning when urinating, or frequent urination) . *BOWEL PROBLEMS (unusual diarrhea, constipation, pain near the anus) . TENDERNESS IN MOUTH AND THROAT WITH OR WITHOUT PRESENCE OF ULCERS (sore throat, sores in mouth, or a toothache) . UNUSUAL RASH, SWELLING OR PAIN  . UNUSUAL VAGINAL DISCHARGE OR ITCHING   Items with * indicate a potential emergency and should be followed up as soon as possible or go to the Emergency Department if any problems should occur.  Please show the CHEMOTHERAPY ALERT CARD or IMMUNOTHERAPY ALERT CARD at check-in to the Emergency  Department and triage nurse.  Should you have questions after your visit or need to cancel or reschedule your appointment, please contact [facility] C[facility] [place] [facility]  [phone] and follow the prompts.  Office hours are 8:00 a.m. to 4:30 p.m. Monday - Friday. Please note that voicemails left after 4:00 p.m. may not be returned until the following business day.  We are closed weekends and major holidays. You have access to a nurse at all times for urgent questions. Please call the main number to the clinic [phone] and follow the prompts.  For any non-urgent questions, you may also contact your provider using MyChart. We now offer e-Visits for anyone 18 and older to request care online for non-urgent symptoms. For details visit [email or web address].   Also download the MyChart app! Go to the app store, search "MyChart", open the app, select [facility], and log in with your MyChart username and password.  Due to Covid, a mask is required upon entering the hospital/clinic. If you do not have a mask, one will be given to you upon arrival. For doctor visits, patients may have 1 support person aged 18 or older with them. For treatment visits, patients cannot have anyone with them due to current Covid guidelines and our immunocompromised population.   Iron Sucrose injection What is this medicine? IRON SUCROSE (AHY ern SOO krohs) is an iron complex. Iron is used to make healthy red blood cells, which carry oxygen and nutrients throughout the body. This medicine is used to treat iron deficiency anemia in people with chronic kidney disease. This medicine may   be used for other purposes; ask your health care provider or pharmacist if you have questions. COMMON BRAND NAME(S): Venofer What should I tell my health care provider before I take this medicine? They need to know if you have any of these conditions:  anemia not caused by low iron levels  heart disease  high levels of  iron in the blood  kidney disease  liver disease  an unusual or allergic reaction to iron, other medicines, foods, dyes, or preservatives  pregnant or trying to get pregnant  breast-feeding How should I use this medicine? This medicine is for infusion into a vein. It is given by a health care professional in a hospital or clinic setting. Talk to your pediatrician regarding the use of this medicine in children. While this drug may be prescribed for children as young as 2 years for selected conditions, precautions do apply. Overdosage: If you think you have taken too much of this medicine contact a poison control center or emergency room at once. NOTE: This medicine is only for you. Do not share this medicine with others. What if I miss a dose? It is important not to miss your dose. Call your doctor or health care professional if you are unable to keep an appointment. What may interact with this medicine? Do not take this medicine with any of the following medications:  deferoxamine  dimercaprol  other iron products This medicine may also interact with the following medications:  chloramphenicol  deferasirox This list may not describe all possible interactions. Give your health care provider a list of all the medicines, herbs, non-prescription drugs, or dietary supplements you use. Also tell them if you smoke, drink alcohol, or use illegal drugs. Some items may interact with your medicine. What should I watch for while using this medicine? Visit your doctor or healthcare professional regularly. Tell your doctor or healthcare professional if your symptoms do not start to get better or if they get worse. You may need blood work done while you are taking this medicine. You may need to follow a special diet. Talk to your doctor. Foods that contain iron include: whole grains/cereals, dried fruits, beans, or peas, leafy green vegetables, and organ meats (liver, kidney). What side effects  may I notice from receiving this medicine? Side effects that you should report to your doctor or health care professional as soon as possible:  allergic reactions like skin rash, itching or hives, swelling of the face, lips, or tongue  breathing problems  changes in blood pressure  cough  fast, irregular heartbeat  feeling faint or lightheaded, falls  fever or chills  flushing, sweating, or hot feelings  joint or muscle aches/pains  seizures  swelling of the ankles or feet  unusually weak or tired Side effects that usually do not require medical attention (report to your doctor or health care professional if they continue or are bothersome):  diarrhea  feeling achy  headache  irritation at site where injected  nausea, vomiting  stomach upset  tiredness This list may not describe all possible side effects. Call your doctor for medical advice about side effects. You may report side effects to FDA at 1-800-FDA-1088. Where should I keep my medicine? This drug is given in a hospital or clinic and will not be stored at home. NOTE: This sheet is a summary. It may not cover all possible information. If you have questions about this medicine, talk to your doctor, pharmacist, or health care provider.  2021 Elsevier/Gold Standard (  2010-11-09 17:14:35)  

## 2020-06-20 ENCOUNTER — Inpatient Hospital Stay: Payer: Medicare HMO

## 2020-06-22 DIAGNOSIS — I10 Essential (primary) hypertension: Secondary | ICD-10-CM | POA: Diagnosis not present

## 2020-06-22 DIAGNOSIS — R801 Persistent proteinuria, unspecified: Secondary | ICD-10-CM | POA: Diagnosis not present

## 2020-06-22 DIAGNOSIS — N185 Chronic kidney disease, stage 5: Secondary | ICD-10-CM | POA: Diagnosis not present

## 2020-06-22 DIAGNOSIS — E872 Acidosis: Secondary | ICD-10-CM | POA: Diagnosis not present

## 2020-06-22 DIAGNOSIS — R600 Localized edema: Secondary | ICD-10-CM | POA: Diagnosis not present

## 2020-06-22 DIAGNOSIS — N2581 Secondary hyperparathyroidism of renal origin: Secondary | ICD-10-CM | POA: Diagnosis not present

## 2020-06-29 DIAGNOSIS — I25119 Atherosclerotic heart disease of native coronary artery with unspecified angina pectoris: Secondary | ICD-10-CM | POA: Diagnosis not present

## 2020-06-29 DIAGNOSIS — Z1211 Encounter for screening for malignant neoplasm of colon: Secondary | ICD-10-CM | POA: Diagnosis not present

## 2020-06-29 DIAGNOSIS — Z0000 Encounter for general adult medical examination without abnormal findings: Secondary | ICD-10-CM | POA: Diagnosis not present

## 2020-06-29 DIAGNOSIS — N185 Chronic kidney disease, stage 5: Secondary | ICD-10-CM | POA: Diagnosis not present

## 2020-06-29 DIAGNOSIS — I1311 Hypertensive heart and chronic kidney disease without heart failure, with stage 5 chronic kidney disease, or end stage renal disease: Secondary | ICD-10-CM | POA: Diagnosis not present

## 2020-07-26 DIAGNOSIS — E559 Vitamin D deficiency, unspecified: Secondary | ICD-10-CM | POA: Diagnosis not present

## 2020-07-26 DIAGNOSIS — N185 Chronic kidney disease, stage 5: Secondary | ICD-10-CM | POA: Diagnosis not present

## 2020-07-26 DIAGNOSIS — E213 Hyperparathyroidism, unspecified: Secondary | ICD-10-CM | POA: Diagnosis not present

## 2020-08-02 ENCOUNTER — Inpatient Hospital Stay: Payer: Medicare HMO

## 2020-08-02 ENCOUNTER — Inpatient Hospital Stay: Payer: Medicare HMO | Admitting: Oncology

## 2020-08-25 ENCOUNTER — Ambulatory Visit (INDEPENDENT_AMBULATORY_CARE_PROVIDER_SITE_OTHER): Payer: Medicare HMO | Admitting: Vascular Surgery

## 2020-08-25 ENCOUNTER — Encounter (INDEPENDENT_AMBULATORY_CARE_PROVIDER_SITE_OTHER): Payer: Self-pay | Admitting: Vascular Surgery

## 2020-08-25 ENCOUNTER — Encounter (INDEPENDENT_AMBULATORY_CARE_PROVIDER_SITE_OTHER): Payer: Medicare HMO

## 2020-08-26 ENCOUNTER — Inpatient Hospital Stay: Payer: Medicare HMO | Attending: Oncology

## 2020-08-26 ENCOUNTER — Inpatient Hospital Stay (HOSPITAL_BASED_OUTPATIENT_CLINIC_OR_DEPARTMENT_OTHER): Payer: Medicare HMO | Admitting: Oncology

## 2020-08-26 ENCOUNTER — Encounter: Payer: Self-pay | Admitting: Oncology

## 2020-08-26 VITALS — BP 147/84 | HR 78 | Temp 97.2°F | Resp 19 | Wt 198.6 lb

## 2020-08-26 DIAGNOSIS — N184 Chronic kidney disease, stage 4 (severe): Secondary | ICD-10-CM | POA: Diagnosis not present

## 2020-08-26 DIAGNOSIS — D509 Iron deficiency anemia, unspecified: Secondary | ICD-10-CM

## 2020-08-26 DIAGNOSIS — D649 Anemia, unspecified: Secondary | ICD-10-CM

## 2020-08-26 DIAGNOSIS — D631 Anemia in chronic kidney disease: Secondary | ICD-10-CM | POA: Diagnosis not present

## 2020-08-26 LAB — CBC WITH DIFFERENTIAL/PLATELET
Abs Immature Granulocytes: 0.06 10*3/uL (ref 0.00–0.07)
Basophils Absolute: 0 10*3/uL (ref 0.0–0.1)
Basophils Relative: 0 %
Eosinophils Absolute: 0.1 10*3/uL (ref 0.0–0.5)
Eosinophils Relative: 1 %
HCT: 29.6 % — ABNORMAL LOW (ref 36.0–46.0)
Hemoglobin: 8.9 g/dL — ABNORMAL LOW (ref 12.0–15.0)
Immature Granulocytes: 1 %
Lymphocytes Relative: 11 %
Lymphs Abs: 1 10*3/uL (ref 0.7–4.0)
MCH: 26.6 pg (ref 26.0–34.0)
MCHC: 30.1 g/dL (ref 30.0–36.0)
MCV: 88.6 fL (ref 80.0–100.0)
Monocytes Absolute: 0.5 10*3/uL (ref 0.1–1.0)
Monocytes Relative: 6 %
Neutro Abs: 7.3 10*3/uL (ref 1.7–7.7)
Neutrophils Relative %: 81 %
Platelets: 162 10*3/uL (ref 150–400)
RBC: 3.34 MIL/uL — ABNORMAL LOW (ref 3.87–5.11)
RDW: 24.3 % — ABNORMAL HIGH (ref 11.5–15.5)
WBC: 9 10*3/uL (ref 4.0–10.5)
nRBC: 0.4 % — ABNORMAL HIGH (ref 0.0–0.2)

## 2020-08-26 LAB — IRON AND TIBC
Iron: 39 ug/dL (ref 28–170)
Saturation Ratios: 14 % (ref 10.4–31.8)
TIBC: 286 ug/dL (ref 250–450)
UIBC: 247 ug/dL

## 2020-08-26 LAB — FERRITIN: Ferritin: 46 ng/mL (ref 11–307)

## 2020-08-26 NOTE — Progress Notes (Signed)
IDA follow up. Complains of fatigue. No visible blood in stool recently. Reports she has an appt for GI doctor but hasnt gone yet.

## 2020-08-27 ENCOUNTER — Encounter: Payer: Self-pay | Admitting: Oncology

## 2020-08-27 NOTE — Progress Notes (Signed)
Hematology/Oncology Consult note [facility]  Telephone:([phone] Fax:([phone]) [phone]  Patient Care Team: Kirk Ruths, MD as PCP - General (Internal Medicine)   Name of the patient: Anna Key  [id number]  1950-02-15   Date of visit: 08/27/20  Diagnosis- iron deficiency anemia along with anemia of chronic kidney disease    Chief complaint/ Reason for visit-routine follow-up of iron deficiency anemia.  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  She has been referred to [place] for anemia.On review of her outside labs CBC showed H&H of 7.7/25.7 with an MCV of 85.4 on 04/20/2020.  Her hemoglobin has been gradually drifting down from 12 in July 2020 over the last 2 years.  White count and platelets have been normal.  Interval history-patient reports improvement in her energy levels after receiving IV iron.  She has some ongoing fatigue still but overall improved  ECOG PS- 1 Pain scale- 0  Review of systems- Review of Systems  Constitutional:  Positive for malaise/fatigue. Negative for chills, fever and weight loss.  HENT:  Negative for congestion, ear discharge and nosebleeds.   Eyes:  Negative for blurred vision.  Respiratory:  Negative for cough, hemoptysis, sputum production, shortness of breath and wheezing.   Cardiovascular:  Negative for chest pain, palpitations, orthopnea and claudication.  Gastrointestinal:  Negative for abdominal pain, blood in stool, constipation, diarrhea, heartburn, melena, nausea and vomiting.  Genitourinary:  Negative for dysuria, flank pain, frequency, hematuria and urgency.  Musculoskeletal:  Negative for back pain, joint pain and myalgias.  Skin:  Negative for rash.  Neurological:   Negative for dizziness, tingling, focal weakness, seizures, weakness and headaches.  Endo/Heme/Allergies:  Does not bruise/bleed easily.  Psychiatric/Behavioral:  Negative for depression and suicidal ideas. The patient does not have insomnia.      Allergies  Allergen Reactions   Penicillins Hives and Rash    Has patient had a PCN reaction causing immediate rash, facial/tongue/throat swelling, SOB or lightheadedness with hypotension: Yes Has patient had a PCN reaction causing severe rash involving mucus membranes or skin necrosis: Yes--all over body Has patient had a PCN reaction that required hospitalization: No  Has patient had a PCN reaction occurring within the last 10 years: No If all of the above answers are "NO", then may proceed with Cephalosporin use.      Past Medical History:  Diagnosis Date   Anemia    Arthritis    osteoarthritis of right knee   CHF (congestive heart failure) (HCC)    Chronic kidney disease    stage 3   Gout    Hepatitis C antibody test positive    Hyperlipidemia    Hypertension      Past Surgical History:  Procedure Laterality Date   AV FISTULA PLACEMENT Left 11/27/2019   Procedure: ARTERIOVENOUS (AV) Brachiocephalic FISTULA CREATION, possible graft;  Surgeon: Katha Cabal, MD;  Location: [facility] ORS;  Service: Vascular;  Laterality: Left;   CARDIAC CATHETERIZATION     LAPAROSCOPIC LYSIS OF ADHESIONS  08/12/2019   Procedure: LAPAROSCOPIC LYSIS OF ADHESIONS;  Surgeon: Jules Husbands, MD;  Location: [facility] ORS;  Service: General;;   PARTIAL KNEE ARTHROPLASTY Right 06/11/2017   Procedure: UNICOMPARTMENTAL KNEE;  Surgeon: Corky Mull, MD;  Location: [facility] ORS;  Service:  Orthopedics;  Laterality: Right;   REPLACEMENT TOTAL KNEE Left 2012    Social History   Socioeconomic History   Marital status: Married    Spouse name: Not on file   Number of children: Not on file   Years of education: Not on file   Highest education level: Not on file   Occupational History   Not on file  Tobacco Use   Smoking status: Every Day    Packs/day: 0.50    Types: Cigarettes   Smokeless tobacco: Never  Vaping Use   Vaping Use: Never used  Substance and Sexual Activity   Alcohol use: Never   Drug use: Not Currently    Comment: 30 years ago   Sexual activity: Not on file  Other Topics Concern   Not on file  Social History Narrative   Not on file   Social Determinants of Health   Financial Resource Strain: Not on file  Food Insecurity: Not on file  Transportation Needs: Not on file  Physical Activity: Not on file  Stress: Not on file  Social Connections: Not on file  Intimate Partner Violence: Not on file    Family History  Problem Relation Age of Onset   Colon cancer Mother      Current Outpatient Medications:    allopurinol (ZYLOPRIM) 100 MG tablet, Take 100 mg by mouth every morning. , Disp: , Rfl:    amLODipine (NORVASC) 10 MG tablet, Take 10 mg by mouth every morning. , Disp: , Rfl:    atorvastatin (LIPITOR) 40 MG tablet, Take 40 mg by mouth every morning. , Disp: , Rfl:    calcitRIOL (ROCALTROL) 0.25 MCG capsule, Take 0.25 mcg by mouth daily., Disp: , Rfl:    Calcium 500-100 MG-UNIT CHEW, Chew 500 mg by mouth., Disp: , Rfl:    isosorbide mononitrate (IMDUR) 60 MG 24 hr tablet, Take 60 mg by mouth every morning. , Disp: , Rfl:    lisinopril (ZESTRIL) 20 MG tablet, Take 20 mg by mouth every morning. , Disp: , Rfl:    metoprolol (TOPROL-XL) 200 MG 24 hr tablet, Take 200 mg by mouth every morning. , Disp: , Rfl:    sodium bicarbonate 650 MG tablet, Take 2 tablets by mouth in the morning and at bedtime., Disp: , Rfl:    torsemide (DEMADEX) 20 MG tablet, Take 40 mg by mouth daily. , Disp: , Rfl:   Physical exam:  Vitals:   08/26/20 1407  BP: (!) 147/84  Pulse: 78  Resp: 19  Temp: (!) 97.2 F (36.2 C)  TempSrc: Tympanic  SpO2: 93%  Weight: 198 lb 9.6 oz (90.1 kg)   Physical Exam Constitutional:      General: She  is not in acute distress. Cardiovascular:     Rate and Rhythm: Normal rate and regular rhythm.     Heart sounds: Normal heart sounds.  Pulmonary:     Effort: Pulmonary effort is normal.     Breath sounds: Normal breath sounds.  Abdominal:     General: Bowel sounds are normal.     Palpations: Abdomen is soft.  Skin:    General: Skin is warm and dry.  Neurological:     Mental Status: She is alert and oriented to person, place, and time.     CMP Latest Ref Rng & Units 05/16/2020  Glucose 70 - 99 mg/dL 106(H)  BUN 8 - 23 mg/dL 82(H)  Creatinine 0.44 - 1.00 mg/dL 6.72(H)  Sodium 135 - 145 mmol/L 139  Potassium 3.5 - 5.1 mmol/L 4.0  Chloride 98 - 111 mmol/L 94(L)  CO2 22 - 32 mmol/L 31  Calcium 8.9 - 10.3 mg/dL 7.1(L)  Total Protein 6.5 - 8.1 g/dL 7.4  Total Bilirubin 0.3 - 1.2 mg/dL 0.3  Alkaline Phos 38 - 126 U/L 56  AST 15 - 41 U/L 16  ALT 0 - 44 U/L 12   CBC Latest Ref Rng & Units 08/26/2020  WBC 4.0 - 10.5 K/uL 9.0  Hemoglobin 12.0 - 15.0 g/dL 8.9(L)  Hematocrit 36.0 - 46.0 % 29.6(L)  Platelets 150 - 400 K/uL 162   Assessment and plan- Patient is a 70 y.o. female who is here for follow-up of anemia which is a combination of iron deficiency and anemia of chronic kidney disease  Patient's hemoglobin is improved from 7.7-8.9 after receiving 5 doses of Venofer but has not yet increased significantly.  Her ferritin levels are 46 with an iron saturation of 14%.  Given her chronic kidney disease it would be reasonable to keep a target of ferritin 100 and iron saturation of 20% before considering Retacrit.  I will therefore bring her for 5 more doses of IV iron.  CBC ferritin and iron studies in 3 months and I will see her thereafter.  Patient did receive 5 doses of Venofer and seems to have tolerated it well without any significant side effects Visit Diagnosis 1. Iron deficiency anemia, unspecified iron deficiency anemia type   2. Anemia of chronic kidney failure, stage 4 (severe)  (HCC)      Dr. Randa Evens, MD, MPH [facility] at [facility] [phone] 08/27/2020 9:16 PM

## 2020-09-01 ENCOUNTER — Inpatient Hospital Stay: Payer: Medicare HMO

## 2020-09-01 VITALS — BP 143/81 | HR 73 | Temp 97.0°F | Resp 17

## 2020-09-01 DIAGNOSIS — D509 Iron deficiency anemia, unspecified: Secondary | ICD-10-CM | POA: Diagnosis not present

## 2020-09-01 DIAGNOSIS — D508 Other iron deficiency anemias: Secondary | ICD-10-CM

## 2020-09-01 DIAGNOSIS — N184 Chronic kidney disease, stage 4 (severe): Secondary | ICD-10-CM | POA: Diagnosis not present

## 2020-09-01 DIAGNOSIS — D631 Anemia in chronic kidney disease: Secondary | ICD-10-CM | POA: Diagnosis not present

## 2020-09-01 MED ORDER — SODIUM CHLORIDE 0.9 % IV SOLN
Freq: Once | INTRAVENOUS | Status: AC
Start: 2020-09-01 — End: 2020-09-01
  Filled 2020-09-01: qty 250

## 2020-09-01 MED ORDER — SODIUM CHLORIDE 0.9 % IV SOLN: 200.0000 mg | INTRAVENOUS | Status: DC

## 2020-09-01 MED ORDER — IRON SUCROSE 20 MG/ML IV SOLN
200.0000 mg | Freq: Once | INTRAVENOUS | Status: AC
  Administered 2020-09-01: 200 mg via INTRAVENOUS
  Filled 2020-09-01: qty 10

## 2020-09-01 NOTE — Patient Instructions (Signed)

## 2020-09-05 ENCOUNTER — Inpatient Hospital Stay: Payer: Medicare HMO

## 2020-09-05 ENCOUNTER — Other Ambulatory Visit: Payer: Self-pay

## 2020-09-05 VITALS — BP 133/63 | HR 74 | Resp 16

## 2020-09-05 DIAGNOSIS — D509 Iron deficiency anemia, unspecified: Secondary | ICD-10-CM | POA: Diagnosis not present

## 2020-09-05 DIAGNOSIS — D508 Other iron deficiency anemias: Secondary | ICD-10-CM

## 2020-09-05 DIAGNOSIS — D631 Anemia in chronic kidney disease: Secondary | ICD-10-CM | POA: Diagnosis not present

## 2020-09-05 DIAGNOSIS — N184 Chronic kidney disease, stage 4 (severe): Secondary | ICD-10-CM | POA: Diagnosis not present

## 2020-09-05 MED ORDER — IRON SUCROSE 20 MG/ML IV SOLN
200.0000 mg | Freq: Once | INTRAVENOUS | Status: AC
  Administered 2020-09-05: 200 mg via INTRAVENOUS
  Filled 2020-09-05: qty 10

## 2020-09-05 MED ORDER — SODIUM CHLORIDE 0.9 % IV SOLN: 200.0000 mg | INTRAVENOUS | Status: DC

## 2020-09-05 MED ORDER — SODIUM CHLORIDE 0.9 % IV SOLN
Freq: Once | INTRAVENOUS | Status: AC
  Filled 2020-09-05: qty 250

## 2020-09-05 NOTE — Patient Instructions (Signed)
CANCER CENTER [place] [facility]  Discharge Instructions: Thank you for choosing [facility] to provide your oncology and hematology care.  If you have a lab appointment with the Cancer Center, please go directly to the Cancer Center and check in at the registration area.  Wear comfortable clothing and clothing appropriate for easy access to any Portacath or PICC line.   We strive to give you quality time with your provider. You may need to reschedule your appointment if you arrive late (15 or more minutes).  Arriving late affects you and other patients whose appointments are after yours.  Also, if you miss three or more appointments without notifying the office, you may be dismissed from the clinic at the provider's discretion.      For prescription refill requests, have your pharmacy contact our office and allow 72 hours for refills to be completed.    Today you received the following : Venofer   To help prevent nausea and vomiting after your treatment, we encourage you to take your nausea medication as directed.  BELOW ARE SYMPTOMS THAT SHOULD BE REPORTED IMMEDIATELY: . *FEVER GREATER THAN 100.4 F (38 C) OR HIGHER . *CHILLS OR SWEATING . *NAUSEA AND VOMITING THAT IS NOT CONTROLLED WITH YOUR NAUSEA MEDICATION . *UNUSUAL SHORTNESS OF BREATH . *UNUSUAL BRUISING OR BLEEDING . *URINARY PROBLEMS (pain or burning when urinating, or frequent urination) . *BOWEL PROBLEMS (unusual diarrhea, constipation, pain near the anus) . TENDERNESS IN MOUTH AND THROAT WITH OR WITHOUT PRESENCE OF ULCERS (sore throat, sores in mouth, or a toothache) . UNUSUAL RASH, SWELLING OR PAIN  . UNUSUAL VAGINAL DISCHARGE OR ITCHING   Items with * indicate a potential emergency and should be followed up as soon as possible or go to the Emergency Department if any problems should occur.  Please show the CHEMOTHERAPY ALERT CARD or IMMUNOTHERAPY ALERT CARD at check-in to the Emergency  Department and triage nurse.  Should you have questions after your visit or need to cancel or reschedule your appointment, please contact [facility]ENTER [place] [facility]  [phone] and follow the prompts.  Office hours are 8:00 a.m. to 4:30 p.m. Monday - Friday. Please note that voicemails left after 4:00 p.m. may not be returned until the following business day.  We are closed weekends and major holidays. You have access to a nurse at all times for urgent questions. Please call the main number to the clinic [phone] and follow the prompts.  For any non-urgent questions, you may also contact your provider using MyChart. We now offer e-Visits for anyone 18 and older to request care online for non-urgent symptoms. For details visit [email or web address].   Also download the MyChart app! Go to the app store, search "MyChart", open the app, select [facility], and log in with your MyChart username and password.  Due to Covid, a mask is required upon entering the hospital/clinic. If you do not have a mask, one will be given to you upon arrival. For doctor visits, patients may have 1 support person aged 18 or older with them. For treatment visits, patients cannot have anyone with them due to current Covid guidelines and our immunocompromised population.  

## 2020-09-07 ENCOUNTER — Inpatient Hospital Stay: Payer: Medicare HMO

## 2020-09-07 VITALS — BP 153/81 | HR 75 | Temp 96.3°F | Resp 18

## 2020-09-07 DIAGNOSIS — N184 Chronic kidney disease, stage 4 (severe): Secondary | ICD-10-CM | POA: Diagnosis not present

## 2020-09-07 DIAGNOSIS — D509 Iron deficiency anemia, unspecified: Secondary | ICD-10-CM | POA: Diagnosis not present

## 2020-09-07 DIAGNOSIS — D508 Other iron deficiency anemias: Secondary | ICD-10-CM

## 2020-09-07 DIAGNOSIS — Z800 Family history of malignant neoplasm of digestive organs: Secondary | ICD-10-CM | POA: Diagnosis not present

## 2020-09-07 DIAGNOSIS — D631 Anemia in chronic kidney disease: Secondary | ICD-10-CM | POA: Diagnosis not present

## 2020-09-07 MED ORDER — SODIUM CHLORIDE 0.9 % IV SOLN: 200.0000 mg | INTRAVENOUS | Status: DC

## 2020-09-07 MED ORDER — SODIUM CHLORIDE 0.9 % IV SOLN
Freq: Once | INTRAVENOUS | Status: AC
  Filled 2020-09-07: qty 250

## 2020-09-07 MED ORDER — IRON SUCROSE 20 MG/ML IV SOLN
200.0000 mg | Freq: Once | INTRAVENOUS | Status: AC
  Administered 2020-09-07: 200 mg via INTRAVENOUS
  Filled 2020-09-07: qty 10

## 2020-09-07 NOTE — Patient Instructions (Signed)
C[facility]ER [facility] [place] [facility]   Discharge Instructions: Thank you for choosing [facility] to provide your oncology and hematology care.  If you have a lab appointment with the Cancer Center, please go directly to the Cancer Center and check in at the registration area.  We strive to give you quality time with your provider. You may need to reschedule your appointment if you arrive late (15 or more minutes).  Arriving late affects you and other patients whose appointments are after yours.  Also, if you miss three or more appointments without notifying the office, you may be dismissed from the clinic at the provider's discretion.      For prescription refill requests, have your pharmacy contact our office and allow 72 hours for refills to be completed.    Today you received the following: Venofer.      BELOW ARE SYMPTOMS THAT SHOULD BE REPORTED IMMEDIATELY: *FEVER GREATER THAN 100.4 F (38 C) OR HIGHER *CHILLS OR SWEATING *NAUSEA AND VOMITING THAT IS NOT CONTROLLED WITH YOUR NAUSEA MEDICATION *UNUSUAL SHORTNESS OF BREATH *UNUSUAL BRUISING OR BLEEDING *URINARY PROBLEMS (pain or burning when urinating, or frequent urination) *BOWEL PROBLEMS (unusual diarrhea, constipation, pain near the anus) TENDERNESS IN MOUTH AND THROAT WITH OR WITHOUT PRESENCE OF ULCERS (sore throat, sores in mouth, or a toothache) UNUSUAL RASH, SWELLING OR PAIN  UNUSUAL VAGINAL DISCHARGE OR ITCHING   Items with * indicate a potential emergency and should be followed up as soon as possible or go to the Emergency Department if any problems should occur.  Should you have questions after your visit or need to cancel or reschedule your appointment, please contact CANCER CENTER [place] [facility]  [phone] and follow the prompts.  Office hours are 8:00 a.m. to 4:30 p.m. Monday - Friday. Please note that voicemails left after 4:00 p.m. may not be returned until the following  business day.  We are closed weekends and major holidays. You have access to a nurse at all times for urgent questions. Please call the main number to the clinic [phone] and follow the prompts.  For any non-urgent questions, you may also contact your provider using MyChart. We now offer e-Visits for anyone 18 and older to request care online for non-urgent symptoms. For details visit [email or web address].   Also download the MyChart app! Go to the app store, search "MyChart", open the app, select [facility], and log in with your MyChart username and password.  Due to Covid, a mask is required upon entering the hospital/clinic. If you do not have a mask, one will be given to you upon arrival. For doctor visits, patients may have 1 support person aged 18 or older with them. For treatment visits, patients cannot have anyone with them due to current Covid guidelines and our immunocompromised population.  

## 2020-09-09 ENCOUNTER — Inpatient Hospital Stay: Payer: Medicare HMO

## 2020-09-09 ENCOUNTER — Other Ambulatory Visit (INDEPENDENT_AMBULATORY_CARE_PROVIDER_SITE_OTHER): Payer: Self-pay | Admitting: Vascular Surgery

## 2020-09-09 VITALS — BP 160/86 | HR 89 | Temp 97.9°F | Resp 20

## 2020-09-09 DIAGNOSIS — N185 Chronic kidney disease, stage 5: Secondary | ICD-10-CM

## 2020-09-09 DIAGNOSIS — D509 Iron deficiency anemia, unspecified: Secondary | ICD-10-CM | POA: Diagnosis not present

## 2020-09-09 DIAGNOSIS — D631 Anemia in chronic kidney disease: Secondary | ICD-10-CM | POA: Diagnosis not present

## 2020-09-09 DIAGNOSIS — N184 Chronic kidney disease, stage 4 (severe): Secondary | ICD-10-CM | POA: Diagnosis not present

## 2020-09-09 DIAGNOSIS — D508 Other iron deficiency anemias: Secondary | ICD-10-CM

## 2020-09-09 MED ORDER — SODIUM CHLORIDE 0.9 % IV SOLN: 200.0000 mg | INTRAVENOUS | Status: DC

## 2020-09-09 MED ORDER — IRON SUCROSE 20 MG/ML IV SOLN
200.0000 mg | Freq: Once | INTRAVENOUS | Status: AC
  Administered 2020-09-09: 200 mg via INTRAVENOUS
  Filled 2020-09-09: qty 10

## 2020-09-09 MED ORDER — SODIUM CHLORIDE 0.9 % IV SOLN
Freq: Once | INTRAVENOUS | Status: AC
  Filled 2020-09-09: qty 250

## 2020-09-09 NOTE — Patient Instructions (Signed)

## 2020-09-12 ENCOUNTER — Ambulatory Visit (INDEPENDENT_AMBULATORY_CARE_PROVIDER_SITE_OTHER): Payer: Medicare HMO | Admitting: Vascular Surgery

## 2020-09-12 ENCOUNTER — Ambulatory Visit (INDEPENDENT_AMBULATORY_CARE_PROVIDER_SITE_OTHER): Payer: Medicare HMO

## 2020-09-12 ENCOUNTER — Other Ambulatory Visit: Payer: Self-pay

## 2020-09-12 VITALS — BP 134/75 | HR 79 | Ht 64.0 in | Wt 212.0 lb

## 2020-09-12 DIAGNOSIS — I10 Essential (primary) hypertension: Secondary | ICD-10-CM

## 2020-09-12 DIAGNOSIS — N185 Chronic kidney disease, stage 5: Secondary | ICD-10-CM

## 2020-09-12 DIAGNOSIS — I25118 Atherosclerotic heart disease of native coronary artery with other forms of angina pectoris: Secondary | ICD-10-CM | POA: Diagnosis not present

## 2020-09-12 DIAGNOSIS — E782 Mixed hyperlipidemia: Secondary | ICD-10-CM | POA: Diagnosis not present

## 2020-09-12 NOTE — Progress Notes (Signed)
MRN : [id number]  Anna Key is a 70 y.o. (1951/02/08) female who presents with chief complaint of  Chief Complaint  Patient presents with   Follow-up    Est pt is 4/22 [place]  .  History of Present Illness:   The patient returns to the office for followup of their dialysis access. She is not yet on dialysis.  The function of the access has been stable. The patient denies hand pain or other symptoms consistent with steal phenomena.  No significant arm swelling.   The patient denies redness or swelling at the access site. The patient denies fever or chills at home or while on dialysis.   The patient denies amaurosis fugax or recent TIA symptoms. There are no recent neurological changes noted. The patient denies claudication symptoms or rest pain symptoms. The patient denies history of DVT, PE or superficial thrombophlebitis. The patient denies recent episodes of angina or shortness of breath.   Current Meds  Medication Sig   allopurinol (ZYLOPRIM) 100 MG tablet Take 100 mg by mouth every morning.    allopurinol (ZYLOPRIM) 100 MG tablet Take 1 tablet by mouth daily.   amLODipine (NORVASC) 10 MG tablet Take 10 mg by mouth every morning.    amLODipine (NORVASC) 10 MG tablet Take 1 tablet by mouth daily.   atorvastatin (LIPITOR) 40 MG tablet Take 40 mg by mouth every morning.    atorvastatin (LIPITOR) 40 MG tablet Take 1 tablet by mouth daily.   calcitRIOL (ROCALTROL) 0.25 MCG capsule Take 0.25 mcg by mouth daily.   Calcium 500-100 MG-UNIT CHEW Chew 500 mg by mouth.   isosorbide mononitrate (IMDUR) 60 MG 24 hr tablet Take 60 mg by mouth every morning.    lidocaine-prilocaine (EMLA) cream Apply topically.   lisinopril (ZESTRIL) 20 MG tablet Take 20 mg by mouth every morning.    lisinopril (ZESTRIL) 30 MG tablet Take by mouth.   metoprolol (TOPROL-XL) 200 MG 24 hr tablet Take 200 mg by mouth every morning.    sodium bicarbonate 650 MG tablet Take 2 tablets by mouth in the  morning and at bedtime.    Past Medical History:  Diagnosis Date   Anemia    Arthritis    osteoarthritis of right knee   CHF (congestive heart failure) (HCC)    Chronic kidney disease    stage 3   Gout    Hepatitis C antibody test positive    Hyperlipidemia    Hypertension     Past Surgical History:  Procedure Laterality Date   AV FISTULA PLACEMENT Left 11/27/2019   Procedure: ARTERIOVENOUS (AV) Brachiocephalic FISTULA CREATION, possible graft;  Surgeon: Katha Cabal, MD;  Location: [facility] ORS;  Service: Vascular;  Laterality: Left;   CARDIAC CATHETERIZATION     LAPAROSCOPIC LYSIS OF ADHESIONS  08/12/2019   Procedure: LAPAROSCOPIC LYSIS OF ADHESIONS;  Surgeon: Jules Husbands, MD;  Location: [facility] ORS;  Service: General;;   PARTIAL KNEE ARTHROPLASTY Right 06/11/2017   Procedure: UNICOMPARTMENTAL KNEE;  Surgeon: Corky Mull, MD;  Location: [facility] ORS;  Service: Orthopedics;  Laterality: Right;   REPLACEMENT TOTAL KNEE Left 2012    Social History Social History   Tobacco Use   Smoking status: Every Day    Packs/day: 0.50    Types: Cigarettes   Smokeless tobacco: Never  Vaping Use   Vaping Use: Never used  Substance Use Topics   Alcohol use: Never   Drug use: Not Currently    Comment: 30 years  ago    Family History Family History  Problem Relation Age of Onset   Colon cancer Mother     Allergies  Allergen Reactions   Penicillins Hives and Rash    Has patient had a PCN reaction causing immediate rash, facial/tongue/throat swelling, SOB or lightheadedness with hypotension: Yes Has patient had a PCN reaction causing severe rash involving mucus membranes or skin necrosis: Yes--all over body Has patient had a PCN reaction that required hospitalization: No  Has patient had a PCN reaction occurring within the last 10 years: No If all of the above answers are "NO", then may proceed with Cephalosporin use.      REVIEW OF SYSTEMS (Negative unless  checked)  Constitutional: [] Weight loss  [] Fever  [] Chills Cardiac: [] Chest pain   [] Chest pressure   [] Palpitations   [] Shortness of breath when laying flat   [] Shortness of breath with exertion. Vascular:  [] Pain in legs with walking   [] Pain in legs at rest  [] History of DVT   [] Phlebitis   [] Swelling in legs   [] Varicose veins   [] Non-healing ulcers Pulmonary:   [] Uses home oxygen   [] Productive cough   [] Hemoptysis   [] Wheeze  [] COPD   [] Asthma Neurologic:  [] Dizziness   [] Seizures   [] History of stroke   [] History of TIA  [] Aphasia   [] Vissual changes   [] Weakness or numbness in arm   [] Weakness or numbness in leg Musculoskeletal:   [] Joint swelling   [] Joint pain   [] Low back pain Hematologic:  [] Easy bruising  [] Easy bleeding   [] Hypercoagulable state   [] Anemic Gastrointestinal:  [] Diarrhea   [] Vomiting  [] Gastroesophageal reflux/heartburn   [] Difficulty swallowing. Genitourinary:  [x] Chronic kidney disease   [] Difficult urination  [] Frequent urination   [] Blood in urine Skin:  [] Rashes   [] Ulcers  Psychological:  [] History of anxiety   []  History of major depression.  Physical Examination  Vitals:   09/12/20 1502  BP: 134/75  Pulse: 79  Weight: 212 lb (96.2 kg)  Height: 5\' 4"  (1.626 m)   Body mass index is 36.39 kg/m. Gen: WD/WN, NAD Head: [place]/AT, No temporalis wasting.  Ear/Nose/Throat: Hearing grossly intact, nares w/o erythema or drainage Eyes: PER, EOMI, sclera nonicteric.  Neck: Supple, no large masses.   Pulmonary:  Good air movement, no audible wheezing bilaterally, no use of accessory muscles.  Cardiac: RRR, no JVD Vascular:   left AV fistula good thrill good bruit Vessel Right Left  Radial Palpable Palpable  Gastrointestinal: Non-distended. No guarding/no peritoneal signs.  Musculoskeletal: M/S 5/5 throughout.  No deformity or atrophy.  Neurologic: CN 2-12 intact. Symmetrical.  Speech is fluent. Motor exam as listed above. Psychiatric: Judgment intact, Mood &  affect appropriate for pt's clinical situation. Dermatologic: No rashes or ulcers noted.  No changes consistent with cellulitis. Lymph : No lichenification or skin changes of chronic lymphedema.  CBC Lab Results  Component Value Date   WBC 9.0 08/26/2020   HGB 8.9 (L) 08/26/2020   HCT 29.6 (L) 08/26/2020   MCV 88.6 08/26/2020   PLT 162 08/26/2020    BMET    Component Value Date/Time   NA 139 05/16/2020 1329   NA 140 08/29/2011 1925   K 4.0 05/16/2020 1329   K 3.5 08/29/2011 1925   CL 94 (L) 05/16/2020 1329   CL 104 08/29/2011 1925   CO2 31 05/16/2020 1329   CO2 25 08/29/2011 1925   GLUCOSE 106 (H) 05/16/2020 1329   GLUCOSE 79 08/29/2011 1925   BUN  82 (H) 05/16/2020 1329   BUN 25 (H) 08/29/2011 1925   CREATININE 6.72 (H) 05/16/2020 1329   CREATININE 1.60 (H) 08/29/2011 1925   CALCIUM 7.1 (L) 05/16/2020 1329   CALCIUM 9.2 08/29/2011 1925   GFRNONAA 6 (L) 05/16/2020 1329   GFRNONAA 35 (L) 08/29/2011 1925   GFRAA 9 (L) 11/12/2019 1001   GFRAA 40 (L) 08/29/2011 1925   CrCl cannot be calculated (Patient's most recent lab result is older than the maximum 21 days allowed.).  COAG Lab Results  Component Value Date   INR 0.9 11/12/2019   INR 0.90 05/29/2017    Radiology No results found.   Assessment/Plan 1. Chronic kidney disease (CKD), stage V ([place]) Recommend:  The patient is doing well and currently has adequate dialysis access.  She is not yet on dialysis.  The patient's nephrologist is not reporting any access issues.  Flow pattern is stable when compared to the prior ultrasound.  The patient should have a duplex ultrasound of the dialysis access in 12 months. The patient will follow-up with me in the office after each ultrasound    - VAS [place] (AVF, AVG); Future  2. Mixed hyperlipidemia Continue statin as ordered and reviewed, no changes at this time   3. Essential hypertension Continue antihypertensive medications as already  ordered, these medications have been reviewed and there are no changes at this time.   4. Coronary artery disease of native artery of native heart with stable angina pectoris (HCC) Continue cardiac and antihypertensive medications as already ordered and reviewed, no changes at this time.  Continue statin as ordered and reviewed, no changes at this time  Nitrates PRN for chest pain    Hortencia Pilar, MD  09/12/2020 3:14 PM

## 2020-09-15 ENCOUNTER — Inpatient Hospital Stay: Payer: Medicare HMO | Attending: Oncology

## 2020-09-15 VITALS — BP 157/84 | HR 82 | Resp 18

## 2020-09-15 DIAGNOSIS — D508 Other iron deficiency anemias: Secondary | ICD-10-CM

## 2020-09-15 MED ORDER — SODIUM CHLORIDE 0.9 % IV SOLN
Freq: Once | INTRAVENOUS | Status: AC
  Filled 2020-09-15: qty 250

## 2020-09-15 MED ORDER — SODIUM CHLORIDE 0.9 % IV SOLN: 200.0000 mg | INTRAVENOUS | Status: DC

## 2020-09-15 MED ORDER — IRON SUCROSE 20 MG/ML IV SOLN
200.0000 mg | Freq: Once | INTRAVENOUS | Status: AC
  Administered 2020-09-15: 200 mg via INTRAVENOUS
  Filled 2020-09-15: qty 10

## 2020-09-15 NOTE — Patient Instructions (Signed)
[facility]  Discharge Instructions: Thank you for choosing [facility] to provide your oncology and hematology care.  If you have a lab appointment with the [facility], please go directly to the [facility] and check in at the registration area.  Wear comfortable clothing and clothing appropriate for easy access to any Portacath or PICC line.   We strive to give you quality time with your provider. You may need to reschedule your appointment if you arrive late (15 or more minutes).  Arriving late affects you and other patients whose appointments are after yours.  Also, if you miss three or more appointments without notifying the office, you may be dismissed from the clinic at the provider's discretion.      For prescription refill requests, have your pharmacy contact our office and allow 72 hours for refills Today you received the following : Venofer   To help prevent nausea and vomiting after your treatment, we encourage you to take your nausea medication as directed.  BELOW ARE SYMPTOMS THAT SHOULD BE REPORTED IMMEDIATELY: *FEVER GREATER THAN 100.4 F (38 C) OR HIGHER *CHILLS OR SWEATING *NAUSEA AND VOMITING THAT IS NOT CONTROLLED WITH YOUR NAUSEA MEDICATION *UNUSUAL SHORTNESS OF BREATH *UNUSUAL BRUISING OR BLEEDING *URINARY PROBLEMS (pain or burning when urinating, or frequent urination) *BOWEL PROBLEMS (unusual diarrhea, constipation, pain near the anus) TENDERNESS IN MOUTH AND THROAT WITH OR WITHOUT PRESENCE OF ULCERS (sore throat, sores in mouth, or a toothache) UNUSUAL RASH, SWELLING OR PAIN  UNUSUAL VAGINAL DISCHARGE OR ITCHING   Items with * indicate a potential emergency and should be followed up as soon as possible or go to the Emergency Department if any problems should occur.  Please show the CHEMOTHERAPY ALERT CARD or IMMUNOTHERAPY ALERT CARD at check-in to the Emergency Department and triage nurse.  Should you have  questions after your visit or need to cancel or reschedule your appointment, please contact [facility]  [phone] and follow the prompts.  Office hours are 8:00 a.m. to 4:30 p.m. Monday - Friday. Please note that voicemails left after 4:00 p.m. may not be returned until the following business day.  We are closed weekends and major holidays. You have access to a nurse at all times for urgent questions. Please call the main number to the clinic [phone] and follow the prompts.  For any non-urgent questions, you may also contact your provider using MyChart. We now offer e-Visits for anyone 44 and older to request care online for non-urgent symptoms. For details visit [email or web address].   Also download the MyChart app! Go to the app store, search "MyChart", open the app, select [facility], and log in with your MyChart username and password.  Due to Covid, a mask is required upon entering the hospital/clinic. If you do not have a mask, one will be given to you upon arrival. For doctor visits, patients may have 1 support person aged 42 or older with them. For treatment visits, patients cannot have anyone with them due to current Covid guidelines and our immunocompromised population.

## 2020-09-17 ENCOUNTER — Encounter (INDEPENDENT_AMBULATORY_CARE_PROVIDER_SITE_OTHER): Payer: Self-pay | Admitting: Vascular Surgery

## 2020-09-18 ENCOUNTER — Inpatient Hospital Stay: Payer: Medicare HMO

## 2020-09-18 ENCOUNTER — Emergency Department: Payer: Medicare HMO

## 2020-09-18 ENCOUNTER — Encounter: Payer: Self-pay | Admitting: Emergency Medicine

## 2020-09-18 ENCOUNTER — Inpatient Hospital Stay
Admission: EM | Admit: 2020-09-18 | Discharge: 2020-09-24 | DRG: 291 | Disposition: A | Discharge status: Home Health | Payer: Medicare HMO | Attending: Internal Medicine | Admitting: Internal Medicine

## 2020-09-18 ENCOUNTER — Other Ambulatory Visit: Payer: Self-pay

## 2020-09-18 DIAGNOSIS — Z720 Tobacco use: Secondary | ICD-10-CM | POA: Diagnosis present

## 2020-09-18 DIAGNOSIS — Z20822 Contact with and (suspected) exposure to covid-19: Secondary | ICD-10-CM | POA: Diagnosis present

## 2020-09-18 DIAGNOSIS — R0602 Shortness of breath: Secondary | ICD-10-CM | POA: Diagnosis not present

## 2020-09-18 DIAGNOSIS — I5031 Acute diastolic (congestive) heart failure: Secondary | ICD-10-CM | POA: Diagnosis not present

## 2020-09-18 DIAGNOSIS — T502X6A Underdosing of carbonic-anhydrase inhibitors, benzothiadiazides and other diuretics, initial encounter: Secondary | ICD-10-CM | POA: Diagnosis present

## 2020-09-18 DIAGNOSIS — Z79899 Other long term (current) drug therapy: Secondary | ICD-10-CM

## 2020-09-18 DIAGNOSIS — N185 Chronic kidney disease, stage 5: Secondary | ICD-10-CM

## 2020-09-18 DIAGNOSIS — I2510 Atherosclerotic heart disease of native coronary artery without angina pectoris: Secondary | ICD-10-CM | POA: Diagnosis present

## 2020-09-18 DIAGNOSIS — T82510A Breakdown (mechanical) of surgically created arteriovenous fistula, initial encounter: Secondary | ICD-10-CM | POA: Diagnosis present

## 2020-09-18 DIAGNOSIS — I517 Cardiomegaly: Secondary | ICD-10-CM | POA: Diagnosis not present

## 2020-09-18 DIAGNOSIS — R069 Unspecified abnormalities of breathing: Secondary | ICD-10-CM | POA: Diagnosis not present

## 2020-09-18 DIAGNOSIS — Y712 Prosthetic and other implants, materials and accessory cardiovascular devices associated with adverse incidents: Secondary | ICD-10-CM | POA: Diagnosis present

## 2020-09-18 DIAGNOSIS — I340 Nonrheumatic mitral (valve) insufficiency: Secondary | ICD-10-CM | POA: Diagnosis present

## 2020-09-18 DIAGNOSIS — M109 Gout, unspecified: Secondary | ICD-10-CM | POA: Diagnosis present

## 2020-09-18 DIAGNOSIS — E785 Hyperlipidemia, unspecified: Secondary | ICD-10-CM | POA: Diagnosis present

## 2020-09-18 DIAGNOSIS — E875 Hyperkalemia: Secondary | ICD-10-CM | POA: Diagnosis present

## 2020-09-18 DIAGNOSIS — Z9114 Patient's other noncompliance with medication regimen: Secondary | ICD-10-CM | POA: Diagnosis not present

## 2020-09-18 DIAGNOSIS — N186 End stage renal disease: Secondary | ICD-10-CM | POA: Diagnosis not present

## 2020-09-18 DIAGNOSIS — I82409 Acute embolism and thrombosis of unspecified deep veins of unspecified lower extremity: Secondary | ICD-10-CM | POA: Diagnosis not present

## 2020-09-18 DIAGNOSIS — J90 Pleural effusion, not elsewhere classified: Secondary | ICD-10-CM | POA: Diagnosis not present

## 2020-09-18 DIAGNOSIS — I25118 Atherosclerotic heart disease of native coronary artery with other forms of angina pectoris: Secondary | ICD-10-CM

## 2020-09-18 DIAGNOSIS — D631 Anemia in chronic kidney disease: Secondary | ICD-10-CM | POA: Diagnosis not present

## 2020-09-18 DIAGNOSIS — Z992 Dependence on renal dialysis: Secondary | ICD-10-CM | POA: Diagnosis not present

## 2020-09-18 DIAGNOSIS — T464X5A Adverse effect of angiotensin-converting-enzyme inhibitors, initial encounter: Secondary | ICD-10-CM | POA: Diagnosis present

## 2020-09-18 DIAGNOSIS — I132 Hypertensive heart and chronic kidney disease with heart failure and with stage 5 chronic kidney disease, or end stage renal disease: Principal | ICD-10-CM | POA: Diagnosis present

## 2020-09-18 DIAGNOSIS — J9601 Acute respiratory failure with hypoxia: Principal | ICD-10-CM | POA: Diagnosis present

## 2020-09-18 DIAGNOSIS — I10 Essential (primary) hypertension: Secondary | ICD-10-CM | POA: Diagnosis not present

## 2020-09-18 DIAGNOSIS — Z96652 Presence of left artificial knee joint: Secondary | ICD-10-CM | POA: Diagnosis present

## 2020-09-18 DIAGNOSIS — N179 Acute kidney failure, unspecified: Secondary | ICD-10-CM | POA: Diagnosis not present

## 2020-09-18 DIAGNOSIS — Z880 Allergy status to penicillin: Secondary | ICD-10-CM | POA: Diagnosis not present

## 2020-09-18 DIAGNOSIS — R0902 Hypoxemia: Secondary | ICD-10-CM | POA: Diagnosis not present

## 2020-09-18 DIAGNOSIS — M899 Disorder of bone, unspecified: Secondary | ICD-10-CM | POA: Diagnosis present

## 2020-09-18 DIAGNOSIS — I5033 Acute on chronic diastolic (congestive) heart failure: Secondary | ICD-10-CM | POA: Diagnosis not present

## 2020-09-18 DIAGNOSIS — F17210 Nicotine dependence, cigarettes, uncomplicated: Secondary | ICD-10-CM | POA: Diagnosis present

## 2020-09-18 DIAGNOSIS — B182 Chronic viral hepatitis C: Secondary | ICD-10-CM | POA: Diagnosis present

## 2020-09-18 DIAGNOSIS — M1711 Unilateral primary osteoarthritis, right knee: Secondary | ICD-10-CM | POA: Diagnosis present

## 2020-09-18 DIAGNOSIS — N189 Chronic kidney disease, unspecified: Secondary | ICD-10-CM | POA: Diagnosis present

## 2020-09-18 DIAGNOSIS — J811 Chronic pulmonary edema: Secondary | ICD-10-CM | POA: Diagnosis not present

## 2020-09-18 DIAGNOSIS — T82898A Other specified complication of vascular prosthetic devices, implants and grafts, initial encounter: Secondary | ICD-10-CM | POA: Diagnosis not present

## 2020-09-18 DIAGNOSIS — J810 Acute pulmonary edema: Secondary | ICD-10-CM | POA: Diagnosis not present

## 2020-09-18 DIAGNOSIS — R0689 Other abnormalities of breathing: Secondary | ICD-10-CM | POA: Diagnosis not present

## 2020-09-18 DIAGNOSIS — Z9981 Dependence on supplemental oxygen: Secondary | ICD-10-CM

## 2020-09-18 DIAGNOSIS — K7460 Unspecified cirrhosis of liver: Secondary | ICD-10-CM | POA: Diagnosis present

## 2020-09-18 DIAGNOSIS — N2581 Secondary hyperparathyroidism of renal origin: Secondary | ICD-10-CM | POA: Diagnosis not present

## 2020-09-18 DIAGNOSIS — Z28310 Unvaccinated for covid-19: Secondary | ICD-10-CM

## 2020-09-18 DIAGNOSIS — M7989 Other specified soft tissue disorders: Secondary | ICD-10-CM | POA: Diagnosis not present

## 2020-09-18 DIAGNOSIS — I120 Hypertensive chronic kidney disease with stage 5 chronic kidney disease or end stage renal disease: Secondary | ICD-10-CM | POA: Diagnosis not present

## 2020-09-18 DIAGNOSIS — Y832 Surgical operation with anastomosis, bypass or graft as the cause of abnormal reaction of the patient, or of later complication, without mention of misadventure at the time of the procedure: Secondary | ICD-10-CM | POA: Diagnosis not present

## 2020-09-18 DIAGNOSIS — E8770 Fluid overload, unspecified: Secondary | ICD-10-CM | POA: Diagnosis not present

## 2020-09-18 LAB — BASIC METABOLIC PANEL
Anion gap: 9 (ref 5–15)
BUN: 59 mg/dL — ABNORMAL HIGH (ref 8–23)
CO2: 23 mmol/L (ref 22–32)
Calcium: 7.6 mg/dL — ABNORMAL LOW (ref 8.9–10.3)
Chloride: 108 mmol/L (ref 98–111)
Creatinine, Ser: 6.05 mg/dL — ABNORMAL HIGH (ref 0.44–1.00)
GFR, Estimated: 7 mL/min — ABNORMAL LOW (ref >60)
Glucose, Bld: 91 mg/dL (ref 70–99)
Potassium: 5.4 mmol/L — ABNORMAL HIGH (ref 3.5–5.1)
Sodium: 140 mmol/L (ref 135–145)

## 2020-09-18 LAB — TROPONIN I (HIGH SENSITIVITY)
Troponin I (High Sensitivity): 21 ng/L — ABNORMAL HIGH (ref <18)
Troponin I (High Sensitivity): 20 ng/L — ABNORMAL HIGH (ref <18)

## 2020-09-18 LAB — CBC
HCT: 32.9 % — ABNORMAL LOW (ref 36.0–46.0)
Hemoglobin: 10 g/dL — ABNORMAL LOW (ref 12.0–15.0)
MCH: 29.2 pg (ref 26.0–34.0)
MCHC: 30.4 g/dL (ref 30.0–36.0)
MCV: 96.2 fL (ref 80.0–100.0)
Platelets: 181 10*3/uL (ref 150–400)
RBC: 3.42 MIL/uL — ABNORMAL LOW (ref 3.87–5.11)
RDW: 25.4 % — ABNORMAL HIGH (ref 11.5–15.5)
WBC: 6.8 10*3/uL (ref 4.0–10.5)
nRBC: 2.5 % — ABNORMAL HIGH (ref 0.0–0.2)

## 2020-09-18 LAB — RESP PANEL BY RT-PCR (FLU A&B, COVID) ARPGX2
Influenza A by PCR: NEGATIVE
Influenza B by PCR: NEGATIVE
SARS Coronavirus 2 by RT PCR: NEGATIVE

## 2020-09-18 LAB — HEPATITIS B SURFACE ANTIGEN
Hepatitis B Surface Ag: NONREACTIVE
Hepatitis B Surface Ag: NONREACTIVE

## 2020-09-18 LAB — HEPATITIS B SURFACE ANTIBODY,QUALITATIVE: Hep B S Ab: NONREACTIVE

## 2020-09-18 LAB — BRAIN NATRIURETIC PEPTIDE: B Natriuretic Peptide: 1588.9 pg/mL — ABNORMAL HIGH (ref 0.0–100.0)

## 2020-09-18 MED ORDER — ASPIRIN EC 325 MG PO TBEC
325.0000 mg | DELAYED_RELEASE_TABLET | Freq: Every day | ORAL | Status: DC
  Administered 2020-09-18 – 2020-09-24 (×7): 325 mg via ORAL
  Filled 2020-09-18 (×7): qty 1

## 2020-09-18 MED ORDER — SODIUM CHLORIDE 0.9% FLUSH: 3.0000 mL | INTRAVENOUS | Status: DC | PRN

## 2020-09-18 MED ORDER — ONDANSETRON HCL 4 MG/2ML IJ SOLN: 4.0000 mg | Freq: Four times a day (QID) | INTRAMUSCULAR | Status: DC | PRN

## 2020-09-18 MED ORDER — SODIUM CHLORIDE 0.9% FLUSH
3.0000 mL | Freq: Two times a day (BID) | INTRAVENOUS | Status: DC
  Administered 2020-09-18 – 2020-09-24 (×9): 3 mL via INTRAVENOUS

## 2020-09-18 MED ORDER — METOPROLOL SUCCINATE ER 100 MG PO TB24
200.0000 mg | ORAL_TABLET | Freq: Every morning | ORAL | Status: DC
  Administered 2020-09-18 – 2020-09-24 (×6): 200 mg via ORAL
  Filled 2020-09-18 (×2): qty 2
  Filled 2020-09-18: qty 4
  Filled 2020-09-18 (×3): qty 2

## 2020-09-18 MED ORDER — ALLOPURINOL 100 MG PO TABS
100.0000 mg | ORAL_TABLET | Freq: Every morning | ORAL | Status: DC
  Administered 2020-09-20 – 2020-09-24 (×4): 100 mg via ORAL
  Filled 2020-09-18 (×6): qty 1

## 2020-09-18 MED ORDER — HEPARIN SODIUM (PORCINE) 1000 UNIT/ML DIALYSIS
20.0000 [IU]/kg | INTRAMUSCULAR | Status: DC | PRN
  Administered 2020-09-20: 3200 [IU] via INTRAVENOUS_CENTRAL
  Administered 2020-09-20: 1800 [IU] via INTRAVENOUS_CENTRAL
  Filled 2020-09-18 (×4): qty 2

## 2020-09-18 MED ORDER — ACETAMINOPHEN 325 MG PO TABS
650.0000 mg | ORAL_TABLET | ORAL | Status: DC | PRN
  Administered 2020-09-23: 650 mg via ORAL
  Filled 2020-09-18: qty 2

## 2020-09-18 MED ORDER — ATORVASTATIN CALCIUM 20 MG PO TABS
40.0000 mg | ORAL_TABLET | Freq: Every morning | ORAL | Status: DC
  Administered 2020-09-19 – 2020-09-24 (×6): 40 mg via ORAL
  Filled 2020-09-18 (×6): qty 2

## 2020-09-18 MED ORDER — CHLORHEXIDINE GLUCONATE CLOTH 2 % EX PADS
6.0000 | MEDICATED_PAD | Freq: Every day | CUTANEOUS | Status: DC
  Administered 2020-09-20 – 2020-09-23 (×3): 6 via TOPICAL
  Filled 2020-09-18 (×2): qty 6

## 2020-09-18 MED ORDER — CALCIUM 500-100 MG-UNIT PO CHEW: 500.0000 mg | CHEWABLE_TABLET | Freq: Every day | ORAL | Status: DC

## 2020-09-18 MED ORDER — FUROSEMIDE 10 MG/ML IJ SOLN
80.0000 mg | Freq: Two times a day (BID) | INTRAMUSCULAR | Status: DC
  Administered 2020-09-18 – 2020-09-21 (×5): 80 mg via INTRAVENOUS
  Filled 2020-09-18 (×5): qty 8

## 2020-09-18 MED ORDER — AMLODIPINE BESYLATE 10 MG PO TABS
10.0000 mg | ORAL_TABLET | Freq: Every morning | ORAL | Status: DC
  Administered 2020-09-18 – 2020-09-24 (×6): 10 mg via ORAL
  Filled 2020-09-18: qty 2
  Filled 2020-09-18 (×3): qty 1
  Filled 2020-09-18: qty 2
  Filled 2020-09-18: qty 1

## 2020-09-18 MED ORDER — SODIUM CHLORIDE 0.9 % IV SOLN: 250.0000 mL | INTRAVENOUS | Status: DC | PRN

## 2020-09-18 MED ORDER — CALCITRIOL 0.25 MCG PO CAPS
0.2500 ug | ORAL_CAPSULE | Freq: Every day | ORAL | Status: DC
  Administered 2020-09-20 – 2020-09-24 (×5): 0.25 ug via ORAL
  Filled 2020-09-18 (×6): qty 1

## 2020-09-18 MED ORDER — FUROSEMIDE 10 MG/ML IJ SOLN
80.0000 mg | Freq: Once | INTRAMUSCULAR | Status: AC
  Administered 2020-09-18: 80 mg via INTRAVENOUS
  Filled 2020-09-18: qty 8

## 2020-09-18 MED ORDER — CALCIUM CARBONATE ANTACID 500 MG PO CHEW
400.0000 mg | CHEWABLE_TABLET | Freq: Every day | ORAL | Status: DC
  Administered 2020-09-20 – 2020-09-24 (×5): 400 mg via ORAL
  Filled 2020-09-18 (×5): qty 2

## 2020-09-18 MED ORDER — HEPARIN SODIUM (PORCINE) 5000 UNIT/ML IJ SOLN
5000.0000 [IU] | Freq: Three times a day (TID) | INTRAMUSCULAR | Status: DC
  Administered 2020-09-18 – 2020-09-23 (×13): 5000 [IU] via SUBCUTANEOUS
  Filled 2020-09-18 (×12): qty 1

## 2020-09-18 MED ORDER — SODIUM BICARBONATE 650 MG PO TABS
1300.0000 mg | ORAL_TABLET | Freq: Two times a day (BID) | ORAL | Status: DC
  Administered 2020-09-20 – 2020-09-21 (×2): 1300 mg via ORAL
  Filled 2020-09-18 (×4): qty 2

## 2020-09-18 MED ORDER — ISOSORBIDE MONONITRATE ER 60 MG PO TB24
60.0000 mg | ORAL_TABLET | Freq: Every morning | ORAL | Status: DC
  Administered 2020-09-18 – 2020-09-24 (×6): 60 mg via ORAL
  Filled 2020-09-18 (×6): qty 1

## 2020-09-18 MED ORDER — LIDOCAINE-PRILOCAINE 2.5-2.5 % EX CREA
TOPICAL_CREAM | Freq: Once | CUTANEOUS | Status: DC | PRN
  Filled 2020-09-18: qty 5

## 2020-09-18 NOTE — ED Triage Notes (Signed)
Pt in via EMS from home with c/o breathing difficulties. EMS reports 88% on EMS arrival and was placed on 4L and improved to 96%.

## 2020-09-18 NOTE — ED Notes (Addendum)
Imaging staff at pt bedside, state pt attempting to get out of bed. Pt appears confused, but is able to answer questions appropriately. Pt assisted back into bed with staff assist x2, linen changed, brief changed, peri care performed, placed in hospital gown. Purewick in use. Continuous cardiac and pulse ox monitoring resumed. High fall precautions including fall alarm in use, stretcher locked in low position with side rails up x2.

## 2020-09-18 NOTE — ED Triage Notes (Signed)
Pt reports increased SOB and swelling in her legs for several days. Pt had a dialysis shunt placed recently to left arm but has not started dialysis yet. Pt reports she was told to start taking her fluid pill again but no one refilled it. Pt with #20g to right AC, placed by EMS

## 2020-09-18 NOTE — ED Triage Notes (Signed)
Pt called from [facility] to treatment room, no response 

## 2020-09-18 NOTE — Progress Notes (Signed)
[place], [place] 09/18/20  Subjective:   Hospital day # 0  Patient known to our practice from outpatient follow-up.  She has history of advanced CKD, diastolic CHF, hypertension and dyslipidemia.  She presents to the emergency room for worsening shortness of breath and lower extremity edema for the past 2 weeks or so.  She is to the point that she is tired and not able to ambulate.  States her appetite has been okay.  Patient apparently has not been taking her diuretics.    Objective:  Vital signs in last 24 hours:  Pulse Rate:  [72] 72 (08/07 1030) Resp:  [19] 19 (08/07 1030) BP: (150-156)/(87-90) 150/90 (08/07 1030) SpO2:  [96 %] 96 % (08/07 1030) Weight:  [89.8 kg] 89.8 kg (08/07 0737)  Weight change:  Filed Weights   09/18/20 0737  Weight: 89.8 kg    Intake/Output:   No intake or output data in the 24 hours ending 09/18/20 1334  Physical Exam: General:  No acute distress, laying in the bed  HEENT  anicteric, moist oral mucous membrane  Pulm/lungs  normal breathing effort, b/l crackles  CVS/Heart  regular rhythm, no rub or gallop  Abdomen:   Soft, nontender  Extremities:  ++ peripheral edema  Neurologic:  Alert, oriented, able to follow commands  Skin:  No acute rashes    Basic Metabolic Panel:  Recent Labs  Lab 09/18/20 0746  NA 140  K 5.4*  CL 108  CO2 23  GLUCOSE 91  BUN 59*  CREATININE 6.05*  CALCIUM 7.6*     CBC: Recent Labs  Lab 09/18/20 0746  WBC 6.8  HGB 10.0*  HCT 32.9*  MCV 96.2  PLT 181     No results found for: HEPBSAG, HEPBSAB, HEPBIGM    Microbiology:  Recent Results (from the past 240 hour(s))  Resp Panel by RT-PCR (Flu A&B, Covid) Nasopharyngeal Swab     Status: None   Collection Time: 09/18/20 12:19 PM   Specimen: Nasopharyngeal Swab; Nasopharyngeal(NP) swabs in vial transport medium  Result Value Ref Range Status   SARS Coronavirus 2 by RT PCR NEGATIVE NEGATIVE Final    Comment:  (NOTE) SARS-CoV-2 target nucleic acids are NOT DETECTED.  The SARS-CoV-2 RNA is generally detectable in upper respiratory specimens during the acute phase of infection. The lowest concentration of SARS-CoV-2 viral copies this assay can detect is 138 copies/mL. A negative result does not preclude SARS-Cov-2 infection and should not be used as the sole basis for treatment or other patient management decisions. A negative result may occur with  improper specimen collection/handling, submission of specimen other than nasopharyngeal swab, presence of viral mutation(s) within the areas targeted by this assay, and inadequate number of viral copies(<138 copies/mL). A negative result must be combined with clinical observations, patient history, and epidemiological information. The expected result is Negative.  Fact Sheet for Patients:  EntrepreneurPulse.com.au  Fact Sheet for Healthcare Providers:  IncredibleEmployment.be  This test is no t yet approved or cleared by the [place] FDA and  has been authorized for detection and/or diagnosis of SARS-CoV-2 by FDA under an Emergency Use Authorization (EUA). This EUA will remain  in effect (meaning this test can be used) for the duration of the COVID-19 declaration under Section 564(b)(1) of the Act, 21 U.S.C.section 360bbb-3(b)(1), unless the authorization is terminated  or revoked sooner.       Influenza A by PCR NEGATIVE NEGATIVE Final   Influenza B by PCR NEGATIVE NEGATIVE Final  Comment: (NOTE) The Xpert Xpress SARS-CoV-2/FLU/RSV plus assay is intended as an aid in the diagnosis of influenza from Nasopharyngeal swab specimens and should not be used as a sole basis for treatment. Nasal washings and aspirates are unacceptable for Xpert Xpress SARS-CoV-2/FLU/RSV testing.  Fact Sheet for Patients: EntrepreneurPulse.com.au  Fact Sheet for Healthcare  Providers: IncredibleEmployment.be  This test is not yet approved or cleared by the [place] FDA and has been authorized for detection and/or diagnosis of SARS-CoV-2 by FDA under an Emergency Use Authorization (EUA). This EUA will remain in effect (meaning this test can be used) for the duration of the COVID-19 declaration under Section 564(b)(1) of the Act, 21 U.S.C. section 360bbb-3(b)(1), unless the authorization is terminated or revoked.  Performed at [facility], [address] [place], [place] [address]     Coagulation Studies: No results for input(s): LABPROT, INR in the last 72 hours.  Urinalysis: No results for input(s): COLORURINE, LABSPEC, PHURINE, GLUCOSEU, HGBUR, BILIRUBINUR, KETONESUR, PROTEINUR, UROBILINOGEN, NITRITE, LEUKOCYTESUR in the last 72 hours.  Invalid input(s): APPERANCEUR    Imaging: DG Chest 2 View  Result Date: 09/18/2020 CLINICAL DATA:  Shortness of breath. EXAM: CHEST - 2 VIEW COMPARISON:  May 29, 2017. FINDINGS: Enlarged cardiac silhouette. Central pulmonary vascular congestion. Small bilateral pleural effusions, new. Overlying bibasilar opacities. No visible pleural effusions or pneumothorax. No acute osseous abnormality. Degenerative changes of the spine. IMPRESSION: 1. Cardiomegaly, central pulmonary vascular congestion, and new small bilateral pleural effusions. 2. Overlying bibasilar opacities, which could represent atelectasis, aspiration, and/or pneumonia. Electronically Signed   By: Margaretha Sheffield MD   On: 09/18/2020 08:22     Medications:    sodium chloride      allopurinol  100 mg Oral q morning   amLODipine  10 mg Oral q morning   aspirin EC  325 mg Oral Daily   atorvastatin  40 mg Oral q morning   calcitRIOL  0.25 mcg Oral Daily   Calcium  500 mg Oral Daily   furosemide  80 mg Intravenous Q12H   heparin  5,000 Units Subcutaneous Q8H   isosorbide mononitrate  60 mg Oral q morning    lidocaine-prilocaine   Topical Once   metoprolol  200 mg Oral q morning   sodium bicarbonate  1,300 mg Oral BID   sodium chloride flush  3 mL Intravenous Q12H   sodium chloride, acetaminophen, ondansetron (ZOFRAN) IV, sodium chloride flush  Assessment/ Plan:  70 y.o. female with advanced chronic kidney disease, hypertension, osteoarthritis with history of left knee replacement 2021, gout, chronic hep C treated in 2020, cirrhosis, past history of alcohol use, LVH, diastolic dysfunction, current smoker, coronary disease with history of angioplasty admitted on 09/18/2020 for Acute diastolic CHF (congestive heart failure) ([place]) [I50.31]  #Chronic kidney disease stage V, now presents with uremia and volume overload.  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 She has a functioning left arm AV fistula which has not been used Will plan on starting dialysis via AV fistula tomorrow Start outpatient discharge planning Agree with moderate dose of IV Lasix for volume control May be able to restart ACE inhibitor dialysis is initiated  #Hyperkalemia Expected to improve with diuretics and dialysis  #Anemia of chronic kidney disease Lab Results  Component Value Date   HGB 10.0 (L) 09/18/2020    #Secondary hyperparathyroidism of renal origin Lab Results  Component Value Date   CALCIUM 7.6 (L) 09/18/2020  Continue calcitriol We will add calcium supplements  LOS: 0 Atwell Mcdanel 8/7/20221:34 PM  [place], [place]  Note: This note was prepared with Dragon dictation. Any transcription errors are unintentional

## 2020-09-18 NOTE — ED Provider Notes (Signed)
[facility]  ____________________________________________   Event Date/Time   First MD Initiated Contact with Patient 09/18/20 [phone]     (approximate)  I have reviewed the triage vital signs and the nursing notes.   HISTORY  Chief Complaint Leg Swelling and Shortness of Breath    HPI Anna Key is a 70 y.o. female past medical history of CKD, CHF, hypertension, hyperlipidemia who presents with shortness of breath and lower extremity swelling.  Patient notes that she has had some dyspnea and swelling over the last several months.  However today she was barely able to ambulate.  Notes that her legs are much more swollen than normal.  She denies fevers, chills or cough.  She denies chest pain, nausea vomiting or abdominal pain.  She follows with [facility] and is in the process of maturing a left arm fistula.  She has not yet been started on dialysis.  Denies hemoptysis, history of DVT/PE.         Past Medical History:  Diagnosis Date   Anemia    Arthritis    osteoarthritis of right knee   CHF (congestive heart failure) (HCC)    Chronic kidney disease    stage 3   Gout    Hepatitis C antibody test positive    Hyperlipidemia    Hypertension     Patient Active Problem List   Diagnosis Date Noted   Acute diastolic CHF (congestive heart failure) ([place]) 09/18/2020   Hyperkalemia 09/18/2020   Iron deficiency anemia 05/24/2020   Anemia in chronic kidney disease 03/08/2020   Cigarette smoker 03/08/2020   Edema of lower extremity 03/08/2020   History of hepatitis C 03/08/2020   Hyperparathyroidism due to renal insufficiency ([place]) 03/08/2020   Persistent proteinuria 03/08/2020   Renal insufficiency 09/02/2019   Essential hypertension 09/02/2019   Hyperlipidemia 09/02/2019   CAD (coronary artery disease) 09/02/2019   Hypocalcemia 06/04/2019   Chronic viral hepatitis C ([place]) 10/22/2017   Status post right partial knee replacement  06/11/2017   Healthcare maintenance 04/04/2017   LVH (left ventricular hypertrophy) due to hypertensive disease 04/04/2017   Chronic kidney disease, stage 4 (severe) ([place]) 04/04/2017   Primary osteoarthritis of right knee 06/27/2015   Shoulder pain, left 01/19/2014   Chronic kidney disease (CKD), stage V ([place]) 06/12/2013   Gout 06/05/2012   Tobacco use 06/05/2012   Primary localized osteoarthrosis, lower leg 06/09/2010   Atherosclerotic heart disease of native coronary artery without angina pectoris 06/02/2009    Past Surgical History:  Procedure Laterality Date   AV FISTULA PLACEMENT Left 11/27/2019   Procedure: ARTERIOVENOUS (AV) Brachiocephalic FISTULA CREATION, possible graft;  Surgeon: Katha Cabal, MD;  Location: [facility] ORS;  Service: Vascular;  Laterality: Left;   CARDIAC CATHETERIZATION     LAPAROSCOPIC LYSIS OF ADHESIONS  08/12/2019   Procedure: LAPAROSCOPIC LYSIS OF ADHESIONS;  Surgeon: Jules Husbands, MD;  Location: [facility];  Service: General;;   PARTIAL KNEE ARTHROPLASTY Right 06/11/2017   Procedure: UNICOMPARTMENTAL KNEE;  Surgeon: Corky Mull, MD;  Location: [facility] OR[facility];  Service: Orthopedics;  Laterality: Right;   REPLACEMENT TOTAL KNEE Left 2012    Prior to Admission medications   Medication Sig Start Date End Date Taking? Authorizing Provider  allopurinol (ZYLOPRIM) 100 MG tablet Take 100 mg by mouth every morning.     [provider]  amLODipine (NORVASC) 10 MG tablet Take 1 tablet by mouth daily. 07/24/16   [provider]  atorvastatin (LIPITOR) 40 MG  tablet Take 40 mg by mouth every morning.     [provider]  calcitRIOL (ROCALTROL) 0.25 MCG capsule Take 0.25 mcg by mouth daily. 11/06/19   [provider]  Calcium 500-100 MG-UNIT CHEW Chew 500 mg by mouth.    [provider]  isosorbide mononitrate (IMDUR) 60 MG 24 hr tablet Take 60 mg by mouth every morning.     [provider]  lidocaine-prilocaine (EMLA)  cream Apply topically. 03/08/20 06/22/21  [provider]  lisinopril (ZESTRIL) 30 MG tablet Take by mouth. 05/27/20   [provider]  metoprolol (TOPROL-XL) 200 MG 24 hr tablet Take 200 mg by mouth every morning.     [provider]  polyethylene glycol-electrolytes (NULYTELY) 420 g solution Take by mouth. 09/08/20   [provider]  sodium bicarbonate 650 MG tablet Take 2 tablets by mouth in the morning and at bedtime. 04/20/20 04/20/21  [provider]  torsemide (DEMADEX) 20 MG tablet Take 20 mg by mouth 2 (two) times daily. 06/04/19 06/03/20  [provider]    Allergies Penicillins  Family History  Problem Relation Age of Onset   Colon cancer Mother     Social History Social History   Tobacco Use   Smoking status: Every Day    Packs/day: 0.50    Types: Cigarettes   Smokeless tobacco: Never  Vaping Use   Vaping Use: Never used  Substance Use Topics   Alcohol use: Never   Drug use: Not Currently    Comment: 30 years ago    Review of Systems   Review of Systems  Constitutional:  Negative for chills, fatigue and fever.  Respiratory:  Positive for shortness of breath. Negative for chest tightness.   Cardiovascular:  Positive for leg swelling. Negative for chest pain.  Gastrointestinal:  Negative for abdominal distention, nausea and vomiting.  All other systems reviewed and are negative.  Physical Exam Updated Vital Signs BP (!) 150/90   Pulse 72   Resp 17   Ht 5\' 4"  (1.626 m)   Wt 89.8 kg   SpO2 98%   BMI 33.99 kg/m   Physical Exam Vitals and nursing note reviewed.  Constitutional:      General: She is not in acute distress.    Appearance: Normal appearance. She is not toxic-appearing.     Comments: Mildly dyspneic  HENT:     Head: Normocephalic and atraumatic.     Nose: Nose normal. No congestion.     Mouth/Throat:     Mouth: Mucous membranes are moist.  Eyes:     General: No scleral icterus.     Conjunctiva/sclera: Conjunctivae normal.  Cardiovascular:     Rate and Rhythm: Normal rate and regular rhythm.  Pulmonary:     Effort: Respiratory distress present.     Breath sounds: No wheezing.     Comments: Patient is in mild respiratory distress Crackles at the bases Abdominal:     Palpations: Abdomen is soft.     Tenderness: There is no abdominal tenderness. There is no guarding.  Musculoskeletal:        General: No swelling, tenderness, deformity or signs of injury. Normal range of motion.     Cervical back: Neck supple. No rigidity.     Right lower leg: Edema present.     Left lower leg: Edema present.     Comments: Significant 3+ edema in the bilateral lower extremities from foot to knee  Skin:    General: Skin is  warm and dry.     Coloration: Skin is not jaundiced.     Comments: Maturing fistula in the left upper extremity with palpable thrill  Neurological:     General: No focal deficit present.     Mental Status: She is alert and oriented to person, place, and time.  Psychiatric:        Mood and Affect: Mood normal.        Behavior: Behavior normal.     LABS (all labs ordered are listed, but only abnormal results are displayed)  Labs Reviewed  BASIC METABOLIC PANEL - Abnormal; Notable for the following components:      Result Value   Potassium 5.4 (*)    BUN 59 (*)    Creatinine, Ser 6.05 (*)    Calcium 7.6 (*)    GFR, Estimated 7 (*)    All other components within normal limits  CBC - Abnormal; Notable for the following components:   RBC 3.42 (*)    Hemoglobin 10.0 (*)    HCT 32.9 (*)    RDW 25.4 (*)    nRBC 2.5 (*)    All other components within normal limits  BRAIN NATRIURETIC PEPTIDE - Abnormal; Notable for the following components:   B Natriuretic Peptide 1,588.9 (*)    All other components within normal limits  TROPONIN I (HIGH SENSITIVITY) - Abnormal; Notable for the following components:   Troponin I (High Sensitivity) 21 (*)    All other  components within normal limits  TROPONIN I (HIGH SENSITIVITY) - Abnormal; Notable for the following components:   Troponin I (High Sensitivity) 20 (*)    All other components within normal limits  RESP PANEL BY RT-PCR (FLU A&B, COVID) ARPGX2  HIV ANTIBODY (ROUTINE TESTING W REFLEX)  HEPATITIS C ANTIBODY   ____________________________________________  EKG  NSR  Nml axis TWI in the lateral leads  No STEMI ____________________________________________  RADIOLOGY I, Madelin Headings, personally viewed and evaluated these images (plain radiographs) as part of my medical decision making, as well as reviewing the written report by the radiologist.  ED MD interpretation: I reviewed the chest x-ray which shows pulmonary edema and bilateral effusions    ____________________________________________   PROCEDURES  Procedure(s) performed (including Critical Care):  Procedures   ____________________________________________   INITIAL IMPRESSION / ASSESSMENT AND PLAN / ED COURSE     Patient is a 70 year old female with CKD not yet on dialysis who presents with volume overload and pulmonary edema.  She is requiring 4 L nasal cannula initially satting 88% on room air.  She appears mildly dyspneic especially when moving to the bed but overall not requiring positive pressure ventilation.  She is grossly volume overloaded with significant lower extremity edema and crackles at the bases.  Labs show an elevated BNP, her creatinine is fairly stable, potassium is 5.4 without EKG changes.  Her troponin is minimally elevated at 21 which I suspect is due to demand as she has no active chest pain.  Will diurese.  Patient will require admission and will benefit from an echo.  Clinical Course as of 09/18/20 1532  Sun Sep 18, 2020  2122 IMPRESSION: 1. Cardiomegaly, central pulmonary vascular congestion, and new small bilateral pleural effusions. 2. Overlying bibasilar opacities, which could represent  atelectasis, aspiration, and/or pneumonia   [KM]    Clinical Course User Index [KM] Rada Hay, MD     ____________________________________________   FINAL CLINICAL IMPRESSION(S) / ED DIAGNOSES  Final diagnoses:  Acute respiratory  failure with hypoxia (HCC)  Acute pulmonary edema (HCC)  Stage 5 chronic kidney disease not on chronic dialysis [facility])     ED Discharge Orders     None        Note:  This document was prepared using Dragon voice recognition software and may include unintentional dictation errors.    Rada Hay, MD 09/18/20 [phone]

## 2020-09-18 NOTE — H&P (Addendum)
History and Physical    Anna Key BTD:[id number] DOB: Feb 23, 1950 DOA: 09/18/2020  PCP: Kirk Ruths, MD   Patient coming from: Home  I have personally briefly reviewed patient's old medical records in [place]  Chief Complaint: Shortness of breath  HPI: Anna Key is a 70 y.o. female with medical history significant for stage V chronic kidney disease, chronic diastolic dysfunction CHF, hypertension and dyslipidemia who presents to the ER for evaluation of shortness of breath and lower extremity swelling which has progressively worsened over the last couple of weeks.  Patient states she is unable to ambulate due to swelling in her legs. She states that she is supposed to be on torsemide but has not received the medication in the mail. She denies having any cough, no fever, no chills, no chest pain, no palpitations, no diaphoresis, no nausea, no vomiting, no abdominal pain, no changes in her bowel habits, no urinary symptoms, no dizziness, no lightheadedness She is status post AV fistula placement (11/27/19) but is not yet on hemodialysis. Labs show sodium 140, potassium 5.4, chloride 108, bicarb 23, glucose 91, BUN 59, creatinine 6.05, calcium 7.6, BNP 1588, troponin 21, white count 3.8, hemoglobin 10, hematocrit 32.9, MCV 96.2, RDW 25.4, platelet count 181 Respiratory viral panel is pending Chest x-ray shows cardiomegaly, central pulmonary vascular congestion and new small bilateral pleural effusions. Twelve-lead EKG reviewed by me shows sinus rhythm with ST and T wave depression in the lateral leads.   ED Course: Patient is a 70 year old African-American female with a history of stage V chronic kidney disease not yet on hemodialysis who presents to the ER for evaluation of worsening shortness of breath from her baseline mostly with exertion and bilateral lower extremity swelling. Chest x-ray shows bilateral pleural effusions consistent with CHF. Patient is  supposed to be on torsemide but has not been on it for weeks now. She received Lasix 80 mg IV push and will be admitted to the hospital for further evaluation.   Review of Systems: As per HPI otherwise all other systems reviewed and negative.    Past Medical History:  Diagnosis Date   Anemia    Arthritis    osteoarthritis of right knee   CHF (congestive heart failure) (HCC)    Chronic kidney disease    stage 3   Gout    Hepatitis C antibody test positive    Hyperlipidemia    Hypertension     Past Surgical History:  Procedure Laterality Date   AV FISTULA PLACEMENT Left 11/27/2019   Procedure: ARTERIOVENOUS (AV) Brachiocephalic FISTULA CREATION, possible graft;  Surgeon: Katha Cabal, MD;  Location: [facility] ORS;  Service: Vascular;  Laterality: Left;   CARDIAC CATHETERIZATION     LAPAROSCOPIC LYSIS OF ADHESIONS  08/12/2019   Procedure: LAPAROSCOPIC LYSIS OF ADHESIONS;  Surgeon: Jules Husbands, MD;  Location: [facility];  Service: General;;   PARTIAL KNEE ARTHROPLASTY Right 06/11/2017   Procedure: UNICOMPARTMENTAL KNEE;  Surgeon: Corky Mull, MD;  Location: [facility];  Service: Orthopedics;  Laterality: Right;   REPLACEMENT TOTAL KNEE Left 2012     reports that she has been smoking cigarettes. She has been smoking an average of .5 packs per day. She has never used smokeless tobacco. She reports previous drug use. She reports that she does not drink alcohol.  Allergies  Allergen Reactions   Penicillins Hives and Rash    Has patient had a PCN reaction causing immediate rash, facial/tongue/throat swelling, SOB or lightheadedness  with hypotension: Yes Has patient had a PCN reaction causing severe rash involving mucus membranes or skin necrosis: Yes--all over body Has patient had a PCN reaction that required hospitalization: No  Has patient had a PCN reaction occurring within the last 10 years: No If all of the above answers are "NO", then may proceed with Cephalosporin use.      Family History  Problem Relation Age of Onset   Colon cancer Mother       Prior to Admission medications   Medication Sig Start Date End Date Taking? Authorizing Provider  allopurinol (ZYLOPRIM) 100 MG tablet Take 100 mg by mouth every morning.     [provider]  allopurinol (ZYLOPRIM) 100 MG tablet Take 1 tablet by mouth daily. 05/17/16   [provider]  amLODipine (NORVASC) 10 MG tablet Take 10 mg by mouth every morning.     [provider]  amLODipine (NORVASC) 10 MG tablet Take 1 tablet by mouth daily. 07/24/16   [provider]  atorvastatin (LIPITOR) 40 MG tablet Take 40 mg by mouth every morning.     [provider]  atorvastatin (LIPITOR) 40 MG tablet Take 1 tablet by mouth daily. 01/24/16   [provider]  calcitRIOL (ROCALTROL) 0.25 MCG capsule Take 0.25 mcg by mouth daily. 11/06/19   [provider]  Calcium 500-100 MG-UNIT CHEW Chew 500 mg by mouth.    [provider]  isosorbide mononitrate (IMDUR) 60 MG 24 hr tablet Take 60 mg by mouth every morning.     [provider]  lidocaine-prilocaine (EMLA) cream Apply topically. 03/08/20 06/22/21  [provider]  lisinopril (ZESTRIL) 20 MG tablet Take 20 mg by mouth every morning.     [provider]  lisinopril (ZESTRIL) 30 MG tablet Take by mouth. 05/27/20   [provider]  metoprolol (TOPROL-XL) 200 MG 24 hr tablet Take 200 mg by mouth every morning.     [provider]  sodium bicarbonate 650 MG tablet Take 2 tablets by mouth in the morning and at bedtime. 04/20/20 04/20/21  [provider]  torsemide (DEMADEX) 20 MG tablet Take 40 mg by mouth daily.  06/04/19 06/03/20  [provider]    Physical Exam: Vitals:   09/18/20 0737 09/18/20 1001 09/18/20 1030  BP:  (!) 156/87 (!) 150/90  Pulse:  72 72  Resp:   19  SpO2:  96% 96%  Weight: 89.8 kg    Height: 5\' 4"  (1.626 m)       Vitals:    09/18/20 0737 09/18/20 1001 09/18/20 1030  BP:  (!) 156/87 (!) 150/90  Pulse:  72 72  Resp:   19  SpO2:  96% 96%  Weight: 89.8 kg    Height: 5\' 4"  (1.626 m)        Constitutional: Alert and oriented x 3 . Not in any apparent distress HEENT:      Head: Normocephalic and atraumatic.         Eyes: PERLA, EOMI, Conjunctivae are pale. Sclera is non-icteric.       Mouth/Throat: Mucous membranes are moist.       Neck: Supple with no signs of meningismus. Cardiovascular: Regular rate and rhythm. No murmurs, gallops, or rubs. 2+ symmetrical distal pulses are present . No JVD. 3+ LE edema Respiratory: Respiratory effort normal .crackles in both lung fields bilaterally. No wheezes or rhonchi.  Gastrointestinal: Soft, non tender, and non distended with positive bowel sounds.  Genitourinary: No CVA  tenderness. Musculoskeletal: Nontender with normal range of motion in all extremities. No cyanosis, or erythema of extremities. Neurologic:  Face is symmetric. Moving all extremities. No gross focal neurologic deficits . Skin: Skin is warm, dry.  No rash or ulcers. Maturing fistula in the left upper extremity with palpable thrill  Psychiatric: Depressed mood and flat affect   Labs on Admission: I have personally reviewed following labs and imaging studies  CBC: Recent Labs  Lab 09/18/20 0746  WBC 6.8  HGB 10.0*  HCT 32.9*  MCV 96.2  PLT 295   Basic Metabolic Panel: Recent Labs  Lab 09/18/20 0746  NA 140  K 5.4*  CL 108  CO2 23  GLUCOSE 91  BUN 59*  CREATININE 6.05*  CALCIUM 7.6*   GFR: Estimated Creatinine Clearance: 9.5 mL/min (A) (by C-G formula based on SCr of 6.05 mg/dL (H)). Liver Function Tests: No results for input(s): AST, ALT, ALKPHOS, BILITOT, PROT, ALBUMIN in the last 168 hours. No results for input(s): LIPASE, AMYLASE in the last 168 hours. No results for input(s): AMMONIA in the last 168 hours. Coagulation Profile: No results for input(s): INR, PROTIME in the  last 168 hours. Cardiac Enzymes: No results for input(s): CKTOTAL, CKMB, CKMBINDEX, TROPONINI in the last 168 hours. BNP (last 3 results) No results for input(s): PROBNP in the last 8760 hours. HbA1C: No results for input(s): HGBA1C in the last 72 hours. CBG: No results for input(s): GLUCAP in the last 168 hours. Lipid Profile: No results for input(s): CHOL, HDL, LDLCALC, TRIG, CHOLHDL, LDLDIRECT in the last 72 hours. Thyroid Function Tests: No results for input(s): TSH, T4TOTAL, FREET4, T3FREE, THYROIDAB in the last 72 hours. Anemia Panel: No results for input(s): VITAMINB12, FOLATE, FERRITIN, TIBC, IRON, RETICCTPCT in the last 72 hours. Urine analysis:    Component Value Date/Time   COLORURINE YELLOW (A) 05/29/2017 1336   APPEARANCEUR CLEAR (A) 05/29/2017 1336   LABSPEC 1.012 05/29/2017 1336   PHURINE 5.0 05/29/2017 1336   GLUCOSEU 50 (A) 05/29/2017 1336   HGBUR NEGATIVE 05/29/2017 1336   BILIRUBINUR NEGATIVE 05/29/2017 1336   KETONESUR NEGATIVE 05/29/2017 1336   PROTEINUR 100 (A) 05/29/2017 1336   NITRITE NEGATIVE 05/29/2017 1336   LEUKOCYTESUR NEGATIVE 05/29/2017 1336    Radiological Exams on Admission: DG Chest 2 View  Result Date: 09/18/2020 CLINICAL DATA:  Shortness of breath. EXAM: CHEST - 2 VIEW COMPARISON:  May 29, 2017. FINDINGS: Enlarged cardiac silhouette. Central pulmonary vascular congestion. Small bilateral pleural effusions, new. Overlying bibasilar opacities. No visible pleural effusions or pneumothorax. No acute osseous abnormality. Degenerative changes of the spine. IMPRESSION: 1. Cardiomegaly, central pulmonary vascular congestion, and new small bilateral pleural effusions. 2. Overlying bibasilar opacities, which could represent atelectasis, aspiration, and/or pneumonia. Electronically Signed   By: Margaretha Sheffield MD   On: 09/18/2020 08:22     Assessment/Plan Principal Problem:   Acute diastolic CHF (congestive heart failure) (HCC) Active Problems:    Essential hypertension   CAD (coronary artery disease)   Tobacco use   Chronic kidney disease (CKD), stage V (HCC)   Anemia in chronic kidney disease   Cigarette smoker   Hyperkalemia    Acute diastolic dysfunction CHF Patient presents to the ER for evaluation of worsening shortness of breath and bilateral lower extremity swelling Chest x-ray shows bilateral pleural effusions and BNP is elevated Patient's last 2D echocardiogram from 2018 shows an LVEF of 60 to 65% with grade 2 diastolic dysfunction CHF. Will place patient on furosemide 80 mg IV every  12 Hold lisinopril due to hyperkalemia Continue metoprolol     Stage V chronic kidney disease With hyperkalemia and acute CHF Will consult nephrology for recommendations about initiation of renal replacement therapy Patient has an AV fistula in her left upper arm Continue bicarbonate    Hypertension Continue amlodipine, metoprolol and nitrates    Nicotine dependence Smoking cessation has been discussed with patient in detail She declines a nicotine transdermal patch at this time      Anemia of chronic kidney disease H&H is stable     Hyperkalemia Secondary to ACE inhibitor use as well as chronic kidney disease Hold lisinopril  DVT prophylaxis: Heparin Code Status: full code  Family Communication: Greater than 50% of time was spent discussing patient's condition and plan of care with her at the bedside.  All questions and concerns have been addressed.  She verbalizes understanding and agrees with the plan. Disposition Plan: Back to previous home environment Consults called: Nephrology Status: At the time of admission, it appears that the appropriate admission status for this patient is inpatient. This is judged to be reasonable and necessary in order to provide the required intensity of service to ensure the patient's safety given the presenting symptoms, physical exam findings and initial radiographic and  laboratory data in the context of their comorbid conditions. Patient requires inpatient status due to high intensity of service, high risk of further deterioration and high frequency of surveillance required.    Collier Bullock MD [facility]     09/18/2020, 1:06 PM

## 2020-09-18 NOTE — ED Notes (Signed)
RN in room due to alarm beeping. Pt had taken O2 off again. Pt placed back on O2 and sats improved.

## 2020-09-18 NOTE — ED Notes (Signed)
RN in room due to alarm beeping for low SpO2 level. Pt had taken oxygen off and was sleeping. Oxygen placed back on pt. Pt informed that she needed to keep O2 on while sleeping due to her sats dropping when she takes it off.

## 2020-09-19 LAB — HEPATITIS B CORE ANTIBODY, TOTAL: Hep B Core Total Ab: REACTIVE — AB

## 2020-09-19 LAB — BASIC METABOLIC PANEL
Anion gap: 5 (ref 5–15)
BUN: 64 mg/dL — ABNORMAL HIGH (ref 8–23)
CO2: 28 mmol/L (ref 22–32)
Calcium: 7.7 mg/dL — ABNORMAL LOW (ref 8.9–10.3)
Chloride: 110 mmol/L (ref 98–111)
Creatinine, Ser: 6.41 mg/dL — ABNORMAL HIGH (ref 0.44–1.00)
GFR, Estimated: 7 mL/min — ABNORMAL LOW (ref >60)
Glucose, Bld: 142 mg/dL — ABNORMAL HIGH (ref 70–99)
Potassium: 5.5 mmol/L — ABNORMAL HIGH (ref 3.5–5.1)
Sodium: 143 mmol/L (ref 135–145)

## 2020-09-19 LAB — HEPATITIS C ANTIBODY: HCV Ab: REACTIVE — AB

## 2020-09-19 LAB — HIV ANTIBODY (ROUTINE TESTING W REFLEX): HIV Screen 4th Generation wRfx: NONREACTIVE

## 2020-09-19 NOTE — Progress Notes (Signed)
[place], [place] 09/19/20  Subjective:   Hospital day # 1  Patient known to our practice from outpatient follow-up.  She has history of advanced CKD, diastolic CHF, hypertension and dyslipidemia.  She presents to the emergency room for worsening shortness of breath and lower extremity edema for the past 2 weeks or so.    Patient seen during dialysis   HEMODIALYSIS FLOWSHEET:  Blood Flow Rate (mL/min): 200 mL/min Arterial Pressure (mmHg): -140 mmHg Venous Pressure (mmHg): 170 mmHg Transmembrane Pressure (mmHg): (P) 7 mmHg Ultrafiltration Rate (mL/min): 750 mL/min Dialysate Flow Rate (mL/min): 300 ml/min Conductivity: Machine : 13.9 Conductivity: Machine : 13.9  First hemodialysis treatment today Tolerated well Denies shortness of breath No complaints at this time    Objective:  Vital signs in last 24 hours:  Temp:  [97.7 F (36.5 C)-98.2 F (36.8 C)] 98.2 F (36.8 C) (08/08 1300) Pulse Rate:  [67-83] 75 (08/08 1100) Resp:  [16-26] 19 (08/08 1400) BP: (114-162)/(68-92) 135/77 (08/08 1400) SpO2:  [90 %-100 %] 96 % (08/08 1400) Weight:  [99.6 kg] 99.6 kg (08/07 2223)  Weight change:  Filed Weights   09/18/20 0737 09/18/20 1958 09/18/20 2223  Weight: 89.8 kg 99.6 kg 99.6 kg    Intake/Output:    Intake/Output Summary (Last 24 hours) at 09/19/2020 1545 Last data filed at 09/19/2020 1406 Gross per 24 hour  Intake --  Output 2100 ml  Net -2100 ml    Physical Exam: General:  No acute distress, laying in the bed  HEENT  anicteric, moist oral mucous membrane  Pulm/lungs  normal breathing effort, b/l crackles  CVS/Heart  regular rhythm, no rub or gallop  Abdomen:   Soft, nontender  Extremities:  ++ peripheral edema  Neurologic:  Alert, oriented, able to follow commands  Skin:  No acute rashes    Basic Metabolic Panel:  Recent Labs  Lab 09/18/20 0746 09/19/20 0530  NA 140 143  K 5.4* 5.5*  CL 108 110  CO2 23 28  GLUCOSE 91  142*  BUN 59* 64*  CREATININE 6.05* 6.41*  CALCIUM 7.6* 7.7*      CBC: Recent Labs  Lab 09/18/20 0746  WBC 6.8  HGB 10.0*  HCT 32.9*  MCV 96.2  PLT 181       Lab Results  Component Value Date   HEPBSAG NON REACTIVE 09/18/2020   HEPBSAG NON REACTIVE 09/18/2020   HEPBSAB NON REACTIVE 09/18/2020      Microbiology:  Recent Results (from the past 240 hour(s))  Resp Panel by RT-PCR (Flu A&B, Covid) Nasopharyngeal Swab     Status: None   Collection Time: 09/18/20 12:19 PM   Specimen: Nasopharyngeal Swab; Nasopharyngeal(NP) swabs in vial transport medium  Result Value Ref Range Status   SARS Coronavirus 2 by RT PCR NEGATIVE NEGATIVE Final    Comment: (NOTE) SARS-CoV-2 target nucleic acids are NOT DETECTED.  The SARS-CoV-2 RNA is generally detectable in upper respiratory specimens during the acute phase of infection. The lowest concentration of SARS-CoV-2 viral copies this assay can detect is 138 copies/mL. A negative result does not preclude SARS-Cov-2 infection and should not be used as the sole basis for treatment or other patient management decisions. A negative result may occur with  improper specimen collection/handling, submission of specimen other than nasopharyngeal swab, presence of viral mutation(s) within the areas targeted by this assay, and inadequate number of viral copies(<138 copies/mL). A negative result must be combined with clinical observations, patient history, and epidemiological information.  The expected result is Negative.  Fact Sheet for Patients:  EntrepreneurPulse.com.au  Fact Sheet for Healthcare Providers:  IncredibleEmployment.be  This test is no t yet approved or cleared by the [place] FDA and  has been authorized for detection and/or diagnosis of SARS-CoV-2 by FDA under an Emergency Use Authorization (EUA). This EUA will remain  in effect (meaning this test can be used) for the duration of  the COVID-19 declaration under Section 564(b)(1) of the Act, 21 U.S.C.section 360bbb-3(b)(1), unless the authorization is terminated  or revoked sooner.       Influenza A by PCR NEGATIVE NEGATIVE Final   Influenza B by PCR NEGATIVE NEGATIVE Final    Comment: (NOTE) The Xpert Xpress SARS-CoV-2/FLU/RSV plus assay is intended as an aid in the diagnosis of influenza from Nasopharyngeal swab specimens and should not be used as a sole basis for treatment. Nasal washings and aspirates are unacceptable for Xpert Xpress SARS-CoV-2/FLU/RSV testing.  Fact Sheet for Patients: EntrepreneurPulse.com.au  Fact Sheet for Healthcare Providers: IncredibleEmployment.be  This test is not yet approved or cleared by the [place] FDA and has been authorized for detection and/or diagnosis of SARS-CoV-2 by FDA under an Emergency Use Authorization (EUA). This EUA will remain in effect (meaning this test can be used) for the duration of the COVID-19 declaration under Section 564(b)(1) of the Act, 21 U.S.C. section 360bbb-3(b)(1), unless the authorization is terminated or revoked.  Performed at [facility], [address] [place], [place] [address]     Coagulation Studies: No results for input(s): LABPROT, INR in the last 72 hours.  Urinalysis: No results for input(s): COLORURINE, LABSPEC, PHURINE, GLUCOSEU, HGBUR, BILIRUBINUR, KETONESUR, PROTEINUR, UROBILINOGEN, NITRITE, LEUKOCYTESUR in the last 72 hours.  Invalid input(s): APPERANCEUR    Imaging: DG Chest 2 View  Result Date: 09/18/2020 CLINICAL DATA:  Shortness of breath. EXAM: CHEST - 2 VIEW COMPARISON:  May 29, 2017. FINDINGS: Enlarged cardiac silhouette. Central pulmonary vascular congestion. Small bilateral pleural effusions, new. Overlying bibasilar opacities. No visible pleural effusions or pneumothorax. No acute osseous abnormality. Degenerative changes of the spine. IMPRESSION: 1.  Cardiomegaly, central pulmonary vascular congestion, and new small bilateral pleural effusions. 2. Overlying bibasilar opacities, which could represent atelectasis, aspiration, and/or pneumonia. Electronically Signed   By: Margaretha Sheffield MD   On: 09/18/2020 08:22   US Venous Img Lower Bilateral (DVT)  Result Date: 09/18/2020 CLINICAL DATA:  70 year old female with leg swelling for 1 month. EXAM: BILATERAL LOWER EXTREMITY VENOUS DOPPLER ULTRASOUND TECHNIQUE: Gray-scale sonography with graded compression, as well as color Doppler and duplex ultrasound were performed to evaluate the lower extremity deep venous systems from the level of the common femoral vein and including the common femoral, femoral, profunda femoral, popliteal and calf veins including the posterior tibial, peroneal and gastrocnemius veins when visible. The superficial great saphenous vein was also interrogated. Spectral Doppler was utilized to evaluate flow at rest and with distal augmentation maneuvers in the common femoral, femoral and popliteal veins. COMPARISON:  None. FINDINGS: RIGHT LOWER EXTREMITY Common Femoral Vein: No evidence of thrombus. Normal compressibility, respiratory phasicity and response to augmentation. Saphenofemoral Junction: No evidence of thrombus. Normal compressibility and flow on color Doppler imaging. Profunda Femoral Vein: No evidence of thrombus. Normal compressibility and flow on color Doppler imaging. Femoral Vein: No evidence of thrombus. Normal compressibility, respiratory phasicity and response to augmentation. Popliteal Vein: No evidence of thrombus. Normal compressibility, respiratory phasicity and response to augmentation. Calf Veins: No evidence of thrombus. Normal compressibility and flow  on color Doppler imaging. Other Findings:  None. LEFT LOWER EXTREMITY Common Femoral Vein: No evidence of thrombus. Normal compressibility, respiratory phasicity and response to augmentation. Saphenofemoral Junction: No  evidence of thrombus. Normal compressibility and flow on color Doppler imaging. Profunda Femoral Vein: No evidence of thrombus. Normal compressibility and flow on color Doppler imaging. Femoral Vein: No evidence of thrombus. Normal compressibility, respiratory phasicity and response to augmentation. Popliteal Vein: No evidence of thrombus. Normal compressibility, respiratory phasicity and response to augmentation. Calf Veins: No evidence of thrombus. Normal compressibility and flow on color Doppler imaging. Other Findings:  None. IMPRESSION: No evidence of bilateral lower extremity deep venous thrombosis. Ruthann Cancer, MD [facility] Electronically Signed   By: Ruthann Cancer MD   On: 09/18/2020 15:45     Medications:    sodium chloride      allopurinol  100 mg Oral q morning   amLODipine  10 mg Oral q morning   aspirin EC  325 mg Oral Daily   atorvastatin  40 mg Oral q morning   calcitRIOL  0.25 mcg Oral Daily   calcium carbonate  400 mg of elemental calcium Oral Q breakfast   Chlorhexidine Gluconate Cloth  6 each Topical Q0600   furosemide  80 mg Intravenous Q12H   heparin  5,000 Units Subcutaneous Q8H   isosorbide mononitrate  60 mg Oral q morning   metoprolol  200 mg Oral q morning   sodium bicarbonate  1,300 mg Oral BID   sodium chloride flush  3 mL Intravenous Q12H   sodium chloride, acetaminophen, heparin, lidocaine-prilocaine, ondansetron (ZOFRAN) IV, sodium chloride flush  Assessment/ Plan:  70 y.o. female with advanced chronic kidney disease, hypertension, osteoarthritis with history of left knee replacement 2021, gout, chronic hep C treated in 2020, cirrhosis, past history of alcohol use, LVH, diastolic dysfunction, current smoker, coronary disease with history of angioplasty admitted on 09/18/2020 for Acute diastolic CHF (congestive heart failure) ([place]) [I50.31]  #Chronic kidney disease stage V, now presents with uremia  and volume overload.  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 She has a functioning left arm AV fistula which has not been used Received first dialysis treatment today, tolerated well.  UF goal 1L achieved  Plan for dialysis tomorrow, UF goal 2L as tolerated Dialysis coordinator has begun outpatient center placement.  Agree with moderate dose of IV Lasix for volume control May be able to restart ACE inhibitor dialysis is initiated  #Hyperkalemia Will improve with diuretics and dialysis Will monitor am labs  #Anemia of chronic kidney disease Lab Results  Component Value Date   HGB 10.0 (L) 09/18/2020    #Secondary hyperparathyroidism of renal origin Lab Results  Component Value Date   CALCIUM 7.7 (L) 09/19/2020  Continue calcitriol Calcium supplements with breakfast     LOS: [place] 8/8/20223:45 PM  [facility] [place], [place]

## 2020-09-19 NOTE — Progress Notes (Addendum)
PT Cancellation Note  Patient Details Name: Anna Key MRN: [id number] DOB: 1950/09/07   Cancelled Treatment:    Reason Eval/Treat Not Completed: Medical issues which prohibited therapy: Pt's K 5.5 and trending up falling outside guidelines for participation with PT services. Will attempt to see pt at a future date/time as medically appropriate.   Linus Salmons PT, DPT 09/19/20, 3:50 PM

## 2020-09-19 NOTE — Progress Notes (Signed)
PROGRESS NOTE    [name]  YHC:[id number] DOB: [birth year] DOA: 09/18/2020 PCP: Kirk Ruths, MD    Brief Narrative:   Anna Key is a 70 y.o. female with medical history significant for stage V chronic kidney disease, chronic diastolic dysfunction CHF, hypertension and dyslipidemia who presents to the ER for evaluation of shortness of breath and lower extremity swelling which has progressively worsened over the last couple of weeks.  Patient states she is unable to ambulate due to swelling in her legs. She states that she is supposed to be on torsemide but has not received the medication in the mail.  Chest x-ray shows cardiomegaly, central pulmonary vascular congestion and new small bilateral pleural effusions. BNP 1588  8/8 had dialysis today.  Still short of breath.  On oxygen currently 4 L satting 90%..  Consultants:  Nephrology  Procedures: Hemodialysis  Antimicrobials:      Subjective: Still short of breath.  No chest pain.  No coughing  Objective: Vitals:   09/19/20 0500 09/19/20 0600 09/19/20 0700 09/19/20 0800  BP: 134/73 131/75 117/68 114/76  Pulse:      Resp:  20  17  Temp:      TempSrc:      SpO2:      Weight:      Height:        Intake/Output Summary (Last 24 hours) at 09/19/2020 0836 Last data filed at 09/19/2020 0345 Gross per 24 hour  Intake --  Output 800 ml  Net -800 ml   Filed Weights   09/18/20 0737 09/18/20 1958 09/18/20 2223  Weight: 89.8 kg 99.6 kg 99.6 kg    Examination:  General exam: Appears calm and comfortable  Respiratory system: Decreased breath sounds at bases, scattered crackles no wheezing  cardiovascular system: S1 & S2 heard, RRR. +JVD,no gallop Gastrointestinal system: Abdomen is nondistended, soft and nontenderNormal bowel sounds heard. Central nervous system: Alert and oriented.  Grossly intact extremities: +2 edema b/l Psychiatry: Mood & affect appropriate.     Data Reviewed: I have personally  reviewed following labs and imaging studies  CBC: Recent Labs  Lab 09/18/20 0746  WBC 6.8  HGB 10.0*  HCT 32.9*  MCV 96.2  PLT 517   Basic Metabolic Panel: Recent Labs  Lab 09/18/20 0746 09/19/20 0530  NA 140 143  K 5.4* 5.5*  CL 108 110  CO2 23 28  GLUCOSE 91 142*  BUN 59* 64*  CREATININE 6.05* 6.41*  CALCIUM 7.6* 7.7*   GFR: Estimated Creatinine Clearance: 9.5 mL/min (A) (by C-G formula based on SCr of 6.41 mg/dL (H)). Liver Function Tests: No results for input(s): AST, ALT, ALKPHOS, BILITOT, PROT, ALBUMIN in the last 168 hours. No results for input(s): LIPASE, AMYLASE in the last 168 hours. No results for input(s): AMMONIA in the last 168 hours. Coagulation Profile: No results for input(s): INR, PROTIME in the last 168 hours. Cardiac Enzymes: No results for input(s): CKTOTAL, CKMB, CKMBINDEX, TROPONINI in the last 168 hours. BNP (last 3 results) No results for input(s): PROBNP in the last 8760 hours. HbA1C: No results for input(s): HGBA1C in the last 72 hours. CBG: No results for input(s): GLUCAP in the last 168 hours. Lipid Profile: No results for input(s): CHOL, HDL, LDLCALC, TRIG, CHOLHDL, LDLDIRECT in the last 72 hours. Thyroid Function Tests: No results for input(s): TSH, T4TOTAL, FREET4, T3FREE, THYROIDAB in the last 72 hours. Anemia Panel: No results for input(s): VITAMINB12, FOLATE, FERRITIN, TIBC, IRON, RETICCTPCT in the last  72 hours. Sepsis Labs: No results for input(s): PROCALCITON, LATICACIDVEN in the last 168 hours.  Recent Results (from the past 240 hour(s))  Resp Panel by RT-PCR (Flu A&B, Covid) Nasopharyngeal Swab     Status: None   Collection Time: 09/18/20 12:19 PM   Specimen: Nasopharyngeal Swab; Nasopharyngeal(NP) swabs in vial transport medium  Result Value Ref Range Status   SARS Coronavirus 2 by RT PCR NEGATIVE NEGATIVE Final    Comment: (NOTE) SARS-CoV-2 target nucleic acids are NOT DETECTED.  The SARS-CoV-2 RNA is generally  detectable in upper respiratory specimens during the acute phase of infection. The lowest concentration of SARS-CoV-2 viral copies this assay can detect is 138 copies/mL. A negative result does not preclude SARS-Cov-2 infection and should not be used as the sole basis for treatment or other patient management decisions. A negative result may occur with  improper specimen collection/handling, submission of specimen other than nasopharyngeal swab, presence of viral mutation(s) within the areas targeted by this assay, and inadequate number of viral copies(<138 copies/mL). A negative result must be combined with clinical observations, patient history, and epidemiological information. The expected result is Negative.  Fact Sheet for Patients:  EntrepreneurPulse.com.au  Fact Sheet for Healthcare Providers:  IncredibleEmployment.be  This test is no t yet approved or cleared by the [place] FDA and  has been authorized for detection and/or diagnosis of SARS-CoV-2 by FDA under an Emergency Use Authorization (EUA). This EUA will remain  in effect (meaning this test can be used) for the duration of the COVID-19 declaration under Section 564(b)(1) of the Act, 21 U.S.C.section 360bbb-3(b)(1), unless the authorization is terminated  or revoked sooner.       Influenza A by PCR NEGATIVE NEGATIVE Final   Influenza B by PCR NEGATIVE NEGATIVE Final    Comment: (NOTE) The Xpert Xpress SARS-CoV-2/FLU/RSV plus assay is intended as an aid in the diagnosis of influenza from Nasopharyngeal swab specimens and should not be used as a sole basis for treatment. Nasal washings and aspirates are unacceptable for Xpert Xpress SARS-CoV-2/FLU/RSV testing.  Fact Sheet for Patients: EntrepreneurPulse.com.au  Fact Sheet for Healthcare Providers: IncredibleEmployment.be  This test is not yet approved or cleared by the [place] FDA  and has been authorized for detection and/or diagnosis of SARS-CoV-2 by FDA under an Emergency Use Authorization (EUA). This EUA will remain in effect (meaning this test can be used) for the duration of the COVID-19 declaration under Section 564(b)(1) of the Act, 21 U.S.C. section 360bbb-3(b)(1), unless the authorization is terminated or revoked.  Performed at [facility], [address] [place], [place] [address]          Radiology Studies: DG Chest 2 View  Result Date: 09/18/2020 CLINICAL DATA:  Shortness of breath. EXAM: CHEST - 2 VIEW COMPARISON:  May 29, 2017. FINDINGS: Enlarged cardiac silhouette. Central pulmonary vascular congestion. Small bilateral pleural effusions, new. Overlying bibasilar opacities. No visible pleural effusions or pneumothorax. No acute osseous abnormality. Degenerative changes of the spine. IMPRESSION: 1. Cardiomegaly, central pulmonary vascular congestion, and new small bilateral pleural effusions. 2. Overlying bibasilar opacities, which could represent atelectasis, aspiration, and/or pneumonia. Electronically Signed   By: Margaretha Sheffield MD   On: 09/18/2020 08:22   US Venous Img Lower Bilateral (DVT)  Result Date: 09/18/2020 CLINICAL DATA:  70 year old female with leg swelling for 1 month. EXAM: BILATERAL LOWER EXTREMITY VENOUS DOPPLER ULTRASOUND TECHNIQUE: Gray-scale sonography with graded compression, as well as color Doppler and duplex ultrasound were performed to evaluate the  lower extremity deep venous systems from the level of the common femoral vein and including the common femoral, femoral, profunda femoral, popliteal and calf veins including the posterior tibial, peroneal and gastrocnemius veins when visible. The superficial great saphenous vein was also interrogated. Spectral Doppler was utilized to evaluate flow at rest and with distal augmentation maneuvers in the common femoral, femoral and popliteal veins. COMPARISON:  None.  FINDINGS: RIGHT LOWER EXTREMITY Common Femoral Vein: No evidence of thrombus. Normal compressibility, respiratory phasicity and response to augmentation. Saphenofemoral Junction: No evidence of thrombus. Normal compressibility and flow on color Doppler imaging. Profunda Femoral Vein: No evidence of thrombus. Normal compressibility and flow on color Doppler imaging. Femoral Vein: No evidence of thrombus. Normal compressibility, respiratory phasicity and response to augmentation. Popliteal Vein: No evidence of thrombus. Normal compressibility, respiratory phasicity and response to augmentation. Calf Veins: No evidence of thrombus. Normal compressibility and flow on color Doppler imaging. Other Findings:  None. LEFT LOWER EXTREMITY Common Femoral Vein: No evidence of thrombus. Normal compressibility, respiratory phasicity and response to augmentation. Saphenofemoral Junction: No evidence of thrombus. Normal compressibility and flow on color Doppler imaging. Profunda Femoral Vein: No evidence of thrombus. Normal compressibility and flow on color Doppler imaging. Femoral Vein: No evidence of thrombus. Normal compressibility, respiratory phasicity and response to augmentation. Popliteal Vein: No evidence of thrombus. Normal compressibility, respiratory phasicity and response to augmentation. Calf Veins: No evidence of thrombus. Normal compressibility and flow on color Doppler imaging. Other Findings:  None. IMPRESSION: No evidence of bilateral lower extremity deep venous thrombosis. Ruthann Cancer, MD [facility] Electronically Signed   By: Ruthann Cancer MD   On: 09/18/2020 15:45        Scheduled Meds:  allopurinol  100 mg Oral q morning   amLODipine  10 mg Oral q morning   aspirin EC  325 mg Oral Daily   atorvastatin  40 mg Oral q morning   calcitRIOL  0.25 mcg Oral Daily   calcium carbonate  400 mg of elemental calcium Oral Q breakfast   Chlorhexidine  Gluconate Cloth  6 each Topical Q0600   furosemide  80 mg Intravenous Q12H   heparin  5,000 Units Subcutaneous Q8H   isosorbide mononitrate  60 mg Oral q morning   metoprolol  200 mg Oral q morning   sodium bicarbonate  1,300 mg Oral BID   sodium chloride flush  3 mL Intravenous Q12H   Continuous Infusions:  sodium chloride      Assessment & Plan:   Principal Problem:   Acute diastolic CHF (congestive heart failure) (HCC) Active Problems:   Essential hypertension   CAD (coronary artery disease)   Tobacco use   Chronic kidney disease (CKD), stage V (HCC)   Anemia in chronic kidney disease   Cigarette smoker   Hyperkalemia   Acute diastolic dysfunction CHF Patient's last 2D echocardiogram from 2018 shows an LVEF of 60 to 65% with grade 2 diastolic dysfunction CHF. 8/8 likely due to noncompliance with her diuretics and her CKD plus CHF  had dialysis today.  Still volume overloaded Continue IV Lasix Status post dialysis today          Stage V chronic kidney disease With hyperkalemia and acute CHF Nephrology following had dialysis today Plan for dialysis Tuesday and Wednesday and looking for outpatient dialysis center       Hypertension Holding lisinopril due to hyperkalemia  Once initiating dialysis as above will resume if cleared by nephrology  Continue amlodipine, metoprolol, nitrate          Nicotine dependence Patient declined nicotine patch  Smoking counseling cessation was done during her hospitalization            Anemia of chronic kidney disease H&H stable         Hyperkalemia Secondary to ACE inhibitor use as well as chronic kidney disease 8/8 hold lisinopril Monitor level   DVT prophylaxis: Heparin Code Status: Full Family Communication: None at bedside Disposition Plan:  Status is: Inpatient  Remains inpatient appropriate because:Inpatient level of care appropriate due to severity of illness  Dispo: The patient is from: Home               Anticipated d/c is to: Home              Patient currently is not medically stable to d/c.   Difficult to place patient No            LOS: 1 day   Time spent: 45 minutes with more than 50% on [place], MD [facility] Pager [phone]  If 7PM-7AM, please contact night-coverage 09/19/2020, 8:36 AM

## 2020-09-19 NOTE — ED Notes (Signed)
RN aware of bed assignment 

## 2020-09-19 NOTE — Progress Notes (Signed)
Hemodialysis treatment initiated at 9:03AM, patient was cannulated successfully for the first time using 17g needles. Wet stick was preformed, dialysis consents were signed.   Uf goal is 1.5kg and patient will run for 2 hours. Patient will be monitored closely.

## 2020-09-19 NOTE — Progress Notes (Signed)
   09/19/20 1100 09/19/20 1107  Vitals  BP (!) 141/74 134/74  MAP (mmHg) 93 73  Pulse Rate 75  --   ECG Heart Rate 73  --   During Hemodialysis Assessment  Blood Flow Rate (mL/min) 200 mL/min  --   Arterial Pressure (mmHg) -140 mmHg  --   Venous Pressure (mmHg) 170 mmHg  --   Transmembrane Pressure (mmHg) 50 mmHg  --   Ultrafiltration Rate (mL/min) 750 mL/min  --   Dialysate Flow Rate (mL/min) 300 ml/min  --   Conductivity: Machine  13.9  --   HD Safety Checks Performed Yes  --   Intra-Hemodialysis Comments Progressing as prescribed Tx completed

## 2020-09-19 NOTE — Progress Notes (Signed)
09/19/20 0900 09/19/20 0903 09/19/20 0915  Vitals  Temp  --  97.7 F (36.5 C)  --   Temp Source  --  Oral  --   BP  --  128/68 132/79  MAP (mmHg)  --  89 94  BP Location  --  Left Arm  --   BP Method  --  Automatic  --   Patient Position (if appropriate)  --  Lying  --   Pulse Rate  --  72 71  ECG Heart Rate  --   --  62  Resp  --  18  --   During Hemodialysis Assessment  Blood Flow Rate (mL/min) 200 mL/min  --  200 mL/min  Arterial Pressure (mmHg) -140 mmHg  --  -140 mmHg  Venous Pressure (mmHg) 170 mmHg  --  170 mmHg  Transmembrane Pressure (mmHg) 50 mmHg  --  50 mmHg  Ultrafiltration Rate (mL/min) 750 mL/min  --  750 mL/min  Dialysate Flow Rate (mL/min) 300 ml/min  --  300 ml/min  Conductivity: Machine  13.9  --  13.9  HD Safety Checks Performed Yes  --  Yes  Intra-Hemodialysis Comments Tx initiated  --  Progressing as prescribed    09/19/20 0930 09/19/20 0945 09/19/20 1000  Vitals  Temp  --   --   --   Temp Source  --   --   --   BP 132/76 128/73 128/76  MAP (mmHg) 93 89 90  BP Location  --   --   --   BP Method  --   --   --   Patient Position (if appropriate)  --   --   --   Pulse Rate 73 73 75  ECG Heart Rate 72 72 73  Resp  --   --   --   During Hemodialysis Assessment  Blood Flow Rate (mL/min) 200 mL/min 200 mL/min 200 mL/min  Arterial Pressure (mmHg) -140 mmHg -140 mmHg -140 mmHg  Venous Pressure (mmHg) 170 mmHg 170 mmHg 170 mmHg  Transmembrane Pressure (mmHg) 50 mmHg 50 mmHg 50 mmHg  Ultrafiltration Rate (mL/min) 750 mL/min 750 mL/min 750 mL/min  Dialysate Flow Rate (mL/min) 300 ml/min 300 ml/min 300 ml/min  Conductivity: Machine  13.9 13.9 13.9  HD Safety Checks Performed Yes Yes Yes  Intra-Hemodialysis Comments Progressing as prescribed Progressing as prescribed Progressing as prescribed    09/19/20 1015 09/19/20 1030 09/19/20 1045  Vitals  Temp  --   --   --   Temp Source  --   --   --   BP 117/72 137/75 (!) 143/73  MAP (mmHg)  --  93 93  BP  Location  --   --   --   BP Method  --   --   --   Patient Position (if appropriate)  --   --   --   Pulse Rate 75 75 75  ECG Heart Rate 75 74 76  Resp  --   --   --   During Hemodialysis Assessment  Blood Flow Rate (mL/min) 200 mL/min 200 mL/min 200 mL/min  Arterial Pressure (mmHg) -140 mmHg -140 mmHg -140 mmHg  Venous Pressure (mmHg) 170 mmHg 170 mmHg 170 mmHg  Transmembrane Pressure (mmHg) 50 mmHg 50 mmHg 50 mmHg  Ultrafiltration Rate (mL/min) 750 mL/min 750 mL/min 750 mL/min  Dialysate Flow Rate (mL/min) 300 ml/min 300 ml/min 300 ml/min  Conductivity: Machine  13.9 13.9 13.9  HD Safety Checks Performed Yes Yes  Yes  Intra-Hemodialysis Comments Progressing as prescribed Progressing as prescribed Progressing as prescribed

## 2020-09-19 NOTE — Progress Notes (Signed)
OT Cancellation Note  Patient Details Name: Anna Key MRN: [id number] DOB: 11/28/1950   Cancelled Treatment:    Reason Eval/Treat Not Completed: Medical issues which prohibited therapy. Consult received, chart reviewed. Pt noted with elevated K+ 5.5. Per therapy guidelines, will hold OT evaluation and re-attempt at later date/time as medically appropriate.   Hanley Hays, MPH, MS, OTR/L ascom [phone] 09/19/20, 2:41 PM

## 2020-09-19 NOTE — Progress Notes (Signed)
Notified today about patient requiring outpatient hemodialysis placement. Met with patient, education provided and center chosen. Referral being sent to [facility]. Please contact me with any dialysis placement concerns.  Elvera Bicker Dialysis Coordinator [phone]

## 2020-09-20 ENCOUNTER — Inpatient Hospital Stay
Admission: EM | Disposition: A | Discharge status: Home Health | Payer: Self-pay | Source: Home / Self Care | Attending: Internal Medicine

## 2020-09-20 ENCOUNTER — Inpatient Hospital Stay (HOSPITAL_COMMUNITY)
Admit: 2020-09-20 | Discharge: 2020-09-20 | Disposition: A | Discharge status: Home / Self Care | Payer: Medicare HMO | Attending: Internal Medicine | Admitting: Internal Medicine

## 2020-09-20 ENCOUNTER — Other Ambulatory Visit (INDEPENDENT_AMBULATORY_CARE_PROVIDER_SITE_OTHER): Payer: Self-pay | Admitting: Vascular Surgery

## 2020-09-20 DIAGNOSIS — I5031 Acute diastolic (congestive) heart failure: Secondary | ICD-10-CM

## 2020-09-20 DIAGNOSIS — N179 Acute kidney failure, unspecified: Secondary | ICD-10-CM

## 2020-09-20 HISTORY — PX: TEMPORARY DIALYSIS CATHETER: CATH118312

## 2020-09-20 LAB — BLOOD GAS, ARTERIAL
Acid-Base Excess: 3.7 mmol/L — ABNORMAL HIGH (ref 0.0–2.0)
Acid-base deficit: 0.6 mmol/L (ref 0.0–2.0)
Allens test (pass/fail): POSITIVE — AB
Bicarbonate: 27.4 mmol/L (ref 20.0–28.0)
Bicarbonate: 31 mmol/L — ABNORMAL HIGH (ref 20.0–28.0)
FIO2: 0.36
FIO2: 40
O2 Saturation: 86.2 %
O2 Saturation: 89.6 %
Patient temperature: 37
Patient temperature: 37
pCO2 arterial: 64 mmHg — ABNORMAL HIGH (ref 32.0–48.0)
pCO2 arterial: 63 mmHg — ABNORMAL HIGH (ref 32.0–48.0)
pH, Arterial: 7.24 — ABNORMAL LOW (ref 7.350–7.450)
pH, Arterial: 7.3 — ABNORMAL LOW (ref 7.350–7.450)
pO2, Arterial: 61 mmHg — ABNORMAL LOW (ref 83.0–108.0)
pO2, Arterial: 64 mmHg — ABNORMAL LOW (ref 83.0–108.0)

## 2020-09-20 LAB — BASIC METABOLIC PANEL
Anion gap: 10 (ref 5–15)
BUN: 49 mg/dL — ABNORMAL HIGH (ref 8–23)
CO2: 25 mmol/L (ref 22–32)
Calcium: 7.6 mg/dL — ABNORMAL LOW (ref 8.9–10.3)
Chloride: 104 mmol/L (ref 98–111)
Creatinine, Ser: 5.88 mg/dL — ABNORMAL HIGH (ref 0.44–1.00)
GFR, Estimated: 7 mL/min — ABNORMAL LOW (ref >60)
Glucose, Bld: 115 mg/dL — ABNORMAL HIGH (ref 70–99)
Potassium: 5.4 mmol/L — ABNORMAL HIGH (ref 3.5–5.1)
Sodium: 139 mmol/L (ref 135–145)

## 2020-09-20 LAB — ECHOCARDIOGRAM COMPLETE
AR max vel: 2.51 cm2
AV Area VTI: 3.24 cm2
AV Area mean vel: 2.68 cm2
AV Mean grad: 13 mmHg
AV Peak grad: 25.4 mmHg
Ao pk vel: 2.52 m/s
Area-P 1/2: 2.9 cm2
Height: 64 in
MV VTI: 3.07 cm2
S' Lateral: 2.8 cm
Weight: 3480 oz

## 2020-09-20 LAB — HEPATITIS B SURFACE ANTIBODY, QUANTITATIVE: Hep B S AB Quant (Post): <3.1 m[IU]/mL — ABNORMAL LOW (ref >9.9)

## 2020-09-20 LAB — TROPONIN I (HIGH SENSITIVITY): Troponin I (High Sensitivity): 21 ng/L — ABNORMAL HIGH (ref <18)

## 2020-09-20 LAB — MRSA NEXT GEN BY PCR, NASAL: MRSA by PCR Next Gen: NOT DETECTED

## 2020-09-20 LAB — GLUCOSE, CAPILLARY
Glucose-Capillary: 123 mg/dL — ABNORMAL HIGH (ref 70–99)
Glucose-Capillary: 178 mg/dL — ABNORMAL HIGH (ref 70–99)

## 2020-09-20 SURGERY — TEMPORARY DIALYSIS CATHETER
Anesthesia: LOCAL

## 2020-09-20 MED ORDER — HALOPERIDOL LACTATE 5 MG/ML IJ SOLN
0.5000 mg | INTRAMUSCULAR | Status: DC | PRN
  Administered 2020-09-20 – 2020-09-23 (×2): 0.5 mg via INTRAVENOUS
  Filled 2020-09-20 (×3): qty 1

## 2020-09-20 MED ORDER — CLINDAMYCIN PHOSPHATE 300 MG/50ML IV SOLN
300.0000 mg | INTRAVENOUS | Status: AC
  Filled 2020-09-20 (×2): qty 50

## 2020-09-20 MED ORDER — HALOPERIDOL LACTATE 5 MG/ML IJ SOLN: 0.5000 mg | Freq: Four times a day (QID) | INTRAMUSCULAR | Status: DC | PRN

## 2020-09-20 MED ORDER — PERFLUTREN LIPID MICROSPHERE
1.0000 mL | INTRAVENOUS | Status: AC | PRN
  Administered 2020-09-20: 2 mL via INTRAVENOUS
  Filled 2020-09-20: qty 10

## 2020-09-20 MED ORDER — HEPARIN SODIUM (PORCINE) 1000 UNIT/ML IJ SOLN
INTRAMUSCULAR | Status: AC
  Filled 2020-09-20: qty 1

## 2020-09-20 SURGICAL SUPPLY — 2 items
GUIDEWIRE AMPLATZ SHORT (WIRE) ×3 IMPLANT
KIT CATH DIALYSIS TRI 24X13 (CATHETERS) ×3 IMPLANT

## 2020-09-20 NOTE — Progress Notes (Signed)
*  PRELIMINARY RESULTS* Echocardiogram 2D Echocardiogram has been performed.  Anna Key 09/20/2020, 11:46 AM

## 2020-09-20 NOTE — Progress Notes (Addendum)
[place], [place] 09/20/20  Subjective:   Hospital day # 2  Patient known to our practice from outpatient follow-up.  She has history of advanced CKD, diastolic CHF, hypertension and dyslipidemia.  She presents to the emergency room for worsening shortness of breath and lower extremity edema for the past 2 weeks or so.    Patient seen and evaluated at bedside Drowsy Taken to HD but high venous pressures and blood oozing at site prompted termination of treatment Patient states she feels fine Denies shortness of breath   Patient seen at later time prior to procedure Extremely drowsy Dr Candiss Norse called to bedside and patient able to follow commands, but unwilling to answer most questions. O2 88-91% Falling asleep mid sentence.    Objective:  Vital signs in last 24 hours:  Temp:  [97.8 F (36.6 C)-98.2 F (36.8 C)] 97.9 F (36.6 C) (08/09 1019) Pulse Rate:  [65-74] 67 (08/09 1019) Resp:  [17-26] 17 (08/09 1019) BP: (117-140)/(67-90) 131/79 (08/09 1019) SpO2:  [91 %-98 %] 93 % (08/09 1019) Weight:  [98.7 kg] 98.7 kg (08/09 0330)  Weight change: 8.845 kg Filed Weights   09/18/20 1958 09/18/20 2223 09/20/20 0330  Weight: 99.6 kg 99.6 kg 98.7 kg    Intake/Output:    Intake/Output Summary (Last 24 hours) at 09/20/2020 1242 Last data filed at 09/20/2020 0950 Gross per 24 hour  Intake 1200 ml  Output 300 ml  Net 900 ml     Physical Exam: General:  No acute distress, laying in the bed  HEENT  anicteric, moist oral mucous membrane  Pulm/lungs  normal breathing effort, b/l crackles  CVS/Heart  regular rhythm, no rub or gallop  Abdomen:   Soft, nontender  Extremities:  ++ peripheral edema  Neurologic:  Alert, oriented, able to follow commands  Skin:  No acute rashes    Basic Metabolic Panel:  Recent Labs  Lab 09/18/20 0746 09/19/20 0530 09/20/20 0624  NA 140 143 139  K 5.4* 5.5* 5.4*  CL 108 110 104  CO2 23 28 25   GLUCOSE 91 142* 115*   BUN 59* 64* 49*  CREATININE 6.05* 6.41* 5.88*  CALCIUM 7.6* 7.7* 7.6*      CBC: Recent Labs  Lab 09/18/20 0746  WBC 6.8  HGB 10.0*  HCT 32.9*  MCV 96.2  PLT 181       Lab Results  Component Value Date   HEPBSAG NON REACTIVE 09/18/2020   HEPBSAG NON REACTIVE 09/18/2020   HEPBSAB NON REACTIVE 09/18/2020      Microbiology:  Recent Results (from the past 240 hour(s))  Resp Panel by RT-PCR (Flu A&B, Covid) Nasopharyngeal Swab     Status: None   Collection Time: 09/18/20 12:19 PM   Specimen: Nasopharyngeal Swab; Nasopharyngeal(NP) swabs in vial transport medium  Result Value Ref Range Status   SARS Coronavirus 2 by RT PCR NEGATIVE NEGATIVE Final    Comment: (NOTE) SARS-CoV-2 target nucleic acids are NOT DETECTED.  The SARS-CoV-2 RNA is generally detectable in upper respiratory specimens during the acute phase of infection. The lowest concentration of SARS-CoV-2 viral copies this assay can detect is 138 copies/mL. A negative result does not preclude SARS-Cov-2 infection and should not be used as the sole basis for treatment or other patient management decisions. A negative result may occur with  improper specimen collection/handling, submission of specimen other than nasopharyngeal swab, presence of viral mutation(s) within the areas targeted by this assay, and inadequate number of viral copies(<138 copies/mL).  A negative result must be combined with clinical observations, patient history, and epidemiological information. The expected result is Negative.  Fact Sheet for Patients:  EntrepreneurPulse.com.au  Fact Sheet for Healthcare Providers:  IncredibleEmployment.be  This test is no t yet approved or cleared by the [place] FDA and  has been authorized for detection and/or diagnosis of SARS-CoV-2 by FDA under an Emergency Use Authorization (EUA). This EUA will remain  in effect (meaning this test can be used) for the  duration of the COVID-19 declaration under Section 564(b)(1) of the Act, 21 U.S.C.section 360bbb-3(b)(1), unless the authorization is terminated  or revoked sooner.       Influenza A by PCR NEGATIVE NEGATIVE Final   Influenza B by PCR NEGATIVE NEGATIVE Final    Comment: (NOTE) The Xpert Xpress SARS-CoV-2/FLU/RSV plus assay is intended as an aid in the diagnosis of influenza from Nasopharyngeal swab specimens and should not be used as a sole basis for treatment. Nasal washings and aspirates are unacceptable for Xpert Xpress SARS-CoV-2/FLU/RSV testing.  Fact Sheet for Patients: EntrepreneurPulse.com.au  Fact Sheet for Healthcare Providers: IncredibleEmployment.be  This test is not yet approved or cleared by the [place] FDA and has been authorized for detection and/or diagnosis of SARS-CoV-2 by FDA under an Emergency Use Authorization (EUA). This EUA will remain in effect (meaning this test can be used) for the duration of the COVID-19 declaration under Section 564(b)(1) of the Act, 21 U.S.C. section 360bbb-3(b)(1), unless the authorization is terminated or revoked.  Performed at [facility], [address] [place], [place] [address]     Coagulation Studies: No results for input(s): LABPROT, INR in the last 72 hours.  Urinalysis: No results for input(s): COLORURINE, LABSPEC, PHURINE, GLUCOSEU, HGBUR, BILIRUBINUR, KETONESUR, PROTEINUR, UROBILINOGEN, NITRITE, LEUKOCYTESUR in the last 72 hours.  Invalid input(s): APPERANCEUR    Imaging: US Venous Img Lower Bilateral (DVT)  Result Date: 09/18/2020 CLINICAL DATA:  70 year old female with leg swelling for 1 month. EXAM: BILATERAL LOWER EXTREMITY VENOUS DOPPLER ULTRASOUND TECHNIQUE: Gray-scale sonography with graded compression, as well as color Doppler and duplex ultrasound were performed to evaluate the lower extremity deep venous systems from the level of the common femoral  vein and including the common femoral, femoral, profunda femoral, popliteal and calf veins including the posterior tibial, peroneal and gastrocnemius veins when visible. The superficial great saphenous vein was also interrogated. Spectral Doppler was utilized to evaluate flow at rest and with distal augmentation maneuvers in the common femoral, femoral and popliteal veins. COMPARISON:  None. FINDINGS: RIGHT LOWER EXTREMITY Common Femoral Vein: No evidence of thrombus. Normal compressibility, respiratory phasicity and response to augmentation. Saphenofemoral Junction: No evidence of thrombus. Normal compressibility and flow on color Doppler imaging. Profunda Femoral Vein: No evidence of thrombus. Normal compressibility and flow on color Doppler imaging. Femoral Vein: No evidence of thrombus. Normal compressibility, respiratory phasicity and response to augmentation. Popliteal Vein: No evidence of thrombus. Normal compressibility, respiratory phasicity and response to augmentation. Calf Veins: No evidence of thrombus. Normal compressibility and flow on color Doppler imaging. Other Findings:  None. LEFT LOWER EXTREMITY Common Femoral Vein: No evidence of thrombus. Normal compressibility, respiratory phasicity and response to augmentation. Saphenofemoral Junction: No evidence of thrombus. Normal compressibility and flow on color Doppler imaging. Profunda Femoral Vein: No evidence of thrombus. Normal compressibility and flow on color Doppler imaging. Femoral Vein: No evidence of thrombus. Normal compressibility, respiratory phasicity and response to augmentation. Popliteal Vein: No evidence of thrombus. Normal compressibility, respiratory phasicity and response  to augmentation. Calf Veins: No evidence of thrombus. Normal compressibility and flow on color Doppler imaging. Other Findings:  None. IMPRESSION: No evidence of bilateral lower extremity deep venous thrombosis. Ruthann Cancer, MD [facility] Electronically Signed   By: Ruthann Cancer MD   On: 09/18/2020 15:45     Medications:    sodium chloride     [START ON 09/21/2020] clindamycin (CLEOCIN) IV      allopurinol  100 mg Oral q morning   amLODipine  10 mg Oral q morning   aspirin EC  325 mg Oral Daily   atorvastatin  40 mg Oral q morning   calcitRIOL  0.25 mcg Oral Daily   calcium carbonate  400 mg of elemental calcium Oral Q breakfast   Chlorhexidine Gluconate Cloth  6 each Topical Q0600   furosemide  80 mg Intravenous Q12H   heparin  5,000 Units Subcutaneous Q8H   isosorbide mononitrate  60 mg Oral q morning   metoprolol  200 mg Oral q morning   sodium bicarbonate  1,300 mg Oral BID   sodium chloride flush  3 mL Intravenous Q12H   sodium chloride, acetaminophen, heparin, lidocaine-prilocaine, ondansetron (ZOFRAN) IV, sodium chloride flush  Assessment/ Plan:  70 y.o. female with advanced chronic kidney disease, hypertension, osteoarthritis with history of left knee replacement 2021, gout, chronic hep C treated in 2020, cirrhosis, past history of alcohol use, LVH, diastolic dysfunction, current smoker, coronary disease with history of angioplasty admitted on 09/18/2020 for Acute pulmonary edema ([place]) [J81.0] DVT (deep venous thrombosis) ([place]) [I82.409] Acute respiratory failure with hypoxia ([place]) [M35.36] Acute diastolic CHF (congestive heart failure) ([place]) [I50.31] Stage 5 chronic kidney disease not on chronic dialysis ([place]) [N18.5]  #Chronic kidney disease stage V, now presents with uremia and volume overload.  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 She has a functioning left arm AV fistula which has not been used Dialysis initiated on 09/19/20. Received first dialysis treatment yesterday, tolerated well.  UF goal 1L achieved  Dialysis attempted today but terminated after a few minutes due to high venus pressures. Discussed with vascular and they will perform a  fistulogram this afternoon. Patient NPO for procedure.  Plan to dialyze tomorrow.  Dialysis coordinator has confirmed outpatient clinic at [facility] with a start date of 09/22/20.  IV Lasix 80mg  BID for volume control May be able to restart ACE inhibitor dialysis is initiated *addendum-Fistulogram will be performed tomorrow due to confusion today. Vascular to place a temp cath and patient will receive dialysis today. Due to confusion 2 physicians provided consent due inability to contact next of kin. Due to Bipap, dialysis will be performed at bedside.   #Hyperkalemia 5.4 today Will improve with diuretics and dialysis May require veltassa, since no dialysis completed today.  #Anemia of chronic kidney disease Lab Results  Component Value Date   HGB 10.0 (L) 09/18/2020  Hgb at goal   #Secondary hyperparathyroidism of renal origin Lab Results  Component Value Date   CALCIUM 7.6 (L) 09/20/2020  Continue calcitriol Calcium supplements with breakfast  #Hypoxia believed related to sleep apnea ABG ordered  Bipap applied   LOS: [address] 8/9/202212:42 [place], [place]

## 2020-09-20 NOTE — Progress Notes (Signed)
Pt arrived for dialysis, sleepy but awakens easily, some periorbital edema was noted as welll as generalized. Pt is a bit disoriented, Access arm was noted with some bruising on arrival, a bit tender but + bruit, + thrill, BP a bit soft on arrival at 117/75 ( 83 map). Some trouble with access cannulation due to previous infiltration and tenderness. Will monitor.

## 2020-09-20 NOTE — Progress Notes (Signed)
We were unable to run this patient today although tx initiation was attempted by [facility] but stopped due to elevated venous pressures. Access site was already tender and bruised on arrival pre HD but initial venous needle was encountered with signs of clotting. A second needle was placed above the initial venous needle and was successful but venous pressure was significantly elevated at a pump speed of 85 and with 17 gauge needles. Unable to run today, Dr. Candiss Norse was made aware and report given to care RN on unit. Vss on discharge back to unit.

## 2020-09-20 NOTE — Progress Notes (Signed)
PT Cancellation Note  Patient Details Name: Anna Key MRN: [id number] DOB: 11-Aug-1950   Cancelled Treatment:    Reason Eval/Treat Not Completed: Medical issues which prohibited therapy. Consult received and chart reviewed. Patient noted with critically elevated K+ (5.4) , per PT practice guidelines contraindicated for exertional activity at this time.  Will continue efforts next date pending medical stability and appropriateness.  Lieutenant Diego PT, DPT 10:13 AM,09/20/20

## 2020-09-20 NOTE — Progress Notes (Addendum)
This patient was dialyzed today - 2nd Hd tx, and 2nd attempt today. This Tx was dove via the newly placed non tunneled right femoral vascath. Pre HD, pt was lethargic with eyes closed and only withdrawing to pain and tactile stimulation without opening eyes and on Bipap, Dr Candiss Norse was made aware. About halfway through this HD treatment the patient awakened and was a bit combative, confused and pulling connections from her body. Unit care RN and Dr. Candiss Norse were made aware promptly. Care team came to room to assist. Shortly afterwards, patient calmed down but remained disoriented x2.  2.5 hours of Hd was completed as ordered with a net UF of 1L. Vital signs were stable post HD. Handoff given to care RN post Hd. See flowsheet.

## 2020-09-20 NOTE — Progress Notes (Signed)
Patient ID: Anna Key, female   DOB: 1950-06-10, 70 y.o.   MRN: [id number]  Was called by nursing in holding special procedure room as pt was going to have HD cath placed. However was found to be confused. Pt seen and examined. Dr. Candiss Norse and PA by bedside too. Pt opens eyes and responds, but appears to be confused. Tried to do neuro exam, and no focal deficits was seen. ABG was completed, and CO2 elevated. RT to place bipap. Plan is to get HD later today. Husband was unable to be reached.

## 2020-09-20 NOTE — Progress Notes (Signed)
PROGRESS NOTE    Anna Key  YKD:[id number] DOB: [birth year] DOA: 09/18/2020 PCP: Kirk Ruths, MD    Brief Narrative:   Anna Key is a 70 y.o. female with medical history significant for stage V chronic kidney disease, chronic diastolic dysfunction CHF, hypertension and dyslipidemia who presents to the ER for evaluation of shortness of breath and lower extremity swelling which has progressively worsened over the last couple of weeks.  Patient states she is unable to ambulate due to swelling in her legs. She states that she is supposed to be on torsemide but has not received the medication in the mail.  Chest x-ray shows cardiomegaly, central pulmonary vascular congestion and new small bilateral pleural effusions. BNP 1588  Was started on HD on 8/8  8/9-had HD today. She is sleepy post HD. Still with sob. No cp  Consultants:  Nephrology  Procedures: Hemodialysis  Antimicrobials:      Subjective: Denies coughing, dizziness,  Objective: Vitals:   09/19/20 1400 09/19/20 1716 09/20/20 0330 09/20/20 0739  BP: 135/77 140/76 131/69 127/67  Pulse:   65 67  Resp: 19 19 17 18   Temp:  98.1 F (36.7 C) 97.8 F (36.6 C) 98.2 F (36.8 C)  TempSrc:  Oral  Oral  SpO2: 96% 93% 91% 98%  Weight:   98.7 kg   Height:        Intake/Output Summary (Last 24 hours) at 09/20/2020 0844 Last data filed at 09/20/2020 0500 Gross per 24 hour  Intake 960 ml  Output 1300 ml  Net -340 ml   Filed Weights   09/18/20 1958 09/18/20 2223 09/20/20 0330  Weight: 99.6 kg 99.6 kg 98.7 kg    Examination:  Sleepy, NAD Decreased breath sounds at bases no wheezing Regular S1-S2 no gallops, positive JVD Soft benign positive bowel sounds Positive edema lower extremity Mood and affect appropriate in current setting   Data Reviewed: I have personally reviewed following labs and imaging studies  CBC: Recent Labs  Lab 09/18/20 0746  WBC 6.8  HGB 10.0*  HCT 32.9*  MCV 96.2   PLT 397   Basic Metabolic Panel: Recent Labs  Lab 09/18/20 0746 09/19/20 0530 09/20/20 0624  NA 140 143 139  K 5.4* 5.5* 5.4*  CL 108 110 104  CO2 23 28 25   GLUCOSE 91 142* 115*  BUN 59* 64* 49*  CREATININE 6.05* 6.41* 5.88*  CALCIUM 7.6* 7.7* 7.6*   GFR: Estimated Creatinine Clearance: 10.3 mL/min (A) (by C-G formula based on SCr of 5.88 mg/dL (H)). Liver Function Tests: No results for input(s): AST, ALT, ALKPHOS, BILITOT, PROT, ALBUMIN in the last 168 hours. No results for input(s): LIPASE, AMYLASE in the last 168 hours. No results for input(s): AMMONIA in the last 168 hours. Coagulation Profile: No results for input(s): INR, PROTIME in the last 168 hours. Cardiac Enzymes: No results for input(s): CKTOTAL, CKMB, CKMBINDEX, TROPONINI in the last 168 hours. BNP (last 3 results) No results for input(s): PROBNP in the last 8760 hours. HbA1C: No results for input(s): HGBA1C in the last 72 hours. CBG: No results for input(s): GLUCAP in the last 168 hours. Lipid Profile: No results for input(s): CHOL, HDL, LDLCALC, TRIG, CHOLHDL, LDLDIRECT in the last 72 hours. Thyroid Function Tests: No results for input(s): TSH, T4TOTAL, FREET4, T3FREE, THYROIDAB in the last 72 hours. Anemia Panel: No results for input(s): VITAMINB12, FOLATE, FERRITIN, TIBC, IRON, RETICCTPCT in the last 72 hours. Sepsis Labs: No results for input(s): PROCALCITON, LATICACIDVEN in the  last 168 hours.  Recent Results (from the past 240 hour(s))  Resp Panel by RT-PCR (Flu A&B, Covid) Nasopharyngeal Swab     Status: None   Collection Time: 09/18/20 12:19 PM   Specimen: Nasopharyngeal Swab; Nasopharyngeal(NP) swabs in vial transport medium  Result Value Ref Range Status   SARS Coronavirus 2 by RT PCR NEGATIVE NEGATIVE Final    Comment: (NOTE) SARS-CoV-2 target nucleic acids are NOT DETECTED.  The SARS-CoV-2 RNA is generally detectable in upper respiratory specimens during the acute phase of infection. The  lowest concentration of SARS-CoV-2 viral copies this assay can detect is 138 copies/mL. A negative result does not preclude SARS-Cov-2 infection and should not be used as the sole basis for treatment or other patient management decisions. A negative result may occur with  improper specimen collection/handling, submission of specimen other than nasopharyngeal swab, presence of viral mutation(s) within the areas targeted by this assay, and inadequate number of viral copies(<138 copies/mL). A negative result must be combined with clinical observations, patient history, and epidemiological information. The expected result is Negative.  Fact Sheet for Patients:  EntrepreneurPulse.com.au  Fact Sheet for Healthcare Providers:  IncredibleEmployment.be  This test is no t yet approved or cleared by the [place] FDA and  has been authorized for detection and/or diagnosis of SARS-CoV-2 by FDA under an Emergency Use Authorization (EUA). This EUA will remain  in effect (meaning this test can be used) for the duration of the COVID-19 declaration under Section 564(b)(1) of the Act, 21 U.S.C.section 360bbb-3(b)(1), unless the authorization is terminated  or revoked sooner.       Influenza A by PCR NEGATIVE NEGATIVE Final   Influenza B by PCR NEGATIVE NEGATIVE Final    Comment: (NOTE) The Xpert Xpress SARS-CoV-2/FLU/RSV plus assay is intended as an aid in the diagnosis of influenza from Nasopharyngeal swab specimens and should not be used as a sole basis for treatment. Nasal washings and aspirates are unacceptable for Xpert Xpress SARS-CoV-2/FLU/RSV testing.  Fact Sheet for Patients: EntrepreneurPulse.com.au  Fact Sheet for Healthcare Providers: IncredibleEmployment.be  This test is not yet approved or cleared by the [place] FDA and has been authorized for detection and/or diagnosis of SARS-CoV-2 by FDA under  an Emergency Use Authorization (EUA). This EUA will remain in effect (meaning this test can be used) for the duration of the COVID-19 declaration under Section 564(b)(1) of the Act, 21 U.S.C. section 360bbb-3(b)(1), unless the authorization is terminated or revoked.  Performed at [facility], [address] [place], [place] [address]          Radiology Studies: US Venous Img Lower Bilateral (DVT)  Result Date: 09/18/2020 CLINICAL DATA:  70 year old female with leg swelling for 1 month. EXAM: BILATERAL LOWER EXTREMITY VENOUS DOPPLER ULTRASOUND TECHNIQUE: Gray-scale sonography with graded compression, as well as color Doppler and duplex ultrasound were performed to evaluate the lower extremity deep venous systems from the level of the common femoral vein and including the common femoral, femoral, profunda femoral, popliteal and calf veins including the posterior tibial, peroneal and gastrocnemius veins when visible. The superficial great saphenous vein was also interrogated. Spectral Doppler was utilized to evaluate flow at rest and with distal augmentation maneuvers in the common femoral, femoral and popliteal veins. COMPARISON:  None. FINDINGS: RIGHT LOWER EXTREMITY Common Femoral Vein: No evidence of thrombus. Normal compressibility, respiratory phasicity and response to augmentation. Saphenofemoral Junction: No evidence of thrombus. Normal compressibility and flow on color Doppler imaging. Profunda Femoral Vein: No evidence of  thrombus. Normal compressibility and flow on color Doppler imaging. Femoral Vein: No evidence of thrombus. Normal compressibility, respiratory phasicity and response to augmentation. Popliteal Vein: No evidence of thrombus. Normal compressibility, respiratory phasicity and response to augmentation. Calf Veins: No evidence of thrombus. Normal compressibility and flow on color Doppler imaging. Other Findings:  None. LEFT LOWER EXTREMITY Common Femoral Vein: No  evidence of thrombus. Normal compressibility, respiratory phasicity and response to augmentation. Saphenofemoral Junction: No evidence of thrombus. Normal compressibility and flow on color Doppler imaging. Profunda Femoral Vein: No evidence of thrombus. Normal compressibility and flow on color Doppler imaging. Femoral Vein: No evidence of thrombus. Normal compressibility, respiratory phasicity and response to augmentation. Popliteal Vein: No evidence of thrombus. Normal compressibility, respiratory phasicity and response to augmentation. Calf Veins: No evidence of thrombus. Normal compressibility and flow on color Doppler imaging. Other Findings:  None. IMPRESSION: No evidence of bilateral lower extremity deep venous thrombosis. Ruthann Cancer, MD [facility] Electronically Signed   By: Ruthann Cancer MD   On: 09/18/2020 15:45        Scheduled Meds:  allopurinol  100 mg Oral q morning   amLODipine  10 mg Oral q morning   aspirin EC  325 mg Oral Daily   atorvastatin  40 mg Oral q morning   calcitRIOL  0.25 mcg Oral Daily   calcium carbonate  400 mg of elemental calcium Oral Q breakfast   Chlorhexidine Gluconate Cloth  6 each Topical Q0600   furosemide  80 mg Intravenous Q12H   heparin  5,000 Units Subcutaneous Q8H   isosorbide mononitrate  60 mg Oral q morning   metoprolol  200 mg Oral q morning   sodium bicarbonate  1,300 mg Oral BID   sodium chloride flush  3 mL Intravenous Q12H   Continuous Infusions:  sodium chloride      Assessment & Plan:   Principal Problem:   Acute diastolic CHF (congestive heart failure) (HCC) Active Problems:   Essential hypertension   CAD (coronary artery disease)   Tobacco use   Chronic kidney disease (CKD), stage V (HCC)   Anemia in chronic kidney disease   Cigarette smoker   Hyperkalemia   Acute diastolic dysfunction CHF Patient's last 2D echocardiogram from 2018 shows an LVEF of 60 to 65%  with grade 2 diastolic dysfunction CHF. 8/9 likely due to noncompliance with her diuretics and her CKD plus CHF  Her CHF is going to be managed with dialysis mostly  Dialysis initiated on 09/19/2020 Had dialysis today Continue IV Lasix as she is still volume overloaded  Continue nitrates and beta-blockers    Stage V chronic kidney disease With hyperkalemia and acute CHF Dialysis initiated on 8/8  Had dialysis today and plan to have dialysis tomorrow  Dialysis coordinator has come from outpatient clinic at [facility] with a start date of 09/22/2020 May be able to restart ACE inhibitor's with dialysis being initiated-we will discuss on timing with nephrology          Hypertension Holding lisinopril due to hyperkalemia -initiate when cleared by nephrology 8/9 continue amlodipine, metoprolol and nitrates          Nicotine dependence Smoking cessation counseling was done during her hospitalization Continue nicotine patch           Anemia of chronic kidney disease H&H stable         Hyperkalemia Secondary to ACE inhibitor use as well as chronic kidney disease hold lisinopril Monitor  level   DVT prophylaxis: Heparin Code Status: Full Family Communication: None at bedside Disposition Plan:  Status is: Inpatient  Remains inpatient appropriate because:Inpatient level of care appropriate due to severity of illness  Dispo: The patient is from: Home              Anticipated d/c is to: Home              Patient currently is not medically stable to d/c.   Difficult to place patient No    Disposition: Patient is having dialysis tomorrow and can start outpatient dialysis on 8/11        LOS: 2 days   Time spent: 35 minutes with more than 50% on [place], MD [facility] Pager [phone]  If 7PM-7AM, please contact night-coverage 09/20/2020, 8:44 AM

## 2020-09-20 NOTE — Progress Notes (Signed)
Pt arrived to [facility] for Fistulogram. Pt arrived confused and unable to sign consent pt family contacts unavailable. Pt received temp dialysis cath in R fem site.  Pt ABG also done and put on Bipap by respiratory. VSS. Pt R fem site is CDI. Pt returned to her room with purse belongings and earrings place in side pocket of purse. PT onm telemetry monitor.

## 2020-09-20 NOTE — Progress Notes (Signed)
OT Cancellation Note  Patient Details Name: Anna Key MRN: [id number] DOB: 1950/10/27   Cancelled Treatment:    Reason Eval/Treat Not Completed: Medical issues which prohibited therapy. Chart reviewed. Patient noted with continued critically elevated K+ (5.4) , per OT practice guidelines contraindicated for exertional activity at this time.  Will continue efforts next date pending medical stability and appropriateness.  Hanley Hays, MPH, MS, OTR/L ascom [phone] 09/20/20, 11:00 AM

## 2020-09-21 DIAGNOSIS — I5031 Acute diastolic (congestive) heart failure: Secondary | ICD-10-CM

## 2020-09-21 LAB — BASIC METABOLIC PANEL
Anion gap: 9 (ref 5–15)
BUN: 35 mg/dL — ABNORMAL HIGH (ref 8–23)
CO2: 28 mmol/L (ref 22–32)
Calcium: 7.5 mg/dL — ABNORMAL LOW (ref 8.9–10.3)
Chloride: 101 mmol/L (ref 98–111)
Creatinine, Ser: 4.55 mg/dL — ABNORMAL HIGH (ref 0.44–1.00)
GFR, Estimated: 10 mL/min — ABNORMAL LOW (ref >60)
Glucose, Bld: 78 mg/dL (ref 70–99)
Potassium: 4.7 mmol/L (ref 3.5–5.1)
Sodium: 138 mmol/L (ref 135–145)

## 2020-09-21 MED ORDER — TORSEMIDE 20 MG PO TABS
40.0000 mg | ORAL_TABLET | Freq: Every day | ORAL | Status: DC
  Administered 2020-09-22 – 2020-09-24 (×2): 40 mg via ORAL
  Filled 2020-09-21 (×2): qty 2

## 2020-09-21 NOTE — Consult Note (Signed)
   Heart Failure Nurse Navigator Note  HFpEF 70 to 75%.  Grade 2 diastolic dysfunction.  Abnormal longitudinal strain.  Normal right ventricular systolic function.  Severely elevated pulmonary artery systolic pressure.  Mild mitral regurgitation.  She presented to the emergency room with complaints of shortness of breath.  She had also noted lower extremity edema worsening over the last 2 weeks.  Been off her torsemide for several weeks.   Comorbidities:  Chronic kidney disease stage IV Hypertension Hyperlipidemia  Medications:  Norvasc 10 mg Aspirin 325 mg Atorvastatin 40 mg Furosemide 80 mg every 12 hours Isosorbide mononitrate 60 mg every morning Metoprolol succinate 200 mg every morning   Labs:  Sodium 138, potassium 4.7 chloride 101, CO2 28, BUN 35, creatinine 4.55 down from 5.88 of yesterday Intake 240 mL Output of 1000 mL   Assessment:  General-she is awake and alert sitting up at the bedside.  In no acute distress.  HEENT-pupils are equal, normocephalic.  Cardiac-heart tones of regular rate and rhythm with systolic murmur  Chest-breath sounds are diminished in the bases to posterior auscultation.  Abdomen-rounded soft non tender.  Lower extremities-edema noted.   Psych-is pleasant and appropriate  Neurologic-speech is clear moves all extremities without difficulty.    Initial meeting with the patient.  She states she lives at home with her husband in [place] and that she does not have any children.  She does not have a scale at home to weigh her self daily.  She admits to using salt on some of her foods at the table.  She states that she does not eat processed or convenience foods.  Does like fresh vegetables and fruits.  Asked for fluid intake, she states that she likes to drink cranberry juice or grape juice.  Discussed the importance of sticking with 64 ounces or less daily.  Discussed this with her nephrologist.  She was given living with  heart failure teaching booklet along with information on low-sodium and in the zone magnet.  Arzu Riffle RN CHFN

## 2020-09-21 NOTE — Progress Notes (Signed)
PROGRESS NOTE  Anna Key  DOB: Mar 01, 1950  PCP: Kirk Ruths, MD WYO:[id number]  DOA: 09/18/2020  LOS: 3 days  Hospital Day: 4   Chief Complaint  Patient presents with   Leg Swelling   Shortness of Breath    Brief narrative: Anna Key is a 70 y.o. female with PMH significant for HTN, HLD, CKD 5, chronic diastolic CHF, noncompliance to medications. Patient presented to the ED on 8/7 with complaint of shortness of breath and lower extremity swelling which has been progressively worsening for the last couple of weeks   In the ED, creatinine was elevated to 6.05, BNP was elevated to more than 1500. Chest x-ray showed cardiomegaly, central pulmonary vascular congestion and new small bilateral pleural effusions.  Patient was admitted to hospitalist service. Nephrology consultation was obtained. 8/9, temporary catheter was placed and patient was started on dialysis.  Subjective: Patient was seen and examined this afternoon.  Pleasant elderly African-American female.  Lying in bed.  Completed dialysis today.  Alert, awake, oriented x3.  Wants to go home.  Pending PT evaluation today  Assessment/Plan: Acute volume overload and uremia ESRD -Presented with shortness of breath, pedal edema and worsening CKD -Patient's CKD has progressed to ESRD. -Nephrology consultation was obtained. -8/9 temporary dialysis catheter was placed and patient was started on dialysis. -Currently patient is also on Lasix IV 80 mg twice daily. -Patient has a functioning left arm AV fistula which has not been used.  Noted a tentative plan for fistulogram tomorrow. -Noted outpatient dialysis clinic arrangement at [facility] in [facility].   Acute diastolic dysfunction CHF Essential hypertension -Echo from 2018 showed an EF 60 to 65% with grade 2 diastolic dysfunction. -Worsening of volume overload and CKD probably due to dietary indiscretion and noncompliance to diuretics. -Continue  nitrate, beta-blocker, amlodipine.  Okay to initiate ACE inhibitor per nephrology.  Hyperkalemia -Corrected after dialysis was initiated. -ACE inhibitor has been on hold.  Blood pressure remained stable. Recent Labs  Lab 09/18/20 0746 09/19/20 0530 09/20/20 0624 09/21/20 0547  K 5.4* 5.5* 5.4* 4.7   Nicotine dependence -Smoking cessation counseling was done during her hospitalization -Continue nicotine patch    Anemia of chronic disease -Hemoglobin stable. Recent Labs    11/12/19 1001 05/16/20 1329 08/26/20 1341 09/18/20 0746  HGB 9.7* 7.7* 8.9* 10.0*  MCV 87.7 84.2 88.6 96.2  VITAMINB12  --  393  --   --   FOLATE  --  14.3  --   --   FERRITIN  --  12 46  --   TIBC  --  448 286  --   IRON  --  25* 39  --   RETICCTPCT  --  3.4*  --   --     Mobility: PT eval pending Code Status:   Code Status: Full Code  Nutritional status: Body mass index is 36.78 kg/m.     Diet:  Diet Order             Diet renal with fluid restriction Fluid restriction: 1200 mL Fluid; Room service appropriate? Yes; Fluid consistency: Thin  Diet effective now                  DVT prophylaxis:  heparin injection 5,000 Units Start: 09/18/20 1400   Antimicrobials: None Fluid: None Consultants: Nephrology Family Communication: None at bedside  Status is: Inpatient.   Remains inpatient appropriate because: Needs completion of dialysis initiation, arrangement of outpatient chair  Dispo: The patient  is from: Home              Anticipated d/c is to: Home probably next 2 to 3 days              Patient currently is not medically stable to d/c.   Difficult to place patient No     Infusions:   sodium chloride     clindamycin (CLEOCIN) IV      Scheduled Meds:  allopurinol  100 mg Oral q morning   amLODipine  10 mg Oral q morning   aspirin EC  325 mg Oral Daily   atorvastatin  40 mg Oral q morning   calcitRIOL  0.25 mcg Oral Daily   calcium carbonate  400 mg of elemental calcium  Oral Q breakfast   Chlorhexidine Gluconate Cloth  6 each Topical Q0600   furosemide  80 mg Intravenous Q12H   heparin  5,000 Units Subcutaneous Q8H   isosorbide mononitrate  60 mg Oral q morning   metoprolol  200 mg Oral q morning   sodium bicarbonate  1,300 mg Oral BID   sodium chloride flush  3 mL Intravenous Q12H    Antimicrobials: Anti-infectives (From admission, onward)    Start     Dose/Rate Route Frequency Ordered Stop   09/21/20 0600  clindamycin (CLEOCIN) IVPB 300 mg        300 mg 100 mL/hr over 30 Minutes Intravenous On call to O.R. 09/20/20 1137 09/22/20 0559       PRN meds: sodium chloride, acetaminophen, haloperidol lactate, lidocaine-prilocaine, ondansetron (ZOFRAN) IV, sodium chloride flush   Objective: Vitals:   09/21/20 1334 09/21/20 1413  BP: (P) 111/71 123/74  Pulse: (P) 72 77  Resp: (P) 18 18  Temp:    SpO2: (P) 100% 100%    Intake/Output Summary (Last 24 hours) at 09/21/2020 1503 Last data filed at 09/21/2020 1334 Gross per 24 hour  Intake 240 ml  Output 3000 ml  Net -2760 ml   Filed Weights   09/18/20 2223 09/20/20 0330 09/21/20 0623  Weight: 99.6 kg 98.7 kg 97.2 kg   Weight change: -1.452 kg Body mass index is 36.78 kg/m.   Physical Exam: General exam: Pleasant, elderly African-American female.  Not in distress Skin: No rashes, lesions or ulcers. HEENT: Atraumatic, normocephalic, no obvious bleeding Lungs: Clear to auscultation bilaterally CVS: Regular rate and rhythm, no murmur GI/Abd soft, nontender, nondistended, bowel sound present CNS: Alert, awake oriented x3 Psychiatry: Mood appropriate Extremities: Pedal edema 1+ bilaterally, no calf tenderness  Data Review: I have personally reviewed the laboratory data and studies available.  Recent Labs  Lab 09/18/20 0746  WBC 6.8  HGB 10.0*  HCT 32.9*  MCV 96.2  PLT 181   Recent Labs  Lab 09/18/20 0746 09/19/20 0530 09/20/20 0624 09/21/20 0547  NA 140 143 139 138  K 5.4*  5.5* 5.4* 4.7  CL 108 110 104 101  CO2 23 28 25 28   GLUCOSE 91 142* 115* 78  BUN 59* 64* 49* 35*  CREATININE 6.05* 6.41* 5.88* 4.55*  CALCIUM 7.6* 7.7* 7.6* 7.5*    F/u labs ordered Unresulted Labs (From admission, onward)     Start     Ordered   09/22/20 6387  Basic metabolic panel  Daily,   R      09/21/20 1503   09/22/20 0500  CBC with Differential/Platelet  Daily,   R      09/21/20 1503   09/19/20 0550  QuantiFERON-TB Gold Plus  Once,   R        09/19/20 0550            Signed, Terrilee Croak, MD [facility] 09/21/2020

## 2020-09-21 NOTE — Care Management Important Message (Signed)
Important Message  Patient Details  Name: NOELL LORENSEN MRN: [id number] Date of Birth: Nov 04, 1950   Medicare Important Message Given:  Yes  Out of room for dialysis at time of visit.  Copy of Medicare IM left on patient's tray for reference.   Dannette Barbara 09/21/2020, 12:28 PM

## 2020-09-21 NOTE — Evaluation (Signed)
Physical Therapy Evaluation Patient Details Name: Anna Key MRN: [id number] DOB: Jan 02, 1951 Today's Date: 09/21/2020   History of Present Illness  Pt is a 70 y.o. F presenting to ED for SOB and LE edema leading to an inability to amb. Pt has PMH for stage V CKD, chronic diastolic dysfunction, HTN, CAD (angioplasty 09/2020), L TKA, R PKA   Clinical Impression  Pt alert, Aox3, in bed with breakfast arriving beginning of treatment. Evaluation was limited due to femoral HD cath placement, per PT guidelines no OOB activity with limited hip flexion. Pt notes independence with all ADLs, and IADLs, and does not utilize an AD at baseline. Pt adds her spouse is able to provide assistance if necessary while residing at home.  Pt demonstrates at least 3/5 MMT strength, being able to move against gravity for major muscles. Therapeutic exercises performed at bedside to maintain ROM and mobility while in bed. PT will continue to evaluate OOB mobility and function pending restrictions. Current discharge recommendations are home due to family support and PLOF being independent at baseline. PT services will be updated pending further evaluation    Follow Up Recommendations Other (comment);Outpatient PT (TBD with further PT evaluation)    Equipment Recommendations  None recommended by PT    Recommendations for Other Services       Precautions / Restrictions Precautions Precautions: Fall Restrictions Weight Bearing Restrictions: No Other Position/Activity Restrictions: R LE femoral HD placement - per PT guidelines restricted hip flexion and no OOB mobility      Mobility  Bed Mobility               General bed mobility comments: Not specifically tested due to R LE femoral HD placement restricting hip flexion and OOB mobility.    Transfers                 General transfer comment: Not specifically tested due to R LE femoral HD placement restricting hip flexion and OOB  mobility.  Ambulation/Gait                Stairs            Wheelchair Mobility    Modified Rankin (Stroke Patients Only)       Balance Overall balance assessment:  (Pt remained in bed throughout treatment, balance not specifically tested.)                                           Pertinent Vitals/Pain Pain Assessment: Faces Faces Pain Scale: Hurts a little bit Pain Location: R thigh Pain Descriptors / Indicators: Grimacing;Discomfort Pain Intervention(s): Limited activity within patient's tolerance;Monitored during session;Repositioned    Home Living Family/patient expects to be discharged to:: Private residence Living Arrangements: Spouse/significant other Available Help at Discharge: Family Type of Home: Apartment Home Access: Level entry     Home Layout: One level Home Equipment: Shower seat - built in      Prior Function Level of Independence: Independent         Comments: Pt ambulates independently without an AD and does not require assitance with ADLs or IADLs     Hand Dominance        Extremity/Trunk Assessment   Upper Extremity Assessment Upper Extremity Assessment: Overall WFL for tasks assessed (Pt is able to raise arms to 90 and reach across body)    Lower Extremity Assessment Lower  Extremity Assessment: Overall WFL for tasks assessed (Pt is able to abd/add, dorsiflex/plantar BLE.)       Communication   Communication: No difficulties  Cognition Arousal/Alertness: Awake/alert Behavior During Therapy: WFL for tasks assessed/performed Overall Cognitive Status: Within Functional Limits for tasks assessed                                 General Comments: AOx3      General Comments      Exercises General Exercises - Lower Extremity Ankle Circles/Pumps: AROM;10 reps;Both;Supine Quad Sets: AROM;Both;10 reps;Supine Hip ABduction/ADduction: AROM;Both;10 reps;Supine   Assessment/Plan    PT  Assessment Patient needs continued PT services  PT Problem List Decreased strength;Decreased range of motion;Decreased activity tolerance;Decreased balance;Decreased mobility       PT Treatment Interventions Gait training;Stair training;Functional mobility training;Therapeutic activities;Therapeutic exercise;Balance training;Neuromuscular re-education    PT Goals (Current goals can be found in the Care Plan section)  Acute Rehab PT Goals Patient Stated Goal: To go home PT Goal Formulation: With patient Time For Goal Achievement: 10/05/20 Potential to Achieve Goals: Good    Frequency Min 2X/week   Barriers to discharge        Co-evaluation               AM-PAC PT "6 Clicks" Mobility  Outcome Measure Help needed turning from your back to your side while in a flat bed without using bedrails?: A Little Help needed moving from lying on your back to sitting on the side of a flat bed without using bedrails?: A Little Help needed moving to and from a bed to a chair (including a wheelchair)?: A Little Help needed standing up from a chair using your arms (e.g., wheelchair or bedside chair)?: A Little Help needed to walk in hospital room?: A Little Help needed climbing 3-5 steps with a railing? : A Lot 6 Click Score: 17    End of Session Equipment Utilized During Treatment:  (Evaluation performed at bedside) Activity Tolerance: Patient tolerated treatment well Patient left: in bed;with call bell/phone within reach;with bed alarm set;with nursing/sitter in room   PT Visit Diagnosis: Muscle weakness (generalized) (M62.81)    Time: 1833-5825 PT Time Calculation (min) (ACUTE ONLY): 12 min   Charges:              [employer], SPT

## 2020-09-21 NOTE — Evaluation (Signed)
Occupational Therapy Evaluation Patient Details Name: Anna Key MRN: [id number] DOB: September 06, 1950 Today's Date: 09/21/2020    History of Present Illness Pt is a 70 y.o. F presenting to ED for SOB and LE edema leading to an inability to amb. Pt has PMH for stage V CKD, chronic diastolic dysfunction, HTN, CAD (angioplasty 09/2020), L TKA, R PKA   Clinical Impression   Pt seen for OT evaluation this date in setting of acute hospitalization d/t acute diastolic CHF. Pt is new HD patient and OT attempts to educate pt on safety considerations given current temporary femoral catheter as pt is noted to be sitting EOB when OT presents to room. Pt declines to lay back down which is relayed to RN who okays pt sitting for dinner. Pt reports being INDEP at baseline and presents this date with some decreased fxl activity tolerance. On ADL assessment this date, pt requires SETUP for seated UB ADLs, MOD A for LB ADLs in part d/t limited R hip ROM 2/2 precautions and in part d/t some decreased activity tolerance. Again, pt requires reminders not to overly flex the R hip and appears somewhat impulsive or to potentially have decreased insight into safety. Will continue to follow patient acutely and attempt to expand on evaluation/assessment once pt's temporary catheter is removed. RN did report seeing pt walking in room at an earlier date/time. At this time, recommend HHOT f/u for safety in the natural environment as pt appears to have decreased safety awareness impacting ADLs/ADL mobility.     Follow Up Recommendations  Home health OT    Equipment Recommendations  3 in 1 bedside commode;Tub/shower seat    Recommendations for Other Services       Precautions / Restrictions Precautions Precautions: Fall Restrictions Weight Bearing Restrictions: No Other Position/Activity Restrictions: R LE femoral HD placement; restricted hip flexion and no OOB mobility      Mobility Bed Mobility                General bed mobility comments: while pt is not technically supposed to sit with hip in flexion, she is sitting EOB when OT presents to room. Pt demos ability to get into/out of bed with MOD I-use of bed rails    Transfers                 General transfer comment: deferred d/t R LE temporary femoral catheter for HD.    Balance                                           ADL either performed or assessed with clinical judgement   ADL                                         General ADL Comments: requires SETUP for seated UB ADLs, MOD A for LB ADLs in part d/t limited R hip ROM 2/2 precautions and in part d/t some decreased activity tolerance.     Vision Patient Visual Report: No change from baseline       Perception     Praxis      Pertinent Vitals/Pain Pain Assessment: Faces Faces Pain Scale: No hurt Pain Location: R thigh     Hand Dominance Right   Extremity/Trunk Assessment Upper Extremity Assessment Upper  Extremity Assessment: Overall WFL for tasks assessed   Lower Extremity Assessment Lower Extremity Assessment: Overall WFL for tasks assessed (limited R hip assessment given that pt has R fem cath currenty)       Communication Communication Communication: No difficulties   Cognition Arousal/Alertness: Awake/alert Behavior During Therapy: WFL for tasks assessed/performed Overall Cognitive Status: No family/caregiver present to determine baseline cognitive functioning                                 General Comments: Pt is oriented to self, place and some aspects of situation, but not all aspects of time. While pt is appropriate with questions/commands, she is noted to be somewhat confused about dialysis and her femoral catheter. In addition, pt noted to have taken nasal cannula off and her RN reports she has taken the pulse ox monitor off today as well.   General Comments  G static sitting balance while  sitting EOB, again was in this position when OT presented despite being educated on importance of reducing R hip flexion d/t temporary catheter    Exercises Other Exercises Other Exercises: OT ed re: role of OT in acute setting. Pt appears to understand, but is also noted to have some confusion/limited carryover re; safety considerations d/t R fem cath.   Shoulder Instructions      Home Living Family/patient expects to be discharged to:: Private residence Living Arrangements: Spouse/significant other Available Help at Discharge: Family Type of Home: Apartment Home Access: Level entry     Home Layout: One level     Bathroom Shower/Tub: [employer]: Shower seat - built in          Prior Functioning/Environment Level of Independence: Independent        Comments: Pt ambulates independently without an AD and does not require assitance with ADLs or IADLs        OT Problem List: Decreased strength;Decreased activity tolerance;Impaired balance (sitting and/or standing);Decreased safety awareness      OT Treatment/Interventions: Self-care/ADL training;Therapeutic activities;Therapeutic exercise    OT Goals(Current goals can be found in the care plan section) Acute Rehab OT Goals Patient Stated Goal: To go home OT Goal Formulation: With patient Time For Goal Achievement: 10/05/20 Potential to Achieve Goals: Good ADL Goals Pt Will Perform Lower Body Dressing: with modified independence Pt Will Transfer to Toilet: with modified independence Pt Will Perform Toileting - Clothing Manipulation and hygiene: with modified independence  OT Frequency: Min 1X/week   Barriers to D/C:            Co-evaluation              AM-PAC OT "6 Clicks" Daily Activity     Outcome Measure Help from another person eating meals?: None Help from another person taking care of personal grooming?: None Help from another person toileting, which includes using  toliet, bedpan, or urinal?: A Little Help from another person bathing (including washing, rinsing, drying)?: A Little Help from another person to put on and taking off regular upper body clothing?: None Help from another person to put on and taking off regular lower body clothing?: A Lot 6 Click Score: 20   End of Session Nurse Communication: Other (comment) (notified that pt sitting EOB of her own accord and does not appear to have bed alarm set when OT presents. RN okays leaving bed alarm off while  pt sits to eat her dinner.)  Activity Tolerance: Patient tolerated treatment well Patient left: in bed;with call bell/phone within reach  OT Visit Diagnosis: Unsteadiness on feet (R26.81);Muscle weakness (generalized) (M62.81)                Time: 4353-9122 OT Time Calculation (min): 13 min Charges:  OT General Charges $OT Visit: 1 Visit OT Evaluation $OT Eval Low Complexity: [place], [place], OTR/L ascom [phone] 09/21/20, 6:11 PM

## 2020-09-21 NOTE — Progress Notes (Signed)
Patient tolerated 2.5 hours of HD treatment via R femoral CVC.  UF goal of 2L achieved.  No c/o offered.

## 2020-09-21 NOTE — Progress Notes (Addendum)
[place], [place] 09/21/20  Subjective:   Hospital day # 3  Patient known to our practice from outpatient follow-up.  She has history of advanced CKD, diastolic CHF, hypertension and dyslipidemia.  She presents to the emergency room for worsening shortness of breath and lower extremity edema for the past 2 weeks or so.    Patient seen laying in bed Alert Remembers some events from yesterday afternoon Denies shortness of breath Patient would like to go home  Concerned about her husband whom she cares for  Patient seen later during dialysis   HEMODIALYSIS FLOWSHEET:  Blood Flow Rate (mL/min): 250 mL/min Arterial Pressure (mmHg): -80 mmHg Venous Pressure (mmHg): 80 mmHg Transmembrane Pressure (mmHg): 60 mmHg Ultrafiltration Rate (mL/min): 830 mL/min Dialysate Flow Rate (mL/min): 500 ml/min Conductivity: Machine : 13.6 Conductivity: Machine : 13.6 Dialysis Fluid Bolus: Normal Saline Bolus Amount (mL): 200 mL    Objective:  Vital signs in last 24 hours:  Temp:  [98 F (36.7 C)-98.7 F (37.1 C)] 98 F (36.7 C) (08/10 1104) Pulse Rate:  [61-74] 70 (08/10 1230) Resp:  [14-30] 20 (08/10 1230) BP: (103-143)/(62-83) 115/73 (08/10 1230) SpO2:  [90 %-100 %] 100 % (08/10 1200) FiO2 (%):  [40 %] 40 % (08/09 1955) Weight:  [97.2 kg] 97.2 kg (08/10 0623)  Weight change: -1.452 kg Filed Weights   09/18/20 2223 09/20/20 0330 09/21/20 0623  Weight: 99.6 kg 98.7 kg 97.2 kg    Intake/Output:    Intake/Output Summary (Last 24 hours) at 09/21/2020 1240 Last data filed at 09/21/2020 1017 Gross per 24 hour  Intake 240 ml  Output 1000 ml  Net -760 ml     Physical Exam: General:  No acute distress, laying in the bed  HEENT  anicteric, moist oral mucous membrane  Pulm/lungs  normal breathing effort, b/l crackles (improving)  CVS/Heart  regular rhythm, no rub or gallop  Abdomen:   Soft, nontender  Extremities:  ++ peripheral edema  Neurologic:   Alert, able to follow commands  Skin:  No acute rashes    Basic Metabolic Panel:  Recent Labs  Lab 09/18/20 0746 09/19/20 0530 09/20/20 0624 09/21/20 0547  NA 140 143 139 138  K 5.4* 5.5* 5.4* 4.7  CL 108 110 104 101  CO2 23 28 25 28   GLUCOSE 91 142* 115* 78  BUN 59* 64* 49* 35*  CREATININE 6.05* 6.41* 5.88* 4.55*  CALCIUM 7.6* 7.7* 7.6* 7.5*      CBC: Recent Labs  Lab 09/18/20 0746  WBC 6.8  HGB 10.0*  HCT 32.9*  MCV 96.2  PLT 181       Lab Results  Component Value Date   HEPBSAG NON REACTIVE 09/18/2020   HEPBSAG NON REACTIVE 09/18/2020   HEPBSAB NON REACTIVE 09/18/2020      Microbiology:  Recent Results (from the past 240 hour(s))  Resp Panel by RT-PCR (Flu A&B, Covid) Nasopharyngeal Swab     Status: None   Collection Time: 09/18/20 12:19 PM   Specimen: Nasopharyngeal Swab; Nasopharyngeal(NP) swabs in vial transport medium  Result Value Ref Range Status   SARS Coronavirus 2 by RT PCR NEGATIVE NEGATIVE Final    Comment: (NOTE) SARS-CoV-2 target nucleic acids are NOT DETECTED.  The SARS-CoV-2 RNA is generally detectable in upper respiratory specimens during the acute phase of infection. The lowest concentration of SARS-CoV-2 viral copies this assay can detect is 138 copies/mL. A negative result does not preclude SARS-Cov-2 infection and should not be used as the  sole basis for treatment or other patient management decisions. A negative result may occur with  improper specimen collection/handling, submission of specimen other than nasopharyngeal swab, presence of viral mutation(s) within the areas targeted by this assay, and inadequate number of viral copies(<138 copies/mL). A negative result must be combined with clinical observations, patient history, and epidemiological information. The expected result is Negative.  Fact Sheet for Patients:  EntrepreneurPulse.com.au  Fact Sheet for Healthcare Providers:   IncredibleEmployment.be  This test is no t yet approved or cleared by the [place] FDA and  has been authorized for detection and/or diagnosis of SARS-CoV-2 by FDA under an Emergency Use Authorization (EUA). This EUA will remain  in effect (meaning this test can be used) for the duration of the COVID-19 declaration under Section 564(b)(1) of the Act, 21 U.S.C.section 360bbb-3(b)(1), unless the authorization is terminated  or revoked sooner.       Influenza A by PCR NEGATIVE NEGATIVE Final   Influenza B by PCR NEGATIVE NEGATIVE Final    Comment: (NOTE) The Xpert Xpress SARS-CoV-2/FLU/RSV plus assay is intended as an aid in the diagnosis of influenza from Nasopharyngeal swab specimens and should not be used as a sole basis for treatment. Nasal washings and aspirates are unacceptable for Xpert Xpress SARS-CoV-2/FLU/RSV testing.  Fact Sheet for Patients: EntrepreneurPulse.com.au  Fact Sheet for Healthcare Providers: IncredibleEmployment.be  This test is not yet approved or cleared by the [place] FDA and has been authorized for detection and/or diagnosis of SARS-CoV-2 by FDA under an Emergency Use Authorization (EUA). This EUA will remain in effect (meaning this test can be used) for the duration of the COVID-19 declaration under Section 564(b)(1) of the Act, 21 U.S.C. section 360bbb-3(b)(1), unless the authorization is terminated or revoked.  Performed at [facility], [address] [place], [place] [address]   MRSA Next Gen by PCR, Nasal     Status: None   Collection Time: 09/20/20  9:32 PM   Specimen: Nasal Mucosa; Nasal Swab  Result Value Ref Range Status   MRSA by PCR Next Gen NOT DETECTED NOT DETECTED Final    Comment: (NOTE) The GeneXpert MRSA Assay (FDA approved for NASAL specimens only), is one component of a comprehensive MRSA colonization surveillance program. It is not intended to  diagnose MRSA infection nor to guide or monitor treatment for MRSA infections. Test performance is not FDA approved in patients less than 7 years old. Performed at [facility], [address] [place], [place] [address]     Coagulation Studies: No results for input(s): LABPROT, INR in the last 72 hours.  Urinalysis: No results for input(s): COLORURINE, LABSPEC, PHURINE, GLUCOSEU, HGBUR, BILIRUBINUR, KETONESUR, PROTEINUR, UROBILINOGEN, NITRITE, LEUKOCYTESUR in the last 72 hours.  Invalid input(s): APPERANCEUR    Imaging: PERIPHERAL VASCULAR CATHETERIZATION  Result Date: 09/20/2020 See surgical note for result.  ECHOCARDIOGRAM COMPLETE  Result Date: 09/20/2020    ECHOCARDIOGRAM REPORT   Patient Name:   NAFEESAH LAPAGLIA Mayorquin Date of Exam: 09/20/2020 Medical Rec #:  [id number]          Height:       64.0 in Accession #:    [id number]         Weight:       217.5 lb Date of Birth:  Feb 03, 1951          BSA:          2.027 m Patient Age:    70 years  BP:           131/79 mmHg Patient Gender: F                  HR:           67 bpm. Exam Location:  [facility] Procedure: 2D Echo, Color Doppler, Cardiac Doppler, Strain Analysis and            Intracardiac Opacification Agent Indications:     I50.31 congestive heart failure-Acute Diastolic  History:         Patient has no prior history of Echocardiogram examinations.                  CHF, CKD; Risk Factors:Hypertension and Dyslipidemia. History                  of hepatitis C.  Sonographer:     Charmayne Sheer RDCS (AE) Referring Phys:  [id number] Collier Bullock Diagnosing Phys: Kate Sable MD  Sonographer Comments: Suboptimal parasternal window. Image acquisition challenging due to patient body habitus. Global longitudinal strain was attempted. IMPRESSIONS  1. Left ventricular ejection fraction, by estimation, is 70 to 75%. The left ventricle has hyperdynamic function. The left ventricle has no regional wall motion abnormalities. There is mild left  ventricular hypertrophy. Left ventricular diastolic parameters are consistent with Grade II diastolic dysfunction (pseudonormalization). The average left ventricular global longitudinal strain is -17.6 %. The global longitudinal strain is normal.  2. Right ventricular systolic function is normal. The right ventricular size is normal. There is severely elevated pulmonary artery systolic pressure. The estimated right ventricular systolic pressure is 75.1 mmHg.  3. The mitral valve is degenerative. Mild mitral valve regurgitation.  4. The aortic valve was not well visualized. Aortic valve regurgitation is not visualized.  5. The inferior vena cava is dilated in size with <50% respiratory variability, suggesting right atrial pressure of 15 mmHg. FINDINGS  Left Ventricle: Left ventricular ejection fraction, by estimation, is 70 to 75%. The left ventricle has hyperdynamic function. The left ventricle has no regional wall motion abnormalities. Definity contrast agent was given IV to delineate the left ventricular endocardial borders. The average left ventricular global longitudinal strain is -17.6 %. The global longitudinal strain is normal. The left ventricular internal cavity size was normal in size. There is mild left ventricular hypertrophy. Left ventricular diastolic parameters are consistent with Grade II diastolic dysfunction (pseudonormalization). Right Ventricle: The right ventricular size is normal. No increase in right ventricular wall thickness. Right ventricular systolic function is normal. There is severely elevated pulmonary artery systolic pressure. The tricuspid regurgitant velocity is 3.41 m/s, and with an assumed right atrial pressure of 15 mmHg, the estimated right ventricular systolic pressure is 70.0 mmHg. Left Atrium: Left atrial size was normal in size. Right Atrium: Right atrial size was normal in size. Pericardium: There is no evidence of pericardial effusion. Mitral Valve: The mitral valve is  degenerative in appearance. Mild mitral valve regurgitation. MV peak gradient, 5.7 mmHg. The mean mitral valve gradient is 3.0 mmHg. Tricuspid Valve: The tricuspid valve is grossly normal. Tricuspid valve regurgitation is trivial. Aortic Valve: The aortic valve was not well visualized. Aortic valve regurgitation is not visualized. Aortic valve mean gradient measures 13.0 mmHg. Aortic valve peak gradient measures 25.4 mmHg. Aortic valve area, by VTI measures 3.24 cm. Pulmonic Valve: The pulmonic valve was normal in structure. Pulmonic valve regurgitation is mild. Aorta: The aortic root is normal in size and structure. Venous: The inferior vena cava  is dilated in size with less than 50% respiratory variability, suggesting right atrial pressure of 15 mmHg. IAS/Shunts: No atrial level shunt detected by color flow Doppler.  LEFT VENTRICLE PLAX 2D LVIDd:         4.80 cm  Diastology LVIDs:         2.80 cm  LV e' medial:    9.68 cm/s LV PW:         1.20 cm  LV E/e' medial:  13.5 LV IVS:        1.00 cm  LV e' lateral:   6.42 cm/s LVOT diam:     2.00 cm  LV E/e' lateral: 20.4 LV SV:         130 LV SV Index:   64       2D Longitudinal Strain LVOT Area:     3.14 cm 2D Strain GLS Avg:     -17.6 %  RIGHT VENTRICLE RV Basal diam:  3.40 cm LEFT ATRIUM             Index       RIGHT ATRIUM           Index LA diam:        3.50 cm 1.73 cm/m  RA Area:     13.50 cm LA Vol (A2C):   83.1 ml 40.99 ml/m RA Volume:   29.10 ml  14.35 ml/m LA Vol (A4C):   61.8 ml 30.48 ml/m LA Biplane Vol: 74.0 ml 36.50 ml/m  AORTIC VALVE                    PULMONIC VALVE AV Area (Vmax):    2.51 cm     PV Vmax:       1.79 m/s AV Area (Vmean):   2.68 cm     PV Vmean:      114.000 cm/s AV Area (VTI):     3.24 cm     PV VTI:        0.373 m AV Vmax:           252.00 cm/s  PV Peak grad:  12.8 mmHg AV Vmean:          165.000 cm/s PV Mean grad:  6.0 mmHg AV VTI:            0.401 m AV Peak Grad:      25.4 mmHg AV Mean Grad:      13.0 mmHg LVOT Vmax:          201.00 cm/s LVOT Vmean:        141.000 cm/s LVOT VTI:          0.414 m LVOT/AV VTI ratio: 1.03  AORTA Ao Root diam: 3.00 cm MITRAL VALVE                TRICUSPID VALVE MV Area (PHT): 2.90 cm     TR Peak grad:   46.5 mmHg MV Area VTI:   3.07 cm     TR Vmax:        341.00 cm/s MV Peak grad:  5.7 mmHg MV Mean grad:  3.0 mmHg     SHUNTS MV Vmax:       1.19 m/s     Systemic VTI:  0.41 m MV Vmean:      83.4 cm/s    Systemic Diam: 2.00 cm MV Decel Time: 262 msec MV E velocity: 131.00 cm/s MV A velocity: 120.00 cm/s MV E/A ratio:  1.09  Kate Sable MD Electronically signed by Kate Sable MD Signature Date/Time: 09/20/2020/3:26:25 PM    Final      Medications:    sodium chloride     clindamycin (CLEOCIN) IV      allopurinol  100 mg Oral q morning   amLODipine  10 mg Oral q morning   aspirin EC  325 mg Oral Daily   atorvastatin  40 mg Oral q morning   calcitRIOL  0.25 mcg Oral Daily   calcium carbonate  400 mg of elemental calcium Oral Q breakfast   Chlorhexidine Gluconate Cloth  6 each Topical Q0600   furosemide  80 mg Intravenous Q12H   heparin  5,000 Units Subcutaneous Q8H   isosorbide mononitrate  60 mg Oral q morning   metoprolol  200 mg Oral q morning   sodium bicarbonate  1,300 mg Oral BID   sodium chloride flush  3 mL Intravenous Q12H   sodium chloride, acetaminophen, haloperidol lactate, heparin, lidocaine-prilocaine, ondansetron (ZOFRAN) IV, sodium chloride flush  Assessment/ Plan:  71 y.o. female with advanced chronic kidney disease, hypertension, osteoarthritis with history of left knee replacement 2021, gout, chronic hep C treated in 2020, cirrhosis, past history of alcohol use, LVH, diastolic dysfunction, current smoker, coronary disease with history of angioplasty admitted on 09/18/2020 for Acute pulmonary edema ([place]) [J81.0] DVT (deep venous thrombosis) ([place]) [I82.409] Acute respiratory failure with hypoxia ([place]) [I69.62] Acute diastolic CHF (congestive heart failure)  ([place]) [I50.31] Stage 5 chronic kidney disease not on chronic dialysis ([place]) [N18.5]  #Chronic kidney disease stage V, now presents with uremia and volume overload.  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 She has a functioning left arm AV fistula which has not been used Dialysis initiated on 09/19/20. Received second dialysis treatment yesterday, tolerated well.  UF goal 1L achieved  Receiving third dialysis today.  Subsequent dialysis will be performed in chair to prepare for outpatient Confusion prior to fistulogram yesterday causing it to be rescheduled, possibly until tomorrow. Rt temp cath placed for HD access.  Dialysis coordinator has confirmed outpatient clinic at [facility]. Start date possibly next Tuesday to allow access evaluation.  IV Lasix 80mg  BID for volume control May be able to restart ACE inhibitor dialysis is initiated   #Hyperkalemia 4.7 today Improved from yesterday with diuretics and dialysis  #Anemia of chronic kidney disease Lab Results  Component Value Date   HGB 10.0 (L) 09/18/2020  Hgb at goal   #Secondary hyperparathyroidism of renal origin Lab Results  Component Value Date   CALCIUM 7.5 (L) 09/21/2020  Continue calcitriol Calcium supplements with breakfast  #Hypoxia believed related to sleep apnea ABG ordered  Afternoon ABG improved Bipap nightly Improved mentation today   LOS: [place] 8/10/202212:40 PM  [place], [place]

## 2020-09-21 NOTE — Op Note (Signed)
  OPERATIVE NOTE   PROCEDURE: Insertion of temporary dialysis catheter catheter right common femoral approach.  PRE-OPERATIVE DIAGNOSIS: Acute on chronic renal insufficiency  POST-OPERATIVE DIAGNOSIS: Same  SURGEON: Katha Cabal M.D.  ANESTHESIA: 1% lidocaine local infiltration  ESTIMATED BLOOD LOSS: Minimal cc  INDICATIONS:   Anna Key is a 70 y.o. female who presents with worsening of her renal function.  She is now volume overloaded and requires hemodialysis.  Risks and benefits of been reviewed all questions been answered patient will undergo placement of a temporary dialysis catheter since she can receive urgent dialysis.  DESCRIPTION: After obtaining full informed written consent, the patient was positioned supine. The right groin was prepped and draped in a sterile fashion. Ultrasound was placed in a sterile sleeve. Ultrasound was utilized to identify the right common femoral vein which is noted to be echolucent and compressible indicating patency. Images recorded for the permanent record. Under real-time visualization a Seldinger needle is inserted into the vein and the guidewires advanced without difficulty. Small counterincision was made at the wire insertion site. Dilator is passed over the wire and the temporary dialysis catheter catheter is fed over the wire without difficulty.  All lumens aspirate and flush easily and are packed with heparin saline. Catheter secured to the skin of the right thigh with 2-0 silk. A sterile dressing is applied with Biopatch.  COMPLICATIONS: None  CONDITION: Unchanged  Anna Key Office:  [phone] 09/21/2020, 12:39 PM

## 2020-09-22 ENCOUNTER — Inpatient Hospital Stay
Admission: EM | Disposition: A | Discharge status: Home Health | Payer: Self-pay | Source: Home / Self Care | Attending: Internal Medicine

## 2020-09-22 ENCOUNTER — Encounter: Payer: Self-pay | Admitting: Vascular Surgery

## 2020-09-22 ENCOUNTER — Other Ambulatory Visit (INDEPENDENT_AMBULATORY_CARE_PROVIDER_SITE_OTHER): Payer: Self-pay | Admitting: Vascular Surgery

## 2020-09-22 DIAGNOSIS — N186 End stage renal disease: Secondary | ICD-10-CM

## 2020-09-22 DIAGNOSIS — Y832 Surgical operation with anastomosis, bypass or graft as the cause of abnormal reaction of the patient, or of later complication, without mention of misadventure at the time of the procedure: Secondary | ICD-10-CM

## 2020-09-22 DIAGNOSIS — T82898A Other specified complication of vascular prosthetic devices, implants and grafts, initial encounter: Secondary | ICD-10-CM

## 2020-09-22 HISTORY — PX: A/V FISTULAGRAM: CATH118298

## 2020-09-22 LAB — CBC WITH DIFFERENTIAL/PLATELET
Abs Immature Granulocytes: 0.02 10*3/uL (ref 0.00–0.07)
Basophils Absolute: 0 10*3/uL (ref 0.0–0.1)
Basophils Relative: 0 %
Eosinophils Absolute: 0.1 10*3/uL (ref 0.0–0.5)
Eosinophils Relative: 1 %
HCT: 29.4 % — ABNORMAL LOW (ref 36.0–46.0)
Hemoglobin: 8.9 g/dL — ABNORMAL LOW (ref 12.0–15.0)
Immature Granulocytes: 0 %
Lymphocytes Relative: 14 %
Lymphs Abs: 0.9 10*3/uL (ref 0.7–4.0)
MCH: 29.5 pg (ref 26.0–34.0)
MCHC: 30.3 g/dL (ref 30.0–36.0)
MCV: 97.4 fL (ref 80.0–100.0)
Monocytes Absolute: 0.7 10*3/uL (ref 0.1–1.0)
Monocytes Relative: 10 %
Neutro Abs: 4.8 10*3/uL (ref 1.7–7.7)
Neutrophils Relative %: 75 %
Platelets: 141 10*3/uL — ABNORMAL LOW (ref 150–400)
RBC: 3.02 MIL/uL — ABNORMAL LOW (ref 3.87–5.11)
RDW: 23.2 % — ABNORMAL HIGH (ref 11.5–15.5)
WBC: 6.5 10*3/uL (ref 4.0–10.5)
nRBC: 1.1 % — ABNORMAL HIGH (ref 0.0–0.2)

## 2020-09-22 LAB — BASIC METABOLIC PANEL
Anion gap: 7 (ref 5–15)
BUN: 37 mg/dL — ABNORMAL HIGH (ref 8–23)
CO2: 27 mmol/L (ref 22–32)
Calcium: 7.7 mg/dL — ABNORMAL LOW (ref 8.9–10.3)
Chloride: 100 mmol/L (ref 98–111)
Creatinine, Ser: 4.55 mg/dL — ABNORMAL HIGH (ref 0.44–1.00)
GFR, Estimated: 10 mL/min — ABNORMAL LOW (ref >60)
Glucose, Bld: 97 mg/dL (ref 70–99)
Potassium: 4.3 mmol/L (ref 3.5–5.1)
Sodium: 134 mmol/L — ABNORMAL LOW (ref 135–145)

## 2020-09-22 SURGERY — A/V FISTULAGRAM
Anesthesia: Moderate Sedation

## 2020-09-22 MED ORDER — LOSARTAN POTASSIUM 25 MG PO TABS
25.0000 mg | ORAL_TABLET | Freq: Every day | ORAL | Status: DC
  Administered 2020-09-22 – 2020-09-23 (×2): 25 mg via ORAL
  Filled 2020-09-22 (×2): qty 1

## 2020-09-22 MED ORDER — FENTANYL CITRATE (PF) 100 MCG/2ML IJ SOLN
INTRAMUSCULAR | Status: AC
  Filled 2020-09-22: qty 2

## 2020-09-22 MED ORDER — IODIXANOL 320 MG/ML IV SOLN
INTRAVENOUS | Status: DC | PRN
  Administered 2020-09-22: 20 mL via INTRAVENOUS

## 2020-09-22 MED ORDER — SODIUM CHLORIDE 0.9 % IV SOLN: INTRAVENOUS | Status: DC

## 2020-09-22 MED ORDER — CLINDAMYCIN PHOSPHATE 300 MG/50ML IV SOLN
INTRAVENOUS | Status: AC
  Filled 2020-09-22: qty 50

## 2020-09-22 MED ORDER — MIDAZOLAM HCL 5 MG/5ML IJ SOLN
INTRAMUSCULAR | Status: AC
  Filled 2020-09-22: qty 5

## 2020-09-22 MED ORDER — FENTANYL CITRATE (PF) 100 MCG/2ML IJ SOLN
INTRAMUSCULAR | Status: DC | PRN
  Administered 2020-09-22: 50 ug via INTRAVENOUS

## 2020-09-22 MED ORDER — HEPARIN SODIUM (PORCINE) 1000 UNIT/ML IJ SOLN
INTRAMUSCULAR | Status: AC
  Filled 2020-09-22: qty 1

## 2020-09-22 MED ORDER — MIDAZOLAM HCL 2 MG/2ML IJ SOLN
INTRAMUSCULAR | Status: DC | PRN
  Administered 2020-09-22: 2 mg via INTRAVENOUS

## 2020-09-22 SURGICAL SUPPLY — 7 items
COVER PROBE U/S 5X48 (MISCELLANEOUS) ×2 IMPLANT
DRAPE BRACHIAL (DRAPES) ×2 IMPLANT
KIT MICROPUNCTURE NIT STIFF (SHEATH) ×2 IMPLANT
PACK ANGIOGRAPHY (CUSTOM PROCEDURE TRAY) ×2 IMPLANT
SHEATH BRITE TIP 6FRX5.5 (SHEATH) ×2 IMPLANT
SUT MNCRL AB 4-0 PS2 18 (SUTURE) ×2 IMPLANT
TOWEL OR 17X26 4PK STRL BLUE (TOWEL DISPOSABLE) ×2 IMPLANT

## 2020-09-22 NOTE — Progress Notes (Signed)
PROGRESS NOTE  Anna Key  DOB: 09/02/1950  PCP: Kirk Ruths, MD HCW:[id number]  DOA: 09/18/2020  LOS: 4 days  Hospital Day: 5   Chief Complaint  Patient presents with   Leg Swelling   Shortness of Breath    Brief narrative: Anna Key is a 70 y.o. female with PMH significant for HTN, HLD, CKD 5, chronic diastolic CHF, noncompliance to medications. Patient presented to the ED on 8/7 with complaint of shortness of breath and lower extremity swelling which has been progressively worsening for the last couple of weeks   In the ED, creatinine was elevated to 6.05, BNP was elevated to more than 1500. Chest x-ray showed cardiomegaly, central pulmonary vascular congestion and new small bilateral pleural effusions.  Patient was admitted to hospitalist service. Nephrology consultation was obtained. 8/9, temporary catheter was placed and patient was started on dialysis. See below for details  Subjective: Patient was seen and examined this morning. Sitting up at the edge of the bed.  Not in distress.  On low-flow oxygen.  Pedal edema improving.  Assessment/Plan: Acute volume overload and uremia ESRD -Presented with shortness of breath, pedal edema and worsening CKD -Patient's CKD has progressed to ESRD. -Nephrology consultation was obtained. -8/9 temporary dialysis catheter was placed and patient was started on dialysis.  She was also aggressively diuresed with IV Lasix.   -Patient has a functioning left arm AV fistula which has not been used.  Noted a tentative plan for fistulogram prior to discharge. -Noted outpatient dialysis clinic arrangement at [facility] in [facility].   Acute diastolic dysfunction CHF Essential hypertension -Echo from 2018 showed an EF 60 to 65% with grade 2 diastolic dysfunction. -Worsening of volume overload and CKD probably due to dietary indiscretion and noncompliance to diuretics. -Continue nitrate, beta-blocker, amlodipine.    Hyperkalemia -Corrected after dialysis was initiated. -ACE inhibitor has been on hold.  Blood pressure remained stable. Recent Labs  Lab 09/18/20 0746 09/19/20 0530 09/20/20 0624 09/21/20 0547 09/22/20 0933  K 5.4* 5.5* 5.4* 4.7 4.3    Nicotine dependence -Smoking cessation counseling was done during her hospitalization -Continue nicotine patch    Anemia of chronic disease -Hemoglobin stable. Recent Labs    11/12/19 1001 05/16/20 1329 08/26/20 1341 09/18/20 0746 09/22/20 0933  HGB 9.7* 7.7* 8.9* 10.0* 8.9*  MCV 87.7 84.2 88.6 96.2 97.4  VITAMINB12  --  393  --   --   --   FOLATE  --  14.3  --   --   --   FERRITIN  --  12 46  --   --   TIBC  --  448 286  --   --   IRON  --  25* 39  --   --   RETICCTPCT  --  3.4*  --   --   --     Mobility: PT eval obtained. Code Status:   Code Status: Full Code  Nutritional status: Body mass index is 36.78 kg/m.     Diet:  Diet Order             Diet NPO time specified  Diet effective now                  DVT prophylaxis:  heparin injection 5,000 Units Start: 09/18/20 1400   Antimicrobials: None Fluid: None Consultants: Nephrology, vascular surgery Family Communication: None at bedside  Status is: Inpatient.   Remains inpatient appropriate because: Pending fistulogram and temporary dialysis catheter removal  Dispo: The patient is from: Home              Anticipated d/c is to: Home probably tomorrow              Patient currently is not medically stable to d/c.   Difficult to place patient No     Infusions:   sodium chloride     sodium chloride     clindamycin      Scheduled Meds:  allopurinol  100 mg Oral q morning   amLODipine  10 mg Oral q morning   aspirin EC  325 mg Oral Daily   atorvastatin  40 mg Oral q morning   calcitRIOL  0.25 mcg Oral Daily   calcium carbonate  400 mg of elemental calcium Oral Q breakfast   Chlorhexidine Gluconate Cloth  6 each Topical Q0600   heparin  5,000 Units  Subcutaneous Q8H   heparin sodium (porcine)       isosorbide mononitrate  60 mg Oral q morning   metoprolol  200 mg Oral q morning   sodium chloride flush  3 mL Intravenous Q12H   torsemide  40 mg Oral Daily    Antimicrobials: Anti-infectives (From admission, onward)    Start     Dose/Rate Route Frequency Ordered Stop   09/22/20 1429  clindamycin (CLEOCIN) 300 MG/50ML IVPB       Note to Pharmacy: Delametter, Gretchen: cabinet override      09/22/20 1429 09/23/20 0244   09/21/20 0600  clindamycin (CLEOCIN) IVPB 300 mg        300 mg 100 mL/hr over 30 Minutes Intravenous On call to O.R. 09/20/20 1137 09/22/20 0559       PRN meds: sodium chloride, acetaminophen, haloperidol lactate, lidocaine-prilocaine, ondansetron (ZOFRAN) IV, sodium chloride flush   Objective: Vitals:   09/22/20 1245 09/22/20 1328  BP: 133/72 122/73  Pulse: 64 69  Resp: (!) 23 16  Temp:  98.4 F (36.9 C)  SpO2:  95%    Intake/Output Summary (Last 24 hours) at 09/22/2020 1437 Last data filed at 09/22/2020 0950 Gross per 24 hour  Intake 480 ml  Output --  Net 480 ml    Filed Weights   09/18/20 2223 09/20/20 0330 09/21/20 0623  Weight: 99.6 kg 98.7 kg 97.2 kg   Weight change:  Body mass index is 36.78 kg/m.   Physical Exam: General exam: Pleasant, elderly African-American female.  Not in physical distress Skin: No rashes, lesions or ulcers. HEENT: Atraumatic, normocephalic, no obvious bleeding Lungs: Clear to auscultation bilaterally CVS: Regular rate and rhythm, no murmur GI/Abd soft, nontender, nondistended, bowel sound present CNS: Alert, awake oriented x3 Psychiatry: Mood appropriate Extremities: Pedal edema 1+ bilaterally, no calf tenderness  Data Review: I have personally reviewed the laboratory data and studies available.  Recent Labs  Lab 09/18/20 0746 09/22/20 0933  WBC 6.8 6.5  NEUTROABS  --  4.8  HGB 10.0* 8.9*  HCT 32.9* 29.4*  MCV 96.2 97.4  PLT 181 141*    Recent  Labs  Lab 09/18/20 0746 09/19/20 0530 09/20/20 0624 09/21/20 0547 09/22/20 0933  NA 140 143 139 138 134*  K 5.4* 5.5* 5.4* 4.7 4.3  CL 108 110 104 101 100  CO2 23 28 25 28 27   GLUCOSE 91 142* 115* 78 97  BUN 59* 64* 49* 35* 37*  CREATININE 6.05* 6.41* 5.88* 4.55* 4.55*  CALCIUM 7.6* 7.7* 7.6* 7.5* 7.7*     F/u labs ordered Anna Key (From  admission, onward)     Start     Ordered   09/22/20 5883  Basic metabolic panel  Daily,   R      09/21/20 1503   09/22/20 0500  CBC with Differential/Platelet  Daily,   R      09/21/20 1503   09/19/20 0550  QuantiFERON-TB Gold Plus  Once,   R        09/19/20 0550            Signed, Terrilee Croak, MD [facility] 09/22/2020

## 2020-09-22 NOTE — Progress Notes (Addendum)
Anna Key, [place] 09/22/20  Subjective:   Hospital day # 4  Patient known to our practice from outpatient follow-up.  She has history of advanced CKD, diastolic CHF, hypertension and dyslipidemia.  She presents to the emergency room for worsening shortness of breath and lower extremity edema for the past 2 weeks or so.    Patient seen sitting at side of bed Alert and oriented at that time Tolerating meals Denies shortness of breath Weaned to 4L [place], SOB with ambulating in room earlier    Objective:  Vital signs in last 24 hours:  Temp:  [97.9 F (36.6 C)-100 F (37.8 C)] 98 F (36.7 C) (08/11 0743) Pulse Rate:  [69-81] 71 (08/11 0743) Resp:  [16-21] 17 (08/11 0743) BP: (105-131)/(63-81) 124/81 (08/11 0743) SpO2:  [92 %-100 %] 94 % (08/11 0743)  Weight change:  Filed Weights   09/18/20 2223 09/20/20 0330 09/21/20 0623  Weight: 99.6 kg 98.7 kg 97.2 kg    Intake/Output:    Intake/Output Summary (Last 24 hours) at 09/22/2020 1029 Last data filed at 09/22/2020 0950 Gross per 24 hour  Intake 480 ml  Output 2000 ml  Net -1520 ml     Physical Exam: General:  No acute distress, sitting at bedside  HEENT  anicteric, moist oral mucous membrane  Pulm/lungs  normal breathing effort, b/l crackles (improving)  CVS/Heart  regular rhythm, no rub or gallop  Abdomen:   Soft, nontender  Extremities:  ++ peripheral edema  Neurologic:  Alert, able to follow commands  Skin:  No acute rashes  Access: Rt femoral temp cath  Basic Metabolic Panel:  Recent Labs  Lab 09/18/20 0746 09/19/20 0530 09/20/20 0624 09/21/20 0547 09/22/20 0933  NA 140 143 139 138 134*  K 5.4* 5.5* 5.4* 4.7 4.3  CL 108 110 104 101 100  CO2 23 28 25 28 27   GLUCOSE 91 142* 115* 78 97  BUN 59* 64* 49* 35* 37*  CREATININE 6.05* 6.41* 5.88* 4.55* 4.55*  CALCIUM 7.6* 7.7* 7.6* 7.5* 7.7*      CBC: Recent Labs  Lab 09/18/20 0746 09/22/20 0933  WBC 6.8 6.5  NEUTROABS   --  4.8  HGB 10.0* 8.9*  HCT 32.9* 29.4*  MCV 96.2 97.4  PLT 181 141*       Lab Results  Component Value Date   HEPBSAG NON REACTIVE 09/18/2020   HEPBSAG NON REACTIVE 09/18/2020   HEPBSAB NON REACTIVE 09/18/2020      Microbiology:  Recent Results (from the past 240 hour(s))  Resp Panel by RT-PCR (Flu A&B, Covid) Nasopharyngeal Swab     Status: None   Collection Time: 09/18/20 12:19 PM   Specimen: Nasopharyngeal Swab; Nasopharyngeal(NP) swabs in vial transport medium  Result Value Ref Range Status   SARS Coronavirus 2 by RT PCR NEGATIVE NEGATIVE Final    Comment: (NOTE) SARS-CoV-2 target nucleic acids are NOT DETECTED.  The SARS-CoV-2 RNA is generally detectable in upper respiratory specimens during the acute phase of infection. The lowest concentration of SARS-CoV-2 viral copies this assay can detect is 138 copies/mL. A negative result does not preclude SARS-Cov-2 infection and should not be used as the sole basis for treatment or other patient management decisions. A negative result may occur with  improper specimen collection/handling, submission of specimen other than nasopharyngeal swab, presence of viral mutation(s) within the areas targeted by this assay, and inadequate number of viral copies(<138 copies/mL). A negative result must be combined with clinical observations, patient history,  and epidemiological information. The expected result is Negative.  Fact Sheet for Patients:  EntrepreneurPulse.com.au  Fact Sheet for Healthcare Providers:  IncredibleEmployment.be  This test is no t yet approved or cleared by the [place] FDA and  has been authorized for detection and/or diagnosis of SARS-CoV-2 by FDA under an Emergency Use Authorization (EUA). This EUA will remain  in effect (meaning this test can be used) for the duration of the COVID-19 declaration under Section 564(b)(1) of the Act, 21 U.S.C.section  360bbb-3(b)(1), unless the authorization is terminated  or revoked sooner.       Influenza A by PCR NEGATIVE NEGATIVE Final   Influenza B by PCR NEGATIVE NEGATIVE Final    Comment: (NOTE) The Xpert Xpress SARS-CoV-2/FLU/RSV plus assay is intended as an aid in the diagnosis of influenza from Nasopharyngeal swab specimens and should not be used as a sole basis for treatment. Nasal washings and aspirates are unacceptable for Xpert Xpress SARS-CoV-2/FLU/RSV testing.  Fact Sheet for Patients: EntrepreneurPulse.com.au  Fact Sheet for Healthcare Providers: IncredibleEmployment.be  This test is not yet approved or cleared by the [place] FDA and has been authorized for detection and/or diagnosis of SARS-CoV-2 by FDA under an Emergency Use Authorization (EUA). This EUA will remain in effect (meaning this test can be used) for the duration of the COVID-19 declaration under Section 564(b)(1) of the Act, 21 U.S.C. section 360bbb-3(b)(1), unless the authorization is terminated or revoked.  Performed at [facility], [address] [place], [place] [address]   MRSA Next Gen by PCR, Nasal     Status: None   Collection Time: 09/20/20  9:32 PM   Specimen: Nasal Mucosa; Nasal Swab  Result Value Ref Range Status   MRSA by PCR Next Gen NOT DETECTED NOT DETECTED Final    Comment: (NOTE) The GeneXpert MRSA Assay (FDA approved for NASAL specimens only), is one component of a comprehensive MRSA colonization surveillance program. It is not intended to diagnose MRSA infection nor to guide or monitor treatment for MRSA infections. Test performance is not FDA approved in patients less than 17 years old. Performed at [facility], [address] [place], [place] [address]     Coagulation Studies: No results for input(s): LABPROT, INR in the last 72 hours.  Urinalysis: No results for input(s): COLORURINE, LABSPEC, PHURINE, GLUCOSEU,  HGBUR, BILIRUBINUR, KETONESUR, PROTEINUR, UROBILINOGEN, NITRITE, LEUKOCYTESUR in the last 72 hours.  Invalid input(s): APPERANCEUR    Imaging: PERIPHERAL VASCULAR CATHETERIZATION  Result Date: 09/20/2020 See surgical note for result.  ECHOCARDIOGRAM COMPLETE  Result Date: 09/20/2020    ECHOCARDIOGRAM REPORT   Patient Name:   Anna Key Date of Exam: 09/20/2020 Medical Rec #:  [id number]          Height:       64.0 in Accession #:    [id number]         Weight:       217.5 lb Date of Birth:  1950/12/01          BSA:          2.027 m Patient Age:    70 years           BP:           131/79 mmHg Patient Gender: F                  HR:           67 bpm. Exam Location:  [facility] Procedure: 2D Echo,  Color Doppler, Cardiac Doppler, Strain Analysis and            Intracardiac Opacification Agent Indications:     I50.31 congestive heart failure-Acute Diastolic  History:         Patient has no prior history of Echocardiogram examinations.                  CHF, CKD; Risk Factors:Hypertension and Dyslipidemia. History                  of hepatitis C.  Sonographer:     Charmayne Sheer RDCS (AE) Referring Phys:  [id number] Collier Bullock Diagnosing Phys: Kate Sable MD  Sonographer Comments: Suboptimal parasternal window. Image acquisition challenging due to patient body habitus. Global longitudinal strain was attempted. IMPRESSIONS  1. Left ventricular ejection fraction, by estimation, is 70 to 75%. The left ventricle has hyperdynamic function. The left ventricle has no regional wall motion abnormalities. There is mild left ventricular hypertrophy. Left ventricular diastolic parameters are consistent with Grade II diastolic dysfunction (pseudonormalization). The average left ventricular global longitudinal strain is -17.6 %. The global longitudinal strain is normal.  2. Right ventricular systolic function is normal. The right ventricular size is normal. There is severely elevated pulmonary artery systolic pressure. The  estimated right ventricular systolic pressure is 82.4 mmHg.  3. The mitral valve is degenerative. Mild mitral valve regurgitation.  4. The aortic valve was not well visualized. Aortic valve regurgitation is not visualized.  5. The inferior vena cava is dilated in size with <50% respiratory variability, suggesting right atrial pressure of 15 mmHg. FINDINGS  Left Ventricle: Left ventricular ejection fraction, by estimation, is 70 to 75%. The left ventricle has hyperdynamic function. The left ventricle has no regional wall motion abnormalities. Definity contrast agent was given IV to delineate the left ventricular endocardial borders. The average left ventricular global longitudinal strain is -17.6 %. The global longitudinal strain is normal. The left ventricular internal cavity size was normal in size. There is mild left ventricular hypertrophy. Left ventricular diastolic parameters are consistent with Grade II diastolic dysfunction (pseudonormalization). Right Ventricle: The right ventricular size is normal. No increase in right ventricular wall thickness. Right ventricular systolic function is normal. There is severely elevated pulmonary artery systolic pressure. The tricuspid regurgitant velocity is 3.41 m/s, and with an assumed right atrial pressure of 15 mmHg, the estimated right ventricular systolic pressure is 23.5 mmHg. Left Atrium: Left atrial size was normal in size. Right Atrium: Right atrial size was normal in size. Pericardium: There is no evidence of pericardial effusion. Mitral Valve: The mitral valve is degenerative in appearance. Mild mitral valve regurgitation. MV peak gradient, 5.7 mmHg. The mean mitral valve gradient is 3.0 mmHg. Tricuspid Valve: The tricuspid valve is grossly normal. Tricuspid valve regurgitation is trivial. Aortic Valve: The aortic valve was not well visualized. Aortic valve regurgitation is not visualized. Aortic valve mean gradient measures 13.0 mmHg. Aortic valve peak gradient  measures 25.4 mmHg. Aortic valve area, by VTI measures 3.24 cm. Pulmonic Valve: The pulmonic valve was normal in structure. Pulmonic valve regurgitation is mild. Aorta: The aortic root is normal in size and structure. Venous: The inferior vena cava is dilated in size with less than 50% respiratory variability, suggesting right atrial pressure of 15 mmHg. IAS/Shunts: No atrial level shunt detected by color flow Doppler.  LEFT VENTRICLE PLAX 2D LVIDd:         4.80 cm  Diastology LVIDs:  2.80 cm  LV e' medial:    9.68 cm/s LV PW:         1.20 cm  LV E/e' medial:  13.5 LV IVS:        1.00 cm  LV e' lateral:   6.42 cm/s LVOT diam:     2.00 cm  LV E/e' lateral: 20.4 LV SV:         130 LV SV Index:   64       2D Longitudinal Strain LVOT Area:     3.14 cm 2D Strain GLS Avg:     -17.6 %  RIGHT VENTRICLE RV Basal diam:  3.40 cm LEFT ATRIUM             Index       RIGHT ATRIUM           Index LA diam:        3.50 cm 1.73 cm/m  RA Area:     13.50 cm LA Vol (A2C):   83.1 ml 40.99 ml/m RA Volume:   29.10 ml  14.35 ml/m LA Vol (A4C):   61.8 ml 30.48 ml/m LA Biplane Vol: 74.0 ml 36.50 ml/m  AORTIC VALVE                    PULMONIC VALVE AV Area (Vmax):    2.51 cm     PV Vmax:       1.79 m/s AV Area (Vmean):   2.68 cm     PV Vmean:      114.000 cm/s AV Area (VTI):     3.24 cm     PV VTI:        0.373 m AV Vmax:           252.00 cm/s  PV Peak grad:  12.8 mmHg AV Vmean:          165.000 cm/s PV Mean grad:  6.0 mmHg AV VTI:            0.401 m AV Peak Grad:      25.4 mmHg AV Mean Grad:      13.0 mmHg LVOT Vmax:         201.00 cm/s LVOT Vmean:        141.000 cm/s LVOT VTI:          0.414 m LVOT/AV VTI ratio: 1.03  AORTA Ao Root diam: 3.00 cm MITRAL VALVE                TRICUSPID VALVE MV Area (PHT): 2.90 cm     TR Peak grad:   46.5 mmHg MV Area VTI:   3.07 cm     TR Vmax:        341.00 cm/s MV Peak grad:  5.7 mmHg MV Mean grad:  3.0 mmHg     SHUNTS MV Vmax:       1.19 m/s     Systemic VTI:  0.41 m MV Vmean:      83.4  cm/s    Systemic Diam: 2.00 cm MV Decel Time: 262 msec MV E velocity: 131.00 cm/s MV A velocity: 120.00 cm/s MV E/A ratio:  1.09 Kate Sable MD Electronically signed by Kate Sable MD Signature Date/Time: 09/20/2020/3:26:25 PM    Final      Medications:    sodium chloride      allopurinol  100 mg Oral q morning   amLODipine  10 mg Oral q morning   aspirin EC  325 mg Oral Daily   atorvastatin  40 mg Oral q morning   calcitRIOL  0.25 mcg Oral Daily   calcium carbonate  400 mg of elemental calcium Oral Q breakfast   Chlorhexidine Gluconate Cloth  6 each Topical Q0600   heparin  5,000 Units Subcutaneous Q8H   isosorbide mononitrate  60 mg Oral q morning   metoprolol  200 mg Oral q morning   sodium chloride flush  3 mL Intravenous Q12H   torsemide  40 mg Oral Daily   sodium chloride, acetaminophen, haloperidol lactate, lidocaine-prilocaine, ondansetron (ZOFRAN) IV, sodium chloride flush  Assessment/ Plan:  70 y.o. female with advanced chronic kidney disease, hypertension, osteoarthritis with history of left knee replacement 2021, gout, chronic hep C treated in 2020, cirrhosis, past history of alcohol use, LVH, diastolic dysfunction, current smoker, coronary disease with history of angioplasty admitted on 09/18/2020 for Acute pulmonary edema ([place]) [J81.0] DVT (deep venous thrombosis) (HCC) [I82.409] Acute respiratory failure with hypoxia (HCC) [D47.09] Acute diastolic CHF (congestive heart failure) (HCC) [I50.31] Stage 5 chronic kidney disease not on chronic dialysis ([place]) [N18.5]  # End-stage renal disease. # Volume overload Dialysis initiated on 09/19/20. Dialysis received yesterday with UF 2L removed. Tolerated well Attempted dialysis prior to Angiogram, but was terminated early due to needed procedure. Vascular performed angiogram. Awaiting findings.  Plan to dialyze tomorrow in the chair. If stable afterwards, ok to discharge from renal stance and can start outpatient clinic  on Tuesday Dialysis coordinator has confirmed outpatient clinic at [facility] with a start date next Tuesday. Start torsemide Start low dose losartan for CV risk reduction   #Hyperkalemia 4.3  Improved from yesterday with diuretics and dialysis  #Anemia of chronic kidney disease Lab Results  Component Value Date   HGB 8.9 (L) 09/22/2020  Hgb slightly below target Will monitor   #Secondary hyperparathyroidism of renal origin Lab Results  Component Value Date   CALCIUM 7.7 (L) 09/22/2020  Continue calcitriol Calcium supplements with breakfast  #Hypoxia believed related to sleep apnea Confusion prior to initial angiogram on 09/19/20, aborted and rescheduled for 09/22/20 Bipap nightly Improved mentation today   LOS: [place] 8/11/202210:29 [place], [place]

## 2020-09-22 NOTE — Progress Notes (Signed)
Patient back from fistulagram. She is very drowsy but will arouse to painful stimuli. Vitals are stable. No complaints of pain. Will continue to monitor.

## 2020-09-22 NOTE — Progress Notes (Signed)
PT able to eat some crackers and drink some juice.

## 2020-09-22 NOTE — Progress Notes (Signed)
Patient accepted at [facility] TTS 10:45. Patient can start on Tuesday 8/16 at 10:15am.

## 2020-09-22 NOTE — TOC Initial Note (Signed)
Transition of Care [facility]) - Initial/Assessment Note    Patient Details  Name: Anna Key MRN: [id number] Date of Birth: Jan 20, 1951  Transition of Care [facility]) CM/SW Contact:    Alberteen Sam, LCSW Phone Number: 09/22/2020, 12:13 PM  Clinical Narrative:                  CSW met with patient at bedside for home health recommendations from PT/OT.   Patient reports she is agreeable to home health services with no preference of agency, and could use a walker. Patient reports she lives home with her husband. No other needs identified.   CSW has sent out home health services and DME walker to be delivered by [employer] prior to discharge.     Expected Discharge Plan: [place] Barriers to Discharge: Continued Medical Work up   Patient Goals and CMS Choice Patient states their goals for this hospitalization and ongoing recovery are:: to go home CMS Medicare.gov Compare Post Acute Care list provided to:: Patient Choice offered to / list presented to : Patient  Expected Discharge Plan and Services Expected Discharge Plan: [place] Choice: [place] arrangements for the past 2 months: [place] Arranged: PT, OT, RN   Date HH Agency Contacted: 09/22/20 Time HH Agency Contacted: 1212    Prior Living Arrangements/Services Living arrangements for the past 2 months: [place] Lives with:: Spouse Patient language and need for interpreter reviewed:: Yes Do you feel safe going back to the place where you live?: Yes      Need for Family Participation in Patient Care: Yes (Comment) Care giver support system in place?: Yes (comment)   Criminal Activity/Legal Involvement Pertinent to Current Situation/Hospitalization: No - Comment as needed  Activities of Daily Living Home Assistive Devices/Equipment: Eyeglasses, Shower chair without back ADL Screening (condition at  time of admission) Patient's cognitive ability adequate to safely complete daily activities?: Yes Is the patient deaf or have difficulty hearing?: No Does the patient have difficulty seeing, even when wearing glasses/contacts?: No Does the patient have difficulty concentrating, remembering, or making decisions?: No Patient able to express need for assistance with ADLs?: Yes Does the patient have difficulty dressing or bathing?: No Independently performs ADLs?: No Communication: Independent Dressing (OT): Independent Grooming: Independent Feeding: Independent Bathing: Independent Toileting: Needs assistance Is this a change from baseline?: Change from baseline, expected to last <3 days In/Out Bed: Needs assistance Is this a change from baseline?: Change from baseline, expected to last <3 days Walks in Home: Independent with device (comment) Does the patient have difficulty walking or climbing stairs?: Yes Weakness of Legs: Both Weakness of Arms/Hands: None  Permission Sought/Granted Permission sought to share information with : Case Manager, Customer service manager, Family Supports Permission granted to share information with : Yes, Verbal Permission Granted  Share Information with NAME: Lavell Luster  Permission granted to share info w AGENCY: HH  Permission granted to share info w Relationship: spouse  Permission granted to share info w Contact Information: [phone]  Emotional Assessment Appearance:: Appears stated age Attitude/Demeanor/Rapport: Gracious Affect (typically observed): Calm Orientation: : Oriented to Place, Oriented to Self, Oriented to Situation, Oriented to  Time Alcohol / Substance Use: Not Applicable Psych Involvement: No (comment)  Admission diagnosis:  Acute  pulmonary edema (HCC) [J81.0] DVT (deep venous thrombosis) (HCC) [I82.409] Acute respiratory failure with hypoxia (HCC) [Y60.60] Acute diastolic CHF (congestive heart failure) ([place])  [I50.31] Stage 5 chronic kidney disease not on chronic dialysis [facility]) [N18.5] Patient Active Problem List   Diagnosis Date Noted   Acute diastolic CHF (congestive heart failure) ([place]) 09/18/2020   Hyperkalemia 09/18/2020   Iron deficiency anemia 05/24/2020   Anemia in chronic kidney disease 03/08/2020   Cigarette smoker 03/08/2020   Edema of lower extremity 03/08/2020   History of hepatitis C 03/08/2020   Hyperparathyroidism due to renal insufficiency ([place]) 03/08/2020   Persistent proteinuria 03/08/2020   Renal insufficiency 09/02/2019   Essential hypertension 09/02/2019   Hyperlipidemia 09/02/2019   CAD (coronary artery disease) 09/02/2019   Hypocalcemia 06/04/2019   Chronic viral hepatitis C ([place]) 10/22/2017   Status post right partial knee replacement 06/11/2017   Healthcare maintenance 04/04/2017   LVH (left ventricular hypertrophy) due to hypertensive disease 04/04/2017   Chronic kidney disease, stage 4 (severe) ([place]) 04/04/2017   Primary osteoarthritis of right knee 06/27/2015   Shoulder pain, left 01/19/2014   Chronic kidney disease (CKD), stage V ([place]) 06/12/2013   Gout 06/05/2012   Tobacco use 06/05/2012   Primary localized osteoarthrosis, lower leg 06/09/2010   Atherosclerotic heart disease of native coronary artery without angina pectoris 06/02/2009   PCP:  Kirk Ruths, MD Pharmacy:   [facility] [address] ([address]), [place] [address] [place]) [place] [address] Phone: [phone] Fax: [phone]     Social Determinants of Health (SDOH) Interventions    Readmission Risk Interventions No flowsheet data found.

## 2020-09-22 NOTE — Op Note (Signed)
 VEIN AND VASCULAR SURGERY    OPERATIVE NOTE   PROCEDURE: 1.   Left brachiocephalic arteriovenous fistula cannulation under ultrasound guidance 2.   Left arm fistulagram including central venogram   PRE-OPERATIVE DIAGNOSIS: 1. ESRD 2. Poorly functional difficult access left brachiocephalic AVF  POST-OPERATIVE DIAGNOSIS: same as above   SURGEON: Leotis Pain, MD  ANESTHESIA: local with MCS  ESTIMATED BLOOD LOSS: 3 cc  FINDING(S): Left brachiocephalic AV fistula was widely patent without any significant stenosis.  It had very mild tortuosity and was not particularly deep on ultrasound.  SPECIMEN(S):  None  CONTRAST: 20 cc  FLUORO TIME: 0.4 minutes  MODERATE CONSCIOUS SEDATION TIME: Approximately 21 minutes with 2 mg of Versed and 50 mcg of Fentanyl   INDICATIONS: Anna Key is a 70 y.o. female who presents with malfunctioning difficult access left brachiocephalic arteriovenous fistula.  The patient is scheduled for left arm fistulagram.  The patient is aware the risks include but are not limited to: bleeding, infection, thrombosis of the cannulated access, and possible anaphylactic reaction to the contrast.  The patient is aware of the risks of the procedure and elects to proceed forward.  DESCRIPTION: After full informed written consent was obtained, the patient was brought back to the angiography suite and placed supine upon the angiography table.  The patient was connected to monitoring equipment. Moderate conscious sedation was administered with a face to face encounter with the patient throughout the procedure with my supervision of the RN administering medicines and monitoring the patient's vital signs and mental status throughout from the start of the procedure until the patient was taken to the recovery room. The left arm was prepped and draped in the standard fashion for a percutaneous access intervention.  Under ultrasound guidance, the left brachiocephalic  arteriovenous fistula was cannulated with a micropuncture needle under direct ultrasound guidance where it was patent and a permanent image was performed.  The microwire was advanced into the fistula and the needle was exchanged for the a microsheath.  I then upsized to a 6 Fr Sheath and imaging was performed.  Hand injections were completed to image the access including the central venous system.  Compression of the fistula was performed to evaluate the arteriovenous anastomosis as well.  This demonstrated a widely patent left brachiocephalic AV fistula without significant stenosis.  Had very mild tortuosity.  Based on the images, this patient will need no intervention to the fistula and it should be usable for dialysis.  A 4-0 Monocryl purse-string suture was sewn around the sheath.  The sheath was removed while tying down the suture.  A sterile bandage was applied to the puncture site.  COMPLICATIONS: None  CONDITION: Stable   Leotis Pain  09/22/2020 4:00 PM   This note was created with Dragon Medical transcription system. Any errors in dictation are purely unintentional.

## 2020-09-22 NOTE — Progress Notes (Signed)
CSW consulted for heart failure home health screen, notes Heart Failure RN Delmar Landau is following for heart failure needs.  TOC will follow for any PT/OT dispo needs if arise.   [place], [place]

## 2020-09-22 NOTE — Progress Notes (Signed)
OT Cancellation Note  Patient Details Name: Anna Key MRN: [id number] DOB: [birth year]   Cancelled Treatment:    Reason Eval/Treat Not Completed: Patient at procedure or test/ unavailable Pt OTF for fistula procedure, will f/u at later date/time as able for OT treatment. Thank you.  Gerrianne Scale, [place], OTR/L ascom [phone] 09/22/20, 4:17 PM

## 2020-09-22 NOTE — Progress Notes (Signed)
PT Cancellation Note  Patient Details Name: ARABELLE BOLLIG MRN: [id number] DOB: 04-Sep-1950   Cancelled Treatment:    Reason Eval/Treat Not Completed: Patient at procedure or test/unavailable (Chart reviewed for planned treatment session.  Patient currently off unit for dialysis.  Will re-attempt at later time/date as medically appropriate and available.)   Marchetta Navratil H. Owens Shark, PT, DPT, NCS 09/22/20, 12:05 PM [phone]

## 2020-09-23 ENCOUNTER — Encounter: Payer: Self-pay | Admitting: Vascular Surgery

## 2020-09-23 DIAGNOSIS — J9601 Acute respiratory failure with hypoxia: Secondary | ICD-10-CM

## 2020-09-23 DIAGNOSIS — I82409 Acute embolism and thrombosis of unspecified deep veins of unspecified lower extremity: Secondary | ICD-10-CM

## 2020-09-23 DIAGNOSIS — J810 Acute pulmonary edema: Secondary | ICD-10-CM | POA: Diagnosis not present

## 2020-09-23 DIAGNOSIS — N185 Chronic kidney disease, stage 5: Secondary | ICD-10-CM

## 2020-09-23 LAB — CBC WITH DIFFERENTIAL/PLATELET
Abs Immature Granulocytes: 0.04 10*3/uL (ref 0.00–0.07)
Basophils Absolute: 0 10*3/uL (ref 0.0–0.1)
Basophils Relative: 0 %
Eosinophils Absolute: 0.1 10*3/uL (ref 0.0–0.5)
Eosinophils Relative: 1 %
HCT: 27.7 % — ABNORMAL LOW (ref 36.0–46.0)
Hemoglobin: 8.3 g/dL — ABNORMAL LOW (ref 12.0–15.0)
Immature Granulocytes: 1 %
Lymphocytes Relative: 12 %
Lymphs Abs: 0.9 10*3/uL (ref 0.7–4.0)
MCH: 28.9 pg (ref 26.0–34.0)
MCHC: 30 g/dL (ref 30.0–36.0)
MCV: 96.5 fL (ref 80.0–100.0)
Monocytes Absolute: 0.7 10*3/uL (ref 0.1–1.0)
Monocytes Relative: 10 %
Neutro Abs: 5.3 10*3/uL (ref 1.7–7.7)
Neutrophils Relative %: 76 %
Platelets: 131 10*3/uL — ABNORMAL LOW (ref 150–400)
RBC: 2.87 MIL/uL — ABNORMAL LOW (ref 3.87–5.11)
RDW: 22.8 % — ABNORMAL HIGH (ref 11.5–15.5)
Smear Review: NORMAL
WBC: 7 10*3/uL (ref 4.0–10.5)
nRBC: 0.7 % — ABNORMAL HIGH (ref 0.0–0.2)

## 2020-09-23 LAB — BASIC METABOLIC PANEL
Anion gap: 6 (ref 5–15)
BUN: 37 mg/dL — ABNORMAL HIGH (ref 8–23)
CO2: 28 mmol/L (ref 22–32)
Calcium: 7.7 mg/dL — ABNORMAL LOW (ref 8.9–10.3)
Chloride: 101 mmol/L (ref 98–111)
Creatinine, Ser: 4.61 mg/dL — ABNORMAL HIGH (ref 0.44–1.00)
GFR, Estimated: 10 mL/min — ABNORMAL LOW (ref >60)
Glucose, Bld: 104 mg/dL — ABNORMAL HIGH (ref 70–99)
Potassium: 4.6 mmol/L (ref 3.5–5.1)
Sodium: 135 mmol/L (ref 135–145)

## 2020-09-23 MED ORDER — EPOETIN ALFA 10000 UNIT/ML IJ SOLN
4000.0000 [IU] | INTRAMUSCULAR | Status: DC
  Administered 2020-09-23: 4000 [IU] via INTRAVENOUS
  Filled 2020-09-23: qty 1

## 2020-09-23 NOTE — Progress Notes (Signed)
PROGRESS NOTE  Anna Key  DOB: 1950-12-22  PCP: Kirk Ruths, MD HFW:[id number]  DOA: 09/18/2020  LOS: 5 days  Hospital Day: 6   Chief Complaint  Patient presents with   Leg Swelling   Shortness of Breath    Brief narrative: Anna Key is a 70 y.o. female with PMH significant for HTN, HLD, CKD 5, chronic diastolic CHF, noncompliance to medications. Patient presented to the ED on 8/7 with complaint of shortness of breath and lower extremity swelling which has been progressively worsening for the last couple of weeks   In the ED, creatinine was elevated to 6.05, BNP was elevated to more than 1500. Chest x-ray showed cardiomegaly, central pulmonary vascular congestion and new small bilateral pleural effusions.  Patient was admitted to hospitalist service. Nephrology consultation was obtained. 8/9, temporary catheter was placed and patient was started on dialysis. See below for details  Subjective: Patient was seen and examined this morning. Sitting up at the edge of the bed.  Not in distress.  She was waiting for dialysis today. Per RN, this morning patient was confused.  By the time of my evaluation, confusion had cleared up.  Assessment/Plan: Acute volume overload and uremia ESRD -Presented with shortness of breath, pedal edema and worsening CKD -Patient's CKD has progressed to ESRD. -Nephrology consultation was obtained. -8/9 temporary dialysis catheter was placed and patient was started on dialysis.  She was also aggressively diuresed with IV Lasix.  Currently on torsemide 40 mg daily. -Patient has a functioning left arm AV fistula.  No stenosis was noted on the fistulogram done on 8/11. -Arranged for outpatient dialysis clinic arrangement at [facility] in [facility] to start on Tuesday 8/16.  Acute diastolic dysfunction CHF Essential hypertension -Echo from 2018 showed an EF 60 to 65% with grade 2 diastolic dysfunction. -Worsening of volume  overload and CKD probably due to dietary indiscretion and noncompliance to diuretics. -Continue nitrate, beta-blocker, amlodipine.   Hyperkalemia -Corrected after dialysis was initiated. -ACE inhibitor has been on hold.  Blood pressure remained stable. Recent Labs  Lab 09/19/20 0530 09/20/20 0624 09/21/20 0547 09/22/20 0933 09/23/20 0615  K 5.5* 5.4* 4.7 4.3 4.6    Nicotine dependence -Smoking cessation counseling was done during her hospitalization -Continue nicotine patch    Anemia of chronic disease -Hemoglobin stable. Recent Labs    05/16/20 1329 08/26/20 1341 09/18/20 0746 09/22/20 0933 09/23/20 0615  HGB 7.7* 8.9* 10.0* 8.9* 8.3*  MCV 84.2 88.6 96.2 97.4 96.5  VITAMINB12 393  --   --   --   --   FOLATE 14.3  --   --   --   --   FERRITIN 12 46  --   --   --   TIBC 448 286  --   --   --   IRON 25* 39  --   --   --   RETICCTPCT 3.4*  --   --   --   --     Impaired mobility -To be evaluated by PT after temper dialysis catheter is removed today.  Mobility: PT eval obtained. Code Status:   Code Status: Full Code  Nutritional status: Body mass index is 37.01 kg/m.     Diet:  Diet Order             Diet renal/carb modified with fluid restriction Diet-HS Snack? Nothing; Fluid restriction: 1200 mL Fluid; Room service appropriate? Yes; Fluid consistency: Thin  Diet effective now  DVT prophylaxis:  heparin injection 5,000 Units Start: 09/18/20 1400   Antimicrobials: None Fluid: None Consultants: Nephrology, vascular surgery Family Communication: None at bedside  Status is: Inpatient.   Remains inpatient appropriate because: Pending PT evaluation   Dispo: The patient is from: Home              Anticipated d/c is to: Home probably tomorrow.  Discussed with nephrology.              Patient currently is not medically stable to d/c.   Difficult to place patient No     Infusions:   sodium chloride      Scheduled Meds:   allopurinol  100 mg Oral q morning   amLODipine  10 mg Oral q morning   aspirin EC  325 mg Oral Daily   atorvastatin  40 mg Oral q morning   calcitRIOL  0.25 mcg Oral Daily   calcium carbonate  400 mg of elemental calcium Oral Q breakfast   Chlorhexidine Gluconate Cloth  6 each Topical Q0600   epoetin (EPOGEN/PROCRIT) injection  4,000 Units Intravenous Q M,W,F-HD   heparin  5,000 Units Subcutaneous Q8H   isosorbide mononitrate  60 mg Oral q morning   losartan  25 mg Oral QHS   metoprolol  200 mg Oral q morning   sodium chloride flush  3 mL Intravenous Q12H   torsemide  40 mg Oral Daily    Antimicrobials: Anti-infectives (From admission, onward)    Start     Dose/Rate Route Frequency Ordered Stop   09/22/20 1429  clindamycin (CLEOCIN) 300 MG/50ML IVPB       Note to Pharmacy: Delametter, Gretchen: cabinet override      09/22/20 1429 09/23/20 0244   09/21/20 0600  clindamycin (CLEOCIN) IVPB 300 mg        300 mg 100 mL/hr over 30 Minutes Intravenous On call to O.R. 09/20/20 1137 09/22/20 0559       PRN meds: sodium chloride, acetaminophen, haloperidol lactate, lidocaine-prilocaine, ondansetron (ZOFRAN) IV, sodium chloride flush   Objective: Vitals:   09/23/20 1600 09/23/20 1615  BP: (!) 148/75 (!) 150/73  Pulse: 76 77  Resp: 20 (!) 23  Temp:    SpO2: 94% 95%    Intake/Output Summary (Last 24 hours) at 09/23/2020 1632 Last data filed at 09/22/2020 2300 Gross per 24 hour  Intake 240 ml  Output --  Net 240 ml    Filed Weights   09/20/20 0330 09/21/20 0623 09/23/20 0421  Weight: 98.7 kg 97.2 kg 97.8 kg   Weight change:  Body mass index is 37.01 kg/m.   Physical Exam: General exam: Pleasant, elderly African-American female.  Not in physical distress Skin: No rashes, lesions or ulcers. HEENT: Atraumatic, normocephalic, no obvious bleeding Lungs: Clear to auscultation CVS: Regular rate and rhythm, no murmur GI/Abd soft, nontender, nondistended, bowel sound  present CNS: Alert, awake oriented x3 Psychiatry: Mood appropriate Extremities: Pedal edema 2+ bilaterally, no calf tenderness  Data Review: I have personally reviewed the laboratory data and studies available.  Recent Labs  Lab 09/18/20 0746 09/22/20 0933 09/23/20 0615  WBC 6.8 6.5 7.0  NEUTROABS  --  4.8 5.3  HGB 10.0* 8.9* 8.3*  HCT 32.9* 29.4* 27.7*  MCV 96.2 97.4 96.5  PLT 181 141* 131*    Recent Labs  Lab 09/19/20 0530 09/20/20 0624 09/21/20 0547 09/22/20 0933 09/23/20 0615  NA 143 139 138 134* 135  K 5.5* 5.4* 4.7 4.3 4.6  CL 110  104 101 100 101  CO2 28 25 28 27 28   GLUCOSE 142* 115* 78 97 104*  BUN 64* 49* 35* 37* 37*  CREATININE 6.41* 5.88* 4.55* 4.55* 4.61*  CALCIUM 7.7* 7.6* 7.5* 7.7* 7.7*     F/u labs ordered Unresulted Labs (From admission, onward)     Start     Ordered   09/22/20 6283  Basic metabolic panel  Daily,   R      09/21/20 1503   09/22/20 0500  CBC with Differential/Platelet  Daily,   R      09/21/20 1503   09/19/20 0550  QuantiFERON-TB Gold Plus  Once,   R        09/19/20 0550            Signed, Terrilee Croak, MD [facility] 09/23/2020

## 2020-09-23 NOTE — Care Management Important Message (Signed)
Important Message  Patient Details  Name: Anna Key MRN: [id number] Date of Birth: 03/03/1950   Medicare Important Message Given:  Yes     Dannette Barbara 09/23/2020, 1:28 PM

## 2020-09-23 NOTE — Progress Notes (Signed)
   09/23/20 0421  What Happened  Was fall witnessed? No  Was patient injured? No  Patient found on floor  Found by Staff-comment  Stated prior activity to/from bed, chair, or stretcher  Follow Up  MD notified Dr Sidney Ace  Time MD notified [phone]  Family notified No - patient refusal  Additional tests No  Simple treatment Other (comment) (Pt report no pain and skin intact)  Progress note created (see row info) Yes  Adult Fall Risk Assessment  Risk Factor Category (scoring not indicated) Fall has occurred during this admission (document High fall risk)  Patient Fall Risk Level High fall risk  Adult Fall Risk Interventions  Required Bundle Interventions *See Row Information* High fall risk - low, moderate, and high requirements implemented  Additional Interventions PT/OT need assessed if change in mobility from baseline;Use of appropriate toileting equipment (bedpan, BSC, etc.);Safety Sitter/Safety Rounder  Screening for Fall Injury Risk (To be completed on HIGH fall risk patients) - Assessing Need for Floor Mats  Risk For Fall Injury- Criteria for Floor Mats Noncompliant with safety precautions  Will Implement Floor Mats Yes  Vitals  Temp 99.8 F (37.7 C)  Temp Source Oral  BP 126/75  MAP (mmHg) 91  BP Location Right Arm  BP Method Automatic  Patient Position (if appropriate) Lying  Pulse Rate 73  Pulse Rate Source Dinamap  Resp 16  Oxygen Therapy  SpO2 95 %  O2 Device Nasal Cannula  O2 Flow Rate (L/min) 4 L/min  Pain Assessment  Pain Scale 0-10  Pain Score 0  Faces Pain Scale 0  PCA/Epidural/Spinal Assessment  Respiratory Pattern Regular;Unlabored  Neurological  Neuro (WDL) X  Level of Consciousness Alert  Orientation Level Oriented to person;Oriented to place;Oriented to time;Disoriented to situation  Cognition Appropriate at baseline  Speech Clear  Pupil Assessment  No  Motor Function/Sensation Assessment Grip  R Hand Grip Moderate  L Hand Grip Moderate   Neuro  Symptoms Forgetful  Neuro Additional Assessments No  Glasgow Coma Scale  Eye Opening 4  Best Verbal Response (NON-intubated) 5  Best Motor Response 6  Glasgow Coma Scale Score 15  Musculoskeletal  Musculoskeletal (WDL) X  Assistive Device None  Generalized Weakness Yes  Weight Bearing Restrictions No  Musculoskeletal Details  RUE Limited movement  LUE Limited movement  RLE Limited movement  LLE Limited movement  Integumentary  Integumentary (WDL) X  RN Assisting with Skin Assessment on Admission [place] CRN  Skin Color Appropriate for ethnicity  Skin Condition Dry  Skin Integrity Abrasion;Blister  Abrasion Location Abdomen;Arm  Abrasion Location Orientation Bilateral  Abrasion Intervention Other (Comment) (ASSESSED)  Description of Blister Serous  Blister Location Groin  Blister Location Orientation Bilateral  Blister Intervention Foam  Catheter Entry/Exit Location Groin  Catheter Entry/Exit Location Orientation Right  Catheter Entry/Exit Intervention Gauze  Skin Turgor Non-tenting

## 2020-09-23 NOTE — TOC Progression Note (Signed)
Transition of Care [facility]) - Progression Note    Patient Details  Name: Anna Key MRN: [id number] Date of Birth: [birth year]  [facility] CM/SW [place], [place] Phone Number: 09/23/2020, 9:41 AM  Clinical Narrative:     Tommi Rumps with [facility] has accepted patient for home health PT and OT, pending medical readiness to dc.   Patient will need walker at dc, pending DME orders for walker to be delivered to room by [employer].   Expected Discharge Plan: [place] Barriers to Discharge: Continued Medical Work up  Expected Discharge Plan and Services Expected Discharge Plan: [place]: [place] arrangements for the past 2 months: Single Family Home                           HH Arranged: PT, OT, RN   Date HH Agency Contacted: 09/22/20 Time HH Agency Contacted: 1212     Social Determinants of Health (SDOH) Interventions    Readmission Risk Interventions No flowsheet data found.

## 2020-09-23 NOTE — Progress Notes (Signed)
Post hd vitals 

## 2020-09-23 NOTE — Progress Notes (Signed)
[facility] Daily Progress Note  09/22/20: 1.   Left brachiocephalic arteriovenous fistula cannulation under ultrasound guidance 2.   Left arm fistulagram including central venogram  Subjective: Patient sleeping this AM.  Without complaint.  No acute issues overnight with the exception of a fall which the patient seems to be uninjured.  Objective: Vitals:   09/23/20 0358 09/23/20 0421 09/23/20 0549 09/23/20 0727  BP: 126/75 126/75 118/69 122/70  Pulse: 73 73 69 70  Resp:  16 16 17   Temp:  99.8 F (37.7 C) 99.4 F (37.4 C) 98.5 F (36.9 C)  TempSrc:  Oral  Oral  SpO2: 95% 95% 94% 94%  Weight:  97.8 kg    Height:        Intake/Output Summary (Last 24 hours) at 09/23/2020 1030 Last data filed at 09/22/2020 2300 Gross per 24 hour  Intake 240 ml  Output -77 ml  Net 317 ml   Physical Exam: A&Ox3, NAD CV: RRR Pulmonary: CTA Bilaterally Abdomen: Soft, Nontender, Nondistended Vascular:  Left upper extremity: Large amount of ecchymosis noted however the arm is soft.  Good bruit and thrill in dialysis access.  Hand is warm.  Motor/sensory function is intact.   Laboratory: CBC    Component Value Date/Time   WBC 7.0 09/23/2020 0615   HGB 8.3 (L) 09/23/2020 0615   HCT 27.7 (L) 09/23/2020 0615   PLT 131 (L) 09/23/2020 0615   BMET    Component Value Date/Time   NA 135 09/23/2020 0615   NA 140 08/29/2011 1925   K 4.6 09/23/2020 0615   K 3.5 08/29/2011 1925   CL 101 09/23/2020 0615   CL 104 08/29/2011 1925   CO2 28 09/23/2020 0615   CO2 25 08/29/2011 1925   GLUCOSE 104 (H) 09/23/2020 0615   GLUCOSE 79 08/29/2011 1925   BUN 37 (H) 09/23/2020 0615   BUN 25 (H) 08/29/2011 1925   CREATININE 4.61 (H) 09/23/2020 0615   CREATININE 1.60 (H) 08/29/2011 1925   CALCIUM 7.7 (L) 09/23/2020 0615   CALCIUM 9.2 08/29/2011 1925   GFRNONAA 10 (L) 09/23/2020 0615   GFRNONAA 35 (L) 08/29/2011 1925   GFRAA 9 (L) 11/12/2019 1001   GFRAA 40 (L) 08/29/2011 1925    Assessment/Planning: The patient is a 70 year old female with multiple medical issues including known end-stage renal disease with a left upper extremity dialysis access but is not functioning correctly status post fistulogram  1) Fistulogram findings: "Left brachiocephalic AV fistula was widely patent without any significant stenosis.  It had very mild tortuosity and was not particularly deep on ultrasound". Based on fistulogram findings the patient's access should be accessible, if staff continue to have issues may need to revisit the idea of possible revision or creation of a new access if unable to use. 2) left brachiocephalic AV fistula was created on November 27, 2019 3) if the patient is able to dialyze via her left upper extremity fistula vascular surgery will sign off. 4) would like to see the patient back in a month for continued follow-up  Discussed with Dr. Ellis Parents Evadne Ose PA-C 09/23/2020 10:30 AM

## 2020-09-23 NOTE — Progress Notes (Addendum)
Pre HD RN assessment; pt in HD chair for treatment.

## 2020-09-23 NOTE — Progress Notes (Signed)
Pt talking with Candiss Norse, MD. She is thinking about ending HD treatment early AMA. Attempting to get out of HD chair and sit up. Mylinda Latina, HD tech is sitting with patient and attempting to engage patient in conversation.

## 2020-09-23 NOTE — Progress Notes (Signed)
[place], [place] 09/23/20  Subjective:   Hospital day # 5  Patient known to our practice from outpatient follow-up.  She has history of advanced CKD, diastolic CHF, hypertension and dyslipidemia.  She presents to the emergency room for worsening shortness of breath and lower extremity edema for the past 2 weeks or so.    Patient seen laying in bed Alert and able to answer orientation questions Per nursing, patient had a fall without injury overnight Patient states she does not want to wait for staff to take her to the bathroom Tolerating meals Denies nausea and vomiting Remains on 4L  Patient seen later during dialysis in chair   HEMODIALYSIS FLOWSHEET:  Blood Flow Rate (mL/min): 400 mL/min Arterial Pressure (mmHg): -140 mmHg Venous Pressure (mmHg): 130 mmHg Transmembrane Pressure (mmHg): 70 mmHg Ultrafiltration Rate (mL/min): 830 mL/min Dialysate Flow Rate (mL/min): 500 ml/min Conductivity: Machine : 13.8 Conductivity: Machine : 13.8 Dialysis Fluid Bolus: Normal Saline Bolus Amount (mL): 300 mL    Objective:  Vital signs in last 24 hours:  Temp:  [97.9 F (36.6 C)-99.8 F (37.7 C)] 98.6 F (37 C) (08/12 1500) Pulse Rate:  [61-82] 73 (08/12 1545) Resp:  [13-24] 20 (08/12 1545) BP: (107-180)/(66-83) 143/72 (08/12 1545) SpO2:  [90 %-95 %] 93 % (08/12 1545) Weight:  [97.8 kg] 97.8 kg (08/12 0421)  Weight change:  Filed Weights   09/20/20 0330 09/21/20 0623 09/23/20 0421  Weight: 98.7 kg 97.2 kg 97.8 kg    Intake/Output:    Intake/Output Summary (Last 24 hours) at 09/23/2020 1600 Last data filed at 09/22/2020 2300 Gross per 24 hour  Intake 240 ml  Output --  Net 240 ml     Physical Exam: General:  No acute distress, laying in bed  HEENT  anicteric, moist oral mucous membrane  Pulm/lungs  normal breathing effort, b/l crackles (improving)  CVS/Heart  regular rhythm, no rub or gallop  Abdomen:   Soft, nontender  Extremities:   ++ peripheral edema  Neurologic:  Alert, able to follow commands  Skin:  No acute rashes  Access: Rt femoral temp cath  Basic Metabolic Panel:  Recent Labs  Lab 09/19/20 0530 09/20/20 0624 09/21/20 0547 09/22/20 0933 09/23/20 0615  NA 143 139 138 134* 135  K 5.5* 5.4* 4.7 4.3 4.6  CL 110 104 101 100 101  CO2 28 25 28 27 28   GLUCOSE 142* 115* 78 97 104*  BUN 64* 49* 35* 37* 37*  CREATININE 6.41* 5.88* 4.55* 4.55* 4.61*  CALCIUM 7.7* 7.6* 7.5* 7.7* 7.7*      CBC: Recent Labs  Lab 09/18/20 0746 09/22/20 0933 09/23/20 0615  WBC 6.8 6.5 7.0  NEUTROABS  --  4.8 5.3  HGB 10.0* 8.9* 8.3*  HCT 32.9* 29.4* 27.7*  MCV 96.2 97.4 96.5  PLT 181 141* 131*       Lab Results  Component Value Date   HEPBSAG NON REACTIVE 09/18/2020   HEPBSAG NON REACTIVE 09/18/2020   HEPBSAB NON REACTIVE 09/18/2020      Microbiology:  Recent Results (from the past 240 hour(s))  Resp Panel by RT-PCR (Flu A&B, Covid) Nasopharyngeal Swab     Status: None   Collection Time: 09/18/20 12:19 PM   Specimen: Nasopharyngeal Swab; Nasopharyngeal(NP) swabs in vial transport medium  Result Value Ref Range Status   SARS Coronavirus 2 by RT PCR NEGATIVE NEGATIVE Final    Comment: (NOTE) SARS-CoV-2 target nucleic acids are NOT DETECTED.  The SARS-CoV-2 RNA is generally  detectable in upper respiratory specimens during the acute phase of infection. The lowest concentration of SARS-CoV-2 viral copies this assay can detect is 138 copies/mL. A negative result does not preclude SARS-Cov-2 infection and should not be used as the sole basis for treatment or other patient management decisions. A negative result may occur with  improper specimen collection/handling, submission of specimen other than nasopharyngeal swab, presence of viral mutation(s) within the areas targeted by this assay, and inadequate number of viral copies(<138 copies/mL). A negative result must be combined with clinical observations,  patient history, and epidemiological information. The expected result is Negative.  Fact Sheet for Patients:  EntrepreneurPulse.com.au  Fact Sheet for Healthcare Providers:  IncredibleEmployment.be  This test is no t yet approved or cleared by the [place] FDA and  has been authorized for detection and/or diagnosis of SARS-CoV-2 by FDA under an Emergency Use Authorization (EUA). This EUA will remain  in effect (meaning this test can be used) for the duration of the COVID-19 declaration under Section 564(b)(1) of the Act, 21 U.S.C.section 360bbb-3(b)(1), unless the authorization is terminated  or revoked sooner.       Influenza A by PCR NEGATIVE NEGATIVE Final   Influenza B by PCR NEGATIVE NEGATIVE Final    Comment: (NOTE) The Xpert Xpress SARS-CoV-2/FLU/RSV plus assay is intended as an aid in the diagnosis of influenza from Nasopharyngeal swab specimens and should not be used as a sole basis for treatment. Nasal washings and aspirates are unacceptable for Xpert Xpress SARS-CoV-2/FLU/RSV testing.  Fact Sheet for Patients: EntrepreneurPulse.com.au  Fact Sheet for Healthcare Providers: IncredibleEmployment.be  This test is not yet approved or cleared by the [place] FDA and has been authorized for detection and/or diagnosis of SARS-CoV-2 by FDA under an Emergency Use Authorization (EUA). This EUA will remain in effect (meaning this test can be used) for the duration of the COVID-19 declaration under Section 564(b)(1) of the Act, 21 U.S.C. section 360bbb-3(b)(1), unless the authorization is terminated or revoked.  Performed at [facility], [address] [place], [place] [address]   MRSA Next Gen by PCR, Nasal     Status: None   Collection Time: 09/20/20  9:32 PM   Specimen: Nasal Mucosa; Nasal Swab  Result Value Ref Range Status   MRSA by PCR Next Gen NOT DETECTED NOT DETECTED  Final    Comment: (NOTE) The GeneXpert MRSA Assay (FDA approved for NASAL specimens only), is one component of a comprehensive MRSA colonization surveillance program. It is not intended to diagnose MRSA infection nor to guide or monitor treatment for MRSA infections. Test performance is not FDA approved in patients less than 57 years old. Performed at [facility], [address] [place], [place] [address]     Coagulation Studies: No results for input(s): LABPROT, INR in the last 72 hours.  Urinalysis: No results for input(s): COLORURINE, LABSPEC, PHURINE, GLUCOSEU, HGBUR, BILIRUBINUR, KETONESUR, PROTEINUR, UROBILINOGEN, NITRITE, LEUKOCYTESUR in the last 72 hours.  Invalid input(s): APPERANCEUR    Imaging: PERIPHERAL VASCULAR CATHETERIZATION  Result Date: 09/22/2020 See surgical note for result.    Medications:    sodium chloride      allopurinol  100 mg Oral q morning   amLODipine  10 mg Oral q morning   aspirin EC  325 mg Oral Daily   atorvastatin  40 mg Oral q morning   calcitRIOL  0.25 mcg Oral Daily   calcium carbonate  400 mg of elemental calcium Oral Q breakfast   Chlorhexidine  Gluconate Cloth  6 each Topical Q0600   heparin  5,000 Units Subcutaneous Q8H   isosorbide mononitrate  60 mg Oral q morning   losartan  25 mg Oral QHS   metoprolol  200 mg Oral q morning   sodium chloride flush  3 mL Intravenous Q12H   torsemide  40 mg Oral Daily   sodium chloride, acetaminophen, haloperidol lactate, lidocaine-prilocaine, ondansetron (ZOFRAN) IV, sodium chloride flush  Assessment/ Plan:  70 y.o. female with advanced chronic kidney disease, hypertension, osteoarthritis with history of left knee replacement 2021, gout, chronic hep C treated in 2020, cirrhosis, past history of alcohol use, LVH, diastolic dysfunction, current smoker, coronary disease with history of angioplasty admitted on 09/18/2020 for Acute pulmonary edema ([place]) [J81.0] DVT (deep venous  thrombosis) (HCC) [I82.409] Acute respiratory failure with hypoxia (HCC) [V85.92] Acute diastolic CHF (congestive heart failure) (HCC) [I50.31] Stage 5 chronic kidney disease not on chronic dialysis ([place]) [N18.5]  # End-stage renal disease. # Volume overload Dialysis initiated on 09/19/20. Vascular performed angiogram on 09/22/20, negative for stenosis Receiving dialysis in the chair. Tolerated well. Will order temp cath to be d/c'd after dialysis today. Dialysis coordinator has confirmed outpatient clinic at [facility] with a start date next Tuesday. Torsemide 40mg  daily Low dose losartan 25mg  daily for CV risk reduction   #Hyperkalemia 4.6 Improved with diuretics and dialysis  #Anemia of chronic kidney disease Lab Results  Component Value Date   HGB 8.3 (L) 09/23/2020  Hgb slightly below target Will order 4000 units EPO with treatment  #Secondary hyperparathyroidism of renal origin Lab Results  Component Value Date   CALCIUM 7.7 (L) 09/23/2020  Continue calcitriol Calcium supplements with breakfast  #Hypoxia believed related to sleep apnea Confusion prior to initial angiogram on 09/19/20, aborted and rescheduled for 09/22/20 Bipap nightly Improved mentation today   LOS: [place] 8/12/20224:00 PM  [place], [place]

## 2020-09-23 NOTE — Progress Notes (Signed)
HD end at 1757. Pt met uf goal of 2L. Restless last hour of HD treatment. Attempting to sit up and get out of HD chair. MD present and aware. Dialysis tech to remain with patient for last hour of tx. Patient ate graham crackers, remained calm and in HD chair and talked with dialysis tech for last hour. Received full HD treatment. Alert, stable and report to primary RN. Room air and 99% when departed from HD unit. Ccmd notified of pt departure. Catheter limbs locked with 1.67m heparin each.

## 2020-09-23 NOTE — Progress Notes (Signed)
Pt tx initiated without incident using femoral catheter, uf goal is 2.0KG. AVF will not be used today due to prior infiltration of the access, she denies any discomfort within the avf infiltrated area. Pt will be monitored closely for any changes in symptoms during the treatment.

## 2020-09-23 NOTE — Progress Notes (Signed)
PT Cancellation Note  Patient Details Name: Anna Key MRN: [id number] DOB: October 04, 1950   Cancelled Treatment:    Reason Eval/Treat Not Completed: Other (comment). Per RN pt pending HD today after breakfast, and temp cath still in place. PT to re-attempt as able.  Lieutenant Diego PT, DPT 9:20 AM,09/23/20

## 2020-09-23 NOTE — Progress Notes (Signed)
Patient is tolerating treatment well, she has been very pleasant to work with.

## 2020-09-23 NOTE — Progress Notes (Addendum)
   09/23/20 0421  Assess: MEWS Score  Temp 99.8 F (37.7 C)  BP 126/75  Pulse Rate 73  Resp 16  Level of Consciousness Alert  SpO2 95 %  O2 Device Nasal Cannula  O2 Flow Rate (L/min) 4 L/min  Assess: MEWS Score  MEWS Temp 0  MEWS Systolic 0  MEWS Pulse 0  MEWS RR 0  MEWS LOC 0  MEWS Score 0  MEWS Score Color Green  Treat  Pain Scale 0-10  Pain Score 0  Assess: SIRS CRITERIA  SIRS Temperature  0  SIRS Pulse 0  SIRS Respirations  0  SIRS WBC 0  SIRS Score Sum  0  Pt was found sitting on the floor after bed alarm went off. VS is done and MD notified. Use lift to help patient back to the bed. Skin intact and no report pain by patient. Alert and Orient X 3 with forgetful which is the patient baseline at night. Will check another VS in 2 hour and keep monitoring.

## 2020-09-24 LAB — CBC WITH DIFFERENTIAL/PLATELET
Abs Immature Granulocytes: 0.04 10*3/uL (ref 0.00–0.07)
Basophils Absolute: 0 10*3/uL (ref 0.0–0.1)
Basophils Relative: 0 %
Eosinophils Absolute: 0.1 10*3/uL (ref 0.0–0.5)
Eosinophils Relative: 1 %
HCT: 31.2 % — ABNORMAL LOW (ref 36.0–46.0)
Hemoglobin: 9.4 g/dL — ABNORMAL LOW (ref 12.0–15.0)
Immature Granulocytes: 1 %
Lymphocytes Relative: 12 %
Lymphs Abs: 0.8 10*3/uL (ref 0.7–4.0)
MCH: 29.5 pg (ref 26.0–34.0)
MCHC: 30.1 g/dL (ref 30.0–36.0)
MCV: 97.8 fL (ref 80.0–100.0)
Monocytes Absolute: 0.7 10*3/uL (ref 0.1–1.0)
Monocytes Relative: 9 %
Neutro Abs: 5.4 10*3/uL (ref 1.7–7.7)
Neutrophils Relative %: 77 %
Platelets: 126 10*3/uL — ABNORMAL LOW (ref 150–400)
RBC: 3.19 MIL/uL — ABNORMAL LOW (ref 3.87–5.11)
RDW: 22.5 % — ABNORMAL HIGH (ref 11.5–15.5)
Smear Review: NORMAL
WBC: 7 10*3/uL (ref 4.0–10.5)
nRBC: 0.4 % — ABNORMAL HIGH (ref 0.0–0.2)

## 2020-09-24 LAB — BASIC METABOLIC PANEL
Anion gap: 8 (ref 5–15)
BUN: 25 mg/dL — ABNORMAL HIGH (ref 8–23)
CO2: 27 mmol/L (ref 22–32)
Calcium: 7.8 mg/dL — ABNORMAL LOW (ref 8.9–10.3)
Chloride: 100 mmol/L (ref 98–111)
Creatinine, Ser: 3.48 mg/dL — ABNORMAL HIGH (ref 0.44–1.00)
GFR, Estimated: 14 mL/min — ABNORMAL LOW (ref >60)
Glucose, Bld: 88 mg/dL (ref 70–99)
Potassium: 3.9 mmol/L (ref 3.5–5.1)
Sodium: 135 mmol/L (ref 135–145)

## 2020-09-24 MED ORDER — METOPROLOL SUCCINATE ER 200 MG PO TB24: 200.0000 mg | ORAL_TABLET | Freq: Every morning | ORAL | 2 refills | Status: DC

## 2020-09-24 MED ORDER — ISOSORBIDE MONONITRATE ER 60 MG PO TB24: 60.0000 mg | ORAL_TABLET | Freq: Every morning | ORAL | 2 refills | Status: DC

## 2020-09-24 MED ORDER — ASPIRIN 325 MG PO TBEC: 325.0000 mg | DELAYED_RELEASE_TABLET | Freq: Every day | ORAL | 0 refills | Status: DC

## 2020-09-24 MED ORDER — TORSEMIDE 40 MG PO TABS: 40.0000 mg | ORAL_TABLET | Freq: Every day | ORAL | 2 refills | Status: DC

## 2020-09-24 MED ORDER — LOSARTAN POTASSIUM 25 MG PO TABS: 25.0000 mg | ORAL_TABLET | Freq: Every day | ORAL | 2 refills | Status: DC

## 2020-09-24 MED ORDER — AMLODIPINE BESYLATE 10 MG PO TABS: 10.0000 mg | ORAL_TABLET | Freq: Every day | ORAL | 2 refills | Status: DC

## 2020-09-24 NOTE — Progress Notes (Signed)
Physical Therapy Treatment Patient Details Name: KEYSHIA ORWICK MRN: [id number] DOB: 12-Jan-1951 Today's Date: 09/24/2020    History of Present Illness Pt is a 70 y.o. F presenting to ED for SOB and LE edema leading to an inability to amb. Pt has PMH for stage V CKD, chronic diastolic dysfunction, HTN, CAD (angioplasty 09/2020), L TKA, R PKA.    PT Comments    Patient alert, agreeable to PT with motivation, very focused on wanting to go home. Sitting EOB at start and end of session. Time spent educating patient on importance of using oxygen, RW, and activity modification to maximize pt safety at discharge. She was able to transfer with CGA/supervision, and ambulated a total of 39ft. Improved cadence and stability noted with walker. Pt also able to use standard commode and perform pericare with supervision. The patient would benefit from further skilled PT intervention to continue to progress towards goals. Recommendation changed to HHPT due to decreased patient endurance, and need for RW (pt initially claimed to have a RW at home).      Follow Up Recommendations  Home health PT     Equipment Recommendations  Rolling walker with 5" wheels    Recommendations for Other Services       Precautions / Restrictions Precautions Precautions: Fall Restrictions Weight Bearing Restrictions: No    Mobility  Bed Mobility               General bed mobility comments: pt sitting EOB at start of session    Transfers Overall transfer level: Needs assistance Equipment used: Rolling walker (2 wheeled);None Transfers: Sit to/from Stand Sit to Stand: Supervision;Min guard         General transfer comment: CGA without AD, supervision with RW and cues for hand placement with poor learning carryover  Ambulation/Gait Ambulation/Gait assistance: Supervision;Min guard Gait Distance (Feet):  (70 total, seated rest break after ~41ft)     Gait velocity: decreased       Stairs              Wheelchair Mobility    Modified Rankin (Stroke Patients Only)       Balance Overall balance assessment: Needs assistance Sitting-balance support: Feet supported Sitting balance-Leahy Scale: Good Sitting balance - Comments: able to perform pericare modI                                    Cognition Arousal/Alertness: Awake/alert Behavior During Therapy: WFL for tasks assessed/performed Overall Cognitive Status: No family/caregiver present to determine baseline cognitive functioning                                 General Comments: a little impulsive but behavior WFLs      Exercises Other Exercises Other Exercises: able to use standard commode with grab bar, pericare supervision as well as washing her hands    General Comments        Pertinent Vitals/Pain Pain Assessment: No/denies pain    Home Living                      Prior Function            PT Goals (current goals can now be found in the care plan section) Progress towards PT goals: Progressing toward goals    Frequency    Min 2X/week  PT Plan Current plan remains appropriate;Discharge plan needs to be updated    Co-evaluation              AM-PAC PT "6 Clicks" Mobility   Outcome Measure  Help needed turning from your back to your side while in a flat bed without using bedrails?: None Help needed moving from lying on your back to sitting on the side of a flat bed without using bedrails?: None Help needed moving to and from a bed to a chair (including a wheelchair)?: None Help needed standing up from a chair using your arms (e.g., wheelchair or bedside chair)?: None Help needed to walk in hospital room?: None Help needed climbing 3-5 steps with a railing? : A Little 6 Click Score: 23    End of Session Equipment Utilized During Treatment: Gait belt;Oxygen (4L) Activity Tolerance: Patient tolerated treatment well Patient left: in bed;with  call bell/phone within reach;with bed alarm set;with nursing/sitter in room (seated EOB like she was at start of session) Nurse Communication: Mobility status PT Visit Diagnosis: Muscle weakness (generalized) (M62.81)     Time: 3794-4461 PT Time Calculation (min) (ACUTE ONLY): 23 min  Charges:  $Therapeutic Exercise: 8-22 mins $Therapeutic Activity: 8-22 mins                     Lieutenant Diego PT, DPT 2:33 PM,09/24/20

## 2020-09-24 NOTE — Discharge Summary (Signed)
Physician Discharge Summary  Anna Key:[id number] DOB: 1950/04/01 DOA: 09/18/2020  PCP: Kirk Ruths, MD  Admit date: 09/18/2020 Discharge date: 09/24/2020  Admitted From: Home Discharge disposition: Home with home health and oxygen   Code Status: Full Code   Discharge Diagnosis:   Principal Problem:   Acute diastolic CHF (congestive heart failure) ([place]) Active Problems:   Essential hypertension   CAD (coronary artery disease)   Tobacco use   Chronic kidney disease (CKD), stage V ([place])   Anemia in chronic kidney disease   Cigarette smoker   Hyperkalemia     Chief Complaint  Patient presents with   Leg Swelling   Shortness of Breath    Brief narrative: Anna Key is a 70 y.o. female with PMH significant for HTN, HLD, CKD 5, chronic diastolic CHF, noncompliance to medications. Patient presented to the ED on 8/7 with complaint of shortness of breath and lower extremity swelling which has been progressively worsening for the last couple of weeks   In the ED, creatinine was elevated to 6.05, BNP was elevated to more than 1500. Chest x-ray showed cardiomegaly, central pulmonary vascular congestion and new small bilateral pleural effusions.  Patient was admitted to hospitalist service. Nephrology consultation was obtained. 8/9, temporary catheter was placed and patient was started on dialysis. See below for details  Subjective: Patient was seen and examined this morning. Lying down in bed.  Not in distress no new symptoms.  Wants to go home.  Hospital course: Acute volume overload and uremia ESRD -Presented with shortness of breath, pedal edema and worsening CKD -Patient's CKD has progressed to ESRD. -Nephrology consultation was obtained. -8/9 temporary dialysis catheter was placed and patient was started on dialysis.  She was also aggressively diuresed with IV Lasix.  Currently on torsemide 40 mg daily. -Patient has a functioning left arm AV  fistula. No stenosis was noted on the fistulogram done on 8/11. -Arranged for outpatient dialysis clinic arrangement at [facility] in [facility] to start on Tuesday 8/16.  Acute diastolic dysfunction CHF Essential hypertension -Echo from 2018 showed an EF 60 to 65% with grade 2 diastolic dysfunction. -Worsening of volume overload and CKD probably due to dietary indiscretion and noncompliance to diuretics. -Continue nitrate, beta-blocker, amlodipine.  Torsemide and losartan have been added as well.  Acute respiratory failure with hypoxia -Oxygen dependent, on 4 L/min at rest at this time.  Discharged with supplemental oxygen.Marland Kitchen  Hyperkalemia -Corrected after dialysis was initiated. -ACE inhibitor has been on hold.  Blood pressure remained stable. Recent Labs  Lab 09/20/20 0624 09/21/20 0547 09/22/20 0933 09/23/20 0615 09/24/20 0713  K 5.4* 4.7 4.3 4.6 3.9   Nicotine dependence -Smoking cessation counseling was done during her hospitalization -Continue nicotine patch    Anemia of chronic disease -Hemoglobin stable. Recent Labs    05/16/20 1329 08/26/20 1341 09/18/20 0746 09/22/20 0933 09/23/20 0615 09/24/20 0713  HGB 7.7* 8.9* 10.0* 8.9* 8.3* 9.4*  MCV 84.2 88.6 96.2 97.4 96.5 97.8  VITAMINB12 393  --   --   --   --   --   FOLATE 14.3  --   --   --   --   --   FERRITIN 12 46  --   --   --   --   TIBC 448 286  --   --   --   --   IRON 25* 39  --   --   --   --   RETICCTPCT  3.4*  --   --   --   --   --    Impaired mobility -To be evaluated by PT after temper dialysis catheter is removed today.   Allergies as of 09/24/2020       Reactions   Penicillins Hives, Rash   Has patient had a PCN reaction causing immediate rash, facial/tongue/throat swelling, SOB or lightheadedness with hypotension: Yes Has patient had a PCN reaction causing severe rash involving mucus membranes or skin necrosis: Yes--all over body Has patient had a PCN reaction that required  hospitalization: No  Has patient had a PCN reaction occurring within the last 10 years: No If all of the above answers are "NO", then may proceed with Cephalosporin use.        Medication List     STOP taking these medications    lisinopril 30 MG tablet Commonly known as: ZESTRIL   sodium bicarbonate 650 MG tablet       TAKE these medications    allopurinol 100 MG tablet Commonly known as: ZYLOPRIM Take 100 mg by mouth every morning.   amLODipine 10 MG tablet Commonly known as: NORVASC Take 1 tablet (10 mg total) by mouth daily.   aspirin 325 MG EC tablet Take 1 tablet (325 mg total) by mouth daily. Start taking on: September 25, 2020   atorvastatin 40 MG tablet Commonly known as: LIPITOR Take 40 mg by mouth every morning.   calcitRIOL 0.25 MCG capsule Commonly known as: ROCALTROL Take 0.25 mcg by mouth daily.   Calcium 500-100 MG-UNIT Chew Chew 500 mg by mouth.   isosorbide mononitrate 60 MG 24 hr tablet Commonly known as: IMDUR Take 1 tablet (60 mg total) by mouth every morning.   lidocaine-prilocaine cream Commonly known as: EMLA Apply topically.   losartan 25 MG tablet Commonly known as: COZAAR Take 1 tablet (25 mg total) by mouth at bedtime.   metoprolol 200 MG 24 hr tablet Commonly known as: TOPROL-XL Take 1 tablet (200 mg total) by mouth every morning.   polyethylene glycol-electrolytes 420 g solution Commonly known as: NuLYTELY Take by mouth.   Torsemide 40 MG Tabs Take 40 mg by mouth daily. Start taking on: September 25, 2020 What changed:  medication strength how much to take when to take this               Durable Medical Equipment  (From admission, onward)           Start     Ordered   09/24/20 1251  For home use only DME oxygen  Once       Question Answer Comment  Length of Need Lifetime   Mode or (Route) Nasal cannula   Liters per Minute 4   Frequency Continuous (stationary and portable oxygen unit needed)   Oxygen  conserving device Yes   Oxygen delivery system Gas      09/24/20 1251            Discharge Instructions:  Diet Recommendation: Cardiac diet   Follow with Primary MD Kirk Ruths, MD in 7 days   Get CBC/BMP checked in next visit within 1 week by PCP or SNF MD ( we routinely change or add medications that can affect your baseline labs and fluid status, therefore we recommend that you get the mentioned basic workup next visit with your PCP, your PCP may decide not to get them or add new tests based on their clinical decision)  On your next visit  with your PCP, please Get Medicines reviewed and adjusted.  Please request your PCP  to go over all Hospital Tests and Procedure/Radiological results at the follow up, please get all Hospital records sent to your Prim MD by signing hospital release before you go home.  Activity: As tolerated with Full fall precautions use walker/cane & assistance as needed  For Heart failure patients - Check your Weight same time everyday, if you gain over 2 pounds, or you develop in leg swelling, experience more shortness of breath or chest pain, call your Primary MD immediately. Follow Cardiac Low Salt Diet and 1.5 lit/day fluid restriction.  If you have smoked or chewed Tobacco in the last 2 yrs please stop smoking, stop any regular Alcohol  and or any Recreational drug use.  If you experience worsening of your admission symptoms, develop shortness of breath, life threatening emergency, suicidal or homicidal thoughts you must seek medical attention immediately by calling 911 or calling your MD immediately  if symptoms less severe.  You Must read complete instructions/literature along with all the possible adverse reactions/side effects for all the Medicines you take and that have been prescribed to you. Take any new Medicines after you have completely understood and accpet all the possible adverse reactions/side effects.   Do not drive, operate heavy  machinery, perform activities at heights, swimming or participation in water activities or provide baby sitting services if your were admitted for syncope or siezures until you have seen by Primary MD or a Neurologist and advised to do so again.  Do not drive when taking Pain medications.  Do not take more than prescribed Pain, Sleep and Anxiety Medications  Wear Seat belts while driving.   Please note You were cared for by a hospitalist during your hospital stay. If you have any questions about your discharge medications or the care you received while you were in the hospital after you are discharged, you can call the unit and asked to speak with the hospitalist on call if the hospitalist that took care of you is not available. Once you are discharged, your primary care physician will handle any further medical issues. Please note that NO REFILLS for any discharge medications will be authorized once you are discharged, as it is imperative that you return to your primary care physician (or establish a relationship with a primary care physician if you do not have one) for your aftercare needs so that they can reassess your need for medications and monitor your lab values.    Follow ups:    Follow-up Information     Schnier, Dolores Lory, MD Follow up in 1 month(s).   Specialties: Vascular Surgery, Cardiology, Radiology, Vascular Surgery Why: Can see Schnier or Arna Medici. Will need HDA with visit. Contact information: [place] [address] [phone]         Kirk Ruths, MD Follow up.   Specialty: Internal Medicine Contact information: [address] [place] [address] [phone]                 Wound care:   Incision (Closed) 06/11/17 Knee Right (Active)  Date First Assessed/Time First Assessed: 06/11/17 0918   Location: Knee  Location Orientation: Right    Assessments 06/11/2017 10:09 AM 06/12/2017  8:30 AM   Dressing Type -- Honeycomb  Dressing Clean;Dry;Intact Clean;Dry;Intact;Changed  Dressing Change Frequency -- PRN  Site / Wound Assessment Dressing in place / Unable to assess Clean;Dry  Drainage  Amount -- Scant  Treatment Other (Comment) Ice applied     No Linked orders to display     Incision - 4 Ports Abdomen Right;Upper;Lateral Right;Medial;Lateral Right;Lower;Lateral Umbilicus (Active)  Placement Date/Time: 08/12/19 1307   Location of Ports: Abdomen  Location Orientation: Right;Upper;Lateral  Location Orientation: Right;Medial;Lateral  Location Orientation: Right;Lower;Lateral  Location Orientation: Umbilicus    Assessments 08/12/2019  1:58 PM 08/12/2019  3:25 PM  Port 1 Drainage Amount -- None  Port 1 Dressing Type Liquid skin adhesive Liquid skin adhesive  Port 2 Drainage Amount -- None  Port 2 Dressing Type Liquid skin adhesive Liquid skin adhesive  Port 3 Drainage Amount -- None  Port 3 Dressing Type Liquid skin adhesive Liquid skin adhesive  Port 4 Drainage Amount -- None  Port 4 Dressing Type Liquid skin adhesive Liquid skin adhesive     No Linked orders to display     Incision (Closed) 11/27/19 Arm Left (Active)  Date First Assessed/Time First Assessed: 11/27/19 0902   Location: Arm  Location Orientation: Left    Assessments 11/27/2019  9:16 AM 11/27/2019 10:15 AM  Dressing Type Liquid skin adhesive Liquid skin adhesive  Dressing Clean;Dry;Intact Clean;Dry;Intact     No Linked orders to display    Discharge Exam:   Vitals:   09/24/20 0648 09/24/20 0801 09/24/20 1139 09/24/20 1140  BP:  (!) 141/80 137/80   Pulse:  84 72 72  Resp:  17 16   Temp:  98.2 F (36.8 C) 98.7 F (37.1 C)   TempSrc:   Oral   SpO2:  96% (!) 89% 91%  Weight: 94 kg     Height:        Body mass index is 35.57 kg/m.  General exam: Pleasant, elderly Caucasian female Skin: No rashes, lesions or ulcers. HEENT: Atraumatic, normocephalic, no obvious bleeding Lungs: Clear to auscultation  bilaterally.  CVS: Regular rate and rhythm, no murmur GI/Abd soft, nontender, nondistended, bowel sound present CNS: Alert, awake, oriented x3 Psychiatry: Mood appropriate Extremities: Pedal edema 1+ bilaterally  Time coordinating discharge: 35 minutes   The results of significant diagnostics from this hospitalization (including imaging, microbiology, ancillary and laboratory) are listed below for reference.    Procedures and Diagnostic Studies:   PERIPHERAL VASCULAR CATHETERIZATION  Result Date: 09/20/2020 See surgical note for result.  ECHOCARDIOGRAM COMPLETE  Result Date: 09/20/2020    ECHOCARDIOGRAM REPORT   Patient Name:   Anna Key Date of Exam: 09/20/2020 Medical Rec #:  [id number]          Height:       64.0 in Accession #:    [id number]         Weight:       217.5 lb Date of Birth:  1950-03-25          BSA:          2.027 m Patient Age:    63 years           BP:           131/79 mmHg Patient Gender: F                  HR:           67 bpm. Exam Location:  [facility] Procedure: 2D Echo, Color Doppler, Cardiac Doppler, Strain Analysis and            Intracardiac Opacification Agent Indications:     I50.31 congestive heart failure-Acute Diastolic  History:  Patient has no prior history of Echocardiogram examinations.                  CHF, CKD; Risk Factors:Hypertension and Dyslipidemia. History                  of hepatitis C.  Sonographer:     Charmayne Sheer RDCS (AE) Referring Phys:  [id number] Collier Bullock Diagnosing Phys: Kate Sable MD  Sonographer Comments: Suboptimal parasternal window. Image acquisition challenging due to patient body habitus. Global longitudinal strain was attempted. IMPRESSIONS  1. Left ventricular ejection fraction, by estimation, is 70 to 75%. The left ventricle has hyperdynamic function. The left ventricle has no regional wall motion abnormalities. There is mild left ventricular hypertrophy. Left ventricular diastolic parameters are consistent with Grade  II diastolic dysfunction (pseudonormalization). The average left ventricular global longitudinal strain is -17.6 %. The global longitudinal strain is normal.  2. Right ventricular systolic function is normal. The right ventricular size is normal. There is severely elevated pulmonary artery systolic pressure. The estimated right ventricular systolic pressure is 83.3 mmHg.  3. The mitral valve is degenerative. Mild mitral valve regurgitation.  4. The aortic valve was not well visualized. Aortic valve regurgitation is not visualized.  5. The inferior vena cava is dilated in size with <50% respiratory variability, suggesting right atrial pressure of 15 mmHg. FINDINGS  Left Ventricle: Left ventricular ejection fraction, by estimation, is 70 to 75%. The left ventricle has hyperdynamic function. The left ventricle has no regional wall motion abnormalities. Definity contrast agent was given IV to delineate the left ventricular endocardial borders. The average left ventricular global longitudinal strain is -17.6 %. The global longitudinal strain is normal. The left ventricular internal cavity size was normal in size. There is mild left ventricular hypertrophy. Left ventricular diastolic parameters are consistent with Grade II diastolic dysfunction (pseudonormalization). Right Ventricle: The right ventricular size is normal. No increase in right ventricular wall thickness. Right ventricular systolic function is normal. There is severely elevated pulmonary artery systolic pressure. The tricuspid regurgitant velocity is 3.41 m/s, and with an assumed right atrial pressure of 15 mmHg, the estimated right ventricular systolic pressure is 82.5 mmHg. Left Atrium: Left atrial size was normal in size. Right Atrium: Right atrial size was normal in size. Pericardium: There is no evidence of pericardial effusion. Mitral Valve: The mitral valve is degenerative in appearance. Mild mitral valve regurgitation. MV peak gradient, 5.7 mmHg. The  mean mitral valve gradient is 3.0 mmHg. Tricuspid Valve: The tricuspid valve is grossly normal. Tricuspid valve regurgitation is trivial. Aortic Valve: The aortic valve was not well visualized. Aortic valve regurgitation is not visualized. Aortic valve mean gradient measures 13.0 mmHg. Aortic valve peak gradient measures 25.4 mmHg. Aortic valve area, by VTI measures 3.24 cm. Pulmonic Valve: The pulmonic valve was normal in structure. Pulmonic valve regurgitation is mild. Aorta: The aortic root is normal in size and structure. Venous: The inferior vena cava is dilated in size with less than 50% respiratory variability, suggesting right atrial pressure of 15 mmHg. IAS/Shunts: No atrial level shunt detected by color flow Doppler.  LEFT VENTRICLE PLAX 2D LVIDd:         4.80 cm  Diastology LVIDs:         2.80 cm  LV e' medial:    9.68 cm/s LV PW:         1.20 cm  LV E/e' medial:  13.5 LV IVS:        1.00 cm  LV e' lateral:   6.42 cm/s LVOT diam:     2.00 cm  LV E/e' lateral: 20.4 LV SV:         130 LV SV Index:   64       2D Longitudinal Strain LVOT Area:     3.14 cm 2D Strain GLS Avg:     -17.6 %  RIGHT VENTRICLE RV Basal diam:  3.40 cm LEFT ATRIUM             Index       RIGHT ATRIUM           Index LA diam:        3.50 cm 1.73 cm/m  RA Area:     13.50 cm LA Vol (A2C):   83.1 ml 40.99 ml/m RA Volume:   29.10 ml  14.35 ml/m LA Vol (A4C):   61.8 ml 30.48 ml/m LA Biplane Vol: 74.0 ml 36.50 ml/m  AORTIC VALVE                    PULMONIC VALVE AV Area (Vmax):    2.51 cm     PV Vmax:       1.79 m/s AV Area (Vmean):   2.68 cm     PV Vmean:      114.000 cm/s AV Area (VTI):     3.24 cm     PV VTI:        0.373 m AV Vmax:           252.00 cm/s  PV Peak grad:  12.8 mmHg AV Vmean:          165.000 cm/s PV Mean grad:  6.0 mmHg AV VTI:            0.401 m AV Peak Grad:      25.4 mmHg AV Mean Grad:      13.0 mmHg LVOT Vmax:         201.00 cm/s LVOT Vmean:        141.000 cm/s LVOT VTI:          0.414 m LVOT/AV VTI ratio:  1.03  AORTA Ao Root diam: 3.00 cm MITRAL VALVE                TRICUSPID VALVE MV Area (PHT): 2.90 cm     TR Peak grad:   46.5 mmHg MV Area VTI:   3.07 cm     TR Vmax:        341.00 cm/s MV Peak grad:  5.7 mmHg MV Mean grad:  3.0 mmHg     SHUNTS MV Vmax:       1.19 m/s     Systemic VTI:  0.41 m MV Vmean:      83.4 cm/s    Systemic Diam: 2.00 cm MV Decel Time: 262 msec MV E velocity: 131.00 cm/s MV A velocity: 120.00 cm/s MV E/A ratio:  1.09 Kate Sable MD Electronically signed by Kate Sable MD Signature Date/Time: 09/20/2020/3:26:25 PM    Final      Labs:   Basic Metabolic Panel: Recent Labs  Lab 09/20/20 6578 09/21/20 0547 09/22/20 0933 09/23/20 0615 09/24/20 0713  NA 139 138 134* 135 135  K 5.4* 4.7 4.3 4.6 3.9  CL 104 101 100 101 100  CO2 25 28 27 28 27   GLUCOSE 115* 78 97 104* 88  BUN 49* 35* 37* 37* 25*  CREATININE 5.88* 4.55* 4.55* 4.61* 3.48*  CALCIUM 7.6* 7.5*  7.7* 7.7* 7.8*   GFR Estimated Creatinine Clearance: 17 mL/min (A) (by C-G formula based on SCr of 3.48 mg/dL (H)). Liver Function Tests: No results for input(s): AST, ALT, ALKPHOS, BILITOT, PROT, ALBUMIN in the last 168 hours. No results for input(s): LIPASE, AMYLASE in the last 168 hours. No results for input(s): AMMONIA in the last 168 hours. Coagulation profile No results for input(s): INR, PROTIME in the last 168 hours.  CBC: Recent Labs  Lab 09/18/20 0746 09/22/20 0933 09/23/20 0615 09/24/20 0713  WBC 6.8 6.5 7.0 7.0  NEUTROABS  --  4.8 5.3 5.4  HGB 10.0* 8.9* 8.3* 9.4*  HCT 32.9* 29.4* 27.7* 31.2*  MCV 96.2 97.4 96.5 97.8  PLT 181 141* 131* 126*   Cardiac Enzymes: No results for input(s): CKTOTAL, CKMB, CKMBINDEX, TROPONINI in the last 168 hours. BNP: Invalid input(s): POCBNP CBG: Recent Labs  Lab 09/20/20 1438 09/20/20 2109  GLUCAP 123* 178*   D-Dimer No results for input(s): DDIMER in the last 72 hours. Hgb A1c No results for input(s): HGBA1C in the last 72 hours. Lipid  Profile No results for input(s): CHOL, HDL, LDLCALC, TRIG, CHOLHDL, LDLDIRECT in the last 72 hours. Thyroid function studies No results for input(s): TSH, T4TOTAL, T3FREE, THYROIDAB in the last 72 hours.  Invalid input(s): FREET3 Anemia work up No results for input(s): VITAMINB12, FOLATE, FERRITIN, TIBC, IRON, RETICCTPCT in the last 72 hours. Microbiology Recent Results (from the past 240 hour(s))  Resp Panel by RT-PCR (Flu A&B, Covid) Nasopharyngeal Swab     Status: None   Collection Time: 09/18/20 12:19 PM   Specimen: Nasopharyngeal Swab; Nasopharyngeal(NP) swabs in vial transport medium  Result Value Ref Range Status   SARS Coronavirus 2 by RT PCR NEGATIVE NEGATIVE Final    Comment: (NOTE) SARS-CoV-2 target nucleic acids are NOT DETECTED.  The SARS-CoV-2 RNA is generally detectable in upper respiratory specimens during the acute phase of infection. The lowest concentration of SARS-CoV-2 viral copies this assay can detect is 138 copies/mL. A negative result does not preclude SARS-Cov-2 infection and should not be used as the sole basis for treatment or other patient management decisions. A negative result may occur with  improper specimen collection/handling, submission of specimen other than nasopharyngeal swab, presence of viral mutation(s) within the areas targeted by this assay, and inadequate number of viral copies(<138 copies/mL). A negative result must be combined with clinical observations, patient history, and epidemiological information. The expected result is Negative.  Fact Sheet for Patients:  EntrepreneurPulse.com.au  Fact Sheet for Healthcare Providers:  IncredibleEmployment.be  This test is no t yet approved or cleared by the [place] FDA and  has been authorized for detection and/or diagnosis of SARS-CoV-2 by FDA under an Emergency Use Authorization (EUA). This EUA will remain  in effect (meaning this test can be used)  for the duration of the COVID-19 declaration under Section 564(b)(1) of the Act, 21 U.S.C.section 360bbb-3(b)(1), unless the authorization is terminated  or revoked sooner.       Influenza A by PCR NEGATIVE NEGATIVE Final   Influenza B by PCR NEGATIVE NEGATIVE Final    Comment: (NOTE) The Xpert Xpress SARS-CoV-2/FLU/RSV plus assay is intended as an aid in the diagnosis of influenza from Nasopharyngeal swab specimens and should not be used as a sole basis for treatment. Nasal washings and aspirates are unacceptable for Xpert Xpress SARS-CoV-2/FLU/RSV testing.  Fact Sheet for Patients: EntrepreneurPulse.com.au  Fact Sheet for Healthcare Providers: IncredibleEmployment.be  This test is not yet approved or cleared  by the [place] and has been authorized for detection and/or diagnosis of SARS-CoV-2 by FDA under an Emergency Use Authorization (EUA). This EUA will remain in effect (meaning this test can be used) for the duration of the COVID-19 declaration under Section 564(b)(1) of the Act, 21 U.S.C. section 360bbb-3(b)(1), unless the authorization is terminated or revoked.  Performed at [facility], [address] [place], [place] [address]   MRSA Next Gen by PCR, Nasal     Status: None   Collection Time: 09/20/20  9:32 PM   Specimen: Nasal Mucosa; Nasal Swab  Result Value Ref Range Status   MRSA by PCR Next Gen NOT DETECTED NOT DETECTED Final    Comment: (NOTE) The GeneXpert MRSA Assay (FDA approved for NASAL specimens only), is one component of a comprehensive MRSA colonization surveillance program. It is not intended to diagnose MRSA infection nor to guide or monitor treatment for MRSA infections. Test performance is not FDA approved in patients less than 14 years old. Performed at [facility], [address] [place], [place] [address]      Signed: Terrilee Croak  [facility] 09/24/2020, 1:03  PM

## 2020-09-24 NOTE — TOC Transition Note (Signed)
Transition of Care [facility] - CM/SW Discharge Note   Patient Details  Name: Anna Key MRN: [id number] Date of Birth: 1950-10-10  Transition of Care [facility]) CM/SW Contact:  Alberteen Sam, LCSW Phone Number: 09/24/2020, 1:58 PM   Clinical Narrative:     Patient to discharge home with home health today, is set up with [facility], Tommi Rumps with [place] informed of discharge today. Patient will be getting oxygen and walker delivered to hospital room by [employer] prior to leaving today, patient informed. Per [place] with [employer] expected delivery time of all equipment is between 2-4pm. Patient reports she will have someone pick her up today.   No other discharge needs identified at this time.    Final next level of care: [place] Barriers to Discharge: No Barriers Identified   Patient Goals and CMS Choice Patient states their goals for this hospitalization and ongoing recovery are:: to go home CMS Medicare.gov Compare Post Acute Care list provided to:: Patient Choice offered to / list presented to : Patient  Discharge Placement                    Patient and family notified of of transfer: 09/24/20  Discharge Plan and Services     Post Acute Care Choice: Home Health          DME Arranged: Oxygen, Walker DME Agency: [employer] Date DME Agency Contacted: 09/24/20 Time DME Agency Contacted: [phone] Representative spoke with at DME Agency: [place]: PT, OT [place] Agency: [place] Date [place]: 09/24/20 Time [place]: [id number] Representative spoke with at [place]: [place]) Interventions     Readmission Risk Interventions No flowsheet data found.

## 2020-09-24 NOTE — Progress Notes (Signed)
[place], [place] 09/24/20  Subjective:   Hospital day # 6  Patient known to our practice from outpatient follow-up.  She has history of advanced CKD, diastolic CHF, hypertension and dyslipidemia.  She presents to the emergency room for worsening shortness of breath and lower extremity edema for the past 2 weeks or so.    Patient was seen today on second floor patient main concern in today visit was that she wishes to go home Patient offers no new specific physical complaint. Patient informed me that she was feeling better than before      Objective:  Vital signs in last 24 hours:  Temp:  [98.2 F (36.8 C)-98.8 F (37.1 C)] 98.2 F (36.8 C) (08/13 0801) Pulse Rate:  [70-86] 84 (08/13 0801) Resp:  [17-23] 17 (08/13 0801) BP: (129-180)/(72-132) 141/80 (08/13 0801) SpO2:  [90 %-100 %] 96 % (08/13 0801) Weight:  [94 kg] 94 kg (08/13 0648)  Weight change: -3.8 kg Filed Weights   09/21/20 0623 09/23/20 0421 09/24/20 0648  Weight: 97.2 kg 97.8 kg 94 kg    Intake/Output:    Intake/Output Summary (Last 24 hours) at 09/24/2020 0943 Last data filed at 09/24/2020 0815 Gross per 24 hour  Intake 240 ml  Output 2250 ml  Net -2010 ml    Physical Exam: General:  No acute distress, laying in bed  HEENT  anicteric, moist oral mucous membrane  Pulm/lungs  normal breathing effort, b/l crackles (improving)  CVS/Heart  regular rhythm, no rub or gallop  Abdomen:   Soft, nontender  Extremities:  ++ peripheral edema  Neurologic:  Alert, able to follow commands  Skin:  No acute rashes    Basic Metabolic Panel:  Recent Labs  Lab 09/20/20 0624 09/21/20 0547 09/22/20 0933 09/23/20 0615 09/24/20 0713  NA 139 138 134* 135 135  K 5.4* 4.7 4.3 4.6 3.9  CL 104 101 100 101 100  CO2 25 28 27 28 27   GLUCOSE 115* 78 97 104* 88  BUN 49* 35* 37* 37* 25*  CREATININE 5.88* 4.55* 4.55* 4.61* 3.48*  CALCIUM 7.6* 7.5* 7.7* 7.7* 7.8*     CBC: Recent Labs   Lab 09/18/20 0746 09/22/20 0933 09/23/20 0615 09/24/20 0713  WBC 6.8 6.5 7.0 7.0  NEUTROABS  --  4.8 5.3 5.4  HGB 10.0* 8.9* 8.3* 9.4*  HCT 32.9* 29.4* 27.7* 31.2*  MCV 96.2 97.4 96.5 97.8  PLT 181 141* 131* 126*      Lab Results  Component Value Date   HEPBSAG NON REACTIVE 09/18/2020   HEPBSAG NON REACTIVE 09/18/2020   HEPBSAB NON REACTIVE 09/18/2020      Microbiology:  Recent Results (from the past 240 hour(s))  Resp Panel by RT-PCR (Flu A&B, Covid) Nasopharyngeal Swab     Status: None   Collection Time: 09/18/20 12:19 PM   Specimen: Nasopharyngeal Swab; Nasopharyngeal(NP) swabs in vial transport medium  Result Value Ref Range Status   SARS Coronavirus 2 by RT PCR NEGATIVE NEGATIVE Final    Comment: (NOTE) SARS-CoV-2 target nucleic acids are NOT DETECTED.  The SARS-CoV-2 RNA is generally detectable in upper respiratory specimens during the acute phase of infection. The lowest concentration of SARS-CoV-2 viral copies this assay can detect is 138 copies/mL. A negative result does not preclude SARS-Cov-2 infection and should not be used as the sole basis for treatment or other patient management decisions. A negative result may occur with  improper specimen collection/handling, submission of specimen other than nasopharyngeal swab, presence  of viral mutation(s) within the areas targeted by this assay, and inadequate number of viral copies(<138 copies/mL). A negative result must be combined with clinical observations, patient history, and epidemiological information. The expected result is Negative.  Fact Sheet for Patients:  EntrepreneurPulse.com.au  Fact Sheet for Healthcare Providers:  IncredibleEmployment.be  This test is no t yet approved or cleared by the [place] FDA and  has been authorized for detection and/or diagnosis of SARS-CoV-2 by FDA under an Emergency Use Authorization (EUA). This EUA will remain  in  effect (meaning this test can be used) for the duration of the COVID-19 declaration under Section 564(b)(1) of the Act, 21 U.S.C.section 360bbb-3(b)(1), unless the authorization is terminated  or revoked sooner.       Influenza A by PCR NEGATIVE NEGATIVE Final   Influenza B by PCR NEGATIVE NEGATIVE Final    Comment: (NOTE) The Xpert Xpress SARS-CoV-2/FLU/RSV plus assay is intended as an aid in the diagnosis of influenza from Nasopharyngeal swab specimens and should not be used as a sole basis for treatment. Nasal washings and aspirates are unacceptable for Xpert Xpress SARS-CoV-2/FLU/RSV testing.  Fact Sheet for Patients: EntrepreneurPulse.com.au  Fact Sheet for Healthcare Providers: IncredibleEmployment.be  This test is not yet approved or cleared by the [place] FDA and has been authorized for detection and/or diagnosis of SARS-CoV-2 by FDA under an Emergency Use Authorization (EUA). This EUA will remain in effect (meaning this test can be used) for the duration of the COVID-19 declaration under Section 564(b)(1) of the Act, 21 U.S.C. section 360bbb-3(b)(1), unless the authorization is terminated or revoked.  Performed at [facility], [address] [place], [place] [address]   MRSA Next Gen by PCR, Nasal     Status: None   Collection Time: 09/20/20  9:32 PM   Specimen: Nasal Mucosa; Nasal Swab  Result Value Ref Range Status   MRSA by PCR Next Gen NOT DETECTED NOT DETECTED Final    Comment: (NOTE) The GeneXpert MRSA Assay (FDA approved for NASAL specimens only), is one component of a comprehensive MRSA colonization surveillance program. It is not intended to diagnose MRSA infection nor to guide or monitor treatment for MRSA infections. Test performance is not FDA approved in patients less than 53 years old. Performed at [facility], [address] [place], [place] [address]     Coagulation Studies: No  results for input(s): LABPROT, INR in the last 72 hours.  Urinalysis: No results for input(s): COLORURINE, LABSPEC, PHURINE, GLUCOSEU, HGBUR, BILIRUBINUR, KETONESUR, PROTEINUR, UROBILINOGEN, NITRITE, LEUKOCYTESUR in the last 72 hours.  Invalid input(s): APPERANCEUR    Imaging: PERIPHERAL VASCULAR CATHETERIZATION  Result Date: 09/22/2020 See surgical note for result.    Medications:    sodium chloride      allopurinol  100 mg Oral q morning   amLODipine  10 mg Oral q morning   aspirin EC  325 mg Oral Daily   atorvastatin  40 mg Oral q morning   calcitRIOL  0.25 mcg Oral Daily   calcium carbonate  400 mg of elemental calcium Oral Q breakfast   Chlorhexidine Gluconate Cloth  6 each Topical Q0600   epoetin (EPOGEN/PROCRIT) injection  4,000 Units Intravenous Q M,W,F-HD   heparin  5,000 Units Subcutaneous Q8H   isosorbide mononitrate  60 mg Oral q morning   losartan  25 mg Oral QHS   metoprolol  200 mg Oral q morning   sodium chloride flush  3 mL Intravenous Q12H   torsemide  40 mg Oral Daily   sodium chloride, acetaminophen, haloperidol lactate, lidocaine-prilocaine, ondansetron (ZOFRAN) IV, sodium chloride flush  Assessment/ Plan:  70 y.o. female with advanced chronic kidney disease, hypertension, osteoarthritis with history of left knee replacement 2021, gout, chronic hep C treated in 2020, cirrhosis, past history of alcohol use, LVH, diastolic dysfunction, current smoker, coronary disease with history of angioplasty admitted on 09/18/2020 for Acute pulmonary edema (HCC) [J81.0] DVT (deep venous thrombosis) (HCC) [I82.409] Acute respiratory failure with hypoxia (HCC) [P10.25] Acute diastolic CHF (congestive heart failure) (HCC) [I50.31] Stage 5 chronic kidney disease not on chronic dialysis ([place]) [N18.5]      1)Renal    Patient now has end-stage renal disease. Patient admitted with volume overload Patient had dialysis initiated on 09/19/20. Vascular performed angiogram  on 09/22/20, negative for stenosis Patient did receive dialysis in the chair. Dialysis coordinator has confirmed outpatient clinic at [facility] with a start date next Tuesday. Torsemide 40mg  daily Low dose losartan 25mg  daily for CV risk reduction   2)HTN    Blood pressure is stable    3)Anemia of chronic disease  CBC Latest Ref Rng & Units 09/24/2020 09/23/2020 09/22/2020  WBC 4.0 - 10.5 K/uL 7.0 7.0 6.5  Hemoglobin 12.0 - 15.0 g/dL 9.4(L) 8.3(L) 8.9(L)  Hematocrit 36.0 - 46.0 % 31.2(L) 27.7(L) 29.4(L)  Platelets 150 - 400 K/uL 126(L) 131(L) 141(L)       HGb at goal (9--11)   4) Secondary hyperparathyroidism -CKD Mineral-Bone Disorder    Lab Results  Component Value Date   CALCIUM 7.8 (L) 09/24/2020    Secondary Hyperparathyroidism present Intact PTH at 130  Phosphorus at goal.   5)) Electrolytes   BMP Latest Ref Rng & Units 09/24/2020 09/23/2020 09/22/2020  Glucose 70 - 99 mg/dL 88 104(H) 97  BUN 8 - 23 mg/dL 25(H) 37(H) 37(H)  Creatinine 0.44 - 1.00 mg/dL 3.48(H) 4.61(H) 4.55(H)  Sodium 135 - 145 mmol/L 135 135 134(L)  Potassium 3.5 - 5.1 mmol/L 3.9 4.6 4.3  Chloride 98 - 111 mmol/L 100 101 100  CO2 22 - 32 mmol/L 27 28 27   Calcium 8.9 - 10.3 mg/dL 7.8(L) 7.7(L) 7.7(L)     Sodium Normonatremic   Potassium Normokalemic    6)Acid base    Co2 at goal    Plan   No need for renal replacement therapy today Patient does have an outpatient chair. We will continue to follow If patient is discharged we will see patient as an outpatient   LOS: 6 Elfreida Heggs s Ziaire Bieser 8/13/20229:43 AM  [facility] [place], [place]

## 2020-09-24 NOTE — Progress Notes (Signed)
Patient 02 saturation on room air at rest= 80%  Patient 02 saturation on 4L 02 at rest= 95%  Patient 02 saturation on 4L while ambulating=95%

## 2020-09-25 LAB — QUANTIFERON-TB GOLD PLUS (RQFGPL)
QuantiFERON Mitogen Value: 7.51 IU/mL
QuantiFERON Nil Value: 0 IU/mL
QuantiFERON TB1 Ag Value: 0 IU/mL
QuantiFERON TB2 Ag Value: 0 IU/mL

## 2020-09-25 LAB — QUANTIFERON-TB GOLD PLUS: QuantiFERON-TB Gold Plus: NEGATIVE

## 2020-09-28 DIAGNOSIS — J9601 Acute respiratory failure with hypoxia: Secondary | ICD-10-CM | POA: Diagnosis not present

## 2020-09-28 DIAGNOSIS — D631 Anemia in chronic kidney disease: Secondary | ICD-10-CM | POA: Diagnosis not present

## 2020-09-28 DIAGNOSIS — E875 Hyperkalemia: Secondary | ICD-10-CM | POA: Diagnosis not present

## 2020-09-28 DIAGNOSIS — I132 Hypertensive heart and chronic kidney disease with heart failure and with stage 5 chronic kidney disease, or end stage renal disease: Secondary | ICD-10-CM | POA: Diagnosis not present

## 2020-09-28 DIAGNOSIS — I5033 Acute on chronic diastolic (congestive) heart failure: Secondary | ICD-10-CM | POA: Diagnosis not present

## 2020-09-28 DIAGNOSIS — E785 Hyperlipidemia, unspecified: Secondary | ICD-10-CM | POA: Diagnosis not present

## 2020-09-28 DIAGNOSIS — N186 End stage renal disease: Secondary | ICD-10-CM | POA: Diagnosis not present

## 2020-09-28 DIAGNOSIS — I088 Other rheumatic multiple valve diseases: Secondary | ICD-10-CM | POA: Diagnosis not present

## 2020-09-28 DIAGNOSIS — I2510 Atherosclerotic heart disease of native coronary artery without angina pectoris: Secondary | ICD-10-CM | POA: Diagnosis not present

## 2020-09-30 DIAGNOSIS — I509 Heart failure, unspecified: Secondary | ICD-10-CM | POA: Diagnosis not present

## 2020-10-01 DIAGNOSIS — N186 End stage renal disease: Secondary | ICD-10-CM | POA: Diagnosis not present

## 2020-10-01 DIAGNOSIS — Z992 Dependence on renal dialysis: Secondary | ICD-10-CM | POA: Diagnosis not present

## 2020-10-04 DIAGNOSIS — I5033 Acute on chronic diastolic (congestive) heart failure: Secondary | ICD-10-CM | POA: Diagnosis not present

## 2020-10-04 DIAGNOSIS — I132 Hypertensive heart and chronic kidney disease with heart failure and with stage 5 chronic kidney disease, or end stage renal disease: Secondary | ICD-10-CM | POA: Diagnosis not present

## 2020-10-04 DIAGNOSIS — D631 Anemia in chronic kidney disease: Secondary | ICD-10-CM | POA: Diagnosis not present

## 2020-10-04 DIAGNOSIS — I088 Other rheumatic multiple valve diseases: Secondary | ICD-10-CM | POA: Diagnosis not present

## 2020-10-04 DIAGNOSIS — N186 End stage renal disease: Secondary | ICD-10-CM | POA: Diagnosis not present

## 2020-10-04 DIAGNOSIS — Z992 Dependence on renal dialysis: Secondary | ICD-10-CM | POA: Diagnosis not present

## 2020-10-04 DIAGNOSIS — J9601 Acute respiratory failure with hypoxia: Secondary | ICD-10-CM | POA: Diagnosis not present

## 2020-10-04 DIAGNOSIS — I2510 Atherosclerotic heart disease of native coronary artery without angina pectoris: Secondary | ICD-10-CM | POA: Diagnosis not present

## 2020-10-05 DIAGNOSIS — D631 Anemia in chronic kidney disease: Secondary | ICD-10-CM | POA: Diagnosis not present

## 2020-10-05 DIAGNOSIS — E875 Hyperkalemia: Secondary | ICD-10-CM | POA: Diagnosis not present

## 2020-10-05 DIAGNOSIS — I2510 Atherosclerotic heart disease of native coronary artery without angina pectoris: Secondary | ICD-10-CM | POA: Diagnosis not present

## 2020-10-05 DIAGNOSIS — I132 Hypertensive heart and chronic kidney disease with heart failure and with stage 5 chronic kidney disease, or end stage renal disease: Secondary | ICD-10-CM | POA: Diagnosis not present

## 2020-10-05 DIAGNOSIS — J9601 Acute respiratory failure with hypoxia: Secondary | ICD-10-CM | POA: Diagnosis not present

## 2020-10-05 DIAGNOSIS — N186 End stage renal disease: Secondary | ICD-10-CM | POA: Diagnosis not present

## 2020-10-05 DIAGNOSIS — I5033 Acute on chronic diastolic (congestive) heart failure: Secondary | ICD-10-CM | POA: Diagnosis not present

## 2020-10-05 DIAGNOSIS — I088 Other rheumatic multiple valve diseases: Secondary | ICD-10-CM | POA: Diagnosis not present

## 2020-10-05 DIAGNOSIS — E785 Hyperlipidemia, unspecified: Secondary | ICD-10-CM | POA: Diagnosis not present

## 2020-10-06 DIAGNOSIS — Z1159 Encounter for screening for other viral diseases: Secondary | ICD-10-CM | POA: Diagnosis not present

## 2020-10-06 DIAGNOSIS — N186 End stage renal disease: Secondary | ICD-10-CM | POA: Diagnosis not present

## 2020-10-06 DIAGNOSIS — Z992 Dependence on renal dialysis: Secondary | ICD-10-CM | POA: Diagnosis not present

## 2020-10-07 DIAGNOSIS — E875 Hyperkalemia: Secondary | ICD-10-CM | POA: Diagnosis not present

## 2020-10-07 DIAGNOSIS — E785 Hyperlipidemia, unspecified: Secondary | ICD-10-CM | POA: Diagnosis not present

## 2020-10-07 DIAGNOSIS — I088 Other rheumatic multiple valve diseases: Secondary | ICD-10-CM | POA: Diagnosis not present

## 2020-10-07 DIAGNOSIS — I5033 Acute on chronic diastolic (congestive) heart failure: Secondary | ICD-10-CM | POA: Diagnosis not present

## 2020-10-07 DIAGNOSIS — D631 Anemia in chronic kidney disease: Secondary | ICD-10-CM | POA: Diagnosis not present

## 2020-10-07 DIAGNOSIS — I2510 Atherosclerotic heart disease of native coronary artery without angina pectoris: Secondary | ICD-10-CM | POA: Diagnosis not present

## 2020-10-07 DIAGNOSIS — J9601 Acute respiratory failure with hypoxia: Secondary | ICD-10-CM | POA: Diagnosis not present

## 2020-10-07 DIAGNOSIS — I132 Hypertensive heart and chronic kidney disease with heart failure and with stage 5 chronic kidney disease, or end stage renal disease: Secondary | ICD-10-CM | POA: Diagnosis not present

## 2020-10-07 DIAGNOSIS — N186 End stage renal disease: Secondary | ICD-10-CM | POA: Diagnosis not present

## 2020-10-08 DIAGNOSIS — N186 End stage renal disease: Secondary | ICD-10-CM | POA: Diagnosis not present

## 2020-10-08 DIAGNOSIS — D631 Anemia in chronic kidney disease: Secondary | ICD-10-CM | POA: Diagnosis not present

## 2020-10-08 DIAGNOSIS — Z992 Dependence on renal dialysis: Secondary | ICD-10-CM | POA: Diagnosis not present

## 2020-10-12 DIAGNOSIS — I132 Hypertensive heart and chronic kidney disease with heart failure and with stage 5 chronic kidney disease, or end stage renal disease: Secondary | ICD-10-CM | POA: Diagnosis not present

## 2020-10-12 DIAGNOSIS — E875 Hyperkalemia: Secondary | ICD-10-CM | POA: Diagnosis not present

## 2020-10-12 DIAGNOSIS — J9601 Acute respiratory failure with hypoxia: Secondary | ICD-10-CM | POA: Diagnosis not present

## 2020-10-12 DIAGNOSIS — I5033 Acute on chronic diastolic (congestive) heart failure: Secondary | ICD-10-CM | POA: Diagnosis not present

## 2020-10-12 DIAGNOSIS — D631 Anemia in chronic kidney disease: Secondary | ICD-10-CM | POA: Diagnosis not present

## 2020-10-12 DIAGNOSIS — Z992 Dependence on renal dialysis: Secondary | ICD-10-CM | POA: Diagnosis not present

## 2020-10-12 DIAGNOSIS — I088 Other rheumatic multiple valve diseases: Secondary | ICD-10-CM | POA: Diagnosis not present

## 2020-10-12 DIAGNOSIS — N186 End stage renal disease: Secondary | ICD-10-CM | POA: Diagnosis not present

## 2020-10-12 DIAGNOSIS — I2510 Atherosclerotic heart disease of native coronary artery without angina pectoris: Secondary | ICD-10-CM | POA: Diagnosis not present

## 2020-10-12 DIAGNOSIS — E785 Hyperlipidemia, unspecified: Secondary | ICD-10-CM | POA: Diagnosis not present

## 2020-10-15 DIAGNOSIS — Z992 Dependence on renal dialysis: Secondary | ICD-10-CM | POA: Diagnosis not present

## 2020-10-15 DIAGNOSIS — N186 End stage renal disease: Secondary | ICD-10-CM | POA: Diagnosis not present

## 2020-10-18 DIAGNOSIS — N186 End stage renal disease: Secondary | ICD-10-CM | POA: Diagnosis not present

## 2020-10-18 DIAGNOSIS — Z992 Dependence on renal dialysis: Secondary | ICD-10-CM | POA: Diagnosis not present

## 2020-10-19 DIAGNOSIS — I2510 Atherosclerotic heart disease of native coronary artery without angina pectoris: Secondary | ICD-10-CM | POA: Diagnosis not present

## 2020-10-19 DIAGNOSIS — J9601 Acute respiratory failure with hypoxia: Secondary | ICD-10-CM | POA: Diagnosis not present

## 2020-10-19 DIAGNOSIS — I132 Hypertensive heart and chronic kidney disease with heart failure and with stage 5 chronic kidney disease, or end stage renal disease: Secondary | ICD-10-CM | POA: Diagnosis not present

## 2020-10-19 DIAGNOSIS — I088 Other rheumatic multiple valve diseases: Secondary | ICD-10-CM | POA: Diagnosis not present

## 2020-10-19 DIAGNOSIS — E875 Hyperkalemia: Secondary | ICD-10-CM | POA: Diagnosis not present

## 2020-10-19 DIAGNOSIS — D631 Anemia in chronic kidney disease: Secondary | ICD-10-CM | POA: Diagnosis not present

## 2020-10-19 DIAGNOSIS — I5033 Acute on chronic diastolic (congestive) heart failure: Secondary | ICD-10-CM | POA: Diagnosis not present

## 2020-10-19 DIAGNOSIS — E785 Hyperlipidemia, unspecified: Secondary | ICD-10-CM | POA: Diagnosis not present

## 2020-10-19 DIAGNOSIS — N186 End stage renal disease: Secondary | ICD-10-CM | POA: Diagnosis not present

## 2020-10-20 DIAGNOSIS — E875 Hyperkalemia: Secondary | ICD-10-CM | POA: Diagnosis not present

## 2020-10-20 DIAGNOSIS — I132 Hypertensive heart and chronic kidney disease with heart failure and with stage 5 chronic kidney disease, or end stage renal disease: Secondary | ICD-10-CM | POA: Diagnosis not present

## 2020-10-20 DIAGNOSIS — E785 Hyperlipidemia, unspecified: Secondary | ICD-10-CM | POA: Diagnosis not present

## 2020-10-20 DIAGNOSIS — I088 Other rheumatic multiple valve diseases: Secondary | ICD-10-CM | POA: Diagnosis not present

## 2020-10-20 DIAGNOSIS — I5033 Acute on chronic diastolic (congestive) heart failure: Secondary | ICD-10-CM | POA: Diagnosis not present

## 2020-10-20 DIAGNOSIS — J9601 Acute respiratory failure with hypoxia: Secondary | ICD-10-CM | POA: Diagnosis not present

## 2020-10-20 DIAGNOSIS — D631 Anemia in chronic kidney disease: Secondary | ICD-10-CM | POA: Diagnosis not present

## 2020-10-20 DIAGNOSIS — I2510 Atherosclerotic heart disease of native coronary artery without angina pectoris: Secondary | ICD-10-CM | POA: Diagnosis not present

## 2020-10-20 DIAGNOSIS — N186 End stage renal disease: Secondary | ICD-10-CM | POA: Diagnosis not present

## 2020-10-20 DIAGNOSIS — Z992 Dependence on renal dialysis: Secondary | ICD-10-CM | POA: Diagnosis not present

## 2020-10-25 DIAGNOSIS — I5031 Acute diastolic (congestive) heart failure: Secondary | ICD-10-CM | POA: Diagnosis not present

## 2020-10-25 DIAGNOSIS — N186 End stage renal disease: Secondary | ICD-10-CM | POA: Diagnosis not present

## 2020-10-25 DIAGNOSIS — Z992 Dependence on renal dialysis: Secondary | ICD-10-CM | POA: Diagnosis not present

## 2020-10-26 DIAGNOSIS — I2510 Atherosclerotic heart disease of native coronary artery without angina pectoris: Secondary | ICD-10-CM | POA: Diagnosis not present

## 2020-10-26 DIAGNOSIS — I5033 Acute on chronic diastolic (congestive) heart failure: Secondary | ICD-10-CM | POA: Diagnosis not present

## 2020-10-26 DIAGNOSIS — Z992 Dependence on renal dialysis: Secondary | ICD-10-CM | POA: Diagnosis not present

## 2020-10-26 DIAGNOSIS — E785 Hyperlipidemia, unspecified: Secondary | ICD-10-CM | POA: Diagnosis not present

## 2020-10-26 DIAGNOSIS — D631 Anemia in chronic kidney disease: Secondary | ICD-10-CM | POA: Diagnosis not present

## 2020-10-26 DIAGNOSIS — I088 Other rheumatic multiple valve diseases: Secondary | ICD-10-CM | POA: Diagnosis not present

## 2020-10-26 DIAGNOSIS — I132 Hypertensive heart and chronic kidney disease with heart failure and with stage 5 chronic kidney disease, or end stage renal disease: Secondary | ICD-10-CM | POA: Diagnosis not present

## 2020-10-26 DIAGNOSIS — E875 Hyperkalemia: Secondary | ICD-10-CM | POA: Diagnosis not present

## 2020-10-26 DIAGNOSIS — J9601 Acute respiratory failure with hypoxia: Secondary | ICD-10-CM | POA: Diagnosis not present

## 2020-10-26 DIAGNOSIS — N186 End stage renal disease: Secondary | ICD-10-CM | POA: Diagnosis not present

## 2020-10-28 DIAGNOSIS — I132 Hypertensive heart and chronic kidney disease with heart failure and with stage 5 chronic kidney disease, or end stage renal disease: Secondary | ICD-10-CM | POA: Diagnosis not present

## 2020-10-28 DIAGNOSIS — I5033 Acute on chronic diastolic (congestive) heart failure: Secondary | ICD-10-CM | POA: Diagnosis not present

## 2020-10-28 DIAGNOSIS — J9601 Acute respiratory failure with hypoxia: Secondary | ICD-10-CM | POA: Diagnosis not present

## 2020-10-28 DIAGNOSIS — E785 Hyperlipidemia, unspecified: Secondary | ICD-10-CM | POA: Diagnosis not present

## 2020-10-28 DIAGNOSIS — I088 Other rheumatic multiple valve diseases: Secondary | ICD-10-CM | POA: Diagnosis not present

## 2020-10-28 DIAGNOSIS — I2510 Atherosclerotic heart disease of native coronary artery without angina pectoris: Secondary | ICD-10-CM | POA: Diagnosis not present

## 2020-10-28 DIAGNOSIS — N186 End stage renal disease: Secondary | ICD-10-CM | POA: Diagnosis not present

## 2020-10-28 DIAGNOSIS — E875 Hyperkalemia: Secondary | ICD-10-CM | POA: Diagnosis not present

## 2020-10-28 DIAGNOSIS — D631 Anemia in chronic kidney disease: Secondary | ICD-10-CM | POA: Diagnosis not present

## 2020-10-29 DIAGNOSIS — Z992 Dependence on renal dialysis: Secondary | ICD-10-CM | POA: Diagnosis not present

## 2020-10-29 DIAGNOSIS — N186 End stage renal disease: Secondary | ICD-10-CM | POA: Diagnosis not present

## 2020-11-01 DIAGNOSIS — Z992 Dependence on renal dialysis: Secondary | ICD-10-CM | POA: Diagnosis not present

## 2020-11-01 DIAGNOSIS — N186 End stage renal disease: Secondary | ICD-10-CM | POA: Diagnosis not present

## 2020-11-02 DIAGNOSIS — E875 Hyperkalemia: Secondary | ICD-10-CM | POA: Diagnosis not present

## 2020-11-02 DIAGNOSIS — N186 End stage renal disease: Secondary | ICD-10-CM | POA: Diagnosis not present

## 2020-11-02 DIAGNOSIS — D631 Anemia in chronic kidney disease: Secondary | ICD-10-CM | POA: Diagnosis not present

## 2020-11-02 DIAGNOSIS — I2510 Atherosclerotic heart disease of native coronary artery without angina pectoris: Secondary | ICD-10-CM | POA: Diagnosis not present

## 2020-11-02 DIAGNOSIS — I088 Other rheumatic multiple valve diseases: Secondary | ICD-10-CM | POA: Diagnosis not present

## 2020-11-02 DIAGNOSIS — I5033 Acute on chronic diastolic (congestive) heart failure: Secondary | ICD-10-CM | POA: Diagnosis not present

## 2020-11-02 DIAGNOSIS — E785 Hyperlipidemia, unspecified: Secondary | ICD-10-CM | POA: Diagnosis not present

## 2020-11-02 DIAGNOSIS — I132 Hypertensive heart and chronic kidney disease with heart failure and with stage 5 chronic kidney disease, or end stage renal disease: Secondary | ICD-10-CM | POA: Diagnosis not present

## 2020-11-02 DIAGNOSIS — J9601 Acute respiratory failure with hypoxia: Secondary | ICD-10-CM | POA: Diagnosis not present

## 2020-11-04 DIAGNOSIS — N186 End stage renal disease: Secondary | ICD-10-CM | POA: Diagnosis not present

## 2020-11-04 DIAGNOSIS — I088 Other rheumatic multiple valve diseases: Secondary | ICD-10-CM | POA: Diagnosis not present

## 2020-11-04 DIAGNOSIS — E875 Hyperkalemia: Secondary | ICD-10-CM | POA: Diagnosis not present

## 2020-11-04 DIAGNOSIS — I2510 Atherosclerotic heart disease of native coronary artery without angina pectoris: Secondary | ICD-10-CM | POA: Diagnosis not present

## 2020-11-04 DIAGNOSIS — J9601 Acute respiratory failure with hypoxia: Secondary | ICD-10-CM | POA: Diagnosis not present

## 2020-11-04 DIAGNOSIS — I132 Hypertensive heart and chronic kidney disease with heart failure and with stage 5 chronic kidney disease, or end stage renal disease: Secondary | ICD-10-CM | POA: Diagnosis not present

## 2020-11-04 DIAGNOSIS — I5033 Acute on chronic diastolic (congestive) heart failure: Secondary | ICD-10-CM | POA: Diagnosis not present

## 2020-11-04 DIAGNOSIS — E785 Hyperlipidemia, unspecified: Secondary | ICD-10-CM | POA: Diagnosis not present

## 2020-11-04 DIAGNOSIS — D631 Anemia in chronic kidney disease: Secondary | ICD-10-CM | POA: Diagnosis not present

## 2020-11-05 DIAGNOSIS — N186 End stage renal disease: Secondary | ICD-10-CM | POA: Diagnosis not present

## 2020-11-05 DIAGNOSIS — Z992 Dependence on renal dialysis: Secondary | ICD-10-CM | POA: Diagnosis not present

## 2020-11-07 DIAGNOSIS — E785 Hyperlipidemia, unspecified: Secondary | ICD-10-CM | POA: Diagnosis not present

## 2020-11-07 DIAGNOSIS — I5033 Acute on chronic diastolic (congestive) heart failure: Secondary | ICD-10-CM | POA: Diagnosis not present

## 2020-11-07 DIAGNOSIS — I132 Hypertensive heart and chronic kidney disease with heart failure and with stage 5 chronic kidney disease, or end stage renal disease: Secondary | ICD-10-CM | POA: Diagnosis not present

## 2020-11-07 DIAGNOSIS — N186 End stage renal disease: Secondary | ICD-10-CM | POA: Diagnosis not present

## 2020-11-07 DIAGNOSIS — E875 Hyperkalemia: Secondary | ICD-10-CM | POA: Diagnosis not present

## 2020-11-07 DIAGNOSIS — I088 Other rheumatic multiple valve diseases: Secondary | ICD-10-CM | POA: Diagnosis not present

## 2020-11-07 DIAGNOSIS — J9601 Acute respiratory failure with hypoxia: Secondary | ICD-10-CM | POA: Diagnosis not present

## 2020-11-07 DIAGNOSIS — D631 Anemia in chronic kidney disease: Secondary | ICD-10-CM | POA: Diagnosis not present

## 2020-11-07 DIAGNOSIS — I2510 Atherosclerotic heart disease of native coronary artery without angina pectoris: Secondary | ICD-10-CM | POA: Diagnosis not present

## 2020-11-08 DIAGNOSIS — Z992 Dependence on renal dialysis: Secondary | ICD-10-CM | POA: Diagnosis not present

## 2020-11-08 DIAGNOSIS — N186 End stage renal disease: Secondary | ICD-10-CM | POA: Diagnosis not present

## 2020-11-10 DIAGNOSIS — N186 End stage renal disease: Secondary | ICD-10-CM | POA: Diagnosis not present

## 2020-11-10 DIAGNOSIS — Z992 Dependence on renal dialysis: Secondary | ICD-10-CM | POA: Diagnosis not present

## 2020-11-11 DIAGNOSIS — D631 Anemia in chronic kidney disease: Secondary | ICD-10-CM | POA: Diagnosis not present

## 2020-11-11 DIAGNOSIS — I132 Hypertensive heart and chronic kidney disease with heart failure and with stage 5 chronic kidney disease, or end stage renal disease: Secondary | ICD-10-CM | POA: Diagnosis not present

## 2020-11-11 DIAGNOSIS — I2510 Atherosclerotic heart disease of native coronary artery without angina pectoris: Secondary | ICD-10-CM | POA: Diagnosis not present

## 2020-11-11 DIAGNOSIS — I088 Other rheumatic multiple valve diseases: Secondary | ICD-10-CM | POA: Diagnosis not present

## 2020-11-11 DIAGNOSIS — J9601 Acute respiratory failure with hypoxia: Secondary | ICD-10-CM | POA: Diagnosis not present

## 2020-11-11 DIAGNOSIS — I5033 Acute on chronic diastolic (congestive) heart failure: Secondary | ICD-10-CM | POA: Diagnosis not present

## 2020-11-11 DIAGNOSIS — E875 Hyperkalemia: Secondary | ICD-10-CM | POA: Diagnosis not present

## 2020-11-11 DIAGNOSIS — E785 Hyperlipidemia, unspecified: Secondary | ICD-10-CM | POA: Diagnosis not present

## 2020-11-11 DIAGNOSIS — N186 End stage renal disease: Secondary | ICD-10-CM | POA: Diagnosis not present

## 2020-11-11 DIAGNOSIS — Z992 Dependence on renal dialysis: Secondary | ICD-10-CM | POA: Diagnosis not present

## 2020-11-12 DIAGNOSIS — N186 End stage renal disease: Secondary | ICD-10-CM | POA: Diagnosis not present

## 2020-11-12 DIAGNOSIS — Z992 Dependence on renal dialysis: Secondary | ICD-10-CM | POA: Diagnosis not present

## 2020-11-15 DIAGNOSIS — N186 End stage renal disease: Secondary | ICD-10-CM | POA: Diagnosis not present

## 2020-11-15 DIAGNOSIS — Z992 Dependence on renal dialysis: Secondary | ICD-10-CM | POA: Diagnosis not present

## 2020-11-17 DIAGNOSIS — Z992 Dependence on renal dialysis: Secondary | ICD-10-CM | POA: Diagnosis not present

## 2020-11-17 DIAGNOSIS — N186 End stage renal disease: Secondary | ICD-10-CM | POA: Diagnosis not present

## 2020-11-19 DIAGNOSIS — Z992 Dependence on renal dialysis: Secondary | ICD-10-CM | POA: Diagnosis not present

## 2020-11-19 DIAGNOSIS — N186 End stage renal disease: Secondary | ICD-10-CM | POA: Diagnosis not present

## 2020-11-21 ENCOUNTER — Other Ambulatory Visit: Payer: Self-pay | Admitting: Oncology

## 2020-11-22 DIAGNOSIS — Z992 Dependence on renal dialysis: Secondary | ICD-10-CM | POA: Diagnosis not present

## 2020-11-22 DIAGNOSIS — N186 End stage renal disease: Secondary | ICD-10-CM | POA: Diagnosis not present

## 2020-11-24 ENCOUNTER — Other Ambulatory Visit: Payer: Self-pay

## 2020-11-24 DIAGNOSIS — I5031 Acute diastolic (congestive) heart failure: Secondary | ICD-10-CM | POA: Diagnosis not present

## 2020-11-24 DIAGNOSIS — D509 Iron deficiency anemia, unspecified: Secondary | ICD-10-CM

## 2020-11-26 DIAGNOSIS — N186 End stage renal disease: Secondary | ICD-10-CM | POA: Diagnosis not present

## 2020-11-26 DIAGNOSIS — Z992 Dependence on renal dialysis: Secondary | ICD-10-CM | POA: Diagnosis not present

## 2020-11-28 ENCOUNTER — Inpatient Hospital Stay: Payer: Medicare HMO | Admitting: Oncology

## 2020-11-28 ENCOUNTER — Inpatient Hospital Stay: Payer: Medicare HMO

## 2020-11-29 DIAGNOSIS — N186 End stage renal disease: Secondary | ICD-10-CM | POA: Diagnosis not present

## 2020-11-29 DIAGNOSIS — Z992 Dependence on renal dialysis: Secondary | ICD-10-CM | POA: Diagnosis not present

## 2020-12-03 DIAGNOSIS — N186 End stage renal disease: Secondary | ICD-10-CM | POA: Diagnosis not present

## 2020-12-03 DIAGNOSIS — Z992 Dependence on renal dialysis: Secondary | ICD-10-CM | POA: Diagnosis not present

## 2020-12-06 DIAGNOSIS — N186 End stage renal disease: Secondary | ICD-10-CM | POA: Diagnosis not present

## 2020-12-06 DIAGNOSIS — Z992 Dependence on renal dialysis: Secondary | ICD-10-CM | POA: Diagnosis not present

## 2020-12-08 DIAGNOSIS — Z992 Dependence on renal dialysis: Secondary | ICD-10-CM | POA: Diagnosis not present

## 2020-12-08 DIAGNOSIS — N186 End stage renal disease: Secondary | ICD-10-CM | POA: Diagnosis not present

## 2020-12-12 DIAGNOSIS — N186 End stage renal disease: Secondary | ICD-10-CM | POA: Diagnosis not present

## 2020-12-12 DIAGNOSIS — Z992 Dependence on renal dialysis: Secondary | ICD-10-CM | POA: Diagnosis not present

## 2020-12-13 DIAGNOSIS — Z992 Dependence on renal dialysis: Secondary | ICD-10-CM | POA: Diagnosis not present

## 2020-12-13 DIAGNOSIS — N186 End stage renal disease: Secondary | ICD-10-CM | POA: Diagnosis not present

## 2020-12-15 DIAGNOSIS — N186 End stage renal disease: Secondary | ICD-10-CM | POA: Diagnosis not present

## 2020-12-15 DIAGNOSIS — Z992 Dependence on renal dialysis: Secondary | ICD-10-CM | POA: Diagnosis not present

## 2020-12-17 DIAGNOSIS — N186 End stage renal disease: Secondary | ICD-10-CM | POA: Diagnosis not present

## 2020-12-17 DIAGNOSIS — Z992 Dependence on renal dialysis: Secondary | ICD-10-CM | POA: Diagnosis not present

## 2020-12-20 DIAGNOSIS — N186 End stage renal disease: Secondary | ICD-10-CM | POA: Diagnosis not present

## 2020-12-20 DIAGNOSIS — Z992 Dependence on renal dialysis: Secondary | ICD-10-CM | POA: Diagnosis not present

## 2020-12-22 DIAGNOSIS — N186 End stage renal disease: Secondary | ICD-10-CM | POA: Diagnosis not present

## 2020-12-22 DIAGNOSIS — Z992 Dependence on renal dialysis: Secondary | ICD-10-CM | POA: Diagnosis not present

## 2020-12-24 DIAGNOSIS — Z992 Dependence on renal dialysis: Secondary | ICD-10-CM | POA: Diagnosis not present

## 2020-12-24 DIAGNOSIS — N186 End stage renal disease: Secondary | ICD-10-CM | POA: Diagnosis not present

## 2020-12-25 DIAGNOSIS — I5031 Acute diastolic (congestive) heart failure: Secondary | ICD-10-CM | POA: Diagnosis not present

## 2020-12-27 DIAGNOSIS — N186 End stage renal disease: Secondary | ICD-10-CM | POA: Diagnosis not present

## 2020-12-27 DIAGNOSIS — Z992 Dependence on renal dialysis: Secondary | ICD-10-CM | POA: Diagnosis not present

## 2020-12-28 ENCOUNTER — Inpatient Hospital Stay: Payer: Medicare HMO

## 2020-12-28 ENCOUNTER — Inpatient Hospital Stay: Payer: Medicare HMO | Attending: Oncology | Admitting: Oncology

## 2020-12-28 ENCOUNTER — Encounter: Payer: Self-pay | Admitting: Oncology

## 2020-12-28 ENCOUNTER — Other Ambulatory Visit: Payer: Self-pay

## 2020-12-28 VITALS — BP 111/69 | HR 77 | Temp 97.4°F | Resp 16 | Wt 174.4 lb

## 2020-12-28 DIAGNOSIS — N186 End stage renal disease: Secondary | ICD-10-CM | POA: Diagnosis not present

## 2020-12-28 DIAGNOSIS — D509 Iron deficiency anemia, unspecified: Secondary | ICD-10-CM

## 2020-12-28 DIAGNOSIS — Z992 Dependence on renal dialysis: Secondary | ICD-10-CM | POA: Diagnosis not present

## 2020-12-28 LAB — IRON AND TIBC
Iron: 89 ug/dL (ref 28–170)
Saturation Ratios: 32 % — ABNORMAL HIGH (ref 10.4–31.8)
TIBC: 277 ug/dL (ref 250–450)
UIBC: 188 ug/dL

## 2020-12-28 LAB — FERRITIN: Ferritin: 396 ng/mL — ABNORMAL HIGH (ref 11–307)

## 2020-12-28 LAB — CBC
HCT: 41.6 % (ref 36.0–46.0)
Hemoglobin: 12.8 g/dL (ref 12.0–15.0)
MCH: 29.8 pg (ref 26.0–34.0)
MCHC: 30.8 g/dL (ref 30.0–36.0)
MCV: 97 fL (ref 80.0–100.0)
Platelets: 210 10*3/uL (ref 150–400)
RBC: 4.29 MIL/uL (ref 3.87–5.11)
RDW: 16.2 % — ABNORMAL HIGH (ref 11.5–15.5)
WBC: 7 10*3/uL (ref 4.0–10.5)
nRBC: 0 % (ref 0.0–0.2)

## 2020-12-28 NOTE — Progress Notes (Signed)
Hematology/Oncology Consult note [facility]  [phone] [phone]  Patient Care Team: Kirk Ruths, MD as PCP - General (Internal Medicine) Sindy Guadeloupe, MD as Consulting Physician (Hematology and Oncology)   Name of the patient: Anna Key  [id number]  1950/05/25   Date of visit: 12/28/20  Diagnosis-anemia likely multifactorial secondary to iron deficiency and chronic kidney disease  Chief complaint/ Reason for visit-routine follow-up of anemia  Heme/Onc history: patient is a 70 year old female with a past medical history significant for hypertension hyperlipidemia, coronary artery disease and stage V chronic kidney disease. She has been referred to [place] for anemia.On review of her outside labs CBC showed H&H of 7.7/25.7 with an MCV of 85.4 on 04/20/2020.  Her hemoglobin has been gradually drifting down from 12 in July 2020 over the last 2 years.  White count and platelets have been normal.  Patient received multiple doses of IV iron between April and August 2022.  She has been on dialysis now since August 2022.  She has been receiving Venofer through dialysis.  Interval history-overall patient reports feeling well.  She is on hemodialysis 3 times a week.  Reports mild fatigue but denies other complaints at this time  ECOG PS- 1 Pain scale- 0   Review of systems- Review of Systems  Constitutional:  Positive for malaise/fatigue. Negative for chills, fever and weight loss.  HENT:  Negative for congestion, ear discharge and nosebleeds.   Eyes:  Negative for blurred vision.  Respiratory:  Negative for cough, hemoptysis, sputum production, shortness of breath and wheezing.   Cardiovascular:  Negative for chest pain, palpitations, orthopnea and claudication.  Gastrointestinal:  Negative for abdominal pain, blood in stool, constipation, diarrhea, heartburn, melena, nausea and vomiting.  Genitourinary:  Negative for dysuria,  flank pain, frequency, hematuria and urgency.  Musculoskeletal:  Negative for back pain, joint pain and myalgias.  Skin:  Negative for rash.  Neurological:  Negative for dizziness, tingling, focal weakness, seizures, weakness and headaches.  Endo/Heme/Allergies:  Does not bruise/bleed easily.  Psychiatric/Behavioral:  Negative for depression and suicidal ideas. The patient does not have insomnia.    Allergies  Allergen Reactions   Penicillins Hives and Rash    Has patient had a PCN reaction causing immediate rash, facial/tongue/throat swelling, SOB or lightheadedness with hypotension: Yes Has patient had a PCN reaction causing severe rash involving mucus membranes or skin necrosis: Yes--all over body Has patient had a PCN reaction that required hospitalization: No  Has patient had a PCN reaction occurring within the last 10 years: No If all of the above answers are "NO", then may proceed with Cephalosporin use.      Past Medical History:  Diagnosis Date   Anemia    Arthritis    osteoarthritis of right knee   CHF (congestive heart failure) (HCC)    Chronic kidney disease    stage 3   Gout    Hepatitis C antibody test positive    Hyperlipidemia    Hypertension      Past Surgical History:  Procedure Laterality Date   A/V FISTULAGRAM N/A 09/22/2020   Procedure: A/V Fistulagram;  Surgeon: Algernon Huxley, MD;  Location: [place] [facility];  Service: Cardiovascular;  Laterality: N/A;   AV FISTULA PLACEMENT Left 11/27/2019   Procedure: ARTERIOVENOUS (AV) Brachiocephalic FISTULA CREATION, possible graft;  Surgeon: Katha Cabal, MD;  Location: [facility];  Service: Vascular;  Laterality: Left;   CARDIAC CATHETERIZATION  LAPAROSCOPIC LYSIS OF ADHESIONS  08/12/2019   Procedure: LAPAROSCOPIC LYSIS OF ADHESIONS;  Surgeon: Jules Husbands, MD;  Location: [facility];  Service: General;;   PARTIAL KNEE ARTHROPLASTY Right 06/11/2017   Procedure: UNICOMPARTMENTAL KNEE;  Surgeon: Corky Mull, MD;  Location: [facility];  Service: Orthopedics;  Laterality: Right;   REPLACEMENT TOTAL KNEE Left 2012   TEMPORARY DIALYSIS CATHETER  09/20/2020   Procedure: TEMPORARY DIALYSIS CATHETER;  Surgeon: Katha Cabal, MD;  Location: [place] [facility];  Service: Cardiovascular;;    Social History   Socioeconomic History   Marital status: Married    Spouse name: Not on file   Number of children: Not on file   Years of education: Not on file   Highest education level: Not on file  Occupational History   Not on file  Tobacco Use   Smoking status: Every Day    Packs/day: 0.50    Types: Cigarettes   Smokeless tobacco: Never  Vaping Use   Vaping Use: Never used  Substance and Sexual Activity   Alcohol use: Never   Drug use: Not Currently    Comment: 30 years ago   Sexual activity: Not on file  Other Topics Concern   Not on file  Social History Narrative   Not on file   Social Determinants of Health   Financial Resource Strain: Not on file  Food Insecurity: Not on file  Transportation Needs: Not on file  Physical Activity: Not on file  Stress: Not on file  Social Connections: Not on file  Intimate Partner Violence: Not on file    Family History  Problem Relation Age of Onset   Colon cancer Mother      Current Outpatient Medications:    allopurinol (ZYLOPRIM) 100 MG tablet, Take 100 mg by mouth every morning. , Disp: , Rfl:    amLODipine (NORVASC) 10 MG tablet, Take 1 tablet (10 mg total) by mouth daily., Disp: 30 tablet, Rfl: 2   atorvastatin (LIPITOR) 40 MG tablet, Take 40 mg by mouth every morning. , Disp: , Rfl:    calcitRIOL (ROCALTROL) 0.25 MCG capsule, Take 0.25 mcg by mouth daily., Disp: , Rfl:    Calcium 500-100 MG-UNIT CHEW, Chew 500 mg by mouth., Disp: , Rfl:    isosorbide mononitrate (IMDUR) 60 MG 24 hr tablet, Take 1 tablet (60 mg total) by mouth every morning., Disp: 30 tablet, Rfl: 2   lidocaine-prilocaine (EMLA) cream, Apply topically., Disp: , Rfl:     losartan (COZAAR) 25 MG tablet, Take 1 tablet by mouth at bedtime., Disp: , Rfl:    metoprolol (TOPROL-XL) 200 MG 24 hr tablet, Take 1 tablet (200 mg total) by mouth every morning., Disp: 30 tablet, Rfl: 2   torsemide 40 MG TABS, Take 40 mg by mouth daily., Disp: 30 tablet, Rfl: 2   aspirin EC 325 MG EC tablet, Take 1 tablet (325 mg total) by mouth daily. (Patient not taking: Reported on 12/28/2020), Disp: 30 tablet, Rfl: 0   losartan (COZAAR) 25 MG tablet, Take 1 tablet (25 mg total) by mouth at bedtime., Disp: 30 tablet, Rfl: 2   polyethylene glycol-electrolytes (NULYTELY) 420 g solution, Take by mouth. (Patient not taking: Reported on 12/28/2020), Disp: , Rfl:   Physical exam:  Vitals:   12/28/20 1005  BP: 111/69  Pulse: 77  Resp: 16  Temp: (!) 97.4 F (36.3 C)  SpO2: 97%  Weight: 174 lb 6.4 oz (79.1 kg)   Physical Exam Constitutional:  General: She is not in acute distress. Cardiovascular:     Rate and Rhythm: Normal rate and regular rhythm.     Heart sounds: Normal heart sounds.  Pulmonary:     Effort: Pulmonary effort is normal.     Breath sounds: Normal breath sounds.  Abdominal:     General: Bowel sounds are normal.     Palpations: Abdomen is soft.  Skin:    General: Skin is warm and dry.  Neurological:     Mental Status: She is alert and oriented to person, place, and time.     CMP Latest Ref Rng & Units 09/24/2020  Glucose 70 - 99 mg/dL 88  BUN 8 - 23 mg/dL 25(H)  Creatinine 0.44 - 1.00 mg/dL 3.48(H)  Sodium 135 - 145 mmol/L 135  Potassium 3.5 - 5.1 mmol/L 3.9  Chloride 98 - 111 mmol/L 100  CO2 22 - 32 mmol/L 27  Calcium 8.9 - 10.3 mg/dL 7.8(L)  Total Protein 6.5 - 8.1 g/dL -  Total Bilirubin 0.3 - 1.2 mg/dL -  Alkaline Phos 38 - 126 U/L -  AST 15 - 41 U/L -  ALT 0 - 44 U/L -   CBC Latest Ref Rng & Units 12/28/2020  WBC 4.0 - 10.5 K/uL 7.0  Hemoglobin 12.0 - 15.0 g/dL 12.8  Hematocrit 36.0 - 46.0 % 41.6  Platelets 150 - 400 K/uL 210      Assessment and plan- Patient is a 70 y.o. female with chronic kidney disease on dialysis referred for anemia  Patient's hemoglobin is now normalized to 12.8.  She received IV iron through was in the past but is currently on hemodialysis and receiving Venofer through dialysis.  If her hemoglobin drops down to less than 10 she can also receive EPO through dialysis.  She will therefore follow-up with me in 6 months with CBC ferritin and iron studies will follow-up with [place] infrequently at this time and I will see her back in   Visit Diagnosis 1. Iron deficiency anemia, unspecified iron deficiency anemia type      Dr. Randa Evens, MD, MPH [facility] [phone] 12/28/2020 3:28 PM

## 2020-12-29 DIAGNOSIS — Z992 Dependence on renal dialysis: Secondary | ICD-10-CM | POA: Diagnosis not present

## 2020-12-29 DIAGNOSIS — N186 End stage renal disease: Secondary | ICD-10-CM | POA: Diagnosis not present

## 2020-12-30 DIAGNOSIS — N185 Chronic kidney disease, stage 5: Secondary | ICD-10-CM | POA: Diagnosis not present

## 2020-12-30 DIAGNOSIS — F17210 Nicotine dependence, cigarettes, uncomplicated: Secondary | ICD-10-CM | POA: Diagnosis not present

## 2020-12-30 DIAGNOSIS — Z992 Dependence on renal dialysis: Secondary | ICD-10-CM | POA: Diagnosis not present

## 2020-12-30 DIAGNOSIS — I1311 Hypertensive heart and chronic kidney disease without heart failure, with stage 5 chronic kidney disease, or end stage renal disease: Secondary | ICD-10-CM | POA: Diagnosis not present

## 2020-12-30 DIAGNOSIS — I25119 Atherosclerotic heart disease of native coronary artery with unspecified angina pectoris: Secondary | ICD-10-CM | POA: Diagnosis not present

## 2020-12-31 DIAGNOSIS — N186 End stage renal disease: Secondary | ICD-10-CM | POA: Diagnosis not present

## 2020-12-31 DIAGNOSIS — Z992 Dependence on renal dialysis: Secondary | ICD-10-CM | POA: Diagnosis not present

## 2021-01-02 DIAGNOSIS — Z992 Dependence on renal dialysis: Secondary | ICD-10-CM | POA: Diagnosis not present

## 2021-01-02 DIAGNOSIS — N186 End stage renal disease: Secondary | ICD-10-CM | POA: Diagnosis not present

## 2021-01-07 DIAGNOSIS — N186 End stage renal disease: Secondary | ICD-10-CM | POA: Diagnosis not present

## 2021-01-07 DIAGNOSIS — Z992 Dependence on renal dialysis: Secondary | ICD-10-CM | POA: Diagnosis not present

## 2021-01-10 DIAGNOSIS — N186 End stage renal disease: Secondary | ICD-10-CM | POA: Diagnosis not present

## 2021-01-10 DIAGNOSIS — Z992 Dependence on renal dialysis: Secondary | ICD-10-CM | POA: Diagnosis not present

## 2021-01-11 DIAGNOSIS — Z992 Dependence on renal dialysis: Secondary | ICD-10-CM | POA: Diagnosis not present

## 2021-01-11 DIAGNOSIS — N186 End stage renal disease: Secondary | ICD-10-CM | POA: Diagnosis not present

## 2021-01-12 DIAGNOSIS — Z992 Dependence on renal dialysis: Secondary | ICD-10-CM | POA: Diagnosis not present

## 2021-01-12 DIAGNOSIS — N186 End stage renal disease: Secondary | ICD-10-CM | POA: Diagnosis not present

## 2021-01-14 DIAGNOSIS — Z992 Dependence on renal dialysis: Secondary | ICD-10-CM | POA: Diagnosis not present

## 2021-01-14 DIAGNOSIS — N186 End stage renal disease: Secondary | ICD-10-CM | POA: Diagnosis not present

## 2021-01-19 DIAGNOSIS — Z992 Dependence on renal dialysis: Secondary | ICD-10-CM | POA: Diagnosis not present

## 2021-01-19 DIAGNOSIS — N186 End stage renal disease: Secondary | ICD-10-CM | POA: Diagnosis not present

## 2021-01-21 DIAGNOSIS — N186 End stage renal disease: Secondary | ICD-10-CM | POA: Diagnosis not present

## 2021-01-21 DIAGNOSIS — Z992 Dependence on renal dialysis: Secondary | ICD-10-CM | POA: Diagnosis not present

## 2021-01-25 DIAGNOSIS — Z992 Dependence on renal dialysis: Secondary | ICD-10-CM | POA: Diagnosis not present

## 2021-01-25 DIAGNOSIS — N186 End stage renal disease: Secondary | ICD-10-CM | POA: Diagnosis not present

## 2021-01-26 DIAGNOSIS — Z992 Dependence on renal dialysis: Secondary | ICD-10-CM | POA: Diagnosis not present

## 2021-01-26 DIAGNOSIS — N186 End stage renal disease: Secondary | ICD-10-CM | POA: Diagnosis not present

## 2021-01-28 DIAGNOSIS — Z992 Dependence on renal dialysis: Secondary | ICD-10-CM | POA: Diagnosis not present

## 2021-01-28 DIAGNOSIS — N186 End stage renal disease: Secondary | ICD-10-CM | POA: Diagnosis not present

## 2021-01-31 DIAGNOSIS — N186 End stage renal disease: Secondary | ICD-10-CM | POA: Diagnosis not present

## 2021-01-31 DIAGNOSIS — Z992 Dependence on renal dialysis: Secondary | ICD-10-CM | POA: Diagnosis not present

## 2021-02-02 DIAGNOSIS — Z992 Dependence on renal dialysis: Secondary | ICD-10-CM | POA: Diagnosis not present

## 2021-02-02 DIAGNOSIS — N186 End stage renal disease: Secondary | ICD-10-CM | POA: Diagnosis not present

## 2021-02-08 DIAGNOSIS — Z992 Dependence on renal dialysis: Secondary | ICD-10-CM | POA: Diagnosis not present

## 2021-02-08 DIAGNOSIS — N186 End stage renal disease: Secondary | ICD-10-CM | POA: Diagnosis not present

## 2021-02-09 DIAGNOSIS — N186 End stage renal disease: Secondary | ICD-10-CM | POA: Diagnosis not present

## 2021-02-09 DIAGNOSIS — Z992 Dependence on renal dialysis: Secondary | ICD-10-CM | POA: Diagnosis not present

## 2021-02-11 DIAGNOSIS — Z992 Dependence on renal dialysis: Secondary | ICD-10-CM | POA: Diagnosis not present

## 2021-02-11 DIAGNOSIS — N186 End stage renal disease: Secondary | ICD-10-CM | POA: Diagnosis not present

## 2021-02-14 DIAGNOSIS — N186 End stage renal disease: Secondary | ICD-10-CM | POA: Diagnosis not present

## 2021-02-14 DIAGNOSIS — Z992 Dependence on renal dialysis: Secondary | ICD-10-CM | POA: Diagnosis not present

## 2021-02-16 DIAGNOSIS — N186 End stage renal disease: Secondary | ICD-10-CM | POA: Diagnosis not present

## 2021-02-16 DIAGNOSIS — Z992 Dependence on renal dialysis: Secondary | ICD-10-CM | POA: Diagnosis not present

## 2021-02-18 DIAGNOSIS — N186 End stage renal disease: Secondary | ICD-10-CM | POA: Diagnosis not present

## 2021-02-18 DIAGNOSIS — Z992 Dependence on renal dialysis: Secondary | ICD-10-CM | POA: Diagnosis not present

## 2021-02-22 DIAGNOSIS — Z992 Dependence on renal dialysis: Secondary | ICD-10-CM | POA: Diagnosis not present

## 2021-02-22 DIAGNOSIS — N186 End stage renal disease: Secondary | ICD-10-CM | POA: Diagnosis not present

## 2021-02-23 DIAGNOSIS — Z992 Dependence on renal dialysis: Secondary | ICD-10-CM | POA: Diagnosis not present

## 2021-02-23 DIAGNOSIS — N186 End stage renal disease: Secondary | ICD-10-CM | POA: Diagnosis not present

## 2021-02-25 DIAGNOSIS — N186 End stage renal disease: Secondary | ICD-10-CM | POA: Diagnosis not present

## 2021-02-25 DIAGNOSIS — Z992 Dependence on renal dialysis: Secondary | ICD-10-CM | POA: Diagnosis not present

## 2021-02-28 DIAGNOSIS — Z992 Dependence on renal dialysis: Secondary | ICD-10-CM | POA: Diagnosis not present

## 2021-02-28 DIAGNOSIS — N186 End stage renal disease: Secondary | ICD-10-CM | POA: Diagnosis not present

## 2021-03-02 DIAGNOSIS — Z992 Dependence on renal dialysis: Secondary | ICD-10-CM | POA: Diagnosis not present

## 2021-03-02 DIAGNOSIS — N186 End stage renal disease: Secondary | ICD-10-CM | POA: Diagnosis not present

## 2021-03-04 DIAGNOSIS — Z992 Dependence on renal dialysis: Secondary | ICD-10-CM | POA: Diagnosis not present

## 2021-03-04 DIAGNOSIS — N186 End stage renal disease: Secondary | ICD-10-CM | POA: Diagnosis not present

## 2021-03-07 DIAGNOSIS — N186 End stage renal disease: Secondary | ICD-10-CM | POA: Diagnosis not present

## 2021-03-07 DIAGNOSIS — Z992 Dependence on renal dialysis: Secondary | ICD-10-CM | POA: Diagnosis not present

## 2021-03-09 DIAGNOSIS — N186 End stage renal disease: Secondary | ICD-10-CM | POA: Diagnosis not present

## 2021-03-09 DIAGNOSIS — Z992 Dependence on renal dialysis: Secondary | ICD-10-CM | POA: Diagnosis not present

## 2021-03-11 DIAGNOSIS — N186 End stage renal disease: Secondary | ICD-10-CM | POA: Diagnosis not present

## 2021-03-11 DIAGNOSIS — Z992 Dependence on renal dialysis: Secondary | ICD-10-CM | POA: Diagnosis not present

## 2021-03-13 DIAGNOSIS — D508 Other iron deficiency anemias: Secondary | ICD-10-CM | POA: Diagnosis not present

## 2021-03-13 DIAGNOSIS — Z800 Family history of malignant neoplasm of digestive organs: Secondary | ICD-10-CM | POA: Diagnosis not present

## 2021-03-14 DIAGNOSIS — Z992 Dependence on renal dialysis: Secondary | ICD-10-CM | POA: Diagnosis not present

## 2021-03-14 DIAGNOSIS — N186 End stage renal disease: Secondary | ICD-10-CM | POA: Diagnosis not present

## 2021-03-16 DIAGNOSIS — N186 End stage renal disease: Secondary | ICD-10-CM | POA: Diagnosis not present

## 2021-03-16 DIAGNOSIS — Z992 Dependence on renal dialysis: Secondary | ICD-10-CM | POA: Diagnosis not present

## 2021-03-18 DIAGNOSIS — N186 End stage renal disease: Secondary | ICD-10-CM | POA: Diagnosis not present

## 2021-03-18 DIAGNOSIS — Z992 Dependence on renal dialysis: Secondary | ICD-10-CM | POA: Diagnosis not present

## 2021-03-21 ENCOUNTER — Encounter: Payer: Self-pay | Admitting: Internal Medicine

## 2021-03-22 ENCOUNTER — Ambulatory Visit: Payer: Medicare HMO | Admitting: Certified Registered Nurse Anesthetist

## 2021-03-22 ENCOUNTER — Encounter
Admission: RE | Disposition: A | Discharge status: Home / Self Care | Payer: Self-pay | Source: Home / Self Care | Attending: Internal Medicine

## 2021-03-22 ENCOUNTER — Encounter: Payer: Self-pay | Admitting: Internal Medicine

## 2021-03-22 ENCOUNTER — Ambulatory Visit
Admission: RE | Admit: 2021-03-22 | Discharge: 2021-03-22 | Disposition: A | Discharge status: Home / Self Care | Payer: Medicare HMO | Attending: Internal Medicine | Admitting: Internal Medicine

## 2021-03-22 DIAGNOSIS — D126 Benign neoplasm of colon, unspecified: Secondary | ICD-10-CM | POA: Diagnosis not present

## 2021-03-22 DIAGNOSIS — D125 Benign neoplasm of sigmoid colon: Secondary | ICD-10-CM | POA: Diagnosis not present

## 2021-03-22 DIAGNOSIS — D120 Benign neoplasm of cecum: Secondary | ICD-10-CM | POA: Diagnosis not present

## 2021-03-22 DIAGNOSIS — K642 Third degree hemorrhoids: Secondary | ICD-10-CM | POA: Diagnosis not present

## 2021-03-22 DIAGNOSIS — D122 Benign neoplasm of ascending colon: Secondary | ICD-10-CM | POA: Insufficient documentation

## 2021-03-22 DIAGNOSIS — I509 Heart failure, unspecified: Secondary | ICD-10-CM | POA: Diagnosis not present

## 2021-03-22 DIAGNOSIS — D123 Benign neoplasm of transverse colon: Secondary | ICD-10-CM | POA: Diagnosis not present

## 2021-03-22 DIAGNOSIS — I2510 Atherosclerotic heart disease of native coronary artery without angina pectoris: Secondary | ICD-10-CM | POA: Insufficient documentation

## 2021-03-22 DIAGNOSIS — K2970 Gastritis, unspecified, without bleeding: Secondary | ICD-10-CM | POA: Diagnosis not present

## 2021-03-22 DIAGNOSIS — Z86010 Personal history of colonic polyps: Secondary | ICD-10-CM | POA: Diagnosis not present

## 2021-03-22 DIAGNOSIS — Z87891 Personal history of nicotine dependence: Secondary | ICD-10-CM | POA: Insufficient documentation

## 2021-03-22 DIAGNOSIS — Z96652 Presence of left artificial knee joint: Secondary | ICD-10-CM | POA: Insufficient documentation

## 2021-03-22 DIAGNOSIS — Z800 Family history of malignant neoplasm of digestive organs: Secondary | ICD-10-CM | POA: Insufficient documentation

## 2021-03-22 DIAGNOSIS — N183 Chronic kidney disease, stage 3 unspecified: Secondary | ICD-10-CM | POA: Insufficient documentation

## 2021-03-22 DIAGNOSIS — J449 Chronic obstructive pulmonary disease, unspecified: Secondary | ICD-10-CM | POA: Insufficient documentation

## 2021-03-22 DIAGNOSIS — K5730 Diverticulosis of large intestine without perforation or abscess without bleeding: Secondary | ICD-10-CM | POA: Insufficient documentation

## 2021-03-22 DIAGNOSIS — I130 Hypertensive heart and chronic kidney disease with heart failure and stage 1 through stage 4 chronic kidney disease, or unspecified chronic kidney disease: Secondary | ICD-10-CM | POA: Insufficient documentation

## 2021-03-22 DIAGNOSIS — D509 Iron deficiency anemia, unspecified: Secondary | ICD-10-CM | POA: Diagnosis not present

## 2021-03-22 DIAGNOSIS — K641 Second degree hemorrhoids: Secondary | ICD-10-CM | POA: Diagnosis not present

## 2021-03-22 DIAGNOSIS — E785 Hyperlipidemia, unspecified: Secondary | ICD-10-CM | POA: Diagnosis not present

## 2021-03-22 DIAGNOSIS — Z1211 Encounter for screening for malignant neoplasm of colon: Secondary | ICD-10-CM | POA: Diagnosis not present

## 2021-03-22 DIAGNOSIS — K644 Residual hemorrhoidal skin tags: Secondary | ICD-10-CM | POA: Diagnosis not present

## 2021-03-22 DIAGNOSIS — K635 Polyp of colon: Secondary | ICD-10-CM | POA: Diagnosis not present

## 2021-03-22 HISTORY — PX: ESOPHAGOGASTRODUODENOSCOPY: SHX5428

## 2021-03-22 HISTORY — PX: COLONOSCOPY WITH PROPOFOL: SHX5780

## 2021-03-22 LAB — POCT I-STAT, CHEM 8
BUN: 74 mg/dL — ABNORMAL HIGH (ref 8–23)
Calcium, Ion: 1.05 mmol/L — ABNORMAL LOW (ref 1.15–1.40)
Chloride: 110 mmol/L (ref 98–111)
Creatinine, Ser: 6.3 mg/dL — ABNORMAL HIGH (ref 0.44–1.00)
Glucose, Bld: 89 mg/dL (ref 70–99)
HCT: 39 % (ref 36.0–46.0)
Hemoglobin: 13.3 g/dL (ref 12.0–15.0)
Potassium: 4.6 mmol/L (ref 3.5–5.1)
Sodium: 143 mmol/L (ref 135–145)
TCO2: 18 mmol/L — ABNORMAL LOW (ref 22–32)

## 2021-03-22 SURGERY — COLONOSCOPY WITH PROPOFOL
Anesthesia: General

## 2021-03-22 MED ORDER — PROPOFOL 500 MG/50ML IV EMUL
INTRAVENOUS | Status: DC | PRN
  Administered 2021-03-22: 50 ug/kg/min via INTRAVENOUS

## 2021-03-22 MED ORDER — ESMOLOL HCL 100 MG/10ML IV SOLN
INTRAVENOUS | Status: AC
  Filled 2021-03-22: qty 10

## 2021-03-22 MED ORDER — ESMOLOL HCL 100 MG/10ML IV SOLN
INTRAVENOUS | Status: DC | PRN
  Administered 2021-03-22: 10 mg via INTRAVENOUS

## 2021-03-22 MED ORDER — PROPOFOL 10 MG/ML IV BOLUS
INTRAVENOUS | Status: DC | PRN
  Administered 2021-03-22: 70 mg via INTRAVENOUS
  Administered 2021-03-22 (×4): 30 mg via INTRAVENOUS

## 2021-03-22 MED ORDER — SODIUM CHLORIDE 0.9 % IV SOLN
INTRAVENOUS | Status: DC
  Administered 2021-03-22: 1000 mL via INTRAVENOUS

## 2021-03-22 MED ORDER — LIDOCAINE HCL (CARDIAC) PF 100 MG/5ML IV SOSY
PREFILLED_SYRINGE | INTRAVENOUS | Status: DC | PRN
  Administered 2021-03-22: 70 mg via INTRAVENOUS

## 2021-03-22 NOTE — Op Note (Signed)
[facility] Gastroenterology Patient Name: Anna Key Procedure Date: 03/22/2021 9:58 AM MRN: [id number] Account #: [id number] Date of Birth: 11-08-1950 Admit Type: Outpatient Age: 71 Room: [facility] ENDO ROOM 2 Gender: Female Note Status: Finalized Instrument Name: Jasper Riling [id number] Procedure:             Colonoscopy Indications:           Surveillance: Personal history of adenomatous polyps                         on last colonoscopy > 5 years ago Providers:             Lorie Apley K. Dorris Pierre MD, MD Medicines:             Propofol per Anesthesia Complications:         No immediate complications. Procedure:             Pre-Anesthesia Assessment:                        - The risks and benefits of the procedure and the                         sedation options and risks were discussed with the                         patient. All questions were answered and informed                         consent was obtained.                        - Patient identification and proposed procedure were                         verified prior to the procedure by the nurse. The                         procedure was verified in the procedure room.                        - ASA Grade Assessment: III - A patient with severe                         systemic disease.                        - After reviewing the risks and benefits, the patient                         was deemed in satisfactory condition to undergo the                         procedure.                        After obtaining informed consent, the colonoscope was                         passed under direct vision. Throughout the procedure,  the patient's blood pressure, pulse, and oxygen                         saturations were monitored continuously. The                         Colonoscope was introduced through the anus and                         advanced to the the cecum, identified by appendiceal                          orifice and ileocecal valve. The colonoscopy was                         performed without difficulty. The patient tolerated                         the procedure well. The quality of the bowel                         preparation was adequate. The ileocecal valve,                         appendiceal orifice, and rectum were photographed. Findings:      The perianal exam findings include internal hemorrhoids that prolapse       with straining, but require manual replacement into the anal canal       (Grade III).      Non-bleeding internal hemorrhoids were found during retroflexion. The       hemorrhoids were Grade II (internal hemorrhoids that prolapse but reduce       spontaneously).      Many small-mouthed diverticula were found in the sigmoid colon. There       was no evidence of diverticular bleeding.      Three sessile polyps were found in the cecum. The polyps were 3 to 6 mm       in size. These polyps were removed with a jumbo cold forceps. Resection       and retrieval were complete.      Two sessile polyps were found in the cecum. The polyps were 7 to 14 mm       in size. These polyps were removed with a hot snare. Resection and       retrieval were complete. To prevent bleeding after the polypectomy, two       hemostatic clips were successfully placed (MR conditional). There was no       bleeding during, or at the end, of the procedure.      Two sessile polyps were found in the proximal ascending colon. The       polyps were 8 to 12 mm in size. These polyps were removed with a hot       snare. Resection and retrieval were complete.      A 4 mm polyp was found in the hepatic flexure. The polyp was sessile.       The polyp was removed with a jumbo cold forceps. Resection and retrieval       were complete.      Two sessile polyps were found in the hepatic flexure. The polyps were 7  to 10 mm in size. These polyps were removed with a hot snare. Resection        and retrieval were complete.      A 5 mm polyp was found in the transverse colon. The polyp was sessile.       The polyp was removed with a jumbo cold forceps. Resection and retrieval       were complete.      A 10 mm polyp was found in the distal transverse colon. The polyp was       sessile. The polyp was removed with a hot snare. Resection and retrieval       were complete.      A 7 mm polyp was found in the sigmoid colon. The polyp was sessile. The       polyp was removed with a hot snare. Resection and retrieval were       complete.      The exam was otherwise without abnormality. Impression:            - Internal hemorrhoids that prolapse with straining,                         but require manual replacement into the anal canal                         (Grade III) found on perianal exam.                        - Non-bleeding internal hemorrhoids.                        - Mild diverticulosis in the sigmoid colon. There was                         no evidence of diverticular bleeding.                        - Three 3 to 6 mm polyps in the cecum, removed with a                         jumbo cold forceps. Resected and retrieved.                        - Two 7 to 14 mm polyps in the cecum, removed with a                         hot snare. Resected and retrieved. Clips (MR                         conditional) were placed.                        - Two 8 to 12 mm polyps in the proximal ascending                         colon, removed with a hot snare. Resected and                         retrieved.                        -  One 4 mm polyp at the hepatic flexure, removed with                         a jumbo cold forceps. Resected and retrieved.                        - Two 7 to 10 mm polyps at the hepatic flexure,                         removed with a hot snare. Resected and retrieved.                        - One 5 mm polyp in the transverse colon, removed with                         a  jumbo cold forceps. Resected and retrieved.                        - One 10 mm polyp in the distal transverse colon,                         removed with a hot snare. Resected and retrieved.                        - One 7 mm polyp in the sigmoid colon, removed with a                         hot snare. Resected and retrieved.                        - The examination was otherwise normal. Recommendation:        - Patient has a contact number available for                         emergencies. The signs and symptoms of potential                         delayed complications were discussed with the patient.                         Return to normal activities tomorrow. Written                         discharge instructions were provided to the patient.                        - Resume previous diet.                        - Continue present medications.                        - Repeat colonoscopy is recommended for surveillance.                         The colonoscopy date will be determined after  pathology results from today's exam become available                         for review.                        - Consider capsule endoscopy of the small intestine                         for further evaluation if warranted.                        - Return to physician assistant in 2 months.                        - Telephone GI clinic to schedule appointment.                        - The findings and recommendations were discussed with                         the patient. Procedure Code(s):     --- Professional ---                        [phone], Colonoscopy, flexible; with removal of                         tumor(s), polyp(s), or other lesion(s) by snare                         technique                        45380, 74, Colonoscopy, flexible; with biopsy, single                         or multiple Diagnosis Code(s):     --- Professional ---                        K57.30,  Diverticulosis of large intestine without                         perforation or abscess without bleeding                        K64.2, Third degree hemorrhoids                        Z86.010, Personal history of colonic polyps                        K63.5, Polyp of colon CPT copyright 2019 American [employer] Association. All rights reserved. The codes documented in this report are preliminary and upon coder review may  be revised to meet current compliance requirements. Efrain Sella MD, MD 03/22/2021 11:06:22 AM This report has been signed electronically. Number of Addenda: 0 Note Initiated On: 03/22/2021 9:58 AM Scope Withdrawal Time: 0 hours 27 minutes 59 seconds  Total Procedure Duration: 0 hours 33 minutes 41 seconds  Estimated Blood Loss:  Estimated blood loss: none.      [facility]

## 2021-03-22 NOTE — Transfer of Care (Signed)
Immediate Anesthesia Transfer of Care Note  Patient: Anna Key  Procedure(s) Performed: COLONOSCOPY WITH PROPOFOL ESOPHAGOGASTRODUODENOSCOPY (EGD)  Patient Location: PACU and Endoscopy Unit  Anesthesia Type:General  Level of Consciousness: awake, drowsy and patient cooperative  Airway & Oxygen Therapy: Patient Spontanous Breathing  Post-op Assessment: Report given to RN and Post -op Vital signs reviewed and stable  Post vital signs: Reviewed and stable  Last Vitals:  Vitals Value Taken Time  BP 139/79 03/22/21 1101  Temp 36.1 C 03/22/21 1101  Pulse 72 03/22/21 1101  Resp    SpO2 94 % 03/22/21 1101    Last Pain:  Vitals:   03/22/21 1101  TempSrc: Temporal  PainSc: 0-No pain         Complications: No notable events documented.

## 2021-03-22 NOTE — H&P (Signed)
Outpatient short stay form Pre-procedure 03/22/2021 9:28 AM Nalla Purdy K. Alice Reichert, M.D.  Primary Physician: Frazier Richards, M.D.  Reason for visit:  Iron deficiency anemia, family history of colon cancer (Mother).  History of present illness:  Patient presents for diagnosis of progressive anemia and found to have iron deficiency. Has no complaints of upper symptoms such as anorexia, abdominal pain, severe GERD, dysphagia, hemetemesis, melena, nausea or vomiting. Patient denies change in bowel habits, rectal bleeding, weight loss or abdominal pain.    71 year old patient presenting for family history of colon cancer. Patient denies any change in bowel habits, rectal bleeding or involuntary weight loss.    No current facility-administered medications for this encounter.  Medications Prior to Admission  Medication Sig Dispense Refill Last Dose   allopurinol (ZYLOPRIM) 100 MG tablet Take 100 mg by mouth every morning.    Past Week   aspirin EC 325 MG EC tablet Take 1 tablet (325 mg total) by mouth daily. 30 tablet 0 Past Week   atorvastatin (LIPITOR) 40 MG tablet Take 40 mg by mouth every morning.    03/21/2021   calcitRIOL (ROCALTROL) 0.25 MCG capsule Take 0.25 mcg by mouth daily.   Past Week   Calcium 500-100 MG-UNIT CHEW Chew 500 mg by mouth.   Past Week   losartan (COZAAR) 25 MG tablet Take 1 tablet by mouth at bedtime.   Past Week   polyethylene glycol-electrolytes (NULYTELY) 420 g solution Take by mouth.   03/21/2021   amLODipine (NORVASC) 10 MG tablet Take 1 tablet (10 mg total) by mouth daily. 30 tablet 2    isosorbide mononitrate (IMDUR) 60 MG 24 hr tablet Take 1 tablet (60 mg total) by mouth every morning. 30 tablet 2    lidocaine-prilocaine (EMLA) cream Apply topically.      losartan (COZAAR) 25 MG tablet Take 1 tablet (25 mg total) by mouth at bedtime. 30 tablet 2    metoprolol (TOPROL-XL) 200 MG 24 hr tablet Take 1 tablet (200 mg total) by mouth every morning. 30 tablet 2    torsemide  40 MG TABS Take 40 mg by mouth daily. 30 tablet 2      Allergies  Allergen Reactions   Penicillins Hives and Rash    Has patient had a PCN reaction causing immediate rash, facial/tongue/throat swelling, SOB or lightheadedness with hypotension: Yes Has patient had a PCN reaction causing severe rash involving mucus membranes or skin necrosis: Yes--all over body Has patient had a PCN reaction that required hospitalization: No  Has patient had a PCN reaction occurring within the last 10 years: No If all of the above answers are "NO", then may proceed with Cephalosporin use.      Past Medical History:  Diagnosis Date   Anemia    Arthritis    osteoarthritis of right knee   CHF (congestive heart failure) (HCC)    Chronic kidney disease    stage 3   Gout    Hepatitis C antibody test positive    Hyperlipidemia    Hypertension     Review of systems:  Otherwise negative.    Physical Exam  Gen: Alert, oriented. Appears stated age.  HEENT: [place]/AT. PERRLA. Lungs: CTA, no wheezes. CV: RR nl S1, S2. Abd: soft, benign, no masses. BS+ Ext: No edema. Pulses 2+    Planned procedures: Proceed with egd and colonoscopy. The patient understands the nature of the planned procedure, indications, risks, alternatives and potential complications including but not limited to bleeding, infection,  perforation, damage to internal organs and possible oversedation/side effects from anesthesia. The patient agrees and gives consent to proceed.  Please refer to procedure notes for findings, recommendations and patient disposition/instructions.     Haivyn Oravec K. Alice Reichert, M.D. Gastroenterology 03/22/2021  9:28 AM

## 2021-03-22 NOTE — Op Note (Signed)
[facility] Gastroenterology Patient Name: Anna Key Procedure Date: 03/22/2021 9:58 AM MRN: [id number] Account #: [id number] Date of Birth: [birth year] Admit Type: Outpatient Age: 71 Room: [facility] ENDO ROOM 2 Gender: Female Note Status: Finalized Instrument Name: Upper Endoscope [id number] Procedure:             Upper GI endoscopy Indications:           Unexplained iron deficiency anemia Providers:             Benay Pike. Koron Godeaux MD, MD Medicines:             Propofol per Anesthesia Complications:         No immediate complications. Procedure:             Pre-Anesthesia Assessment:                        - The risks and benefits of the procedure and the                         sedation options and risks were discussed with the                         patient. All questions were answered and informed                         consent was obtained.                        - Patient identification and proposed procedure were                         verified prior to the procedure by the nurse. The                         procedure was verified in the procedure room.                        - ASA Grade Assessment: III - A patient with severe                         systemic disease.                        - After reviewing the risks and benefits, the patient                         was deemed in satisfactory condition to undergo the                         procedure.                        After obtaining informed consent, the endoscope was                         passed under direct vision. Throughout the procedure,                         the patient's blood pressure, pulse, and oxygen  saturations were monitored continuously. The Endoscope                         was introduced through the mouth, and advanced to the                         third part of duodenum. The upper GI endoscopy was                         accomplished without difficulty.  The patient tolerated                         the procedure well. Findings:      The esophagus was normal.      Patchy mild inflammation characterized by erythema was found in the       gastric antrum.      The cardia and gastric fundus were normal on retroflexion.      The examined duodenum was normal.      The exam was otherwise without abnormality. Impression:            - Normal esophagus.                        - Gastritis.                        - Normal examined duodenum.                        - The examination was otherwise normal.                        - No specimens collected. Recommendation:        - Proceed with colonoscopy Procedure Code(s):     --- Professional ---                        [phone], Esophagogastroduodenoscopy, flexible,                         transoral; diagnostic, including collection of                         specimen(s) by brushing or washing, when performed                         (separate procedure) Diagnosis Code(s):     --- Professional ---                        D50.9, Iron deficiency anemia, unspecified                        K29.70, Gastritis, unspecified, without bleeding CPT copyright 2019 American Medical Association. All rights reserved. The codes documented in this report are preliminary and upon coder review may  be revised to meet current compliance requirements. Efrain Sella MD, MD 03/22/2021 10:19:54 AM This report has been signed electronically. Number of Addenda: 0 Note Initiated On: 03/22/2021 9:58 AM Estimated Blood Loss:  Estimated blood loss: none.      [facility]

## 2021-03-22 NOTE — Interval H&P Note (Signed)
History and Physical Interval Note:  03/22/2021 9:29 AM  Anna Key  has presented today for surgery, with the diagnosis of IDA FM HX COLON CA.  The various methods of treatment have been discussed with the patient and family. After consideration of risks, benefits and other options for treatment, the patient has consented to  Procedure(s): COLONOSCOPY WITH PROPOFOL (N/A) ESOPHAGOGASTRODUODENOSCOPY (EGD) (N/A) as a surgical intervention.  The patient's history has been reviewed, patient examined, no change in status, stable for surgery.  I have reviewed the patient's chart and labs.  Questions were answered to the patient's satisfaction.     [place], Jay

## 2021-03-22 NOTE — Anesthesia Postprocedure Evaluation (Signed)
Anesthesia Post Note  Patient: Anna Key  Procedure(s) Performed: COLONOSCOPY WITH PROPOFOL ESOPHAGOGASTRODUODENOSCOPY (EGD)  Patient location during evaluation: Endoscopy Anesthesia Type: General Level of consciousness: awake and alert Pain management: pain level controlled Vital Signs Assessment: post-procedure vital signs reviewed and stable Respiratory status: spontaneous breathing, nonlabored ventilation, respiratory function stable and patient connected to nasal cannula oxygen Cardiovascular status: blood pressure returned to baseline and stable Postop Assessment: no apparent nausea or vomiting Anesthetic complications: no   No notable events documented.   Last Vitals:  Vitals:   03/22/21 1111 03/22/21 1121  BP: (!) 159/79 (!) 165/77  Pulse: 66 68  Resp: 17 (!) 23  Temp:    SpO2: 96% 93%    Last Pain:  Vitals:   03/22/21 1121  TempSrc:   PainSc: 0-No pain                 Precious Haws [name]

## 2021-03-22 NOTE — Anesthesia Preprocedure Evaluation (Signed)
Anesthesia Evaluation  Patient identified by MRN, date of birth, ID band Patient awake    Reviewed: Allergy & Precautions, NPO status , Patient's Chart, lab work & pertinent test results  History of Anesthesia Complications Negative for: history of anesthetic complications  Airway Mallampati: III  TM Distance: >3 FB Neck ROM: full    Dental  (+) Chipped, Poor Dentition, Missing   Pulmonary neg shortness of breath, COPD, Current Smoker and Patient abstained from smoking.,    Pulmonary exam normal        Cardiovascular hypertension, (-) angina+ CAD and +CHF  Normal cardiovascular exam     Neuro/Psych negative neurological ROS  negative psych ROS   GI/Hepatic negative GI ROS, Neg liver ROS, neg GERD  ,(+) Hepatitis -  Endo/Other  negative endocrine ROS  Renal/GU DialysisRenal disease  negative genitourinary   Musculoskeletal  (+) Arthritis ,   Abdominal   Peds  Hematology negative hematology ROS (+)   Anesthesia Other Findings Past Medical History: No date: Anemia No date: Arthritis     Comment:  osteoarthritis of right knee No date: CHF (congestive heart failure) (HCC) No date: Chronic kidney disease     Comment:  stage 3 No date: Gout No date: Hepatitis C antibody test positive No date: Hyperlipidemia No date: Hypertension  Past Surgical History: 09/22/2020: A/V FISTULAGRAM; N/A     Comment:  Procedure: A/V Fistulagram;  Surgeon: Algernon Huxley, MD;               Location: [facility];  Service: Cardiovascular;              Laterality: N/A; 11/27/2019: AV FISTULA PLACEMENT; Left     Comment:  Procedure: ARTERIOVENOUS (AV) Brachiocephalic FISTULA               CREATION, possible graft;  Surgeon: Katha Cabal,               MD;  Location: [facility];  Service: Vascular;  Laterality:              Left; No date: CARDIAC CATHETERIZATION No date: DIAGNOSTIC LAPAROSCOPY 08/12/2019: LAPAROSCOPIC  LYSIS OF ADHESIONS     Comment:  Procedure: LAPAROSCOPIC LYSIS OF ADHESIONS;  Surgeon:               Jules Husbands, MD;  Location: [facility];  Service:               General;; 06/11/2017: PARTIAL KNEE ARTHROPLASTY; Right     Comment:  Procedure: UNICOMPARTMENTAL KNEE;  Surgeon: Corky Mull, MD;  Location: [facility];  Service: Orthopedics;                Laterality: Right; 2012: REPLACEMENT TOTAL KNEE; Left 09/20/2020: TEMPORARY DIALYSIS CATHETER     Comment:  Procedure: TEMPORARY DIALYSIS CATHETER;  Surgeon:               Katha Cabal, MD;  Location: [facility];               Service: Cardiovascular;;  BMI    Body Mass Index: 28.79 kg/m      Reproductive/Obstetrics negative OB ROS                             Anesthesia Physical Anesthesia Plan  ASA: 4  Anesthesia  Plan: General   Post-op Pain Management:    Induction: Intravenous  PONV Risk Score and Plan: Propofol infusion and TIVA  Airway Management Planned: Natural Airway and Nasal Cannula  Additional Equipment:   Intra-op Plan:   Post-operative Plan:   Informed Consent: I have reviewed the patients History and Physical, chart, labs and discussed the procedure including the risks, benefits and alternatives for the proposed anesthesia with the patient or authorized representative who has indicated his/her understanding and acceptance.     Dental Advisory Given  Plan Discussed with: Anesthesiologist, CRNA and Surgeon  Anesthesia Plan Comments: (Patient consented for risks of anesthesia including but not limited to:  - adverse reactions to medications - risk of airway placement if required - damage to eyes, teeth, lips or other oral mucosa - nerve damage due to positioning  - sore throat or hoarseness - Damage to heart, brain, nerves, lungs, other parts of body or loss of life  Patient voiced understanding.)        Anesthesia Quick Evaluation

## 2021-03-23 ENCOUNTER — Encounter: Payer: Self-pay | Admitting: Internal Medicine

## 2021-03-23 DIAGNOSIS — Z992 Dependence on renal dialysis: Secondary | ICD-10-CM | POA: Diagnosis not present

## 2021-03-23 DIAGNOSIS — N186 End stage renal disease: Secondary | ICD-10-CM | POA: Diagnosis not present

## 2021-03-23 LAB — SURGICAL PATHOLOGY

## 2021-03-25 DIAGNOSIS — Z992 Dependence on renal dialysis: Secondary | ICD-10-CM | POA: Diagnosis not present

## 2021-03-25 DIAGNOSIS — N186 End stage renal disease: Secondary | ICD-10-CM | POA: Diagnosis not present

## 2021-03-29 DIAGNOSIS — J029 Acute pharyngitis, unspecified: Secondary | ICD-10-CM | POA: Diagnosis not present

## 2021-03-29 DIAGNOSIS — N186 End stage renal disease: Secondary | ICD-10-CM | POA: Diagnosis not present

## 2021-03-29 DIAGNOSIS — R059 Cough, unspecified: Secondary | ICD-10-CM | POA: Diagnosis not present

## 2021-03-29 DIAGNOSIS — R519 Headache, unspecified: Secondary | ICD-10-CM | POA: Diagnosis not present

## 2021-03-29 DIAGNOSIS — R0602 Shortness of breath: Secondary | ICD-10-CM | POA: Diagnosis not present

## 2021-03-29 DIAGNOSIS — Z992 Dependence on renal dialysis: Secondary | ICD-10-CM | POA: Diagnosis not present

## 2021-03-30 ENCOUNTER — Other Ambulatory Visit: Payer: Medicare HMO

## 2021-03-30 DIAGNOSIS — Z992 Dependence on renal dialysis: Secondary | ICD-10-CM | POA: Diagnosis not present

## 2021-03-30 DIAGNOSIS — N186 End stage renal disease: Secondary | ICD-10-CM | POA: Diagnosis not present

## 2021-04-01 DIAGNOSIS — N186 End stage renal disease: Secondary | ICD-10-CM | POA: Diagnosis not present

## 2021-04-01 DIAGNOSIS — Z992 Dependence on renal dialysis: Secondary | ICD-10-CM | POA: Diagnosis not present

## 2021-04-04 DIAGNOSIS — Z992 Dependence on renal dialysis: Secondary | ICD-10-CM | POA: Diagnosis not present

## 2021-04-04 DIAGNOSIS — N186 End stage renal disease: Secondary | ICD-10-CM | POA: Diagnosis not present

## 2021-04-06 DIAGNOSIS — N186 End stage renal disease: Secondary | ICD-10-CM | POA: Diagnosis not present

## 2021-04-06 DIAGNOSIS — Z992 Dependence on renal dialysis: Secondary | ICD-10-CM | POA: Diagnosis not present

## 2021-04-08 DIAGNOSIS — Z992 Dependence on renal dialysis: Secondary | ICD-10-CM | POA: Diagnosis not present

## 2021-04-08 DIAGNOSIS — N186 End stage renal disease: Secondary | ICD-10-CM | POA: Diagnosis not present

## 2021-04-11 DIAGNOSIS — N186 End stage renal disease: Secondary | ICD-10-CM | POA: Diagnosis not present

## 2021-04-11 DIAGNOSIS — Z992 Dependence on renal dialysis: Secondary | ICD-10-CM | POA: Diagnosis not present

## 2021-04-13 DIAGNOSIS — Z992 Dependence on renal dialysis: Secondary | ICD-10-CM | POA: Diagnosis not present

## 2021-04-13 DIAGNOSIS — N186 End stage renal disease: Secondary | ICD-10-CM | POA: Diagnosis not present

## 2021-04-17 DIAGNOSIS — Z992 Dependence on renal dialysis: Secondary | ICD-10-CM | POA: Diagnosis not present

## 2021-04-17 DIAGNOSIS — N186 End stage renal disease: Secondary | ICD-10-CM | POA: Diagnosis not present

## 2021-04-20 DIAGNOSIS — N186 End stage renal disease: Secondary | ICD-10-CM | POA: Diagnosis not present

## 2021-04-20 DIAGNOSIS — Z992 Dependence on renal dialysis: Secondary | ICD-10-CM | POA: Diagnosis not present

## 2021-04-22 DIAGNOSIS — Z992 Dependence on renal dialysis: Secondary | ICD-10-CM | POA: Diagnosis not present

## 2021-04-22 DIAGNOSIS — N186 End stage renal disease: Secondary | ICD-10-CM | POA: Diagnosis not present

## 2021-04-25 DIAGNOSIS — N186 End stage renal disease: Secondary | ICD-10-CM | POA: Diagnosis not present

## 2021-04-25 DIAGNOSIS — Z992 Dependence on renal dialysis: Secondary | ICD-10-CM | POA: Diagnosis not present

## 2021-04-27 DIAGNOSIS — N186 End stage renal disease: Secondary | ICD-10-CM | POA: Diagnosis not present

## 2021-04-27 DIAGNOSIS — Z992 Dependence on renal dialysis: Secondary | ICD-10-CM | POA: Diagnosis not present

## 2021-04-29 DIAGNOSIS — N186 End stage renal disease: Secondary | ICD-10-CM | POA: Diagnosis not present

## 2021-04-29 DIAGNOSIS — Z992 Dependence on renal dialysis: Secondary | ICD-10-CM | POA: Diagnosis not present

## 2021-05-02 DIAGNOSIS — Z992 Dependence on renal dialysis: Secondary | ICD-10-CM | POA: Diagnosis not present

## 2021-05-02 DIAGNOSIS — N186 End stage renal disease: Secondary | ICD-10-CM | POA: Diagnosis not present

## 2021-05-04 DIAGNOSIS — Z992 Dependence on renal dialysis: Secondary | ICD-10-CM | POA: Diagnosis not present

## 2021-05-04 DIAGNOSIS — N186 End stage renal disease: Secondary | ICD-10-CM | POA: Diagnosis not present

## 2021-05-06 DIAGNOSIS — Z992 Dependence on renal dialysis: Secondary | ICD-10-CM | POA: Diagnosis not present

## 2021-05-06 DIAGNOSIS — N186 End stage renal disease: Secondary | ICD-10-CM | POA: Diagnosis not present

## 2021-05-09 DIAGNOSIS — N186 End stage renal disease: Secondary | ICD-10-CM | POA: Diagnosis not present

## 2021-05-09 DIAGNOSIS — Z992 Dependence on renal dialysis: Secondary | ICD-10-CM | POA: Diagnosis not present

## 2021-05-11 DIAGNOSIS — Z992 Dependence on renal dialysis: Secondary | ICD-10-CM | POA: Diagnosis not present

## 2021-05-11 DIAGNOSIS — N186 End stage renal disease: Secondary | ICD-10-CM | POA: Diagnosis not present

## 2021-05-12 DIAGNOSIS — Z992 Dependence on renal dialysis: Secondary | ICD-10-CM | POA: Diagnosis not present

## 2021-05-12 DIAGNOSIS — N186 End stage renal disease: Secondary | ICD-10-CM | POA: Diagnosis not present

## 2021-05-13 DIAGNOSIS — N186 End stage renal disease: Secondary | ICD-10-CM | POA: Diagnosis not present

## 2021-05-13 DIAGNOSIS — Z992 Dependence on renal dialysis: Secondary | ICD-10-CM | POA: Diagnosis not present

## 2021-05-17 DIAGNOSIS — Z992 Dependence on renal dialysis: Secondary | ICD-10-CM | POA: Diagnosis not present

## 2021-05-17 DIAGNOSIS — N186 End stage renal disease: Secondary | ICD-10-CM | POA: Diagnosis not present

## 2021-05-18 DIAGNOSIS — N186 End stage renal disease: Secondary | ICD-10-CM | POA: Diagnosis not present

## 2021-05-18 DIAGNOSIS — Z992 Dependence on renal dialysis: Secondary | ICD-10-CM | POA: Diagnosis not present

## 2021-05-20 DIAGNOSIS — N186 End stage renal disease: Secondary | ICD-10-CM | POA: Diagnosis not present

## 2021-05-20 DIAGNOSIS — Z992 Dependence on renal dialysis: Secondary | ICD-10-CM | POA: Diagnosis not present

## 2021-05-23 DIAGNOSIS — N186 End stage renal disease: Secondary | ICD-10-CM | POA: Diagnosis not present

## 2021-05-23 DIAGNOSIS — Z992 Dependence on renal dialysis: Secondary | ICD-10-CM | POA: Diagnosis not present

## 2021-05-25 DIAGNOSIS — Z992 Dependence on renal dialysis: Secondary | ICD-10-CM | POA: Diagnosis not present

## 2021-05-25 DIAGNOSIS — N186 End stage renal disease: Secondary | ICD-10-CM | POA: Diagnosis not present

## 2021-05-27 DIAGNOSIS — N186 End stage renal disease: Secondary | ICD-10-CM | POA: Diagnosis not present

## 2021-05-27 DIAGNOSIS — Z992 Dependence on renal dialysis: Secondary | ICD-10-CM | POA: Diagnosis not present

## 2021-05-30 DIAGNOSIS — N186 End stage renal disease: Secondary | ICD-10-CM | POA: Diagnosis not present

## 2021-05-30 DIAGNOSIS — Z992 Dependence on renal dialysis: Secondary | ICD-10-CM | POA: Diagnosis not present

## 2021-06-01 DIAGNOSIS — Z992 Dependence on renal dialysis: Secondary | ICD-10-CM | POA: Diagnosis not present

## 2021-06-01 DIAGNOSIS — N186 End stage renal disease: Secondary | ICD-10-CM | POA: Diagnosis not present

## 2021-06-06 DIAGNOSIS — Z992 Dependence on renal dialysis: Secondary | ICD-10-CM | POA: Diagnosis not present

## 2021-06-06 DIAGNOSIS — N186 End stage renal disease: Secondary | ICD-10-CM | POA: Diagnosis not present

## 2021-06-08 DIAGNOSIS — Z992 Dependence on renal dialysis: Secondary | ICD-10-CM | POA: Diagnosis not present

## 2021-06-08 DIAGNOSIS — N186 End stage renal disease: Secondary | ICD-10-CM | POA: Diagnosis not present

## 2021-06-10 DIAGNOSIS — N186 End stage renal disease: Secondary | ICD-10-CM | POA: Diagnosis not present

## 2021-06-10 DIAGNOSIS — Z992 Dependence on renal dialysis: Secondary | ICD-10-CM | POA: Diagnosis not present

## 2021-06-11 DIAGNOSIS — N186 End stage renal disease: Secondary | ICD-10-CM | POA: Diagnosis not present

## 2021-06-11 DIAGNOSIS — Z992 Dependence on renal dialysis: Secondary | ICD-10-CM | POA: Diagnosis not present

## 2021-06-13 DIAGNOSIS — Z992 Dependence on renal dialysis: Secondary | ICD-10-CM | POA: Diagnosis not present

## 2021-06-13 DIAGNOSIS — N186 End stage renal disease: Secondary | ICD-10-CM | POA: Diagnosis not present

## 2021-06-15 DIAGNOSIS — N186 End stage renal disease: Secondary | ICD-10-CM | POA: Diagnosis not present

## 2021-06-15 DIAGNOSIS — Z992 Dependence on renal dialysis: Secondary | ICD-10-CM | POA: Diagnosis not present

## 2021-06-17 DIAGNOSIS — N186 End stage renal disease: Secondary | ICD-10-CM | POA: Diagnosis not present

## 2021-06-17 DIAGNOSIS — Z992 Dependence on renal dialysis: Secondary | ICD-10-CM | POA: Diagnosis not present

## 2021-06-20 DIAGNOSIS — N186 End stage renal disease: Secondary | ICD-10-CM | POA: Diagnosis not present

## 2021-06-20 DIAGNOSIS — Z992 Dependence on renal dialysis: Secondary | ICD-10-CM | POA: Diagnosis not present

## 2021-06-22 DIAGNOSIS — Z992 Dependence on renal dialysis: Secondary | ICD-10-CM | POA: Diagnosis not present

## 2021-06-22 DIAGNOSIS — N186 End stage renal disease: Secondary | ICD-10-CM | POA: Diagnosis not present

## 2021-06-24 DIAGNOSIS — Z992 Dependence on renal dialysis: Secondary | ICD-10-CM | POA: Diagnosis not present

## 2021-06-24 DIAGNOSIS — N186 End stage renal disease: Secondary | ICD-10-CM | POA: Diagnosis not present

## 2021-06-27 ENCOUNTER — Inpatient Hospital Stay: Payer: Medicare HMO

## 2021-06-27 ENCOUNTER — Inpatient Hospital Stay: Payer: Medicare HMO | Admitting: Oncology

## 2021-06-27 DIAGNOSIS — N186 End stage renal disease: Secondary | ICD-10-CM | POA: Diagnosis not present

## 2021-06-27 DIAGNOSIS — Z992 Dependence on renal dialysis: Secondary | ICD-10-CM | POA: Diagnosis not present

## 2021-06-29 DIAGNOSIS — Z992 Dependence on renal dialysis: Secondary | ICD-10-CM | POA: Diagnosis not present

## 2021-06-29 DIAGNOSIS — N186 End stage renal disease: Secondary | ICD-10-CM | POA: Diagnosis not present

## 2021-06-30 ENCOUNTER — Inpatient Hospital Stay: Payer: Medicare HMO

## 2021-06-30 ENCOUNTER — Inpatient Hospital Stay: Payer: Medicare HMO | Admitting: Nurse Practitioner

## 2021-07-01 DIAGNOSIS — Z992 Dependence on renal dialysis: Secondary | ICD-10-CM | POA: Diagnosis not present

## 2021-07-01 DIAGNOSIS — N186 End stage renal disease: Secondary | ICD-10-CM | POA: Diagnosis not present

## 2021-07-05 DIAGNOSIS — Z992 Dependence on renal dialysis: Secondary | ICD-10-CM | POA: Diagnosis not present

## 2021-07-05 DIAGNOSIS — N186 End stage renal disease: Secondary | ICD-10-CM | POA: Diagnosis not present

## 2021-07-06 DIAGNOSIS — N186 End stage renal disease: Secondary | ICD-10-CM | POA: Diagnosis not present

## 2021-07-06 DIAGNOSIS — Z992 Dependence on renal dialysis: Secondary | ICD-10-CM | POA: Diagnosis not present

## 2021-07-11 DIAGNOSIS — N186 End stage renal disease: Secondary | ICD-10-CM | POA: Diagnosis not present

## 2021-07-11 DIAGNOSIS — Z992 Dependence on renal dialysis: Secondary | ICD-10-CM | POA: Diagnosis not present

## 2021-07-12 DIAGNOSIS — Z992 Dependence on renal dialysis: Secondary | ICD-10-CM | POA: Diagnosis not present

## 2021-07-12 DIAGNOSIS — N186 End stage renal disease: Secondary | ICD-10-CM | POA: Diagnosis not present

## 2021-07-13 DIAGNOSIS — Z992 Dependence on renal dialysis: Secondary | ICD-10-CM | POA: Diagnosis not present

## 2021-07-13 DIAGNOSIS — N186 End stage renal disease: Secondary | ICD-10-CM | POA: Diagnosis not present

## 2021-07-15 DIAGNOSIS — N186 End stage renal disease: Secondary | ICD-10-CM | POA: Diagnosis not present

## 2021-07-15 DIAGNOSIS — Z992 Dependence on renal dialysis: Secondary | ICD-10-CM | POA: Diagnosis not present

## 2021-07-18 DIAGNOSIS — N186 End stage renal disease: Secondary | ICD-10-CM | POA: Diagnosis not present

## 2021-07-18 DIAGNOSIS — Z992 Dependence on renal dialysis: Secondary | ICD-10-CM | POA: Diagnosis not present

## 2021-07-20 DIAGNOSIS — Z992 Dependence on renal dialysis: Secondary | ICD-10-CM | POA: Diagnosis not present

## 2021-07-20 DIAGNOSIS — N186 End stage renal disease: Secondary | ICD-10-CM | POA: Diagnosis not present

## 2021-07-21 ENCOUNTER — Inpatient Hospital Stay: Payer: Medicare HMO | Attending: Oncology

## 2021-07-21 ENCOUNTER — Other Ambulatory Visit: Payer: Self-pay | Admitting: *Deleted

## 2021-07-21 ENCOUNTER — Encounter: Payer: Self-pay | Admitting: Nurse Practitioner

## 2021-07-21 ENCOUNTER — Other Ambulatory Visit: Payer: Self-pay

## 2021-07-21 ENCOUNTER — Inpatient Hospital Stay: Payer: Medicare HMO | Admitting: Nurse Practitioner

## 2021-07-21 VITALS — BP 170/88 | HR 61 | Temp 95.9°F | Resp 16 | Ht 64.5 in | Wt 182.0 lb

## 2021-07-21 DIAGNOSIS — D631 Anemia in chronic kidney disease: Secondary | ICD-10-CM | POA: Diagnosis not present

## 2021-07-21 DIAGNOSIS — Z79899 Other long term (current) drug therapy: Secondary | ICD-10-CM | POA: Diagnosis not present

## 2021-07-21 DIAGNOSIS — E538 Deficiency of other specified B group vitamins: Secondary | ICD-10-CM | POA: Diagnosis not present

## 2021-07-21 DIAGNOSIS — N185 Chronic kidney disease, stage 5: Secondary | ICD-10-CM

## 2021-07-21 DIAGNOSIS — D509 Iron deficiency anemia, unspecified: Secondary | ICD-10-CM

## 2021-07-21 DIAGNOSIS — N186 End stage renal disease: Secondary | ICD-10-CM | POA: Insufficient documentation

## 2021-07-21 DIAGNOSIS — Z992 Dependence on renal dialysis: Secondary | ICD-10-CM | POA: Diagnosis not present

## 2021-07-21 LAB — CBC WITH DIFFERENTIAL/PLATELET
Abs Immature Granulocytes: 0.02 10*3/uL (ref 0.00–0.07)
Basophils Absolute: 0 10*3/uL (ref 0.0–0.1)
Basophils Relative: 0 %
Eosinophils Absolute: 0.2 10*3/uL (ref 0.0–0.5)
Eosinophils Relative: 2 %
HCT: 38.6 % (ref 36.0–46.0)
Hemoglobin: 12.2 g/dL (ref 12.0–15.0)
Immature Granulocytes: 0 %
Lymphocytes Relative: 16 %
Lymphs Abs: 1.2 10*3/uL (ref 0.7–4.0)
MCH: 30.3 pg (ref 26.0–34.0)
MCHC: 31.6 g/dL (ref 30.0–36.0)
MCV: 96 fL (ref 80.0–100.0)
Monocytes Absolute: 0.5 10*3/uL (ref 0.1–1.0)
Monocytes Relative: 6 %
Neutro Abs: 5.6 10*3/uL (ref 1.7–7.7)
Neutrophils Relative %: 76 %
Platelets: 162 10*3/uL (ref 150–400)
RBC: 4.02 MIL/uL (ref 3.87–5.11)
RDW: 15.8 % — ABNORMAL HIGH (ref 11.5–15.5)
WBC: 7.5 10*3/uL (ref 4.0–10.5)
nRBC: 0 % (ref 0.0–0.2)

## 2021-07-21 LAB — IRON AND TIBC
Iron: 77 ug/dL (ref 28–170)
Saturation Ratios: 28 % (ref 10.4–31.8)
TIBC: 280 ug/dL (ref 250–450)
UIBC: 203 ug/dL

## 2021-07-21 LAB — FERRITIN: Ferritin: 373 ng/mL — ABNORMAL HIGH (ref 11–307)

## 2021-07-21 LAB — VITAMIN B12: Vitamin B-12: 444 pg/mL (ref 180–914)

## 2021-07-21 NOTE — Progress Notes (Signed)
Hematology/Oncology Consult Note [facility]  [phone] [phone]  Patient Care Team: Kirk Ruths, MD as PCP - General (Internal Medicine) Sindy Guadeloupe, MD as Consulting Physician (Hematology and Oncology)   Name of the patient: Anna Key  [id number]  November 22, 1950   Date of visit: 07/21/21  Diagnosis-anemia likely multifactorial secondary to iron deficiency and chronic kidney disease  Chief complaint/ Reason for visit-routine follow-up of anemia  Heme/Onc history: patient is a 71 year old female with a past medical history significant for hypertension hyperlipidemia, coronary artery disease and stage V chronic kidney disease. She has been referred to [place] for anemia.On review of her outside labs CBC showed H&H of 7.7/25.7 with an MCV of 85.4 on 04/20/2020.  Her hemoglobin has been gradually drifting down from 12 in July 2020 over the last 2 years.  White count and platelets have been normal.  Patient received multiple doses of IV iron between April and August 2022.  She has been on dialysis now since August 2022.  She has been receiving Venofer through dialysis.  Interval history-overall patient reports feeling well.  She is on hemodialysis 3 times a week.  Reports mild fatigue but denies other complaints at this time  ECOG PS- 1 Pain scale- 0  Review of systems- Review of Systems  Constitutional:  Positive for malaise/fatigue. Negative for chills, fever and weight loss.  HENT:  Negative for congestion, ear discharge and nosebleeds.   Eyes:  Negative for blurred vision.  Respiratory:  Negative for cough, hemoptysis, sputum production, shortness of breath and wheezing.   Cardiovascular:  Negative for chest pain, palpitations, orthopnea and claudication.  Gastrointestinal:  Negative for abdominal pain, blood in stool, constipation, diarrhea, heartburn, melena, nausea and vomiting.  Genitourinary:  Negative for dysuria, flank  pain, frequency, hematuria and urgency.  Musculoskeletal:  Negative for back pain, joint pain and myalgias.  Skin:  Negative for rash.  Neurological:  Negative for dizziness, tingling, focal weakness, seizures, weakness and headaches.  Endo/Heme/Allergies:  Does not bruise/bleed easily.  Psychiatric/Behavioral:  Negative for depression and suicidal ideas. The patient does not have insomnia.     Allergies  Allergen Reactions   Penicillins Hives and Rash    Has patient had a PCN reaction causing immediate rash, facial/tongue/throat swelling, SOB or lightheadedness with hypotension: Yes Has patient had a PCN reaction causing severe rash involving mucus membranes or skin necrosis: Yes--all over body Has patient had a PCN reaction that required hospitalization: No  Has patient had a PCN reaction occurring within the last 10 years: No If all of the above answers are "NO", then may proceed with Cephalosporin use.      Past Medical History:  Diagnosis Date   Anemia    Arthritis    osteoarthritis of right knee   CHF (congestive heart failure) (HCC)    Chronic kidney disease    stage 3   Gout    Hepatitis C antibody test positive    Hyperlipidemia    Hypertension      Past Surgical History:  Procedure Laterality Date   A/V FISTULAGRAM N/A 09/22/2020   Procedure: A/V Fistulagram;  Surgeon: Algernon Huxley, MD;  Location: [place] [facility];  Service: Cardiovascular;  Laterality: N/A;   AV FISTULA PLACEMENT Left 11/27/2019   Procedure: ARTERIOVENOUS (AV) Brachiocephalic FISTULA CREATION, possible graft;  Surgeon: Katha Cabal, MD;  Location: [facility];  Service: Vascular;  Laterality: Left;   CARDIAC CATHETERIZATION     COLONOSCOPY WITH  PROPOFOL N/A 03/22/2021   Procedure: COLONOSCOPY WITH PROPOFOL;  Surgeon: Toledo, Benay Pike, MD;  Location: [facility];  Service: Gastroenterology;  Laterality: N/A;   DIAGNOSTIC LAPAROSCOPY     ESOPHAGOGASTRODUODENOSCOPY N/A 03/22/2021    Procedure: ESOPHAGOGASTRODUODENOSCOPY (EGD);  Surgeon: Toledo, Benay Pike, MD;  Location: [facility];  Service: Gastroenterology;  Laterality: N/A;   LAPAROSCOPIC LYSIS OF ADHESIONS  08/12/2019   Procedure: LAPAROSCOPIC LYSIS OF ADHESIONS;  Surgeon: Jules Husbands, MD;  Location: [facility];  Service: General;;   PARTIAL KNEE ARTHROPLASTY Right 06/11/2017   Procedure: UNICOMPARTMENTAL KNEE;  Surgeon: Corky Mull, MD;  Location: [facility];  Service: Orthopedics;  Laterality: Right;   REPLACEMENT TOTAL KNEE Left 2012   TEMPORARY DIALYSIS CATHETER  09/20/2020   Procedure: TEMPORARY DIALYSIS CATHETER;  Surgeon: Katha Cabal, MD;  Location: [place] [facility];  Service: Cardiovascular;;    Social History   Socioeconomic History   Marital status: Married    Spouse name: Not on file   Number of children: Not on file   Years of education: Not on file   Highest education level: Not on file  Occupational History   Not on file  Tobacco Use   Smoking status: Every Day    Packs/day: 0.50    Types: Cigarettes   Smokeless tobacco: Never  Vaping Use   Vaping Use: Never used  Substance and Sexual Activity   Alcohol use: Never   Drug use: Not Currently    Comment: 30 years ago   Sexual activity: Not on file  Other Topics Concern   Not on file  Social History Narrative   Not on file   Social Determinants of Health   Financial Resource Strain: Not on file  Food Insecurity: Not on file  Transportation Needs: Not on file  Physical Activity: Not on file  Stress: Not on file  Social Connections: Not on file  Intimate Partner Violence: Not on file    Family History  Problem Relation Age of Onset   Colon cancer Mother      Current Outpatient Medications:    allopurinol (ZYLOPRIM) 100 MG tablet, Take 100 mg by mouth every morning. , Disp: , Rfl:    amLODipine (NORVASC) 10 MG tablet, Take 1 tablet (10 mg total) by mouth daily., Disp: 30 tablet, Rfl: 2   aspirin EC 325 MG EC  tablet, Take 1 tablet (325 mg total) by mouth daily., Disp: 30 tablet, Rfl: 0   atorvastatin (LIPITOR) 40 MG tablet, Take 40 mg by mouth every morning. , Disp: , Rfl:    calcitRIOL (ROCALTROL) 0.25 MCG capsule, Take 0.25 mcg by mouth daily., Disp: , Rfl:    Calcium 500-100 MG-UNIT CHEW, Chew 500 mg by mouth., Disp: , Rfl:    isosorbide mononitrate (IMDUR) 60 MG 24 hr tablet, Take 1 tablet (60 mg total) by mouth every morning., Disp: 30 tablet, Rfl: 2   losartan (COZAAR) 25 MG tablet, Take 1 tablet by mouth at bedtime., Disp: , Rfl:    metoprolol (TOPROL-XL) 200 MG 24 hr tablet, Take 1 tablet (200 mg total) by mouth every morning., Disp: 30 tablet, Rfl: 2   polyethylene glycol-electrolytes (NULYTELY) 420 g solution, Take by mouth., Disp: , Rfl:    losartan (COZAAR) 25 MG tablet, Take 1 tablet (25 mg total) by mouth at bedtime., Disp: 30 tablet, Rfl: 2   torsemide 40 MG TABS, Take 40 mg by mouth daily. (Patient not taking: Reported on 07/21/2021), Disp: 30 tablet, Rfl: 2  Physical exam:  Vitals:   07/21/21 0925  BP: (!) 170/88  Pulse: 61  Resp: 16  Temp: (!) 95.9 F (35.5 C)  TempSrc: Tympanic  SpO2: 98%  Weight: 182 lb (82.6 kg)  Height: 5' 4.5" (1.638 m)   Physical Exam Constitutional:      General: She is not in acute distress. Cardiovascular:     Rate and Rhythm: Normal rate and regular rhythm.     Heart sounds: Normal heart sounds.  Pulmonary:     Effort: Pulmonary effort is normal.     Breath sounds: Normal breath sounds.  Abdominal:     General: Bowel sounds are normal.     Palpations: Abdomen is soft.  Skin:    General: Skin is warm and dry.  Neurological:     Mental Status: She is alert and oriented to person, place, and time.         Latest Ref Rng & Units 03/22/2021    9:37 AM  CMP  Glucose 70 - 99 mg/dL 89   BUN 8 - 23 mg/dL 74   Creatinine 0.44 - 1.00 mg/dL 6.30   Sodium 135 - 145 mmol/L 143   Potassium 3.5 - 5.1 mmol/L 4.6   Chloride 98 - 111 mmol/L 110        Latest Ref Rng & Units 07/21/2021    9:04 AM  CBC  WBC 4.0 - 10.5 K/uL 7.5   Hemoglobin 12.0 - 15.0 g/dL 12.2   Hematocrit 36.0 - 46.0 % 38.6   Platelets 150 - 400 K/uL 162      Assessment and plan- Patient is a 71 y.o. female with chronic kidney disease on dialysis referred for anemia  Anemia- secondary to CKD, stage 5. On hemodialysis 3 times a week. Hemoglobin has improved to 12.2. Iron stores are well repleted and no role for IV iron at this time. She receives EPO through dialysis and iron. We will continue to follow her conservatively and if she has a worsening drop in her counts, can see her back sooner. Discussed once a year follow up but patient prefers every 6 months.  B12 Deficiency- B12 is 444. Stable. Could optimize in setting   Disposition:  6 mo- lab (cbc, cmp, ferritin, iron studies, b12) Day to week later see Dr. Janese Banks for follow up- la  Visit Diagnosis 1. Anemia of chronic kidney failure, stage 5 ([place])   2. Iron deficiency anemia, unspecified iron deficiency anemia type    Beckey Rutter, DNP, AGNP-C [place] at [facility] [phone] (clinic) 07/21/2021

## 2021-07-22 DIAGNOSIS — Z992 Dependence on renal dialysis: Secondary | ICD-10-CM | POA: Diagnosis not present

## 2021-07-22 DIAGNOSIS — N186 End stage renal disease: Secondary | ICD-10-CM | POA: Diagnosis not present

## 2021-07-24 DIAGNOSIS — Z0000 Encounter for general adult medical examination without abnormal findings: Secondary | ICD-10-CM | POA: Diagnosis not present

## 2021-07-24 DIAGNOSIS — Z1389 Encounter for screening for other disorder: Secondary | ICD-10-CM | POA: Diagnosis not present

## 2021-07-24 DIAGNOSIS — N185 Chronic kidney disease, stage 5: Secondary | ICD-10-CM | POA: Diagnosis not present

## 2021-07-24 DIAGNOSIS — I25119 Atherosclerotic heart disease of native coronary artery with unspecified angina pectoris: Secondary | ICD-10-CM | POA: Diagnosis not present

## 2021-07-25 ENCOUNTER — Other Ambulatory Visit: Payer: Self-pay | Admitting: Internal Medicine

## 2021-07-25 DIAGNOSIS — N186 End stage renal disease: Secondary | ICD-10-CM | POA: Diagnosis not present

## 2021-07-25 DIAGNOSIS — Z992 Dependence on renal dialysis: Secondary | ICD-10-CM | POA: Diagnosis not present

## 2021-07-25 DIAGNOSIS — Z1231 Encounter for screening mammogram for malignant neoplasm of breast: Secondary | ICD-10-CM

## 2021-07-27 DIAGNOSIS — N186 End stage renal disease: Secondary | ICD-10-CM | POA: Diagnosis not present

## 2021-07-27 DIAGNOSIS — Z992 Dependence on renal dialysis: Secondary | ICD-10-CM | POA: Diagnosis not present

## 2021-07-29 DIAGNOSIS — N186 End stage renal disease: Secondary | ICD-10-CM | POA: Diagnosis not present

## 2021-07-29 DIAGNOSIS — Z992 Dependence on renal dialysis: Secondary | ICD-10-CM | POA: Diagnosis not present

## 2021-08-01 DIAGNOSIS — N186 End stage renal disease: Secondary | ICD-10-CM | POA: Diagnosis not present

## 2021-08-01 DIAGNOSIS — Z992 Dependence on renal dialysis: Secondary | ICD-10-CM | POA: Diagnosis not present

## 2021-08-03 DIAGNOSIS — Z992 Dependence on renal dialysis: Secondary | ICD-10-CM | POA: Diagnosis not present

## 2021-08-03 DIAGNOSIS — N186 End stage renal disease: Secondary | ICD-10-CM | POA: Diagnosis not present

## 2021-08-05 DIAGNOSIS — N186 End stage renal disease: Secondary | ICD-10-CM | POA: Diagnosis not present

## 2021-08-05 DIAGNOSIS — Z992 Dependence on renal dialysis: Secondary | ICD-10-CM | POA: Diagnosis not present

## 2021-08-08 DIAGNOSIS — Z992 Dependence on renal dialysis: Secondary | ICD-10-CM | POA: Diagnosis not present

## 2021-08-08 DIAGNOSIS — N186 End stage renal disease: Secondary | ICD-10-CM | POA: Diagnosis not present

## 2021-08-08 DIAGNOSIS — N2581 Secondary hyperparathyroidism of renal origin: Secondary | ICD-10-CM | POA: Diagnosis not present

## 2021-08-08 DIAGNOSIS — D8481 Immunodeficiency due to conditions classified elsewhere: Secondary | ICD-10-CM | POA: Diagnosis not present

## 2021-08-08 DIAGNOSIS — I509 Heart failure, unspecified: Secondary | ICD-10-CM | POA: Diagnosis not present

## 2021-08-08 DIAGNOSIS — I132 Hypertensive heart and chronic kidney disease with heart failure and with stage 5 chronic kidney disease, or end stage renal disease: Secondary | ICD-10-CM | POA: Diagnosis not present

## 2021-08-10 DIAGNOSIS — N186 End stage renal disease: Secondary | ICD-10-CM | POA: Diagnosis not present

## 2021-08-10 DIAGNOSIS — Z992 Dependence on renal dialysis: Secondary | ICD-10-CM | POA: Diagnosis not present

## 2021-08-11 DIAGNOSIS — Z992 Dependence on renal dialysis: Secondary | ICD-10-CM | POA: Diagnosis not present

## 2021-08-11 DIAGNOSIS — N186 End stage renal disease: Secondary | ICD-10-CM | POA: Diagnosis not present

## 2021-08-12 DIAGNOSIS — Z992 Dependence on renal dialysis: Secondary | ICD-10-CM | POA: Diagnosis not present

## 2021-08-12 DIAGNOSIS — N186 End stage renal disease: Secondary | ICD-10-CM | POA: Diagnosis not present

## 2021-08-15 DIAGNOSIS — Z992 Dependence on renal dialysis: Secondary | ICD-10-CM | POA: Diagnosis not present

## 2021-08-15 DIAGNOSIS — N186 End stage renal disease: Secondary | ICD-10-CM | POA: Diagnosis not present

## 2021-08-17 DIAGNOSIS — N186 End stage renal disease: Secondary | ICD-10-CM | POA: Diagnosis not present

## 2021-08-17 DIAGNOSIS — Z992 Dependence on renal dialysis: Secondary | ICD-10-CM | POA: Diagnosis not present

## 2021-08-19 DIAGNOSIS — N186 End stage renal disease: Secondary | ICD-10-CM | POA: Diagnosis not present

## 2021-08-19 DIAGNOSIS — Z992 Dependence on renal dialysis: Secondary | ICD-10-CM | POA: Diagnosis not present

## 2021-08-22 DIAGNOSIS — N186 End stage renal disease: Secondary | ICD-10-CM | POA: Diagnosis not present

## 2021-08-22 DIAGNOSIS — Z992 Dependence on renal dialysis: Secondary | ICD-10-CM | POA: Diagnosis not present

## 2021-08-24 DIAGNOSIS — N186 End stage renal disease: Secondary | ICD-10-CM | POA: Diagnosis not present

## 2021-08-24 DIAGNOSIS — Z992 Dependence on renal dialysis: Secondary | ICD-10-CM | POA: Diagnosis not present

## 2021-08-26 DIAGNOSIS — N186 End stage renal disease: Secondary | ICD-10-CM | POA: Diagnosis not present

## 2021-08-26 DIAGNOSIS — Z992 Dependence on renal dialysis: Secondary | ICD-10-CM | POA: Diagnosis not present

## 2021-08-29 DIAGNOSIS — N186 End stage renal disease: Secondary | ICD-10-CM | POA: Diagnosis not present

## 2021-08-29 DIAGNOSIS — Z992 Dependence on renal dialysis: Secondary | ICD-10-CM | POA: Diagnosis not present

## 2021-08-31 DIAGNOSIS — Z992 Dependence on renal dialysis: Secondary | ICD-10-CM | POA: Diagnosis not present

## 2021-08-31 DIAGNOSIS — N186 End stage renal disease: Secondary | ICD-10-CM | POA: Diagnosis not present

## 2021-09-02 DIAGNOSIS — N186 End stage renal disease: Secondary | ICD-10-CM | POA: Diagnosis not present

## 2021-09-02 DIAGNOSIS — Z992 Dependence on renal dialysis: Secondary | ICD-10-CM | POA: Diagnosis not present

## 2021-09-05 DIAGNOSIS — N186 End stage renal disease: Secondary | ICD-10-CM | POA: Diagnosis not present

## 2021-09-05 DIAGNOSIS — Z992 Dependence on renal dialysis: Secondary | ICD-10-CM | POA: Diagnosis not present

## 2021-09-06 ENCOUNTER — Telehealth: Payer: Self-pay | Admitting: Oncology

## 2021-09-06 NOTE — Telephone Encounter (Signed)
Can I see her after I get back from vacation?

## 2021-09-06 NOTE — Telephone Encounter (Signed)
Patient called and asked for a sooner appointment with Dr. Janese Banks for Insurance purpose- needs to be seen ASAP-   Patient needs Mon Wed or Fri due to dialysis.  Please advise

## 2021-09-09 DIAGNOSIS — Z992 Dependence on renal dialysis: Secondary | ICD-10-CM | POA: Diagnosis not present

## 2021-09-09 DIAGNOSIS — N186 End stage renal disease: Secondary | ICD-10-CM | POA: Diagnosis not present

## 2021-09-11 DIAGNOSIS — N186 End stage renal disease: Secondary | ICD-10-CM | POA: Diagnosis not present

## 2021-09-11 DIAGNOSIS — Z992 Dependence on renal dialysis: Secondary | ICD-10-CM | POA: Diagnosis not present

## 2021-09-12 DIAGNOSIS — Z992 Dependence on renal dialysis: Secondary | ICD-10-CM | POA: Diagnosis not present

## 2021-09-12 DIAGNOSIS — N186 End stage renal disease: Secondary | ICD-10-CM | POA: Diagnosis not present

## 2021-09-13 DIAGNOSIS — Z992 Dependence on renal dialysis: Secondary | ICD-10-CM | POA: Diagnosis not present

## 2021-09-13 DIAGNOSIS — N186 End stage renal disease: Secondary | ICD-10-CM | POA: Diagnosis not present

## 2021-09-15 ENCOUNTER — Telehealth (INDEPENDENT_AMBULATORY_CARE_PROVIDER_SITE_OTHER): Payer: Self-pay | Admitting: Vascular Surgery

## 2021-09-15 ENCOUNTER — Other Ambulatory Visit: Payer: Self-pay

## 2021-09-15 NOTE — Telephone Encounter (Signed)
ATC pt in response to her VM left on my machine 8.3.23. No VM picked up to leave a message. Pt did not state what she needed in message LOM.

## 2021-09-15 NOTE — Patient Outreach (Signed)
[place] [facility]) Care Management  09/15/2021  JAYLYNE BREESE 1950/05/05 [id number]   Telephone Screen    Outreach call to patient to introduce [facility] services and assess care needs as part of benefit of PCP office and insurance plan. No answer after multiple rings and unable to leave message.     Plan: RN CM will make outreach attempt to patient within 4 business days. RN CM will send unsuccessful outreach letter to patient.   Enzo Montgomery, RN,BSN,CCM [place] Management Telephonic Care Management Coordinator Direct Phone: [phone] Toll Free: [phone] Fax: [phone]

## 2021-09-16 DIAGNOSIS — Z992 Dependence on renal dialysis: Secondary | ICD-10-CM | POA: Diagnosis not present

## 2021-09-16 DIAGNOSIS — N186 End stage renal disease: Secondary | ICD-10-CM | POA: Diagnosis not present

## 2021-09-18 ENCOUNTER — Encounter (INDEPENDENT_AMBULATORY_CARE_PROVIDER_SITE_OTHER): Payer: Medicare HMO

## 2021-09-18 ENCOUNTER — Ambulatory Visit (INDEPENDENT_AMBULATORY_CARE_PROVIDER_SITE_OTHER): Payer: Medicare HMO | Admitting: Vascular Surgery

## 2021-09-19 ENCOUNTER — Other Ambulatory Visit: Payer: Self-pay

## 2021-09-19 NOTE — Patient Outreach (Signed)
[place] [facility]) Care Management  09/19/2021  Anna Key 1950/06/07 [id number]     Telephone Screen       Outreach call to patient to introduce [facility] services and assess care needs as part of benefit of PCP office and insurance plan.Spoke with patient.   Main healthcare issue/concern today: Patient states she woke up with a bad headache this morning. As a result, she did not make it to HD treatment. She took some Tylenol and sxs resolved. Patient reports she does not normally miss HD txs.   Health Maintenance/Care Gaps:  -Last AWV: 06/29/20. Pt educated on importance and encouraged to make an appt. She voiced understanding.   Conditions: Per chart review, patient has PMH that includes but not limited to ESRD(T,T,S-9:30am), anemia, CAD, Hep C, OA, HTN, HLD and gout. Patient knowledgeable regarding medical conditions and meds.  Medications Reviewed Today     Reviewed by Hayden Pedro, RN (Registered Nurse) on 09/19/21 at 1308  Med List Status: <None>   Medication Order Taking? Sig Documenting Provider Last Dose Status Informant  allopurinol (ZYLOPRIM) 100 MG tablet [id number] No Take 100 mg by mouth every morning.  [provider] Taking Active Self           Med Note Brunilda Payor   Sun Sep 18, 2020  2:01 PM) LF 09-12-20 90day  amLODipine (NORVASC) 10 MG tablet [id number] No Take 1 tablet (10 mg total) by mouth daily. Terrilee Croak, MD Taking Expired 07/21/21 2359   aspirin EC 325 MG EC tablet [id number] No Take 1 tablet (325 mg total) by mouth daily. Terrilee Croak, MD Taking Active   atorvastatin (LIPITOR) 40 MG tablet [id number] No Take 40 mg by mouth every morning.  [provider] Taking Active Self           Med Note Brunilda Payor   Sun Sep 18, 2020  2:02 PM) LF 07-01-20 90day  calcitRIOL (ROCALTROL) 0.25 MCG capsule [id number] No Take 0.25 mcg by mouth daily. [provider] Taking Active            Med Note Brunilda Payor   Sun Sep 18, 2020  2:05 PM) LF 08-23-20 90day  Calcium 500-100 MG-UNIT CHEW [id number] No Chew 500 mg by mouth. [provider] Taking Active   isosorbide mononitrate (IMDUR) 60 MG 24 hr tablet [id number] No Take 1 tablet (60 mg total) by mouth every morning. Terrilee Croak, MD Taking Expired 07/21/21 2359   losartan (COZAAR) 25 MG tablet [id number]  Take 1 tablet (25 mg total) by mouth at bedtime. Terrilee Croak, MD  Expired 12/23/20 2359   losartan (COZAAR) 25 MG tablet [id number] No Take 1 tablet by mouth at bedtime. [provider] Taking Active   metoprolol (TOPROL-XL) 200 MG 24 hr tablet [id number] No Take 1 tablet (200 mg total) by mouth every morning. Terrilee Croak, MD Taking Expired 07/21/21 2359   polyethylene glycol-electrolytes (NULYTELY) 420 g solution [id number] No Take by mouth. [provider] Taking Active            Med Note Brunilda Payor   Sun Sep 18, 2020  2:05 PM) Filled 09-08-20  torsemide 40 MG TABS [id number] No Take 40 mg by mouth daily.  Patient not taking: Reported on 07/21/2021   Terrilee Croak, MD Not Taking Expired 12/28/20 2359                09/19/2021    1:18 PM  Depression screen  PHQ 2/9  Decreased Interest 0  Down, Depressed, Hopeless 0  PHQ - 2 Score 0    SDOH Screenings   Alcohol Screen: Not on file  Depression (PHQ2-9): Low Risk  (09/19/2021)   Depression (PHQ2-9)    PHQ-2 Score: 0  Financial Resource Strain: Not on file  Food Insecurity: No Food Insecurity (09/19/2021)   Hunger Vital Sign    Worried About Running Out of Food in the Last Year: Never true    Ran Out of Food in the Last Year: Never true  Housing: Not on file  Physical Activity: Not on file  Social Connections: Not on file  Stress: Not on file  Tobacco Use: High Risk (09/19/2021)   Patient History    Smoking Tobacco Use: Every Day    Smokeless Tobacco Use: Never    Passive Exposure: Not on file  Transportation Needs: No Transportation Needs (09/19/2021)    PRAPARE - Transportation    Lack of Transportation (Medical): No    Lack of Transportation (Non-Medical): No       09/23/2020    9:39 PM 09/24/2020    9:48 AM 03/22/2021    9:30 AM 07/21/2021    9:47 AM 09/19/2021    1:22 PM  [place] in the past year?     0  Was there an injury with Fall?     0  Fall Risk Category Calculator     0  Fall Risk Category     Low  Patient Fall Risk Level High fall risk High fall risk Low fall risk Low fall risk Low fall risk  Patient at Risk for Falls Due to     Medication side effect  Fall risk Follow up     Falls evaluation completed;Education provided    Care Plan : RN Care Manager POC  Updates made by Hayden Pedro, RN since 09/19/2021 12:00 AM     Problem: Chronic Disease Mgmt of Chronic Condition-ESRD   Priority: High     Long-Range Goal: Development of POC for Mgmt of Chronic Condition-ESRD   Start Date: 09/19/2021  Expected End Date: 09/20/2022  Priority: High  Note:     Current Barriers:  Chronic Disease Management support and education needs related to ESRD   RNCM Clinical Goal(s):  Patient will verbalize understanding of plan for management of ESRD as evidenced by mgmt of chronic conditions continue to work with RN Care Manager to address care management and care coordination needs related to  ESRD as evidenced by adherence to CM Team Scheduled appointments through collaboration with RN Care manager, provider, and care team.   Interventions:  Inter-disciplinary care team collaboration (see longitudinal plan of care) Evaluation of current treatment plan related to  self management and patient's adherence to plan as established by provider    Chronic Kidney Disease Interventions:  (Status:  New goal.) Long Term Goal Assessed the Patient understanding of chronic kidney disease    Evaluation of current treatment plan related to chronic kidney disease self management and patient's adherence to plan as established by  provider      Reviewed prescribed diet -renal Reviewed medications with patient and discussed importance of compliance    Discussed plans with patient for ongoing care management follow up and provided patient with direct contact information for care management team    Last practice recorded BP readings:  BP Readings from Last 3 Encounters:  07/21/21 (!) 170/88  03/22/21 (!) 165/77  12/28/20 111/69  Most  recent eGFR/CrCl: No results found for: "EGFR"  No components found for: "CRCL"  Patient Goals/Self-Care Activities: Take all medications as prescribed Attend all scheduled provider appointments Call provider office for new concerns or questions  Adhere to HD schedule  Follow Up Plan:  Telephone follow up appointment with care management team member scheduled for:  within the month of Nov The patient has been provided with contact information for the care management team and has been advised to call with any health related questions or concerns.       Consent: [facility] services reviewed and discussed with patient. Verbal consent for services given.  Plan: RN CM discussed with patient next outreach within the month of Nov. Patient agrees to care plan and follow up. RN CM will send barriers letter and route encounter to PCP. RN CM will send welcome letter to patient.  Enzo Montgomery, RN,BSN,CCM [place] Management Telephonic Care Management Coordinator Direct Phone: [phone] Toll Free: [phone] Fax: [phone]

## 2021-09-21 ENCOUNTER — Encounter: Payer: Self-pay | Admitting: Oncology

## 2021-09-21 DIAGNOSIS — N186 End stage renal disease: Secondary | ICD-10-CM | POA: Diagnosis not present

## 2021-09-21 DIAGNOSIS — Z992 Dependence on renal dialysis: Secondary | ICD-10-CM | POA: Diagnosis not present

## 2021-09-22 ENCOUNTER — Inpatient Hospital Stay: Payer: Medicare HMO | Admitting: Nurse Practitioner

## 2021-09-23 DIAGNOSIS — Z992 Dependence on renal dialysis: Secondary | ICD-10-CM | POA: Diagnosis not present

## 2021-09-23 DIAGNOSIS — N186 End stage renal disease: Secondary | ICD-10-CM | POA: Diagnosis not present

## 2021-09-26 DIAGNOSIS — N186 End stage renal disease: Secondary | ICD-10-CM | POA: Diagnosis not present

## 2021-09-26 DIAGNOSIS — Z992 Dependence on renal dialysis: Secondary | ICD-10-CM | POA: Diagnosis not present

## 2021-09-28 DIAGNOSIS — Z992 Dependence on renal dialysis: Secondary | ICD-10-CM | POA: Diagnosis not present

## 2021-09-28 DIAGNOSIS — N186 End stage renal disease: Secondary | ICD-10-CM | POA: Diagnosis not present

## 2021-09-29 ENCOUNTER — Other Ambulatory Visit (INDEPENDENT_AMBULATORY_CARE_PROVIDER_SITE_OTHER): Payer: Self-pay | Admitting: Vascular Surgery

## 2021-09-29 DIAGNOSIS — N185 Chronic kidney disease, stage 5: Secondary | ICD-10-CM

## 2021-09-30 DIAGNOSIS — Z992 Dependence on renal dialysis: Secondary | ICD-10-CM | POA: Diagnosis not present

## 2021-09-30 DIAGNOSIS — N186 End stage renal disease: Secondary | ICD-10-CM | POA: Diagnosis not present

## 2021-10-02 ENCOUNTER — Encounter (INDEPENDENT_AMBULATORY_CARE_PROVIDER_SITE_OTHER): Payer: Medicare HMO

## 2021-10-03 DIAGNOSIS — Z992 Dependence on renal dialysis: Secondary | ICD-10-CM | POA: Diagnosis not present

## 2021-10-03 DIAGNOSIS — N186 End stage renal disease: Secondary | ICD-10-CM | POA: Diagnosis not present

## 2021-10-05 DIAGNOSIS — Z992 Dependence on renal dialysis: Secondary | ICD-10-CM | POA: Diagnosis not present

## 2021-10-05 DIAGNOSIS — N186 End stage renal disease: Secondary | ICD-10-CM | POA: Diagnosis not present

## 2021-10-07 DIAGNOSIS — N186 End stage renal disease: Secondary | ICD-10-CM | POA: Diagnosis not present

## 2021-10-07 DIAGNOSIS — Z992 Dependence on renal dialysis: Secondary | ICD-10-CM | POA: Diagnosis not present

## 2021-10-10 DIAGNOSIS — N186 End stage renal disease: Secondary | ICD-10-CM | POA: Diagnosis not present

## 2021-10-10 DIAGNOSIS — Z992 Dependence on renal dialysis: Secondary | ICD-10-CM | POA: Diagnosis not present

## 2021-10-12 ENCOUNTER — Other Ambulatory Visit: Payer: Self-pay

## 2021-10-12 DIAGNOSIS — N186 End stage renal disease: Secondary | ICD-10-CM | POA: Diagnosis not present

## 2021-10-12 DIAGNOSIS — Z992 Dependence on renal dialysis: Secondary | ICD-10-CM | POA: Diagnosis not present

## 2021-10-12 NOTE — Patient Outreach (Signed)
[place] [facility]) Care Management  10/12/2021  TAQUITA DEMBY 27-Sep-1950 [id number]   Case Transfer  Pt will be followed for care coordination by [place] Management. Assigned RN CM will outreach to patient.   Enzo Montgomery, RN,BSN,CCM [place] Management Telephonic Care Management Coordinator Direct Phone: [phone] Toll Free: [phone] Fax: [phone]

## 2021-10-14 DIAGNOSIS — Z992 Dependence on renal dialysis: Secondary | ICD-10-CM | POA: Diagnosis not present

## 2021-10-14 DIAGNOSIS — N186 End stage renal disease: Secondary | ICD-10-CM | POA: Diagnosis not present

## 2021-10-17 ENCOUNTER — Telehealth (INDEPENDENT_AMBULATORY_CARE_PROVIDER_SITE_OTHER): Payer: Self-pay

## 2021-10-17 DIAGNOSIS — Z992 Dependence on renal dialysis: Secondary | ICD-10-CM | POA: Diagnosis not present

## 2021-10-17 DIAGNOSIS — N186 End stage renal disease: Secondary | ICD-10-CM | POA: Diagnosis not present

## 2021-10-17 NOTE — Telephone Encounter (Signed)
Pt LVM she needs to cancel and reschedule appt. Please return call at [phone]

## 2021-10-18 ENCOUNTER — Encounter (INDEPENDENT_AMBULATORY_CARE_PROVIDER_SITE_OTHER): Payer: Medicare HMO

## 2021-10-19 ENCOUNTER — Ambulatory Visit (INDEPENDENT_AMBULATORY_CARE_PROVIDER_SITE_OTHER): Payer: Medicare HMO | Admitting: Vascular Surgery

## 2021-10-19 DIAGNOSIS — Z992 Dependence on renal dialysis: Secondary | ICD-10-CM | POA: Diagnosis not present

## 2021-10-19 DIAGNOSIS — N186 End stage renal disease: Secondary | ICD-10-CM | POA: Diagnosis not present

## 2021-10-21 DIAGNOSIS — N186 End stage renal disease: Secondary | ICD-10-CM | POA: Diagnosis not present

## 2021-10-21 DIAGNOSIS — Z992 Dependence on renal dialysis: Secondary | ICD-10-CM | POA: Diagnosis not present

## 2021-10-24 DIAGNOSIS — Z992 Dependence on renal dialysis: Secondary | ICD-10-CM | POA: Diagnosis not present

## 2021-10-24 DIAGNOSIS — N186 End stage renal disease: Secondary | ICD-10-CM | POA: Diagnosis not present

## 2021-10-26 DIAGNOSIS — Z992 Dependence on renal dialysis: Secondary | ICD-10-CM | POA: Diagnosis not present

## 2021-10-26 DIAGNOSIS — N186 End stage renal disease: Secondary | ICD-10-CM | POA: Diagnosis not present

## 2021-10-28 DIAGNOSIS — N186 End stage renal disease: Secondary | ICD-10-CM | POA: Diagnosis not present

## 2021-10-28 DIAGNOSIS — Z992 Dependence on renal dialysis: Secondary | ICD-10-CM | POA: Diagnosis not present

## 2021-10-31 DIAGNOSIS — Z992 Dependence on renal dialysis: Secondary | ICD-10-CM | POA: Diagnosis not present

## 2021-10-31 DIAGNOSIS — N186 End stage renal disease: Secondary | ICD-10-CM | POA: Diagnosis not present

## 2021-11-02 DIAGNOSIS — Z992 Dependence on renal dialysis: Secondary | ICD-10-CM | POA: Diagnosis not present

## 2021-11-02 DIAGNOSIS — N186 End stage renal disease: Secondary | ICD-10-CM | POA: Diagnosis not present

## 2021-11-06 ENCOUNTER — Ambulatory Visit: Payer: Medicare HMO | Admitting: Oncology

## 2021-11-06 ENCOUNTER — Other Ambulatory Visit: Payer: Medicare HMO

## 2021-11-06 DIAGNOSIS — N186 End stage renal disease: Secondary | ICD-10-CM | POA: Diagnosis not present

## 2021-11-06 DIAGNOSIS — Z992 Dependence on renal dialysis: Secondary | ICD-10-CM | POA: Diagnosis not present

## 2021-11-09 DIAGNOSIS — N186 End stage renal disease: Secondary | ICD-10-CM | POA: Diagnosis not present

## 2021-11-09 DIAGNOSIS — Z992 Dependence on renal dialysis: Secondary | ICD-10-CM | POA: Diagnosis not present

## 2021-11-11 DIAGNOSIS — Z992 Dependence on renal dialysis: Secondary | ICD-10-CM | POA: Diagnosis not present

## 2021-11-11 DIAGNOSIS — N186 End stage renal disease: Secondary | ICD-10-CM | POA: Diagnosis not present

## 2021-11-15 DIAGNOSIS — N186 End stage renal disease: Secondary | ICD-10-CM | POA: Diagnosis not present

## 2021-11-15 DIAGNOSIS — Z992 Dependence on renal dialysis: Secondary | ICD-10-CM | POA: Diagnosis not present

## 2021-11-16 DIAGNOSIS — Z992 Dependence on renal dialysis: Secondary | ICD-10-CM | POA: Diagnosis not present

## 2021-11-16 DIAGNOSIS — N186 End stage renal disease: Secondary | ICD-10-CM | POA: Diagnosis not present

## 2021-11-18 DIAGNOSIS — Z992 Dependence on renal dialysis: Secondary | ICD-10-CM | POA: Diagnosis not present

## 2021-11-18 DIAGNOSIS — N186 End stage renal disease: Secondary | ICD-10-CM | POA: Diagnosis not present

## 2021-11-20 ENCOUNTER — Encounter: Payer: Self-pay | Admitting: Oncology

## 2021-11-20 ENCOUNTER — Inpatient Hospital Stay: Payer: Medicare HMO

## 2021-11-20 ENCOUNTER — Inpatient Hospital Stay: Payer: Medicare HMO | Attending: Oncology | Admitting: Oncology

## 2021-11-20 VITALS — BP 147/85 | HR 64 | Temp 97.6°F | Wt 180.0 lb

## 2021-11-20 DIAGNOSIS — Z79899 Other long term (current) drug therapy: Secondary | ICD-10-CM | POA: Diagnosis not present

## 2021-11-20 DIAGNOSIS — F17210 Nicotine dependence, cigarettes, uncomplicated: Secondary | ICD-10-CM | POA: Diagnosis not present

## 2021-11-20 DIAGNOSIS — N186 End stage renal disease: Secondary | ICD-10-CM | POA: Diagnosis not present

## 2021-11-20 DIAGNOSIS — Z992 Dependence on renal dialysis: Secondary | ICD-10-CM | POA: Insufficient documentation

## 2021-11-20 DIAGNOSIS — D631 Anemia in chronic kidney disease: Secondary | ICD-10-CM | POA: Diagnosis not present

## 2021-11-20 DIAGNOSIS — D509 Iron deficiency anemia, unspecified: Secondary | ICD-10-CM | POA: Diagnosis not present

## 2021-11-20 LAB — CBC
HCT: 38.2 % (ref 36.0–46.0)
Hemoglobin: 12 g/dL (ref 12.0–15.0)
MCH: 30 pg (ref 26.0–34.0)
MCHC: 31.4 g/dL (ref 30.0–36.0)
MCV: 95.5 fL (ref 80.0–100.0)
Platelets: 156 10*3/uL (ref 150–400)
RBC: 4 MIL/uL (ref 3.87–5.11)
RDW: 15.8 % — ABNORMAL HIGH (ref 11.5–15.5)
WBC: 7.3 10*3/uL (ref 4.0–10.5)
nRBC: 0 % (ref 0.0–0.2)

## 2021-11-20 LAB — IRON AND TIBC
Iron: 87 ug/dL (ref 28–170)
Saturation Ratios: 30 % (ref 10.4–31.8)
TIBC: 291 ug/dL (ref 250–450)
UIBC: 204 ug/dL

## 2021-11-20 LAB — FERRITIN: Ferritin: 437 ng/mL — ABNORMAL HIGH (ref 11–307)

## 2021-11-20 NOTE — Progress Notes (Signed)
Hematology/Oncology Consult note [facility]  Telephone:([phone] [phone]  Patient Care Team: Kirk Ruths, MD as PCP - General (Internal Medicine) Sindy Guadeloupe, MD as Consulting Physician (Hematology and Oncology)   Name of the patient: Anna Key  [id number]  14-Feb-1950   Date of visit: 11/20/21  Diagnosis- anemia likely multifactorial secondary to iron deficiency and chronic kidney disease  Chief complaint/ Reason for visit-routine follow-up of anemia  Heme/Onc history: patient is a 71 year old female with a past medical history significant for hypertension hyperlipidemia, coronary artery disease and stage V chronic kidney disease. She has been referred to [place] for anemia.On review of her outside labs CBC showed H&H of 7.7/25.7 with an MCV of 85.4 on 04/20/2020.  Her hemoglobin has been gradually drifting down from 12 in July 2020 over the last 2 years.  White count and platelets have been normal.  Patient received multiple doses of IV iron between April and August 2022.  She has been on dialysis now since August 2022.  She has been receiving Venofer through dialysis.    Interval history-patient is doing well and denies any specific complaints at this time.  She continues to go for hemodialysis 3 times a week  ECOG PS- 1 Pain scale- 0   Review of systems- Review of Systems  Constitutional:  Positive for malaise/fatigue. Negative for chills, fever and weight loss.  HENT:  Negative for congestion, ear discharge and nosebleeds.   Eyes:  Negative for blurred vision.  Respiratory:  Negative for cough, hemoptysis, sputum production, shortness of breath and wheezing.   Cardiovascular:  Negative for chest pain, palpitations, orthopnea and claudication.  Gastrointestinal:  Negative for abdominal pain, blood in stool, constipation, diarrhea, heartburn, melena, nausea and vomiting.  Genitourinary:  Negative for dysuria, flank pain,  frequency, hematuria and urgency.  Musculoskeletal:  Negative for back pain, joint pain and myalgias.  Skin:  Negative for rash.  Neurological:  Negative for dizziness, tingling, focal weakness, seizures, weakness and headaches.  Endo/Heme/Allergies:  Does not bruise/bleed easily.  Psychiatric/Behavioral:  Negative for depression and suicidal ideas. The patient does not have insomnia.       Allergies  Allergen Reactions   Penicillins Hives and Rash    Has patient had a PCN reaction causing immediate rash, facial/tongue/throat swelling, SOB or lightheadedness with hypotension: Yes Has patient had a PCN reaction causing severe rash involving mucus membranes or skin necrosis: Yes--all over body Has patient had a PCN reaction that required hospitalization: No  Has patient had a PCN reaction occurring within the last 10 years: No If all of the above answers are "NO", then may proceed with Cephalosporin use.      Past Medical History:  Diagnosis Date   Anemia    Arthritis    osteoarthritis of right knee   CHF (congestive heart failure) (HCC)    Chronic kidney disease    stage 3   Gout    Hepatitis C antibody test positive    Hyperlipidemia    Hypertension      Past Surgical History:  Procedure Laterality Date   A/V FISTULAGRAM N/A 09/22/2020   Procedure: A/V Fistulagram;  Surgeon: Algernon Huxley, MD;  Location: [place] [facility];  Service: Cardiovascular;  Laterality: N/A;   AV FISTULA PLACEMENT Left 11/27/2019   Procedure: ARTERIOVENOUS (AV) Brachiocephalic FISTULA CREATION, possible graft;  Surgeon: Katha Cabal, MD;  Location: [facility];  Service: Vascular;  Laterality: Left;   CARDIAC CATHETERIZATION  COLONOSCOPY WITH PROPOFOL N/A 03/22/2021   Procedure: COLONOSCOPY WITH PROPOFOL;  Surgeon: Toledo, Benay Pike, MD;  Location: [facility];  Service: Gastroenterology;  Laterality: N/A;   DIAGNOSTIC LAPAROSCOPY     ESOPHAGOGASTRODUODENOSCOPY N/A 03/22/2021   Procedure:  ESOPHAGOGASTRODUODENOSCOPY (EGD);  Surgeon: Toledo, Benay Pike, MD;  Location: [facility];  Service: Gastroenterology;  Laterality: N/A;   LAPAROSCOPIC LYSIS OF ADHESIONS  08/12/2019   Procedure: LAPAROSCOPIC LYSIS OF ADHESIONS;  Surgeon: Jules Husbands, MD;  Location: [facility];  Service: General;;   PARTIAL KNEE ARTHROPLASTY Right 06/11/2017   Procedure: UNICOMPARTMENTAL KNEE;  Surgeon: Corky Mull, MD;  Location: [facility];  Service: Orthopedics;  Laterality: Right;   REPLACEMENT TOTAL KNEE Left 2012   TEMPORARY DIALYSIS CATHETER  09/20/2020   Procedure: TEMPORARY DIALYSIS CATHETER;  Surgeon: Katha Cabal, MD;  Location: [place] [facility];  Service: Cardiovascular;;    Social History   Socioeconomic History   Marital status: Married    Spouse name: Not on file   Number of children: Not on file   Years of education: Not on file   Highest education level: Not on file  Occupational History   Not on file  Tobacco Use   Smoking status: Every Day    Packs/day: 0.50    Types: Cigarettes   Smokeless tobacco: Never  Vaping Use   Vaping Use: Never used  Substance and Sexual Activity   Alcohol use: Never   Drug use: Not Currently    Comment: 30 years ago   Sexual activity: Not on file  Other Topics Concern   Not on file  Social History Narrative   Not on file   Social Determinants of Health   Financial Resource Strain: Not on file  Food Insecurity: No Food Insecurity (09/19/2021)   Hunger Vital Sign    Worried About Running Out of Food in the Last Year: Never true    Ran Out of Food in the Last Year: Never true  Transportation Needs: No Transportation Needs (09/19/2021)   PRAPARE - Hydrologist (Medical): No    Lack of Transportation (Non-Medical): No  Physical Activity: Not on file  Stress: Not on file  Social Connections: Not on file  Intimate Partner Violence: Not on file    Family History  Problem Relation Age of Onset   Colon  cancer Mother      Current Outpatient Medications:    allopurinol (ZYLOPRIM) 100 MG tablet, Take 100 mg by mouth every morning. , Disp: , Rfl:    aspirin EC 325 MG EC tablet, Take 1 tablet (325 mg total) by mouth daily., Disp: 30 tablet, Rfl: 0   atorvastatin (LIPITOR) 40 MG tablet, Take 40 mg by mouth every morning. , Disp: , Rfl:    calcitRIOL (ROCALTROL) 0.25 MCG capsule, Take 0.25 mcg by mouth daily., Disp: , Rfl:    Calcium 500-100 MG-UNIT CHEW, Chew 500 mg by mouth., Disp: , Rfl:    losartan (COZAAR) 25 MG tablet, Take 1 tablet by mouth at bedtime., Disp: , Rfl:    polyethylene glycol-electrolytes (NULYTELY) 420 g solution, Take by mouth., Disp: , Rfl:    amLODipine (NORVASC) 10 MG tablet, Take 1 tablet (10 mg total) by mouth daily., Disp: 30 tablet, Rfl: 2   isosorbide mononitrate (IMDUR) 60 MG 24 hr tablet, Take 1 tablet (60 mg total) by mouth every morning., Disp: 30 tablet, Rfl: 2   losartan (COZAAR) 25 MG tablet, Take 1 tablet (25  mg total) by mouth at bedtime., Disp: 30 tablet, Rfl: 2   metoprolol (TOPROL-XL) 200 MG 24 hr tablet, Take 1 tablet (200 mg total) by mouth every morning., Disp: 30 tablet, Rfl: 2   torsemide 40 MG TABS, Take 40 mg by mouth daily. (Patient not taking: Reported on 07/21/2021), Disp: 30 tablet, Rfl: 2  Physical exam:  Vitals:   11/20/21 1009  BP: (!) 147/85  Pulse: 64  Temp: 97.6 F (36.4 C)  TempSrc: Tympanic  Weight: 180 lb (81.6 kg)   Physical Exam Constitutional:      General: She is not in acute distress. Cardiovascular:     Rate and Rhythm: Normal rate and regular rhythm.     Heart sounds: Murmur heard.  Pulmonary:     Effort: Pulmonary effort is normal.     Breath sounds: Normal breath sounds.  Abdominal:     General: Bowel sounds are normal.     Palpations: Abdomen is soft.  Skin:    General: Skin is warm and dry.  Neurological:     Mental Status: She is alert and oriented to person, place, and time.         Latest Ref Rng &  Units 03/22/2021    9:37 AM  CMP  Glucose 70 - 99 mg/dL 89   BUN 8 - 23 mg/dL 74   Creatinine 0.44 - 1.00 mg/dL 6.30   Sodium 135 - 145 mmol/L 143   Potassium 3.5 - 5.1 mmol/L 4.6   Chloride 98 - 111 mmol/L 110       Latest Ref Rng & Units 11/20/2021    9:56 AM  CBC  WBC 4.0 - 10.5 K/uL 7.3   Hemoglobin 12.0 - 15.0 g/dL 12.0   Hematocrit 36.0 - 46.0 % 38.2   Platelets 150 - 400 K/uL 156      Assessment and plan- Patient is a 71 y.o. female with history of anemia of chronic kidney disease currently on dialysis here for routine follow-up  Patient's hemoglobin has been stable around 12 for the last 3 to 4 months.  She will be continuing to receive Venofer and EPO through dialysis.  I will monitor her labs every 6 months and see her back in 1 year and if overall they remain stable she will not require any further follow-up with me   Visit Diagnosis 1. Anemia in chronic kidney disease, on chronic dialysis ([place])   2. Iron deficiency anemia, unspecified iron deficiency anemia type      Dr. Randa Evens, MD, MPH [facility] [phone] 11/20/2021 12:39 PM

## 2021-11-21 DIAGNOSIS — Z992 Dependence on renal dialysis: Secondary | ICD-10-CM | POA: Diagnosis not present

## 2021-11-21 DIAGNOSIS — N186 End stage renal disease: Secondary | ICD-10-CM | POA: Diagnosis not present

## 2021-11-23 DIAGNOSIS — Z992 Dependence on renal dialysis: Secondary | ICD-10-CM | POA: Diagnosis not present

## 2021-11-23 DIAGNOSIS — N186 End stage renal disease: Secondary | ICD-10-CM | POA: Diagnosis not present

## 2021-11-25 DIAGNOSIS — N186 End stage renal disease: Secondary | ICD-10-CM | POA: Diagnosis not present

## 2021-11-25 DIAGNOSIS — Z992 Dependence on renal dialysis: Secondary | ICD-10-CM | POA: Diagnosis not present

## 2021-11-26 NOTE — Progress Notes (Unsigned)
MRN : [id number]  Anna Key is a 71 y.o. (06/08/1950) female who presents with chief complaint of check access.  History of Present Illness:   The patient returns to the office for followup of their dialysis access.   The patient reports the function of the access has been stable. Patient denies difficulty with cannulation. The patient denies increased bleeding time after removing the needles. The patient denies hand pain or other symptoms consistent with steal phenomena.  No significant arm swelling.  The patient denies any complaints from the dialysis center or their nephrologist.  The patient denies redness or swelling at the access site. The patient denies fever or chills at home or while on dialysis.  No recent shortening of the patient's walking distance or new symptoms consistent with claudication.  No history of rest pain symptoms. No new ulcers or wounds of the lower extremities have occurred.  The patient denies amaurosis fugax or recent TIA symptoms. There are no recent neurological changes noted. There is no history of DVT, PE or superficial thrombophlebitis. No recent episodes of angina or shortness of breath documented.   Duplex ultrasound of the AV access shows a patent access.  The previously noted stenosis is not significantly changed compared to last study.  Flow volume today is 1490 cc/min      No outpatient medications have been marked as taking for the 11/27/21 encounter (Appointment) with Delana Meyer, Dolores Lory, MD.    Past Medical History:  Diagnosis Date   Anemia    Arthritis    osteoarthritis of right knee   CHF (congestive heart failure) ([place])    Chronic kidney disease    stage 3   Gout    Hepatitis C antibody test positive    Hyperlipidemia    Hypertension     Past Surgical History:  Procedure Laterality Date   A/V FISTULAGRAM N/A 09/22/2020   Procedure: A/V Fistulagram;  Surgeon: Algernon Huxley, MD;  Location: [place] [facility];  Service: Cardiovascular;  Laterality: N/A;   AV FISTULA PLACEMENT Left 11/27/2019   Procedure: ARTERIOVENOUS (AV) Brachiocephalic FISTULA CREATION, possible graft;  Surgeon: Katha Cabal, MD;  Location: [facility];  Service: Vascular;  Laterality: Left;   CARDIAC CATHETERIZATION     COLONOSCOPY WITH PROPOFOL N/A 03/22/2021   Procedure: COLONOSCOPY WITH PROPOFOL;  Surgeon: Toledo, Benay Pike, MD;  Location: [facility];  Service: Gastroenterology;  Laterality: N/A;   DIAGNOSTIC LAPAROSCOPY     ESOPHAGOGASTRODUODENOSCOPY N/A 03/22/2021   Procedure: ESOPHAGOGASTRODUODENOSCOPY (EGD);  Surgeon: Toledo, Benay Pike, MD;  Location: [facility];  Service: Gastroenterology;  Laterality: N/A;   LAPAROSCOPIC LYSIS OF ADHESIONS  08/12/2019   Procedure: LAPAROSCOPIC LYSIS OF ADHESIONS;  Surgeon: Jules Husbands, MD;  Location: [facility];  Service: General;;   PARTIAL KNEE ARTHROPLASTY Right 06/11/2017   Procedure: UNICOMPARTMENTAL KNEE;  Surgeon: Corky Mull, MD;  Location: [facility];  Service: Orthopedics;  Laterality: Right;   REPLACEMENT TOTAL KNEE Left 2012   TEMPORARY DIALYSIS CATHETER  09/20/2020   Procedure: TEMPORARY DIALYSIS CATHETER;  Surgeon: Katha Cabal, MD;  Location: [place] [facility];  Service: Cardiovascular;;    Social History Social History   Tobacco Use   Smoking status: Every Day    Packs/day: 0.50    Types: Cigarettes   Smokeless tobacco: Never  Vaping Use   Vaping Use:  Never used  Substance Use Topics   Alcohol use: Never   Drug use: Not Currently    Comment: 30 years ago    Family History Family History  Problem Relation Age of Onset   Colon cancer Mother     Allergies  Allergen Reactions   Penicillins Hives and Rash    Has patient had a PCN reaction causing immediate rash, facial/tongue/throat swelling, SOB or lightheadedness with hypotension: Yes Has patient had a PCN reaction causing severe rash involving mucus membranes or skin  necrosis: Yes--all over body Has patient had a PCN reaction that required hospitalization: No  Has patient had a PCN reaction occurring within the last 10 years: No If all of the above answers are "NO", then may proceed with Cephalosporin use.      REVIEW OF SYSTEMS (Negative unless checked)  Constitutional: '[]'$ Weight loss  '[]'$ Fever  '[]'$ Chills Cardiac: '[]'$ Chest pain   '[]'$ Chest pressure   '[]'$ Palpitations   '[]'$ Shortness of breath when laying flat   '[]'$ Shortness of breath with exertion. Vascular:  '[]'$ Pain in legs with walking   '[]'$ Pain in legs at rest  '[]'$ History of DVT   '[]'$ Phlebitis   '[]'$ Swelling in legs   '[]'$ Varicose veins   '[]'$ Non-healing ulcers Pulmonary:   '[]'$ Uses home oxygen   '[]'$ Productive cough   '[]'$ Hemoptysis   '[]'$ Wheeze  '[]'$ COPD   '[]'$ Asthma Neurologic:  '[]'$ Dizziness   '[]'$ Seizures   '[]'$ History of stroke   '[]'$ History of TIA  '[]'$ Aphasia   '[]'$ Vissual changes   '[]'$ Weakness or numbness in arm   '[]'$ Weakness or numbness in leg Musculoskeletal:   '[]'$ Joint swelling   '[]'$ Joint pain   '[]'$ Low back pain Hematologic:  '[]'$ Easy bruising  '[]'$ Easy bleeding   '[]'$ Hypercoagulable state   '[]'$ Anemic Gastrointestinal:  '[]'$ Diarrhea   '[]'$ Vomiting  '[]'$ Gastroesophageal reflux/heartburn   '[]'$ Difficulty swallowing. Genitourinary:  '[x]'$ Chronic kidney disease   '[]'$ Difficult urination  '[]'$ Frequent urination   '[]'$ Blood in urine Skin:  '[]'$ Rashes   '[]'$ Ulcers  Psychological:  '[]'$ History of anxiety   '[]'$  History of major depression.  Physical Examination  There were no vitals filed for this visit. There is no height or weight on file to calculate BMI. Gen: WD/WN, NAD Head: [place]/AT, No temporalis wasting.  Ear/Nose/Throat: Hearing grossly intact, nares w/o erythema or drainage Eyes: PER, EOMI, sclera nonicteric.  Neck: Supple, no gross masses or lesions.  No JVD.  Pulmonary:  Good air movement, no audible wheezing, no use of accessory muscles.  Cardiac: RRR, precordium non-hyperdynamic. Vascular:   aneurysmal but skin healthy, good thrill and good bruit Vessel  Right Left  Radial Palpable Palpable  Brachial Palpable Palpable  Gastrointestinal: soft, non-distended. No guarding/no peritoneal signs.  Musculoskeletal: M/S 5/5 throughout.  No deformity.  Neurologic: CN 2-12 intact. Pain and light touch intact in extremities.  Symmetrical.  Speech is fluent. Motor exam as listed above. Psychiatric: Judgment intact, Mood & affect appropriate for pt's clinical situation. Dermatologic: No rashes or ulcers noted.  No changes consistent with cellulitis.   CBC Lab Results  Component Value Date   WBC 7.3 11/20/2021   HGB 12.0 11/20/2021   HCT 38.2 11/20/2021   MCV 95.5 11/20/2021   PLT 156 11/20/2021    BMET    Component Value Date/Time   NA 143 03/22/2021 0937   NA 140 08/29/2011 1925   K 4.6 03/22/2021 0937   K 3.5 08/29/2011 1925   CL 110 03/22/2021 0937   CL 104 08/29/2011 1925   CO2 27 09/24/2020 0713   CO2 25 08/29/2011 1925  GLUCOSE 89 03/22/2021 0937   GLUCOSE 79 08/29/2011 1925   BUN 74 (H) 03/22/2021 0937   BUN 25 (H) 08/29/2011 1925   CREATININE 6.30 (H) 03/22/2021 0937   CREATININE 1.60 (H) 08/29/2011 1925   CALCIUM 7.8 (L) 09/24/2020 0713   CALCIUM 9.2 08/29/2011 1925   GFRNONAA 14 (L) 09/24/2020 0713   GFRNONAA 35 (L) 08/29/2011 1925   GFRAA 9 (L) 11/12/2019 1001   GFRAA 40 (L) 08/29/2011 1925   CrCl cannot be calculated (Patient's most recent lab result is older than the maximum 21 days allowed.).  COAG Lab Results  Component Value Date   INR 0.9 11/12/2019   INR 0.90 05/29/2017    Radiology No results found.   Assessment/Plan 1. End stage renal disease on dialysis [facility]) Recommend:  The patient is doing well and currently has adequate dialysis access. The patient's dialysis center is not reporting any access issues. Flow pattern is stable when compared to the prior ultrasound.  The patient should have a duplex ultrasound of the dialysis access in 6 months. The patient will follow-up with me in the office  after each ultrasound   - VAS [place] (AVF, AVG); Future  2. Essential hypertension Continue antihypertensive medications as already ordered, these medications have been reviewed and there are no changes at this time.  3. Coronary artery disease of native artery of native heart with stable angina pectoris (HCC) Continue cardiac and antihypertensive medications as already ordered and reviewed, no changes at this time.  Continue statin as ordered and reviewed, no changes at this time  Nitrates PRN for chest pain  4. Mixed hyperlipidemia Continue statin as ordered and reviewed, no changes at this time    Hortencia Pilar, MD  11/26/2021 11:04 AM

## 2021-11-27 ENCOUNTER — Encounter (INDEPENDENT_AMBULATORY_CARE_PROVIDER_SITE_OTHER): Payer: Self-pay | Admitting: Vascular Surgery

## 2021-11-27 ENCOUNTER — Ambulatory Visit (INDEPENDENT_AMBULATORY_CARE_PROVIDER_SITE_OTHER): Payer: Medicare HMO | Admitting: Vascular Surgery

## 2021-11-27 ENCOUNTER — Ambulatory Visit (INDEPENDENT_AMBULATORY_CARE_PROVIDER_SITE_OTHER): Payer: Medicare HMO

## 2021-11-27 VITALS — BP 154/81 | HR 69 | Resp 16 | Ht 64.0 in | Wt 176.0 lb

## 2021-11-27 DIAGNOSIS — N186 End stage renal disease: Secondary | ICD-10-CM

## 2021-11-27 DIAGNOSIS — I25118 Atherosclerotic heart disease of native coronary artery with other forms of angina pectoris: Secondary | ICD-10-CM | POA: Diagnosis not present

## 2021-11-27 DIAGNOSIS — I10 Essential (primary) hypertension: Secondary | ICD-10-CM

## 2021-11-27 DIAGNOSIS — N185 Chronic kidney disease, stage 5: Secondary | ICD-10-CM | POA: Diagnosis not present

## 2021-11-27 DIAGNOSIS — Z992 Dependence on renal dialysis: Secondary | ICD-10-CM

## 2021-11-27 DIAGNOSIS — E782 Mixed hyperlipidemia: Secondary | ICD-10-CM

## 2021-11-28 DIAGNOSIS — Z992 Dependence on renal dialysis: Secondary | ICD-10-CM | POA: Diagnosis not present

## 2021-11-28 DIAGNOSIS — N186 End stage renal disease: Secondary | ICD-10-CM | POA: Diagnosis not present

## 2021-11-29 ENCOUNTER — Encounter (INDEPENDENT_AMBULATORY_CARE_PROVIDER_SITE_OTHER): Payer: Self-pay | Admitting: Vascular Surgery

## 2021-11-29 DIAGNOSIS — N186 End stage renal disease: Secondary | ICD-10-CM | POA: Insufficient documentation

## 2021-11-30 DIAGNOSIS — N186 End stage renal disease: Secondary | ICD-10-CM | POA: Diagnosis not present

## 2021-11-30 DIAGNOSIS — Z992 Dependence on renal dialysis: Secondary | ICD-10-CM | POA: Diagnosis not present

## 2021-12-02 DIAGNOSIS — Z992 Dependence on renal dialysis: Secondary | ICD-10-CM | POA: Diagnosis not present

## 2021-12-02 DIAGNOSIS — N186 End stage renal disease: Secondary | ICD-10-CM | POA: Diagnosis not present

## 2021-12-05 DIAGNOSIS — Z992 Dependence on renal dialysis: Secondary | ICD-10-CM | POA: Diagnosis not present

## 2021-12-05 DIAGNOSIS — N186 End stage renal disease: Secondary | ICD-10-CM | POA: Diagnosis not present

## 2021-12-09 DIAGNOSIS — Z992 Dependence on renal dialysis: Secondary | ICD-10-CM | POA: Diagnosis not present

## 2021-12-09 DIAGNOSIS — N186 End stage renal disease: Secondary | ICD-10-CM | POA: Diagnosis not present

## 2021-12-12 DIAGNOSIS — Z992 Dependence on renal dialysis: Secondary | ICD-10-CM | POA: Diagnosis not present

## 2021-12-12 DIAGNOSIS — N186 End stage renal disease: Secondary | ICD-10-CM | POA: Diagnosis not present

## 2021-12-14 DIAGNOSIS — Z992 Dependence on renal dialysis: Secondary | ICD-10-CM | POA: Diagnosis not present

## 2021-12-14 DIAGNOSIS — N186 End stage renal disease: Secondary | ICD-10-CM | POA: Diagnosis not present

## 2021-12-16 DIAGNOSIS — N186 End stage renal disease: Secondary | ICD-10-CM | POA: Diagnosis not present

## 2021-12-16 DIAGNOSIS — Z992 Dependence on renal dialysis: Secondary | ICD-10-CM | POA: Diagnosis not present

## 2021-12-21 DIAGNOSIS — Z992 Dependence on renal dialysis: Secondary | ICD-10-CM | POA: Diagnosis not present

## 2021-12-21 DIAGNOSIS — N186 End stage renal disease: Secondary | ICD-10-CM | POA: Diagnosis not present

## 2021-12-23 DIAGNOSIS — N186 End stage renal disease: Secondary | ICD-10-CM | POA: Diagnosis not present

## 2021-12-23 DIAGNOSIS — Z992 Dependence on renal dialysis: Secondary | ICD-10-CM | POA: Diagnosis not present

## 2021-12-26 DIAGNOSIS — Z992 Dependence on renal dialysis: Secondary | ICD-10-CM | POA: Diagnosis not present

## 2021-12-26 DIAGNOSIS — N186 End stage renal disease: Secondary | ICD-10-CM | POA: Diagnosis not present

## 2021-12-28 DIAGNOSIS — Z992 Dependence on renal dialysis: Secondary | ICD-10-CM | POA: Diagnosis not present

## 2021-12-28 DIAGNOSIS — N186 End stage renal disease: Secondary | ICD-10-CM | POA: Diagnosis not present

## 2021-12-30 DIAGNOSIS — N186 End stage renal disease: Secondary | ICD-10-CM | POA: Diagnosis not present

## 2021-12-30 DIAGNOSIS — Z992 Dependence on renal dialysis: Secondary | ICD-10-CM | POA: Diagnosis not present

## 2022-01-01 DIAGNOSIS — Z992 Dependence on renal dialysis: Secondary | ICD-10-CM | POA: Diagnosis not present

## 2022-01-01 DIAGNOSIS — N186 End stage renal disease: Secondary | ICD-10-CM | POA: Diagnosis not present

## 2022-01-06 DIAGNOSIS — Z992 Dependence on renal dialysis: Secondary | ICD-10-CM | POA: Diagnosis not present

## 2022-01-06 DIAGNOSIS — N186 End stage renal disease: Secondary | ICD-10-CM | POA: Diagnosis not present

## 2022-01-08 ENCOUNTER — Ambulatory Visit: Payer: Self-pay | Admitting: *Deleted

## 2022-01-08 NOTE — Patient Outreach (Signed)
  Care Coordination   01/08/2022 Name: MARQUITTA PERSICHETTI MRN: [id number] DOB: Oct 11, 1950   Care Coordination Outreach Attempts:  An unsuccessful telephone outreach was attempted for a scheduled appointment today.  Follow Up Plan:  Additional outreach attempts will be made to offer the patient care coordination information and services.   Encounter Outcome:  No Answer   Care Coordination Interventions:  No, not indicated    Valente David, RN, MSN, [facility] Care Management Care Management Coordinator [phone]

## 2022-01-09 DIAGNOSIS — Z992 Dependence on renal dialysis: Secondary | ICD-10-CM | POA: Diagnosis not present

## 2022-01-09 DIAGNOSIS — N186 End stage renal disease: Secondary | ICD-10-CM | POA: Diagnosis not present

## 2022-01-11 DIAGNOSIS — Z992 Dependence on renal dialysis: Secondary | ICD-10-CM | POA: Diagnosis not present

## 2022-01-11 DIAGNOSIS — N186 End stage renal disease: Secondary | ICD-10-CM | POA: Diagnosis not present

## 2022-01-12 ENCOUNTER — Telehealth: Payer: Self-pay | Admitting: *Deleted

## 2022-01-12 NOTE — Progress Notes (Signed)
  Care Coordination Note  01/12/2022 Name: Anna Key MRN: [id number] DOB: 1950/12/23  Anna Key is a 71 y.o. year old female who is a primary care patient of Kirk Ruths, MD and is actively engaged with the care management team. I reached out to [name] by phone today to assist with re-scheduling a follow up visit with the RN Case Manager  Follow up plan: Pt was not clear ever making an appt, attempted to explain program and call was disconnected   Julian Hy, [place] Direct Dial: [phone]

## 2022-01-13 DIAGNOSIS — N186 End stage renal disease: Secondary | ICD-10-CM | POA: Diagnosis not present

## 2022-01-13 DIAGNOSIS — Z992 Dependence on renal dialysis: Secondary | ICD-10-CM | POA: Diagnosis not present

## 2022-01-16 DIAGNOSIS — Z992 Dependence on renal dialysis: Secondary | ICD-10-CM | POA: Diagnosis not present

## 2022-01-16 DIAGNOSIS — N186 End stage renal disease: Secondary | ICD-10-CM | POA: Diagnosis not present

## 2022-01-18 DIAGNOSIS — Z992 Dependence on renal dialysis: Secondary | ICD-10-CM | POA: Diagnosis not present

## 2022-01-18 DIAGNOSIS — N186 End stage renal disease: Secondary | ICD-10-CM | POA: Diagnosis not present

## 2022-01-20 DIAGNOSIS — N186 End stage renal disease: Secondary | ICD-10-CM | POA: Diagnosis not present

## 2022-01-20 DIAGNOSIS — Z992 Dependence on renal dialysis: Secondary | ICD-10-CM | POA: Diagnosis not present

## 2022-01-23 DIAGNOSIS — Z992 Dependence on renal dialysis: Secondary | ICD-10-CM | POA: Diagnosis not present

## 2022-01-23 DIAGNOSIS — N186 End stage renal disease: Secondary | ICD-10-CM | POA: Diagnosis not present

## 2022-01-24 ENCOUNTER — Ambulatory Visit: Payer: Medicare HMO | Admitting: Oncology

## 2022-01-24 ENCOUNTER — Other Ambulatory Visit: Payer: Medicare HMO

## 2022-01-25 DIAGNOSIS — N186 End stage renal disease: Secondary | ICD-10-CM | POA: Diagnosis not present

## 2022-01-25 DIAGNOSIS — Z992 Dependence on renal dialysis: Secondary | ICD-10-CM | POA: Diagnosis not present

## 2022-01-30 DIAGNOSIS — N186 End stage renal disease: Secondary | ICD-10-CM | POA: Diagnosis not present

## 2022-01-30 DIAGNOSIS — Z992 Dependence on renal dialysis: Secondary | ICD-10-CM | POA: Diagnosis not present

## 2022-02-01 DIAGNOSIS — N186 End stage renal disease: Secondary | ICD-10-CM | POA: Diagnosis not present

## 2022-02-01 DIAGNOSIS — Z992 Dependence on renal dialysis: Secondary | ICD-10-CM | POA: Diagnosis not present

## 2022-02-03 DIAGNOSIS — N186 End stage renal disease: Secondary | ICD-10-CM | POA: Diagnosis not present

## 2022-02-03 DIAGNOSIS — Z992 Dependence on renal dialysis: Secondary | ICD-10-CM | POA: Diagnosis not present

## 2022-02-06 DIAGNOSIS — Z79899 Other long term (current) drug therapy: Secondary | ICD-10-CM | POA: Diagnosis not present

## 2022-02-06 DIAGNOSIS — Z992 Dependence on renal dialysis: Secondary | ICD-10-CM | POA: Diagnosis not present

## 2022-02-06 DIAGNOSIS — N186 End stage renal disease: Secondary | ICD-10-CM | POA: Diagnosis not present

## 2022-02-08 DIAGNOSIS — Z992 Dependence on renal dialysis: Secondary | ICD-10-CM | POA: Diagnosis not present

## 2022-02-08 DIAGNOSIS — N186 End stage renal disease: Secondary | ICD-10-CM | POA: Diagnosis not present

## 2022-02-10 DIAGNOSIS — N186 End stage renal disease: Secondary | ICD-10-CM | POA: Diagnosis not present

## 2022-02-10 DIAGNOSIS — Z992 Dependence on renal dialysis: Secondary | ICD-10-CM | POA: Diagnosis not present

## 2022-02-11 DIAGNOSIS — Z992 Dependence on renal dialysis: Secondary | ICD-10-CM | POA: Diagnosis not present

## 2022-02-11 DIAGNOSIS — N186 End stage renal disease: Secondary | ICD-10-CM | POA: Diagnosis not present

## 2022-02-15 DIAGNOSIS — N186 End stage renal disease: Secondary | ICD-10-CM | POA: Diagnosis not present

## 2022-02-15 DIAGNOSIS — Z992 Dependence on renal dialysis: Secondary | ICD-10-CM | POA: Diagnosis not present

## 2022-02-20 DIAGNOSIS — N186 End stage renal disease: Secondary | ICD-10-CM | POA: Diagnosis not present

## 2022-02-20 DIAGNOSIS — Z992 Dependence on renal dialysis: Secondary | ICD-10-CM | POA: Diagnosis not present

## 2022-02-22 DIAGNOSIS — N186 End stage renal disease: Secondary | ICD-10-CM | POA: Diagnosis not present

## 2022-02-22 DIAGNOSIS — Z992 Dependence on renal dialysis: Secondary | ICD-10-CM | POA: Diagnosis not present

## 2022-02-24 DIAGNOSIS — N186 End stage renal disease: Secondary | ICD-10-CM | POA: Diagnosis not present

## 2022-02-24 DIAGNOSIS — Z992 Dependence on renal dialysis: Secondary | ICD-10-CM | POA: Diagnosis not present

## 2022-03-01 DIAGNOSIS — N186 End stage renal disease: Secondary | ICD-10-CM | POA: Diagnosis not present

## 2022-03-01 DIAGNOSIS — Z992 Dependence on renal dialysis: Secondary | ICD-10-CM | POA: Diagnosis not present

## 2022-03-03 DIAGNOSIS — N186 End stage renal disease: Secondary | ICD-10-CM | POA: Diagnosis not present

## 2022-03-03 DIAGNOSIS — Z992 Dependence on renal dialysis: Secondary | ICD-10-CM | POA: Diagnosis not present

## 2022-03-07 DIAGNOSIS — N186 End stage renal disease: Secondary | ICD-10-CM | POA: Diagnosis not present

## 2022-03-07 DIAGNOSIS — Z992 Dependence on renal dialysis: Secondary | ICD-10-CM | POA: Diagnosis not present

## 2022-03-10 DIAGNOSIS — Z992 Dependence on renal dialysis: Secondary | ICD-10-CM | POA: Diagnosis not present

## 2022-03-10 DIAGNOSIS — N186 End stage renal disease: Secondary | ICD-10-CM | POA: Diagnosis not present

## 2022-03-13 DIAGNOSIS — Z992 Dependence on renal dialysis: Secondary | ICD-10-CM | POA: Diagnosis not present

## 2022-03-13 DIAGNOSIS — N186 End stage renal disease: Secondary | ICD-10-CM | POA: Diagnosis not present

## 2022-03-14 DIAGNOSIS — Z992 Dependence on renal dialysis: Secondary | ICD-10-CM | POA: Diagnosis not present

## 2022-03-14 DIAGNOSIS — N186 End stage renal disease: Secondary | ICD-10-CM | POA: Diagnosis not present

## 2022-03-15 DIAGNOSIS — Z992 Dependence on renal dialysis: Secondary | ICD-10-CM | POA: Diagnosis not present

## 2022-03-15 DIAGNOSIS — N186 End stage renal disease: Secondary | ICD-10-CM | POA: Diagnosis not present

## 2022-03-17 DIAGNOSIS — Z992 Dependence on renal dialysis: Secondary | ICD-10-CM | POA: Diagnosis not present

## 2022-03-17 DIAGNOSIS — N186 End stage renal disease: Secondary | ICD-10-CM | POA: Diagnosis not present

## 2022-03-20 DIAGNOSIS — Z992 Dependence on renal dialysis: Secondary | ICD-10-CM | POA: Diagnosis not present

## 2022-03-20 DIAGNOSIS — N186 End stage renal disease: Secondary | ICD-10-CM | POA: Diagnosis not present

## 2022-03-22 DIAGNOSIS — N186 End stage renal disease: Secondary | ICD-10-CM | POA: Diagnosis not present

## 2022-03-22 DIAGNOSIS — Z992 Dependence on renal dialysis: Secondary | ICD-10-CM | POA: Diagnosis not present

## 2022-03-24 DIAGNOSIS — N186 End stage renal disease: Secondary | ICD-10-CM | POA: Diagnosis not present

## 2022-03-24 DIAGNOSIS — Z992 Dependence on renal dialysis: Secondary | ICD-10-CM | POA: Diagnosis not present

## 2022-03-27 DIAGNOSIS — Z992 Dependence on renal dialysis: Secondary | ICD-10-CM | POA: Diagnosis not present

## 2022-03-27 DIAGNOSIS — N186 End stage renal disease: Secondary | ICD-10-CM | POA: Diagnosis not present

## 2022-03-29 DIAGNOSIS — Z992 Dependence on renal dialysis: Secondary | ICD-10-CM | POA: Diagnosis not present

## 2022-03-29 DIAGNOSIS — N186 End stage renal disease: Secondary | ICD-10-CM | POA: Diagnosis not present

## 2022-03-31 DIAGNOSIS — N186 End stage renal disease: Secondary | ICD-10-CM | POA: Diagnosis not present

## 2022-03-31 DIAGNOSIS — Z992 Dependence on renal dialysis: Secondary | ICD-10-CM | POA: Diagnosis not present

## 2022-04-03 DIAGNOSIS — Z992 Dependence on renal dialysis: Secondary | ICD-10-CM | POA: Diagnosis not present

## 2022-04-03 DIAGNOSIS — N186 End stage renal disease: Secondary | ICD-10-CM | POA: Diagnosis not present

## 2022-04-05 DIAGNOSIS — N186 End stage renal disease: Secondary | ICD-10-CM | POA: Diagnosis not present

## 2022-04-05 DIAGNOSIS — Z992 Dependence on renal dialysis: Secondary | ICD-10-CM | POA: Diagnosis not present

## 2022-04-07 DIAGNOSIS — N186 End stage renal disease: Secondary | ICD-10-CM | POA: Diagnosis not present

## 2022-04-07 DIAGNOSIS — Z992 Dependence on renal dialysis: Secondary | ICD-10-CM | POA: Diagnosis not present

## 2022-04-10 DIAGNOSIS — N186 End stage renal disease: Secondary | ICD-10-CM | POA: Diagnosis not present

## 2022-04-10 DIAGNOSIS — Z992 Dependence on renal dialysis: Secondary | ICD-10-CM | POA: Diagnosis not present

## 2022-04-12 DIAGNOSIS — N186 End stage renal disease: Secondary | ICD-10-CM | POA: Diagnosis not present

## 2022-04-12 DIAGNOSIS — Z992 Dependence on renal dialysis: Secondary | ICD-10-CM | POA: Diagnosis not present

## 2022-04-14 DIAGNOSIS — N186 End stage renal disease: Secondary | ICD-10-CM | POA: Diagnosis not present

## 2022-04-14 DIAGNOSIS — Z992 Dependence on renal dialysis: Secondary | ICD-10-CM | POA: Diagnosis not present

## 2022-04-14 DIAGNOSIS — N2581 Secondary hyperparathyroidism of renal origin: Secondary | ICD-10-CM | POA: Diagnosis not present

## 2022-04-17 DIAGNOSIS — Z992 Dependence on renal dialysis: Secondary | ICD-10-CM | POA: Diagnosis not present

## 2022-04-17 DIAGNOSIS — N2581 Secondary hyperparathyroidism of renal origin: Secondary | ICD-10-CM | POA: Diagnosis not present

## 2022-04-17 DIAGNOSIS — N186 End stage renal disease: Secondary | ICD-10-CM | POA: Diagnosis not present

## 2022-04-18 DIAGNOSIS — N185 Chronic kidney disease, stage 5: Secondary | ICD-10-CM | POA: Diagnosis not present

## 2022-04-18 DIAGNOSIS — I1311 Hypertensive heart and chronic kidney disease without heart failure, with stage 5 chronic kidney disease, or end stage renal disease: Secondary | ICD-10-CM | POA: Diagnosis not present

## 2022-04-18 DIAGNOSIS — I25119 Atherosclerotic heart disease of native coronary artery with unspecified angina pectoris: Secondary | ICD-10-CM | POA: Diagnosis not present

## 2022-04-18 DIAGNOSIS — R7309 Other abnormal glucose: Secondary | ICD-10-CM | POA: Diagnosis not present

## 2022-04-19 DIAGNOSIS — Z992 Dependence on renal dialysis: Secondary | ICD-10-CM | POA: Diagnosis not present

## 2022-04-19 DIAGNOSIS — N186 End stage renal disease: Secondary | ICD-10-CM | POA: Diagnosis not present

## 2022-04-19 DIAGNOSIS — N2581 Secondary hyperparathyroidism of renal origin: Secondary | ICD-10-CM | POA: Diagnosis not present

## 2022-04-21 DIAGNOSIS — Z992 Dependence on renal dialysis: Secondary | ICD-10-CM | POA: Diagnosis not present

## 2022-04-21 DIAGNOSIS — N186 End stage renal disease: Secondary | ICD-10-CM | POA: Diagnosis not present

## 2022-04-21 DIAGNOSIS — N2581 Secondary hyperparathyroidism of renal origin: Secondary | ICD-10-CM | POA: Diagnosis not present

## 2022-04-24 DIAGNOSIS — Z992 Dependence on renal dialysis: Secondary | ICD-10-CM | POA: Diagnosis not present

## 2022-04-24 DIAGNOSIS — N186 End stage renal disease: Secondary | ICD-10-CM | POA: Diagnosis not present

## 2022-04-24 DIAGNOSIS — N2581 Secondary hyperparathyroidism of renal origin: Secondary | ICD-10-CM | POA: Diagnosis not present

## 2022-04-26 DIAGNOSIS — N2581 Secondary hyperparathyroidism of renal origin: Secondary | ICD-10-CM | POA: Diagnosis not present

## 2022-04-26 DIAGNOSIS — N186 End stage renal disease: Secondary | ICD-10-CM | POA: Diagnosis not present

## 2022-04-26 DIAGNOSIS — Z992 Dependence on renal dialysis: Secondary | ICD-10-CM | POA: Diagnosis not present

## 2022-04-28 DIAGNOSIS — Z992 Dependence on renal dialysis: Secondary | ICD-10-CM | POA: Diagnosis not present

## 2022-04-28 DIAGNOSIS — N186 End stage renal disease: Secondary | ICD-10-CM | POA: Diagnosis not present

## 2022-04-28 DIAGNOSIS — N2581 Secondary hyperparathyroidism of renal origin: Secondary | ICD-10-CM | POA: Diagnosis not present

## 2022-05-01 DIAGNOSIS — N186 End stage renal disease: Secondary | ICD-10-CM | POA: Diagnosis not present

## 2022-05-01 DIAGNOSIS — Z992 Dependence on renal dialysis: Secondary | ICD-10-CM | POA: Diagnosis not present

## 2022-05-01 DIAGNOSIS — N2581 Secondary hyperparathyroidism of renal origin: Secondary | ICD-10-CM | POA: Diagnosis not present

## 2022-05-03 DIAGNOSIS — N2581 Secondary hyperparathyroidism of renal origin: Secondary | ICD-10-CM | POA: Diagnosis not present

## 2022-05-03 DIAGNOSIS — Z992 Dependence on renal dialysis: Secondary | ICD-10-CM | POA: Diagnosis not present

## 2022-05-03 DIAGNOSIS — N186 End stage renal disease: Secondary | ICD-10-CM | POA: Diagnosis not present

## 2022-05-05 DIAGNOSIS — N186 End stage renal disease: Secondary | ICD-10-CM | POA: Diagnosis not present

## 2022-05-05 DIAGNOSIS — Z992 Dependence on renal dialysis: Secondary | ICD-10-CM | POA: Diagnosis not present

## 2022-05-05 DIAGNOSIS — N2581 Secondary hyperparathyroidism of renal origin: Secondary | ICD-10-CM | POA: Diagnosis not present

## 2022-05-10 DIAGNOSIS — N186 End stage renal disease: Secondary | ICD-10-CM | POA: Diagnosis not present

## 2022-05-10 DIAGNOSIS — Z992 Dependence on renal dialysis: Secondary | ICD-10-CM | POA: Diagnosis not present

## 2022-05-10 DIAGNOSIS — N2581 Secondary hyperparathyroidism of renal origin: Secondary | ICD-10-CM | POA: Diagnosis not present

## 2022-05-12 DIAGNOSIS — N2581 Secondary hyperparathyroidism of renal origin: Secondary | ICD-10-CM | POA: Diagnosis not present

## 2022-05-12 DIAGNOSIS — N186 End stage renal disease: Secondary | ICD-10-CM | POA: Diagnosis not present

## 2022-05-12 DIAGNOSIS — Z992 Dependence on renal dialysis: Secondary | ICD-10-CM | POA: Diagnosis not present

## 2022-05-13 DIAGNOSIS — N186 End stage renal disease: Secondary | ICD-10-CM | POA: Diagnosis not present

## 2022-05-13 DIAGNOSIS — Z992 Dependence on renal dialysis: Secondary | ICD-10-CM | POA: Diagnosis not present

## 2022-05-15 ENCOUNTER — Encounter: Payer: Self-pay | Admitting: Oncology

## 2022-05-15 DIAGNOSIS — N2581 Secondary hyperparathyroidism of renal origin: Secondary | ICD-10-CM | POA: Diagnosis not present

## 2022-05-15 DIAGNOSIS — N186 End stage renal disease: Secondary | ICD-10-CM | POA: Diagnosis not present

## 2022-05-15 DIAGNOSIS — Z992 Dependence on renal dialysis: Secondary | ICD-10-CM | POA: Diagnosis not present

## 2022-05-19 DIAGNOSIS — N186 End stage renal disease: Secondary | ICD-10-CM | POA: Diagnosis not present

## 2022-05-19 DIAGNOSIS — N2581 Secondary hyperparathyroidism of renal origin: Secondary | ICD-10-CM | POA: Diagnosis not present

## 2022-05-19 DIAGNOSIS — Z992 Dependence on renal dialysis: Secondary | ICD-10-CM | POA: Diagnosis not present

## 2022-05-22 ENCOUNTER — Inpatient Hospital Stay: Payer: Medicare HMO

## 2022-05-22 DIAGNOSIS — N186 End stage renal disease: Secondary | ICD-10-CM | POA: Diagnosis not present

## 2022-05-22 DIAGNOSIS — N2581 Secondary hyperparathyroidism of renal origin: Secondary | ICD-10-CM | POA: Diagnosis not present

## 2022-05-22 DIAGNOSIS — Z992 Dependence on renal dialysis: Secondary | ICD-10-CM | POA: Diagnosis not present

## 2022-05-23 ENCOUNTER — Inpatient Hospital Stay: Payer: Medicare HMO

## 2022-05-24 DIAGNOSIS — N186 End stage renal disease: Secondary | ICD-10-CM | POA: Diagnosis not present

## 2022-05-24 DIAGNOSIS — N2581 Secondary hyperparathyroidism of renal origin: Secondary | ICD-10-CM | POA: Diagnosis not present

## 2022-05-24 DIAGNOSIS — Z992 Dependence on renal dialysis: Secondary | ICD-10-CM | POA: Diagnosis not present

## 2022-05-25 ENCOUNTER — Inpatient Hospital Stay: Payer: Medicare HMO | Attending: Oncology

## 2022-05-25 DIAGNOSIS — D631 Anemia in chronic kidney disease: Secondary | ICD-10-CM | POA: Insufficient documentation

## 2022-05-25 DIAGNOSIS — N185 Chronic kidney disease, stage 5: Secondary | ICD-10-CM | POA: Diagnosis not present

## 2022-05-25 LAB — IRON AND TIBC
Iron: 55 ug/dL (ref 28–170)
Saturation Ratios: 21 % (ref 10.4–31.8)
TIBC: 263 ug/dL (ref 250–450)
UIBC: 208 ug/dL

## 2022-05-25 LAB — CBC WITH DIFFERENTIAL/PLATELET
Abs Immature Granulocytes: 0.02 10*3/uL (ref 0.00–0.07)
Basophils Absolute: 0 10*3/uL (ref 0.0–0.1)
Basophils Relative: 0 %
Eosinophils Absolute: 0.1 10*3/uL (ref 0.0–0.5)
Eosinophils Relative: 1 %
HCT: 35.8 % — ABNORMAL LOW (ref 36.0–46.0)
Hemoglobin: 10.9 g/dL — ABNORMAL LOW (ref 12.0–15.0)
Immature Granulocytes: 0 %
Lymphocytes Relative: 17 %
Lymphs Abs: 1 10*3/uL (ref 0.7–4.0)
MCH: 29.7 pg (ref 26.0–34.0)
MCHC: 30.4 g/dL (ref 30.0–36.0)
MCV: 97.5 fL (ref 80.0–100.0)
Monocytes Absolute: 0.5 10*3/uL (ref 0.1–1.0)
Monocytes Relative: 8 %
Neutro Abs: 4.5 10*3/uL (ref 1.7–7.7)
Neutrophils Relative %: 74 %
Platelets: 153 10*3/uL (ref 150–400)
RBC: 3.67 MIL/uL — ABNORMAL LOW (ref 3.87–5.11)
RDW: 15.7 % — ABNORMAL HIGH (ref 11.5–15.5)
WBC: 6.1 10*3/uL (ref 4.0–10.5)
nRBC: 0 % (ref 0.0–0.2)

## 2022-05-25 LAB — FERRITIN: Ferritin: 643 ng/mL — ABNORMAL HIGH (ref 11–307)

## 2022-05-26 DIAGNOSIS — Z992 Dependence on renal dialysis: Secondary | ICD-10-CM | POA: Diagnosis not present

## 2022-05-26 DIAGNOSIS — N2581 Secondary hyperparathyroidism of renal origin: Secondary | ICD-10-CM | POA: Diagnosis not present

## 2022-05-26 DIAGNOSIS — N186 End stage renal disease: Secondary | ICD-10-CM | POA: Diagnosis not present

## 2022-05-27 ENCOUNTER — Encounter: Payer: Self-pay | Admitting: Oncology

## 2022-05-29 DIAGNOSIS — Z992 Dependence on renal dialysis: Secondary | ICD-10-CM | POA: Diagnosis not present

## 2022-05-29 DIAGNOSIS — N2581 Secondary hyperparathyroidism of renal origin: Secondary | ICD-10-CM | POA: Diagnosis not present

## 2022-05-29 DIAGNOSIS — N186 End stage renal disease: Secondary | ICD-10-CM | POA: Diagnosis not present

## 2022-05-30 ENCOUNTER — Encounter (INDEPENDENT_AMBULATORY_CARE_PROVIDER_SITE_OTHER): Payer: Medicare HMO

## 2022-05-30 ENCOUNTER — Ambulatory Visit (INDEPENDENT_AMBULATORY_CARE_PROVIDER_SITE_OTHER): Payer: Medicare HMO | Admitting: Nurse Practitioner

## 2022-05-31 DIAGNOSIS — Z992 Dependence on renal dialysis: Secondary | ICD-10-CM | POA: Diagnosis not present

## 2022-05-31 DIAGNOSIS — N2581 Secondary hyperparathyroidism of renal origin: Secondary | ICD-10-CM | POA: Diagnosis not present

## 2022-05-31 DIAGNOSIS — N186 End stage renal disease: Secondary | ICD-10-CM | POA: Diagnosis not present

## 2022-06-02 DIAGNOSIS — Z992 Dependence on renal dialysis: Secondary | ICD-10-CM | POA: Diagnosis not present

## 2022-06-02 DIAGNOSIS — N2581 Secondary hyperparathyroidism of renal origin: Secondary | ICD-10-CM | POA: Diagnosis not present

## 2022-06-02 DIAGNOSIS — N186 End stage renal disease: Secondary | ICD-10-CM | POA: Diagnosis not present

## 2022-06-05 DIAGNOSIS — N2581 Secondary hyperparathyroidism of renal origin: Secondary | ICD-10-CM | POA: Diagnosis not present

## 2022-06-05 DIAGNOSIS — Z992 Dependence on renal dialysis: Secondary | ICD-10-CM | POA: Diagnosis not present

## 2022-06-05 DIAGNOSIS — N186 End stage renal disease: Secondary | ICD-10-CM | POA: Diagnosis not present

## 2022-06-07 DIAGNOSIS — Z992 Dependence on renal dialysis: Secondary | ICD-10-CM | POA: Diagnosis not present

## 2022-06-07 DIAGNOSIS — N186 End stage renal disease: Secondary | ICD-10-CM | POA: Diagnosis not present

## 2022-06-07 DIAGNOSIS — N2581 Secondary hyperparathyroidism of renal origin: Secondary | ICD-10-CM | POA: Diagnosis not present

## 2022-06-09 DIAGNOSIS — N186 End stage renal disease: Secondary | ICD-10-CM | POA: Diagnosis not present

## 2022-06-09 DIAGNOSIS — Z992 Dependence on renal dialysis: Secondary | ICD-10-CM | POA: Diagnosis not present

## 2022-06-09 DIAGNOSIS — N2581 Secondary hyperparathyroidism of renal origin: Secondary | ICD-10-CM | POA: Diagnosis not present

## 2022-06-12 DIAGNOSIS — N186 End stage renal disease: Secondary | ICD-10-CM | POA: Diagnosis not present

## 2022-06-12 DIAGNOSIS — Z992 Dependence on renal dialysis: Secondary | ICD-10-CM | POA: Diagnosis not present

## 2022-06-14 DIAGNOSIS — N186 End stage renal disease: Secondary | ICD-10-CM | POA: Diagnosis not present

## 2022-06-14 DIAGNOSIS — Z992 Dependence on renal dialysis: Secondary | ICD-10-CM | POA: Diagnosis not present

## 2022-06-14 DIAGNOSIS — N2581 Secondary hyperparathyroidism of renal origin: Secondary | ICD-10-CM | POA: Diagnosis not present

## 2022-06-16 DIAGNOSIS — N2581 Secondary hyperparathyroidism of renal origin: Secondary | ICD-10-CM | POA: Diagnosis not present

## 2022-06-16 DIAGNOSIS — N186 End stage renal disease: Secondary | ICD-10-CM | POA: Diagnosis not present

## 2022-06-16 DIAGNOSIS — Z992 Dependence on renal dialysis: Secondary | ICD-10-CM | POA: Diagnosis not present

## 2022-06-18 ENCOUNTER — Other Ambulatory Visit (INDEPENDENT_AMBULATORY_CARE_PROVIDER_SITE_OTHER): Payer: Self-pay | Admitting: Vascular Surgery

## 2022-06-18 DIAGNOSIS — N186 End stage renal disease: Secondary | ICD-10-CM

## 2022-06-19 DIAGNOSIS — Z992 Dependence on renal dialysis: Secondary | ICD-10-CM | POA: Diagnosis not present

## 2022-06-19 DIAGNOSIS — N186 End stage renal disease: Secondary | ICD-10-CM | POA: Diagnosis not present

## 2022-06-19 DIAGNOSIS — N2581 Secondary hyperparathyroidism of renal origin: Secondary | ICD-10-CM | POA: Diagnosis not present

## 2022-06-20 ENCOUNTER — Ambulatory Visit (INDEPENDENT_AMBULATORY_CARE_PROVIDER_SITE_OTHER): Payer: Medicare HMO | Admitting: Nurse Practitioner

## 2022-06-20 ENCOUNTER — Encounter (INDEPENDENT_AMBULATORY_CARE_PROVIDER_SITE_OTHER): Payer: Medicare HMO

## 2022-06-21 DIAGNOSIS — Z992 Dependence on renal dialysis: Secondary | ICD-10-CM | POA: Diagnosis not present

## 2022-06-21 DIAGNOSIS — N2581 Secondary hyperparathyroidism of renal origin: Secondary | ICD-10-CM | POA: Diagnosis not present

## 2022-06-21 DIAGNOSIS — N186 End stage renal disease: Secondary | ICD-10-CM | POA: Diagnosis not present

## 2022-06-23 DIAGNOSIS — N2581 Secondary hyperparathyroidism of renal origin: Secondary | ICD-10-CM | POA: Diagnosis not present

## 2022-06-23 DIAGNOSIS — N186 End stage renal disease: Secondary | ICD-10-CM | POA: Diagnosis not present

## 2022-06-23 DIAGNOSIS — Z992 Dependence on renal dialysis: Secondary | ICD-10-CM | POA: Diagnosis not present

## 2022-06-26 DIAGNOSIS — N2581 Secondary hyperparathyroidism of renal origin: Secondary | ICD-10-CM | POA: Diagnosis not present

## 2022-06-26 DIAGNOSIS — N186 End stage renal disease: Secondary | ICD-10-CM | POA: Diagnosis not present

## 2022-06-26 DIAGNOSIS — Z992 Dependence on renal dialysis: Secondary | ICD-10-CM | POA: Diagnosis not present

## 2022-06-28 DIAGNOSIS — Z992 Dependence on renal dialysis: Secondary | ICD-10-CM | POA: Diagnosis not present

## 2022-06-28 DIAGNOSIS — N186 End stage renal disease: Secondary | ICD-10-CM | POA: Diagnosis not present

## 2022-06-28 DIAGNOSIS — N2581 Secondary hyperparathyroidism of renal origin: Secondary | ICD-10-CM | POA: Diagnosis not present

## 2022-06-30 DIAGNOSIS — N2581 Secondary hyperparathyroidism of renal origin: Secondary | ICD-10-CM | POA: Diagnosis not present

## 2022-06-30 DIAGNOSIS — N186 End stage renal disease: Secondary | ICD-10-CM | POA: Diagnosis not present

## 2022-06-30 DIAGNOSIS — Z992 Dependence on renal dialysis: Secondary | ICD-10-CM | POA: Diagnosis not present

## 2022-07-03 DIAGNOSIS — Z992 Dependence on renal dialysis: Secondary | ICD-10-CM | POA: Diagnosis not present

## 2022-07-03 DIAGNOSIS — N186 End stage renal disease: Secondary | ICD-10-CM | POA: Diagnosis not present

## 2022-07-03 DIAGNOSIS — N2581 Secondary hyperparathyroidism of renal origin: Secondary | ICD-10-CM | POA: Diagnosis not present

## 2022-07-06 DIAGNOSIS — N2581 Secondary hyperparathyroidism of renal origin: Secondary | ICD-10-CM | POA: Diagnosis not present

## 2022-07-06 DIAGNOSIS — Z992 Dependence on renal dialysis: Secondary | ICD-10-CM | POA: Diagnosis not present

## 2022-07-06 DIAGNOSIS — N186 End stage renal disease: Secondary | ICD-10-CM | POA: Diagnosis not present

## 2022-07-07 DIAGNOSIS — N2581 Secondary hyperparathyroidism of renal origin: Secondary | ICD-10-CM | POA: Diagnosis not present

## 2022-07-07 DIAGNOSIS — Z992 Dependence on renal dialysis: Secondary | ICD-10-CM | POA: Diagnosis not present

## 2022-07-07 DIAGNOSIS — N186 End stage renal disease: Secondary | ICD-10-CM | POA: Diagnosis not present

## 2022-07-10 DIAGNOSIS — N2581 Secondary hyperparathyroidism of renal origin: Secondary | ICD-10-CM | POA: Diagnosis not present

## 2022-07-10 DIAGNOSIS — N186 End stage renal disease: Secondary | ICD-10-CM | POA: Diagnosis not present

## 2022-07-10 DIAGNOSIS — Z992 Dependence on renal dialysis: Secondary | ICD-10-CM | POA: Diagnosis not present

## 2022-07-12 DIAGNOSIS — Z992 Dependence on renal dialysis: Secondary | ICD-10-CM | POA: Diagnosis not present

## 2022-07-12 DIAGNOSIS — N2581 Secondary hyperparathyroidism of renal origin: Secondary | ICD-10-CM | POA: Diagnosis not present

## 2022-07-12 DIAGNOSIS — N186 End stage renal disease: Secondary | ICD-10-CM | POA: Diagnosis not present

## 2022-07-13 DIAGNOSIS — Z992 Dependence on renal dialysis: Secondary | ICD-10-CM | POA: Diagnosis not present

## 2022-07-13 DIAGNOSIS — N186 End stage renal disease: Secondary | ICD-10-CM | POA: Diagnosis not present

## 2022-07-14 DIAGNOSIS — Z992 Dependence on renal dialysis: Secondary | ICD-10-CM | POA: Diagnosis not present

## 2022-07-14 DIAGNOSIS — N186 End stage renal disease: Secondary | ICD-10-CM | POA: Diagnosis not present

## 2022-07-14 DIAGNOSIS — N2581 Secondary hyperparathyroidism of renal origin: Secondary | ICD-10-CM | POA: Diagnosis not present

## 2022-07-17 DIAGNOSIS — N186 End stage renal disease: Secondary | ICD-10-CM | POA: Diagnosis not present

## 2022-07-17 DIAGNOSIS — Z992 Dependence on renal dialysis: Secondary | ICD-10-CM | POA: Diagnosis not present

## 2022-07-17 DIAGNOSIS — N2581 Secondary hyperparathyroidism of renal origin: Secondary | ICD-10-CM | POA: Diagnosis not present

## 2022-07-19 DIAGNOSIS — N186 End stage renal disease: Secondary | ICD-10-CM | POA: Diagnosis not present

## 2022-07-19 DIAGNOSIS — N2581 Secondary hyperparathyroidism of renal origin: Secondary | ICD-10-CM | POA: Diagnosis not present

## 2022-07-19 DIAGNOSIS — Z992 Dependence on renal dialysis: Secondary | ICD-10-CM | POA: Diagnosis not present

## 2022-07-21 DIAGNOSIS — N186 End stage renal disease: Secondary | ICD-10-CM | POA: Diagnosis not present

## 2022-07-21 DIAGNOSIS — Z992 Dependence on renal dialysis: Secondary | ICD-10-CM | POA: Diagnosis not present

## 2022-07-21 DIAGNOSIS — N2581 Secondary hyperparathyroidism of renal origin: Secondary | ICD-10-CM | POA: Diagnosis not present

## 2022-07-24 DIAGNOSIS — Z992 Dependence on renal dialysis: Secondary | ICD-10-CM | POA: Diagnosis not present

## 2022-07-24 DIAGNOSIS — N186 End stage renal disease: Secondary | ICD-10-CM | POA: Diagnosis not present

## 2022-07-24 DIAGNOSIS — N2581 Secondary hyperparathyroidism of renal origin: Secondary | ICD-10-CM | POA: Diagnosis not present

## 2022-07-26 DIAGNOSIS — N2581 Secondary hyperparathyroidism of renal origin: Secondary | ICD-10-CM | POA: Diagnosis not present

## 2022-07-26 DIAGNOSIS — N186 End stage renal disease: Secondary | ICD-10-CM | POA: Diagnosis not present

## 2022-07-26 DIAGNOSIS — Z992 Dependence on renal dialysis: Secondary | ICD-10-CM | POA: Diagnosis not present

## 2022-07-28 DIAGNOSIS — Z992 Dependence on renal dialysis: Secondary | ICD-10-CM | POA: Diagnosis not present

## 2022-07-28 DIAGNOSIS — N186 End stage renal disease: Secondary | ICD-10-CM | POA: Diagnosis not present

## 2022-07-28 DIAGNOSIS — N2581 Secondary hyperparathyroidism of renal origin: Secondary | ICD-10-CM | POA: Diagnosis not present

## 2022-07-31 DIAGNOSIS — N186 End stage renal disease: Secondary | ICD-10-CM | POA: Diagnosis not present

## 2022-07-31 DIAGNOSIS — Z992 Dependence on renal dialysis: Secondary | ICD-10-CM | POA: Diagnosis not present

## 2022-07-31 DIAGNOSIS — N2581 Secondary hyperparathyroidism of renal origin: Secondary | ICD-10-CM | POA: Diagnosis not present

## 2022-08-03 DIAGNOSIS — N186 End stage renal disease: Secondary | ICD-10-CM | POA: Diagnosis not present

## 2022-08-03 DIAGNOSIS — Z992 Dependence on renal dialysis: Secondary | ICD-10-CM | POA: Diagnosis not present

## 2022-08-03 DIAGNOSIS — N2581 Secondary hyperparathyroidism of renal origin: Secondary | ICD-10-CM | POA: Diagnosis not present

## 2022-08-04 DIAGNOSIS — Z992 Dependence on renal dialysis: Secondary | ICD-10-CM | POA: Diagnosis not present

## 2022-08-04 DIAGNOSIS — N2581 Secondary hyperparathyroidism of renal origin: Secondary | ICD-10-CM | POA: Diagnosis not present

## 2022-08-04 DIAGNOSIS — N186 End stage renal disease: Secondary | ICD-10-CM | POA: Diagnosis not present

## 2022-08-07 DIAGNOSIS — N2581 Secondary hyperparathyroidism of renal origin: Secondary | ICD-10-CM | POA: Diagnosis not present

## 2022-08-07 DIAGNOSIS — N186 End stage renal disease: Secondary | ICD-10-CM | POA: Diagnosis not present

## 2022-08-07 DIAGNOSIS — Z992 Dependence on renal dialysis: Secondary | ICD-10-CM | POA: Diagnosis not present

## 2022-08-09 DIAGNOSIS — Z992 Dependence on renal dialysis: Secondary | ICD-10-CM | POA: Diagnosis not present

## 2022-08-09 DIAGNOSIS — N2581 Secondary hyperparathyroidism of renal origin: Secondary | ICD-10-CM | POA: Diagnosis not present

## 2022-08-09 DIAGNOSIS — N186 End stage renal disease: Secondary | ICD-10-CM | POA: Diagnosis not present

## 2022-08-11 DIAGNOSIS — N186 End stage renal disease: Secondary | ICD-10-CM | POA: Diagnosis not present

## 2022-08-11 DIAGNOSIS — N2581 Secondary hyperparathyroidism of renal origin: Secondary | ICD-10-CM | POA: Diagnosis not present

## 2022-08-11 DIAGNOSIS — Z992 Dependence on renal dialysis: Secondary | ICD-10-CM | POA: Diagnosis not present

## 2022-08-12 DIAGNOSIS — N186 End stage renal disease: Secondary | ICD-10-CM | POA: Diagnosis not present

## 2022-08-12 DIAGNOSIS — Z992 Dependence on renal dialysis: Secondary | ICD-10-CM | POA: Diagnosis not present

## 2022-08-14 DIAGNOSIS — N2581 Secondary hyperparathyroidism of renal origin: Secondary | ICD-10-CM | POA: Diagnosis not present

## 2022-08-14 DIAGNOSIS — N186 End stage renal disease: Secondary | ICD-10-CM | POA: Diagnosis not present

## 2022-08-14 DIAGNOSIS — Z992 Dependence on renal dialysis: Secondary | ICD-10-CM | POA: Diagnosis not present

## 2022-08-16 DIAGNOSIS — Z992 Dependence on renal dialysis: Secondary | ICD-10-CM | POA: Diagnosis not present

## 2022-08-16 DIAGNOSIS — N186 End stage renal disease: Secondary | ICD-10-CM | POA: Diagnosis not present

## 2022-08-16 DIAGNOSIS — N2581 Secondary hyperparathyroidism of renal origin: Secondary | ICD-10-CM | POA: Diagnosis not present

## 2022-08-18 DIAGNOSIS — Z992 Dependence on renal dialysis: Secondary | ICD-10-CM | POA: Diagnosis not present

## 2022-08-18 DIAGNOSIS — N2581 Secondary hyperparathyroidism of renal origin: Secondary | ICD-10-CM | POA: Diagnosis not present

## 2022-08-18 DIAGNOSIS — N186 End stage renal disease: Secondary | ICD-10-CM | POA: Diagnosis not present

## 2022-08-21 DIAGNOSIS — N186 End stage renal disease: Secondary | ICD-10-CM | POA: Diagnosis not present

## 2022-08-21 DIAGNOSIS — Z992 Dependence on renal dialysis: Secondary | ICD-10-CM | POA: Diagnosis not present

## 2022-08-21 DIAGNOSIS — N2581 Secondary hyperparathyroidism of renal origin: Secondary | ICD-10-CM | POA: Diagnosis not present

## 2022-08-23 DIAGNOSIS — Z992 Dependence on renal dialysis: Secondary | ICD-10-CM | POA: Diagnosis not present

## 2022-08-23 DIAGNOSIS — N2581 Secondary hyperparathyroidism of renal origin: Secondary | ICD-10-CM | POA: Diagnosis not present

## 2022-08-23 DIAGNOSIS — N186 End stage renal disease: Secondary | ICD-10-CM | POA: Diagnosis not present

## 2022-08-25 DIAGNOSIS — N2581 Secondary hyperparathyroidism of renal origin: Secondary | ICD-10-CM | POA: Diagnosis not present

## 2022-08-25 DIAGNOSIS — Z992 Dependence on renal dialysis: Secondary | ICD-10-CM | POA: Diagnosis not present

## 2022-08-25 DIAGNOSIS — N186 End stage renal disease: Secondary | ICD-10-CM | POA: Diagnosis not present

## 2022-08-28 DIAGNOSIS — N186 End stage renal disease: Secondary | ICD-10-CM | POA: Diagnosis not present

## 2022-08-28 DIAGNOSIS — Z992 Dependence on renal dialysis: Secondary | ICD-10-CM | POA: Diagnosis not present

## 2022-08-28 DIAGNOSIS — N2581 Secondary hyperparathyroidism of renal origin: Secondary | ICD-10-CM | POA: Diagnosis not present

## 2022-08-30 DIAGNOSIS — N186 End stage renal disease: Secondary | ICD-10-CM | POA: Diagnosis not present

## 2022-08-30 DIAGNOSIS — Z992 Dependence on renal dialysis: Secondary | ICD-10-CM | POA: Diagnosis not present

## 2022-08-30 DIAGNOSIS — N2581 Secondary hyperparathyroidism of renal origin: Secondary | ICD-10-CM | POA: Diagnosis not present

## 2022-09-01 DIAGNOSIS — Z992 Dependence on renal dialysis: Secondary | ICD-10-CM | POA: Diagnosis not present

## 2022-09-01 DIAGNOSIS — N186 End stage renal disease: Secondary | ICD-10-CM | POA: Diagnosis not present

## 2022-09-01 DIAGNOSIS — N2581 Secondary hyperparathyroidism of renal origin: Secondary | ICD-10-CM | POA: Diagnosis not present

## 2022-09-05 DIAGNOSIS — N2581 Secondary hyperparathyroidism of renal origin: Secondary | ICD-10-CM | POA: Diagnosis not present

## 2022-09-05 DIAGNOSIS — Z992 Dependence on renal dialysis: Secondary | ICD-10-CM | POA: Diagnosis not present

## 2022-09-05 DIAGNOSIS — N186 End stage renal disease: Secondary | ICD-10-CM | POA: Diagnosis not present

## 2022-09-06 DIAGNOSIS — Z992 Dependence on renal dialysis: Secondary | ICD-10-CM | POA: Diagnosis not present

## 2022-09-06 DIAGNOSIS — N2581 Secondary hyperparathyroidism of renal origin: Secondary | ICD-10-CM | POA: Diagnosis not present

## 2022-09-06 DIAGNOSIS — N186 End stage renal disease: Secondary | ICD-10-CM | POA: Diagnosis not present

## 2022-09-08 DIAGNOSIS — N186 End stage renal disease: Secondary | ICD-10-CM | POA: Diagnosis not present

## 2022-09-08 DIAGNOSIS — Z992 Dependence on renal dialysis: Secondary | ICD-10-CM | POA: Diagnosis not present

## 2022-09-08 DIAGNOSIS — N2581 Secondary hyperparathyroidism of renal origin: Secondary | ICD-10-CM | POA: Diagnosis not present

## 2022-09-11 DIAGNOSIS — Z992 Dependence on renal dialysis: Secondary | ICD-10-CM | POA: Diagnosis not present

## 2022-09-11 DIAGNOSIS — N186 End stage renal disease: Secondary | ICD-10-CM | POA: Diagnosis not present

## 2022-09-11 DIAGNOSIS — N2581 Secondary hyperparathyroidism of renal origin: Secondary | ICD-10-CM | POA: Diagnosis not present

## 2022-09-12 DIAGNOSIS — Z992 Dependence on renal dialysis: Secondary | ICD-10-CM | POA: Diagnosis not present

## 2022-09-12 DIAGNOSIS — N186 End stage renal disease: Secondary | ICD-10-CM | POA: Diagnosis not present

## 2022-09-13 DIAGNOSIS — N2581 Secondary hyperparathyroidism of renal origin: Secondary | ICD-10-CM | POA: Diagnosis not present

## 2022-09-13 DIAGNOSIS — Z992 Dependence on renal dialysis: Secondary | ICD-10-CM | POA: Diagnosis not present

## 2022-09-13 DIAGNOSIS — N186 End stage renal disease: Secondary | ICD-10-CM | POA: Diagnosis not present

## 2022-09-15 DIAGNOSIS — Z992 Dependence on renal dialysis: Secondary | ICD-10-CM | POA: Diagnosis not present

## 2022-09-15 DIAGNOSIS — N2581 Secondary hyperparathyroidism of renal origin: Secondary | ICD-10-CM | POA: Diagnosis not present

## 2022-09-15 DIAGNOSIS — N186 End stage renal disease: Secondary | ICD-10-CM | POA: Diagnosis not present

## 2022-09-18 DIAGNOSIS — N2581 Secondary hyperparathyroidism of renal origin: Secondary | ICD-10-CM | POA: Diagnosis not present

## 2022-09-18 DIAGNOSIS — N186 End stage renal disease: Secondary | ICD-10-CM | POA: Diagnosis not present

## 2022-09-18 DIAGNOSIS — Z992 Dependence on renal dialysis: Secondary | ICD-10-CM | POA: Diagnosis not present

## 2022-09-20 DIAGNOSIS — N2581 Secondary hyperparathyroidism of renal origin: Secondary | ICD-10-CM | POA: Diagnosis not present

## 2022-09-20 DIAGNOSIS — Z992 Dependence on renal dialysis: Secondary | ICD-10-CM | POA: Diagnosis not present

## 2022-09-20 DIAGNOSIS — N186 End stage renal disease: Secondary | ICD-10-CM | POA: Diagnosis not present

## 2022-09-25 DIAGNOSIS — N2581 Secondary hyperparathyroidism of renal origin: Secondary | ICD-10-CM | POA: Diagnosis not present

## 2022-09-25 DIAGNOSIS — N186 End stage renal disease: Secondary | ICD-10-CM | POA: Diagnosis not present

## 2022-09-25 DIAGNOSIS — Z992 Dependence on renal dialysis: Secondary | ICD-10-CM | POA: Diagnosis not present

## 2022-09-27 DIAGNOSIS — N186 End stage renal disease: Secondary | ICD-10-CM | POA: Diagnosis not present

## 2022-09-27 DIAGNOSIS — N2581 Secondary hyperparathyroidism of renal origin: Secondary | ICD-10-CM | POA: Diagnosis not present

## 2022-09-27 DIAGNOSIS — Z992 Dependence on renal dialysis: Secondary | ICD-10-CM | POA: Diagnosis not present

## 2022-09-29 DIAGNOSIS — N186 End stage renal disease: Secondary | ICD-10-CM | POA: Diagnosis not present

## 2022-09-29 DIAGNOSIS — Z992 Dependence on renal dialysis: Secondary | ICD-10-CM | POA: Diagnosis not present

## 2022-09-29 DIAGNOSIS — N2581 Secondary hyperparathyroidism of renal origin: Secondary | ICD-10-CM | POA: Diagnosis not present

## 2022-10-02 DIAGNOSIS — N186 End stage renal disease: Secondary | ICD-10-CM | POA: Diagnosis not present

## 2022-10-02 DIAGNOSIS — Z992 Dependence on renal dialysis: Secondary | ICD-10-CM | POA: Diagnosis not present

## 2022-10-02 DIAGNOSIS — N2581 Secondary hyperparathyroidism of renal origin: Secondary | ICD-10-CM | POA: Diagnosis not present

## 2022-10-04 DIAGNOSIS — N186 End stage renal disease: Secondary | ICD-10-CM | POA: Diagnosis not present

## 2022-10-04 DIAGNOSIS — Z992 Dependence on renal dialysis: Secondary | ICD-10-CM | POA: Diagnosis not present

## 2022-10-04 DIAGNOSIS — N2581 Secondary hyperparathyroidism of renal origin: Secondary | ICD-10-CM | POA: Diagnosis not present

## 2022-10-06 DIAGNOSIS — Z992 Dependence on renal dialysis: Secondary | ICD-10-CM | POA: Diagnosis not present

## 2022-10-06 DIAGNOSIS — N2581 Secondary hyperparathyroidism of renal origin: Secondary | ICD-10-CM | POA: Diagnosis not present

## 2022-10-06 DIAGNOSIS — N186 End stage renal disease: Secondary | ICD-10-CM | POA: Diagnosis not present

## 2022-10-10 DIAGNOSIS — N2581 Secondary hyperparathyroidism of renal origin: Secondary | ICD-10-CM | POA: Diagnosis not present

## 2022-10-10 DIAGNOSIS — Z992 Dependence on renal dialysis: Secondary | ICD-10-CM | POA: Diagnosis not present

## 2022-10-10 DIAGNOSIS — N186 End stage renal disease: Secondary | ICD-10-CM | POA: Diagnosis not present

## 2022-10-11 DIAGNOSIS — N186 End stage renal disease: Secondary | ICD-10-CM | POA: Diagnosis not present

## 2022-10-11 DIAGNOSIS — N2581 Secondary hyperparathyroidism of renal origin: Secondary | ICD-10-CM | POA: Diagnosis not present

## 2022-10-11 DIAGNOSIS — Z992 Dependence on renal dialysis: Secondary | ICD-10-CM | POA: Diagnosis not present

## 2022-10-13 DIAGNOSIS — Z992 Dependence on renal dialysis: Secondary | ICD-10-CM | POA: Diagnosis not present

## 2022-10-13 DIAGNOSIS — N2581 Secondary hyperparathyroidism of renal origin: Secondary | ICD-10-CM | POA: Diagnosis not present

## 2022-10-13 DIAGNOSIS — N186 End stage renal disease: Secondary | ICD-10-CM | POA: Diagnosis not present

## 2022-10-16 DIAGNOSIS — N186 End stage renal disease: Secondary | ICD-10-CM | POA: Diagnosis not present

## 2022-10-16 DIAGNOSIS — Z992 Dependence on renal dialysis: Secondary | ICD-10-CM | POA: Diagnosis not present

## 2022-10-16 DIAGNOSIS — N2581 Secondary hyperparathyroidism of renal origin: Secondary | ICD-10-CM | POA: Diagnosis not present

## 2022-10-18 DIAGNOSIS — N2581 Secondary hyperparathyroidism of renal origin: Secondary | ICD-10-CM | POA: Diagnosis not present

## 2022-10-18 DIAGNOSIS — N186 End stage renal disease: Secondary | ICD-10-CM | POA: Diagnosis not present

## 2022-10-18 DIAGNOSIS — Z992 Dependence on renal dialysis: Secondary | ICD-10-CM | POA: Diagnosis not present

## 2022-10-20 DIAGNOSIS — N2581 Secondary hyperparathyroidism of renal origin: Secondary | ICD-10-CM | POA: Diagnosis not present

## 2022-10-20 DIAGNOSIS — Z992 Dependence on renal dialysis: Secondary | ICD-10-CM | POA: Diagnosis not present

## 2022-10-20 DIAGNOSIS — N186 End stage renal disease: Secondary | ICD-10-CM | POA: Diagnosis not present

## 2022-10-23 DIAGNOSIS — Z992 Dependence on renal dialysis: Secondary | ICD-10-CM | POA: Diagnosis not present

## 2022-10-23 DIAGNOSIS — N186 End stage renal disease: Secondary | ICD-10-CM | POA: Diagnosis not present

## 2022-10-23 DIAGNOSIS — N2581 Secondary hyperparathyroidism of renal origin: Secondary | ICD-10-CM | POA: Diagnosis not present

## 2022-10-25 DIAGNOSIS — Z992 Dependence on renal dialysis: Secondary | ICD-10-CM | POA: Diagnosis not present

## 2022-10-25 DIAGNOSIS — N2581 Secondary hyperparathyroidism of renal origin: Secondary | ICD-10-CM | POA: Diagnosis not present

## 2022-10-25 DIAGNOSIS — N186 End stage renal disease: Secondary | ICD-10-CM | POA: Diagnosis not present

## 2022-10-27 DIAGNOSIS — N2581 Secondary hyperparathyroidism of renal origin: Secondary | ICD-10-CM | POA: Diagnosis not present

## 2022-10-27 DIAGNOSIS — Z992 Dependence on renal dialysis: Secondary | ICD-10-CM | POA: Diagnosis not present

## 2022-10-27 DIAGNOSIS — N186 End stage renal disease: Secondary | ICD-10-CM | POA: Diagnosis not present

## 2022-10-30 DIAGNOSIS — N2581 Secondary hyperparathyroidism of renal origin: Secondary | ICD-10-CM | POA: Diagnosis not present

## 2022-10-30 DIAGNOSIS — N186 End stage renal disease: Secondary | ICD-10-CM | POA: Diagnosis not present

## 2022-10-30 DIAGNOSIS — Z992 Dependence on renal dialysis: Secondary | ICD-10-CM | POA: Diagnosis not present

## 2022-11-01 DIAGNOSIS — Z992 Dependence on renal dialysis: Secondary | ICD-10-CM | POA: Diagnosis not present

## 2022-11-01 DIAGNOSIS — N186 End stage renal disease: Secondary | ICD-10-CM | POA: Diagnosis not present

## 2022-11-01 DIAGNOSIS — N2581 Secondary hyperparathyroidism of renal origin: Secondary | ICD-10-CM | POA: Diagnosis not present

## 2022-11-03 DIAGNOSIS — N186 End stage renal disease: Secondary | ICD-10-CM | POA: Diagnosis not present

## 2022-11-03 DIAGNOSIS — N2581 Secondary hyperparathyroidism of renal origin: Secondary | ICD-10-CM | POA: Diagnosis not present

## 2022-11-03 DIAGNOSIS — Z992 Dependence on renal dialysis: Secondary | ICD-10-CM | POA: Diagnosis not present

## 2022-11-06 DIAGNOSIS — N2581 Secondary hyperparathyroidism of renal origin: Secondary | ICD-10-CM | POA: Diagnosis not present

## 2022-11-06 DIAGNOSIS — Z992 Dependence on renal dialysis: Secondary | ICD-10-CM | POA: Diagnosis not present

## 2022-11-06 DIAGNOSIS — N186 End stage renal disease: Secondary | ICD-10-CM | POA: Diagnosis not present

## 2022-11-10 DIAGNOSIS — N186 End stage renal disease: Secondary | ICD-10-CM | POA: Diagnosis not present

## 2022-11-10 DIAGNOSIS — N2581 Secondary hyperparathyroidism of renal origin: Secondary | ICD-10-CM | POA: Diagnosis not present

## 2022-11-10 DIAGNOSIS — Z992 Dependence on renal dialysis: Secondary | ICD-10-CM | POA: Diagnosis not present

## 2022-11-12 DIAGNOSIS — Z992 Dependence on renal dialysis: Secondary | ICD-10-CM | POA: Diagnosis not present

## 2022-11-12 DIAGNOSIS — N186 End stage renal disease: Secondary | ICD-10-CM | POA: Diagnosis not present

## 2022-11-13 DIAGNOSIS — N186 End stage renal disease: Secondary | ICD-10-CM | POA: Diagnosis not present

## 2022-11-13 DIAGNOSIS — Z992 Dependence on renal dialysis: Secondary | ICD-10-CM | POA: Diagnosis not present

## 2022-11-13 DIAGNOSIS — N2581 Secondary hyperparathyroidism of renal origin: Secondary | ICD-10-CM | POA: Diagnosis not present

## 2022-11-15 DIAGNOSIS — N186 End stage renal disease: Secondary | ICD-10-CM | POA: Diagnosis not present

## 2022-11-15 DIAGNOSIS — Z992 Dependence on renal dialysis: Secondary | ICD-10-CM | POA: Diagnosis not present

## 2022-11-15 DIAGNOSIS — N2581 Secondary hyperparathyroidism of renal origin: Secondary | ICD-10-CM | POA: Diagnosis not present

## 2022-11-17 DIAGNOSIS — N186 End stage renal disease: Secondary | ICD-10-CM | POA: Diagnosis not present

## 2022-11-17 DIAGNOSIS — Z992 Dependence on renal dialysis: Secondary | ICD-10-CM | POA: Diagnosis not present

## 2022-11-17 DIAGNOSIS — N2581 Secondary hyperparathyroidism of renal origin: Secondary | ICD-10-CM | POA: Diagnosis not present

## 2022-11-20 DIAGNOSIS — N2581 Secondary hyperparathyroidism of renal origin: Secondary | ICD-10-CM | POA: Diagnosis not present

## 2022-11-20 DIAGNOSIS — Z992 Dependence on renal dialysis: Secondary | ICD-10-CM | POA: Diagnosis not present

## 2022-11-20 DIAGNOSIS — N186 End stage renal disease: Secondary | ICD-10-CM | POA: Diagnosis not present

## 2022-11-21 ENCOUNTER — Inpatient Hospital Stay: Payer: Medicare HMO | Attending: Oncology

## 2022-11-21 ENCOUNTER — Inpatient Hospital Stay: Payer: Medicare HMO | Admitting: Oncology

## 2022-11-22 DIAGNOSIS — Z992 Dependence on renal dialysis: Secondary | ICD-10-CM | POA: Diagnosis not present

## 2022-11-22 DIAGNOSIS — N2581 Secondary hyperparathyroidism of renal origin: Secondary | ICD-10-CM | POA: Diagnosis not present

## 2022-11-22 DIAGNOSIS — N186 End stage renal disease: Secondary | ICD-10-CM | POA: Diagnosis not present

## 2022-11-24 DIAGNOSIS — N2581 Secondary hyperparathyroidism of renal origin: Secondary | ICD-10-CM | POA: Diagnosis not present

## 2022-11-24 DIAGNOSIS — N186 End stage renal disease: Secondary | ICD-10-CM | POA: Diagnosis not present

## 2022-11-24 DIAGNOSIS — Z992 Dependence on renal dialysis: Secondary | ICD-10-CM | POA: Diagnosis not present

## 2022-11-29 DIAGNOSIS — Z992 Dependence on renal dialysis: Secondary | ICD-10-CM | POA: Diagnosis not present

## 2022-11-29 DIAGNOSIS — N186 End stage renal disease: Secondary | ICD-10-CM | POA: Diagnosis not present

## 2022-11-29 DIAGNOSIS — N2581 Secondary hyperparathyroidism of renal origin: Secondary | ICD-10-CM | POA: Diagnosis not present

## 2022-12-01 DIAGNOSIS — N186 End stage renal disease: Secondary | ICD-10-CM | POA: Diagnosis not present

## 2022-12-01 DIAGNOSIS — N2581 Secondary hyperparathyroidism of renal origin: Secondary | ICD-10-CM | POA: Diagnosis not present

## 2022-12-01 DIAGNOSIS — Z992 Dependence on renal dialysis: Secondary | ICD-10-CM | POA: Diagnosis not present

## 2022-12-04 DIAGNOSIS — Z992 Dependence on renal dialysis: Secondary | ICD-10-CM | POA: Diagnosis not present

## 2022-12-04 DIAGNOSIS — N186 End stage renal disease: Secondary | ICD-10-CM | POA: Diagnosis not present

## 2022-12-04 DIAGNOSIS — N2581 Secondary hyperparathyroidism of renal origin: Secondary | ICD-10-CM | POA: Diagnosis not present

## 2022-12-06 DIAGNOSIS — N186 End stage renal disease: Secondary | ICD-10-CM | POA: Diagnosis not present

## 2022-12-06 DIAGNOSIS — Z992 Dependence on renal dialysis: Secondary | ICD-10-CM | POA: Diagnosis not present

## 2022-12-06 DIAGNOSIS — N2581 Secondary hyperparathyroidism of renal origin: Secondary | ICD-10-CM | POA: Diagnosis not present

## 2022-12-08 DIAGNOSIS — N186 End stage renal disease: Secondary | ICD-10-CM | POA: Diagnosis not present

## 2022-12-08 DIAGNOSIS — N2581 Secondary hyperparathyroidism of renal origin: Secondary | ICD-10-CM | POA: Diagnosis not present

## 2022-12-08 DIAGNOSIS — Z992 Dependence on renal dialysis: Secondary | ICD-10-CM | POA: Diagnosis not present

## 2022-12-11 DIAGNOSIS — Z992 Dependence on renal dialysis: Secondary | ICD-10-CM | POA: Diagnosis not present

## 2022-12-11 DIAGNOSIS — N2581 Secondary hyperparathyroidism of renal origin: Secondary | ICD-10-CM | POA: Diagnosis not present

## 2022-12-11 DIAGNOSIS — N186 End stage renal disease: Secondary | ICD-10-CM | POA: Diagnosis not present

## 2022-12-13 DIAGNOSIS — N186 End stage renal disease: Secondary | ICD-10-CM | POA: Diagnosis not present

## 2022-12-13 DIAGNOSIS — Z992 Dependence on renal dialysis: Secondary | ICD-10-CM | POA: Diagnosis not present

## 2022-12-13 DIAGNOSIS — N2581 Secondary hyperparathyroidism of renal origin: Secondary | ICD-10-CM | POA: Diagnosis not present

## 2022-12-15 DIAGNOSIS — N2581 Secondary hyperparathyroidism of renal origin: Secondary | ICD-10-CM | POA: Diagnosis not present

## 2022-12-15 DIAGNOSIS — N186 End stage renal disease: Secondary | ICD-10-CM | POA: Diagnosis not present

## 2022-12-15 DIAGNOSIS — Z992 Dependence on renal dialysis: Secondary | ICD-10-CM | POA: Diagnosis not present

## 2022-12-18 DIAGNOSIS — N186 End stage renal disease: Secondary | ICD-10-CM | POA: Diagnosis not present

## 2022-12-18 DIAGNOSIS — Z992 Dependence on renal dialysis: Secondary | ICD-10-CM | POA: Diagnosis not present

## 2022-12-18 DIAGNOSIS — N2581 Secondary hyperparathyroidism of renal origin: Secondary | ICD-10-CM | POA: Diagnosis not present

## 2022-12-20 DIAGNOSIS — Z992 Dependence on renal dialysis: Secondary | ICD-10-CM | POA: Diagnosis not present

## 2022-12-20 DIAGNOSIS — N2581 Secondary hyperparathyroidism of renal origin: Secondary | ICD-10-CM | POA: Diagnosis not present

## 2022-12-20 DIAGNOSIS — N186 End stage renal disease: Secondary | ICD-10-CM | POA: Diagnosis not present

## 2022-12-22 DIAGNOSIS — N2581 Secondary hyperparathyroidism of renal origin: Secondary | ICD-10-CM | POA: Diagnosis not present

## 2022-12-22 DIAGNOSIS — Z992 Dependence on renal dialysis: Secondary | ICD-10-CM | POA: Diagnosis not present

## 2022-12-22 DIAGNOSIS — N186 End stage renal disease: Secondary | ICD-10-CM | POA: Diagnosis not present

## 2022-12-25 DIAGNOSIS — N2581 Secondary hyperparathyroidism of renal origin: Secondary | ICD-10-CM | POA: Diagnosis not present

## 2022-12-25 DIAGNOSIS — N186 End stage renal disease: Secondary | ICD-10-CM | POA: Diagnosis not present

## 2022-12-25 DIAGNOSIS — Z992 Dependence on renal dialysis: Secondary | ICD-10-CM | POA: Diagnosis not present

## 2022-12-27 DIAGNOSIS — Z992 Dependence on renal dialysis: Secondary | ICD-10-CM | POA: Diagnosis not present

## 2022-12-27 DIAGNOSIS — N2581 Secondary hyperparathyroidism of renal origin: Secondary | ICD-10-CM | POA: Diagnosis not present

## 2022-12-27 DIAGNOSIS — N186 End stage renal disease: Secondary | ICD-10-CM | POA: Diagnosis not present

## 2022-12-29 DIAGNOSIS — N2581 Secondary hyperparathyroidism of renal origin: Secondary | ICD-10-CM | POA: Diagnosis not present

## 2022-12-29 DIAGNOSIS — N186 End stage renal disease: Secondary | ICD-10-CM | POA: Diagnosis not present

## 2022-12-29 DIAGNOSIS — Z992 Dependence on renal dialysis: Secondary | ICD-10-CM | POA: Diagnosis not present

## 2023-01-03 DIAGNOSIS — N2581 Secondary hyperparathyroidism of renal origin: Secondary | ICD-10-CM | POA: Diagnosis not present

## 2023-01-03 DIAGNOSIS — N186 End stage renal disease: Secondary | ICD-10-CM | POA: Diagnosis not present

## 2023-01-03 DIAGNOSIS — Z992 Dependence on renal dialysis: Secondary | ICD-10-CM | POA: Diagnosis not present

## 2023-01-05 DIAGNOSIS — N186 End stage renal disease: Secondary | ICD-10-CM | POA: Diagnosis not present

## 2023-01-05 DIAGNOSIS — N2581 Secondary hyperparathyroidism of renal origin: Secondary | ICD-10-CM | POA: Diagnosis not present

## 2023-01-05 DIAGNOSIS — Z992 Dependence on renal dialysis: Secondary | ICD-10-CM | POA: Diagnosis not present

## 2023-01-08 DIAGNOSIS — N2581 Secondary hyperparathyroidism of renal origin: Secondary | ICD-10-CM | POA: Diagnosis not present

## 2023-01-08 DIAGNOSIS — Z992 Dependence on renal dialysis: Secondary | ICD-10-CM | POA: Diagnosis not present

## 2023-01-08 DIAGNOSIS — N186 End stage renal disease: Secondary | ICD-10-CM | POA: Diagnosis not present

## 2023-01-10 DIAGNOSIS — Z992 Dependence on renal dialysis: Secondary | ICD-10-CM | POA: Diagnosis not present

## 2023-01-10 DIAGNOSIS — N186 End stage renal disease: Secondary | ICD-10-CM | POA: Diagnosis not present

## 2023-01-10 DIAGNOSIS — N2581 Secondary hyperparathyroidism of renal origin: Secondary | ICD-10-CM | POA: Diagnosis not present

## 2023-01-12 DIAGNOSIS — N2581 Secondary hyperparathyroidism of renal origin: Secondary | ICD-10-CM | POA: Diagnosis not present

## 2023-01-12 DIAGNOSIS — Z992 Dependence on renal dialysis: Secondary | ICD-10-CM | POA: Diagnosis not present

## 2023-01-12 DIAGNOSIS — N186 End stage renal disease: Secondary | ICD-10-CM | POA: Diagnosis not present

## 2023-01-15 DIAGNOSIS — N186 End stage renal disease: Secondary | ICD-10-CM | POA: Diagnosis not present

## 2023-01-15 DIAGNOSIS — Z992 Dependence on renal dialysis: Secondary | ICD-10-CM | POA: Diagnosis not present

## 2023-01-19 ENCOUNTER — Emergency Department: Payer: Medicare HMO

## 2023-01-19 ENCOUNTER — Other Ambulatory Visit: Payer: Self-pay

## 2023-01-19 ENCOUNTER — Emergency Department
Admission: EM | Admit: 2023-01-19 | Discharge: 2023-01-19 | Disposition: A | Discharge status: Home / Self Care | Payer: Medicare HMO | Attending: Emergency Medicine | Admitting: Emergency Medicine

## 2023-01-19 ENCOUNTER — Encounter: Payer: Self-pay | Admitting: Emergency Medicine

## 2023-01-19 DIAGNOSIS — Z992 Dependence on renal dialysis: Secondary | ICD-10-CM | POA: Insufficient documentation

## 2023-01-19 DIAGNOSIS — R0902 Hypoxemia: Secondary | ICD-10-CM | POA: Diagnosis not present

## 2023-01-19 DIAGNOSIS — R0602 Shortness of breath: Secondary | ICD-10-CM | POA: Diagnosis not present

## 2023-01-19 DIAGNOSIS — J90 Pleural effusion, not elsewhere classified: Secondary | ICD-10-CM | POA: Diagnosis not present

## 2023-01-19 DIAGNOSIS — Z7401 Bed confinement status: Secondary | ICD-10-CM | POA: Diagnosis not present

## 2023-01-19 DIAGNOSIS — R0689 Other abnormalities of breathing: Secondary | ICD-10-CM | POA: Diagnosis not present

## 2023-01-19 DIAGNOSIS — D631 Anemia in chronic kidney disease: Secondary | ICD-10-CM | POA: Diagnosis not present

## 2023-01-19 DIAGNOSIS — N186 End stage renal disease: Secondary | ICD-10-CM | POA: Diagnosis not present

## 2023-01-19 DIAGNOSIS — I517 Cardiomegaly: Secondary | ICD-10-CM | POA: Diagnosis not present

## 2023-01-19 DIAGNOSIS — J9600 Acute respiratory failure, unspecified whether with hypoxia or hypercapnia: Secondary | ICD-10-CM | POA: Diagnosis not present

## 2023-01-19 DIAGNOSIS — R29898 Other symptoms and signs involving the musculoskeletal system: Secondary | ICD-10-CM | POA: Diagnosis not present

## 2023-01-19 DIAGNOSIS — I120 Hypertensive chronic kidney disease with stage 5 chronic kidney disease or end stage renal disease: Secondary | ICD-10-CM | POA: Diagnosis not present

## 2023-01-19 DIAGNOSIS — I509 Heart failure, unspecified: Secondary | ICD-10-CM | POA: Diagnosis not present

## 2023-01-19 DIAGNOSIS — J9601 Acute respiratory failure with hypoxia: Secondary | ICD-10-CM | POA: Diagnosis not present

## 2023-01-19 DIAGNOSIS — N2581 Secondary hyperparathyroidism of renal origin: Secondary | ICD-10-CM | POA: Diagnosis not present

## 2023-01-19 DIAGNOSIS — I10 Essential (primary) hypertension: Secondary | ICD-10-CM | POA: Diagnosis not present

## 2023-01-19 DIAGNOSIS — R0989 Other specified symptoms and signs involving the circulatory and respiratory systems: Secondary | ICD-10-CM | POA: Diagnosis not present

## 2023-01-19 LAB — CBC
HCT: 32.5 % — ABNORMAL LOW (ref 36.0–46.0)
Hemoglobin: 9.8 g/dL — ABNORMAL LOW (ref 12.0–15.0)
MCH: 30.7 pg (ref 26.0–34.0)
MCHC: 30.2 g/dL (ref 30.0–36.0)
MCV: 101.9 fL — ABNORMAL HIGH (ref 80.0–100.0)
Platelets: 104 10*3/uL — ABNORMAL LOW (ref 150–400)
RBC: 3.19 MIL/uL — ABNORMAL LOW (ref 3.87–5.11)
RDW: 17.5 % — ABNORMAL HIGH (ref 11.5–15.5)
WBC: 9.3 10*3/uL (ref 4.0–10.5)
nRBC: 0 % (ref 0.0–0.2)

## 2023-01-19 LAB — COMPREHENSIVE METABOLIC PANEL
ALT: 25 U/L (ref 0–44)
AST: 27 U/L (ref 15–41)
Albumin: 3.3 g/dL — ABNORMAL LOW (ref 3.5–5.0)
Alkaline Phosphatase: 78 U/L (ref 38–126)
Anion gap: 11 (ref 5–15)
BUN: 71 mg/dL — ABNORMAL HIGH (ref 8–23)
CO2: 26 mmol/L (ref 22–32)
Calcium: 7.7 mg/dL — ABNORMAL LOW (ref 8.9–10.3)
Chloride: 106 mmol/L (ref 98–111)
Creatinine, Ser: 7.11 mg/dL — ABNORMAL HIGH (ref 0.44–1.00)
GFR, Estimated: 6 mL/min — ABNORMAL LOW (ref >60)
Glucose, Bld: 106 mg/dL — ABNORMAL HIGH (ref 70–99)
Potassium: 4.1 mmol/L (ref 3.5–5.1)
Sodium: 143 mmol/L (ref 135–145)
Total Bilirubin: 1 mg/dL (ref <1.2)
Total Protein: 6.9 g/dL (ref 6.5–8.1)

## 2023-01-19 LAB — TROPONIN I (HIGH SENSITIVITY)
Troponin I (High Sensitivity): 29 ng/L — ABNORMAL HIGH (ref <18)
Troponin I (High Sensitivity): 28 ng/L — ABNORMAL HIGH (ref <18)

## 2023-01-19 MED ORDER — LIDOCAINE HCL (PF) 1 % IJ SOLN: 5.0000 mL | INTRAMUSCULAR | Status: DC | PRN

## 2023-01-19 MED ORDER — PENTAFLUOROPROP-TETRAFLUOROETH EX AERO
1.0000 | INHALATION_SPRAY | CUTANEOUS | Status: DC | PRN
  Administered 2023-01-19: 1 via TOPICAL
  Filled 2023-01-19: qty 30

## 2023-01-19 MED ORDER — PENTAFLUOROPROP-TETRAFLUOROETH EX AERO
INHALATION_SPRAY | CUTANEOUS | Status: AC
  Filled 2023-01-19: qty 30

## 2023-01-19 MED ORDER — HEPARIN SODIUM (PORCINE) 1000 UNIT/ML DIALYSIS: 1000.0000 [IU] | INTRAMUSCULAR | Status: DC | PRN

## 2023-01-19 MED ORDER — ANTICOAGULANT SODIUM CITRATE 4% (200MG/5ML) IV SOLN: 5.0000 mL | Status: DC | PRN

## 2023-01-19 MED ORDER — ALTEPLASE 2 MG IJ SOLR: 2.0000 mg | Freq: Once | INTRAMUSCULAR | Status: DC | PRN

## 2023-01-19 MED ORDER — LIDOCAINE-PRILOCAINE 2.5-2.5 % EX CREA: 1.0000 | TOPICAL_CREAM | CUTANEOUS | Status: DC | PRN

## 2023-01-19 MED ORDER — NEPRO/CARBSTEADY PO LIQD: 237.0000 mL | ORAL | Status: DC | PRN

## 2023-01-19 NOTE — ED Notes (Signed)
Pt has lunch tray now. Waiting for dialysis after lunch then possible discharge.

## 2023-01-19 NOTE — ED Notes (Signed)
Pt tid not receive breakfast tray although Nash Dimmer, RN called dietary 3 times. Pt complained that she was "very frustrated" about not getting meal tray. Pt was provided sandwich tray by Jerilee Field, RN.

## 2023-01-19 NOTE — ED Provider Notes (Signed)
  Physical Exam  BP (!) 144/81   Pulse 78   Temp (!) 97.5 F (36.4 C)   Resp 16   Wt 75.1 kg   SpO2 94%   BMI 28.42 kg/m   Physical Exam  Procedures  Procedures  ED Course / MDM   Clinical Course as of 01/19/23 1041  Sat Jan 19, 2023  0904 Nephrology - Can get her dialyzed after lunch [DW]    Clinical Course User Index [DW] Janith Lima, MD   Medical Decision Making Amount and/or Complexity of Data Reviewed Labs: ordered. Radiology: ordered.   Received patient in signout.  72 year old female presenting today for shortness of breath after missed dialysis session.  Evaluated by previous provider and appears to be volume overloaded secondary to missed dialysis likely causing her hypoxia today.  Patient signed out pending discussion with nephrology for dialysis.  Spoke with nephrology.  Plan for dialysis after lunch today when they can fit her into schedule.  Patient taken to dialysis.  Patient signed out at end of shift pending reassessment when she gets back from dialysis to see if nephrology feels comfortable with discharging her or she will need to be admitted for repeat dialysis.     Janith Lima, MD 01/19/23 [phone]

## 2023-01-19 NOTE — Progress Notes (Signed)
Received patient in bed to unit.    Informed consent signed and in chart.    TX duration: 2hrs 45 mins     Transported back to floor  Hand-off given to patient's nurse.   Access used:  lft arm avf Access issues: n/a  Total UF removed:  2200 mls      Maple Hudson, RN Dialysis Unit

## 2023-01-19 NOTE — ED Notes (Signed)
Pt to ED via EMS from home,

## 2023-01-19 NOTE — Progress Notes (Signed)
[facility]  ROUNDING NOTE   Subjective:  72 y.o. female presents today for shortness of breath after missed dialysis session. Noted hypoxia due to volume overload. Patient reports home oxygen use of 3.5-4L via [place] is baseline, currently seen on 6L.  Last treatment Tuesday 12/3  [facility] [place] scheduled for today   Objective:  Vital signs in last 24 hours:  Temp:  [97.5 F (36.4 C)-98.5 F (36.9 C)] 98.2 F (36.8 C) (12/07 1343) Pulse Rate:  [66-105] 84 (12/07 1642) Resp:  [16-36] 36 (12/07 1530) BP: (135-188)/(66-95) 169/81 (12/07 1642) SpO2:  [87 %-100 %] 95 % (12/07 1642) Weight:  [75.1 kg] 75.1 kg (12/07 0154)  Weight change:  Filed Weights   01/19/23 0154  Weight: 75.1 kg    Intake/Output: No intake/output data recorded.   Intake/Output this shift:  No intake/output data recorded.  Physical Exam: General: NAD,   Head: Normocephalic, atraumatic. Moist oral mucosal membranes  Eyes: Anicteric, PERRL  Neck: Supple, trachea midline  Lungs:  Rales to auscultation  Heart: Regular rate and rhythm  Abdomen:  Soft, nontender,   Extremities:  +1/+2 peripheral edema, facial noted  Neurologic: Nonfocal, moving all four extremities  Skin: No lesions  Access: Left AVF    Basic Metabolic Panel: Recent Labs  Lab 01/19/23 0157  NA 143  K 4.1  CL 106  CO2 26  GLUCOSE 106*  BUN 71*  CREATININE 7.11*  CALCIUM 7.7*    Liver Function Tests: Recent Labs  Lab 01/19/23 0157  AST 27  ALT 25  ALKPHOS 78  BILITOT 1.0  PROT 6.9  ALBUMIN 3.3*   No results for input(s): "LIPASE", "AMYLASE" in the last 168 hours. No results for input(s): "AMMONIA" in the last 168 hours.  CBC: Recent Labs  Lab 01/19/23 0157  WBC 9.3  HGB 9.8*  HCT 32.5*  MCV 101.9*  PLT 104*    Cardiac Enzymes: No results for input(s): "CKTOTAL", "CKMB", "CKMBINDEX", "TROPONINI" in the last 168 hours.  BNP: Invalid input(s):  "POCBNP"  CBG: No results for input(s): "GLUCAP" in the last 168 hours.  Microbiology: Results for orders placed or performed during the hospital encounter of 09/18/20  Resp Panel by RT-PCR (Flu A&B, Covid) Nasopharyngeal Swab     Status: None   Collection Time: 09/18/20 12:19 PM   Specimen: Nasopharyngeal Swab; Nasopharyngeal(NP) swabs in vial transport medium  Result Value Ref Range Status   SARS Coronavirus 2 by RT PCR NEGATIVE NEGATIVE Final    Comment: (NOTE) SARS-CoV-2 target nucleic acids are NOT DETECTED.  The SARS-CoV-2 RNA is generally detectable in upper respiratory specimens during the acute phase of infection. The lowest concentration of SARS-CoV-2 viral copies this assay can detect is 138 copies/mL. A negative result does not preclude SARS-Cov-2 infection and should not be used as the sole basis for treatment or other patient management decisions. A negative result may occur with  improper specimen collection/handling, submission of specimen other than nasopharyngeal swab, presence of viral mutation(s) within the areas targeted by this assay, and inadequate number of viral copies(<138 copies/mL). A negative result must be combined with clinical observations, patient history, and epidemiological information. The expected result is Negative.  Fact Sheet for Patients:  BloggerCourse.com  Fact Sheet for Healthcare Providers:  SeriousBroker.it  This test is no t yet approved or cleared by the [place] FDA and  has been authorized for detection and/or diagnosis of SARS-CoV-2 by FDA under an Emergency Use Authorization (EUA).  This EUA will remain  in effect (meaning this test can be used) for the duration of the COVID-19 declaration under Section 564(b)(1) of the Act, 21 U.S.C.section 360bbb-3(b)(1), unless the authorization is terminated  or revoked sooner.       Influenza A by PCR NEGATIVE NEGATIVE Final    Influenza B by PCR NEGATIVE NEGATIVE Final    Comment: (NOTE) The Xpert Xpress SARS-CoV-2/FLU/RSV plus assay is intended as an aid in the diagnosis of influenza from Nasopharyngeal swab specimens and should not be used as a sole basis for treatment. Nasal washings and aspirates are unacceptable for Xpert Xpress SARS-CoV-2/FLU/RSV testing.  Fact Sheet for Patients: BloggerCourse.com  Fact Sheet for Healthcare Providers: SeriousBroker.it  This test is not yet approved or cleared by the [place] FDA and has been authorized for detection and/or diagnosis of SARS-CoV-2 by FDA under an Emergency Use Authorization (EUA). This EUA will remain in effect (meaning this test can be used) for the duration of the COVID-19 declaration under Section 564(b)(1) of the Act, 21 U.S.C. section 360bbb-3(b)(1), unless the authorization is terminated or revoked.  Performed at [facility], [address] [place], [place] [address]   MRSA Next Gen by PCR, Nasal     Status: None   Collection Time: 09/20/20  9:32 PM   Specimen: Nasal Mucosa; Nasal Swab  Result Value Ref Range Status   MRSA by PCR Next Gen NOT DETECTED NOT DETECTED Final    Comment: (NOTE) The GeneXpert MRSA Assay (FDA approved for NASAL specimens only), is one component of a comprehensive MRSA colonization surveillance program. It is not intended to diagnose MRSA infection nor to guide or monitor treatment for MRSA infections. Test performance is not FDA approved in patients less than 70 years old. Performed at [facility], [address] [place], [place] [address]     Coagulation Studies: No results for input(s): "LABPROT", "INR" in the last 72 hours.  Urinalysis: No results for input(s): "COLORURINE", "LABSPEC", "PHURINE", "GLUCOSEU", "HGBUR", "BILIRUBINUR", "KETONESUR", "PROTEINUR", "UROBILINOGEN", "NITRITE", "LEUKOCYTESUR" in the last 72  hours.  Invalid input(s): "APPERANCEUR"    Imaging: DG Chest 2 View  Result Date: 01/19/2023 CLINICAL DATA:  Shortness of breath. EXAM: CHEST - 2 VIEW COMPARISON:  09/18/2020 FINDINGS: Moderate cardiomegaly with bibasilar atelectasis. Small pleural effusions. No focal airspace consolidation. Central pulmonary vascular congestion. IMPRESSION: Moderate cardiomegaly with central pulmonary vascular congestion and small pleural effusions. Electronically Signed   By: Deatra Robinson M.D.   On: 01/19/2023 02:36     Medications:    anticoagulant sodium citrate      alteplase, anticoagulant sodium citrate, feeding supplement (NEPRO CARB STEADY), heparin, lidocaine (PF), lidocaine-prilocaine, pentafluoroprop-tetrafluoroeth  Assessment/ Plan:  Ms. Anna Key is a 72 y.o.  female presents today for shortness of breath after missed dialysis session. Noted hypoxia due to volume overload. Patient reports home oxygen use of 3.5-4L via [place] is baseline, currently seen on 6L.  Last treatment Tuesday 12/3  #ESRD #Volume overload with dependent edema Received dialysis at [facility] facility. Missed Thursday dialysis. Last known session Tuesday 12/3 Dialysis scheduled for today  #Acute respiratory failure with hypoxia Baseline home oxygen 3.5-4L via . Patient increased oxygen demand to 6L currently.  #Anemia of CKD HgB 10.9 Hemoglobin acceptable for patient. No need for ESA at this time.  #Hypocalcemia Ca 7.7   LOS: 0 Anna Key P Niara Bunker 12/7/20244:57 PM

## 2023-01-19 NOTE — ED Triage Notes (Signed)
Patient reports shortness of breath x 3 days. Patient missed her Thursday dialysis appointment.  Patient placed on 4 L [place] by EMS d/t hypoxia on scene. Patient denies pain.

## 2023-01-19 NOTE — ED Provider Notes (Signed)
[facility] Provider Note    Event Date/Time   First MD Initiated Contact with Patient 01/19/23 [phone]     (approximate)   History   Shortness of Breath   HPI  Anna Key is a 72 y.o. female who presents to the ED for evaluation of Shortness of Breath   TThS iHD patient with left arm AVF presents with progressive shortness of breath over the past few days after missing dialysis this past Thursday.  No fevers, cough, syncope.  Does report some chest pressure and dyspnea.  She has oxygen at home but does not often use it.  She is requiring 4 L nasal cannula here  Physical Exam   Triage Vital Signs: ED Triage Vitals  Encounter Vitals Group     BP 01/19/23 0153 (!) 156/95     Systolic BP Percentile --      Diastolic BP Percentile --      Pulse Rate 01/19/23 0153 83     Resp 01/19/23 0153 20     Temp 01/19/23 0151 98.5 F (36.9 C)     Temp Source 01/19/23 0151 Oral     SpO2 01/19/23 0153 97 %     Weight 01/19/23 0154 165 lb 9.1 oz (75.1 kg)     Height --      Head Circumference --      Peak Flow --      Pain Score 01/19/23 0154 0     Pain Loc --      Pain Education --      Exclude from Growth Chart --     Most recent vital signs: Vitals:   01/19/23 0630 01/19/23 0700  BP: (!) 146/71 135/70  Pulse:    Resp:    Temp:    SpO2:      General: Awake, no distress.  CV:  Good peripheral perfusion.  Resp:  Minimal tachypnea.  No distress Abd:  No distention.  MSK:  No deformity noted.  Neuro:  No focal deficits appreciated. Other:     ED Results / Procedures / Treatments   Labs (all labs ordered are listed, but only abnormal results are displayed) Labs Reviewed  CBC - Abnormal; Notable for the following components:      Result Value   RBC 3.19 (*)    Hemoglobin 9.8 (*)    HCT 32.5 (*)    MCV 101.9 (*)    RDW 17.5 (*)    Platelets 104 (*)    All other components within normal limits  COMPREHENSIVE METABOLIC PANEL -  Abnormal; Notable for the following components:   Glucose, Bld 106 (*)    BUN 71 (*)    Creatinine, Ser 7.11 (*)    Calcium 7.7 (*)    Albumin 3.3 (*)    GFR, Estimated 6 (*)    All other components within normal limits  TROPONIN I (HIGH SENSITIVITY) - Abnormal; Notable for the following components:   Troponin I (High Sensitivity) 29 (*)    All other components within normal limits  TROPONIN I (HIGH SENSITIVITY) - Abnormal; Notable for the following components:   Troponin I (High Sensitivity) 28 (*)    All other components within normal limits    EKG Sinus rhythm with a rate of 71 bpm.  Normal axis and intervals.  Nonspecific ST changes without STEMI.  Lateral T wave inversions  RADIOLOGY 2 PCXR interpreted by me with cardiomegaly and pulmonary vascular congestion  Official radiology report(s): DG  Chest 2 View  Result Date: 01/19/2023 CLINICAL DATA:  Shortness of breath. EXAM: CHEST - 2 VIEW COMPARISON:  09/18/2020 FINDINGS: Moderate cardiomegaly with bibasilar atelectasis. Small pleural effusions. No focal airspace consolidation. Central pulmonary vascular congestion. IMPRESSION: Moderate cardiomegaly with central pulmonary vascular congestion and small pleural effusions. Electronically Signed   By: Deatra Robinson M.D.   On: 01/19/2023 02:36    PROCEDURES and INTERVENTIONS:  .Critical Care  Performed by: Delton Prairie, MD Authorized by: Delton Prairie, MD   Critical care provider statement:    Critical care time (minutes):  30   Critical care time was exclusive of:  Separately billable procedures and treating other patients   Critical care was necessary to treat or prevent imminent or life-threatening deterioration of the following conditions:  Respiratory failure   Critical care was time spent personally by me on the following activities:  Development of treatment plan with patient or surrogate, discussions with consultants, evaluation of patient's response to treatment,  examination of patient, ordering and review of laboratory studies, ordering and review of radiographic studies, ordering and performing treatments and interventions, pulse oximetry, re-evaluation of patient's condition and review of old charts .1-3 Lead EKG Interpretation  Performed by: Delton Prairie, MD Authorized by: Delton Prairie, MD     Interpretation: normal     ECG rate:  80   ECG rate assessment: normal     Rhythm: sinus rhythm     Ectopy: none     Conduction: normal     Medications - No data to display   IMPRESSION / MDM / ASSESSMENT AND PLAN / ED COURSE  I reviewed the triage vital signs and the nursing notes.  Differential diagnosis includes, but is not limited to, ACS, PTX, PNA, muscle strain/spasm, PE, dissection, anxiety, pleural effusion  {Patient presents with symptoms of an acute illness or injury that is potentially life-threatening.  Dialysis patient presents with dyspnea associated with volume overload and missed dialysis session requiring urgent hemodialysis.  Stable on 4 L without distress or indications for BiPAP at this point.  Normal potassium.  Troponins are mildly and chronically elevated, flat on repeat.  Normocytic anemia without leukocytosis.  We will reach out to nephrology, I have paged Dr. Cherylann Ratel, but no call back yet.  With plans for hemodialysis and reassessment.  She may be suitable for outpatient management after a session of hemodialysis pending reevaluation      FINAL CLINICAL IMPRESSION(S) / ED DIAGNOSES   Final diagnoses:  Shortness of breath  Hypoxia  ESRD (end stage renal disease) on dialysis [facility])     Rx / DC Orders   ED Discharge Orders     None        Note:  This document was prepared using Dragon voice recognition software and may include unintentional dictation errors.   Delton Prairie, MD 01/19/23 [phone]

## 2023-01-19 NOTE — ED Provider Notes (Signed)
-----------------------------------------   5:47 PM on 01/19/2023 -----------------------------------------  The patient has returned from dialysis.  She appears comfortable and is not demonstrating any respiratory distress or increased work of breathing.  She states that she is feeling better.  She states she would like to go home.  At this time, there is no indication for inpatient admission.  The patient is stable for discharge.  I gave her strict return precautions and she expressed understanding.   Dionne Bucy, MD 01/19/23 1747

## 2023-01-19 NOTE — ED Notes (Addendum)
Patient currently waiting on EMS. Patient hooked up to wall oxygen 4L [place]. Patient continues to question where her ride is. This RN informed patient that EMS has been called and they will be here as soon as possible. Patient sitting in chair eating dinner tray at this time.

## 2023-01-22 DIAGNOSIS — Z992 Dependence on renal dialysis: Secondary | ICD-10-CM | POA: Diagnosis not present

## 2023-01-22 DIAGNOSIS — N186 End stage renal disease: Secondary | ICD-10-CM | POA: Diagnosis not present

## 2023-01-24 DIAGNOSIS — N186 End stage renal disease: Secondary | ICD-10-CM | POA: Diagnosis not present

## 2023-01-24 DIAGNOSIS — Z992 Dependence on renal dialysis: Secondary | ICD-10-CM | POA: Diagnosis not present

## 2023-01-26 DIAGNOSIS — Z992 Dependence on renal dialysis: Secondary | ICD-10-CM | POA: Diagnosis not present

## 2023-01-26 DIAGNOSIS — N186 End stage renal disease: Secondary | ICD-10-CM | POA: Diagnosis not present

## 2023-01-29 DIAGNOSIS — Z992 Dependence on renal dialysis: Secondary | ICD-10-CM | POA: Diagnosis not present

## 2023-01-29 DIAGNOSIS — N186 End stage renal disease: Secondary | ICD-10-CM | POA: Diagnosis not present

## 2023-01-31 DIAGNOSIS — N186 End stage renal disease: Secondary | ICD-10-CM | POA: Diagnosis not present

## 2023-01-31 DIAGNOSIS — Z992 Dependence on renal dialysis: Secondary | ICD-10-CM | POA: Diagnosis not present

## 2023-02-02 DIAGNOSIS — Z992 Dependence on renal dialysis: Secondary | ICD-10-CM | POA: Diagnosis not present

## 2023-02-02 DIAGNOSIS — N186 End stage renal disease: Secondary | ICD-10-CM | POA: Diagnosis not present

## 2023-02-04 DIAGNOSIS — Z992 Dependence on renal dialysis: Secondary | ICD-10-CM | POA: Diagnosis not present

## 2023-02-04 DIAGNOSIS — N186 End stage renal disease: Secondary | ICD-10-CM | POA: Diagnosis not present

## 2023-02-08 DIAGNOSIS — Z79899 Other long term (current) drug therapy: Secondary | ICD-10-CM | POA: Diagnosis not present

## 2023-02-08 DIAGNOSIS — N186 End stage renal disease: Secondary | ICD-10-CM | POA: Diagnosis not present

## 2023-02-08 DIAGNOSIS — Z992 Dependence on renal dialysis: Secondary | ICD-10-CM | POA: Diagnosis not present

## 2023-02-09 DIAGNOSIS — Z992 Dependence on renal dialysis: Secondary | ICD-10-CM | POA: Diagnosis not present

## 2023-02-09 DIAGNOSIS — N186 End stage renal disease: Secondary | ICD-10-CM | POA: Diagnosis not present

## 2023-02-12 DIAGNOSIS — N186 End stage renal disease: Secondary | ICD-10-CM | POA: Diagnosis not present

## 2023-02-12 DIAGNOSIS — Z992 Dependence on renal dialysis: Secondary | ICD-10-CM | POA: Diagnosis not present

## 2023-02-14 DIAGNOSIS — Z992 Dependence on renal dialysis: Secondary | ICD-10-CM | POA: Diagnosis not present

## 2023-02-14 DIAGNOSIS — N186 End stage renal disease: Secondary | ICD-10-CM | POA: Diagnosis not present

## 2023-02-16 DIAGNOSIS — Z992 Dependence on renal dialysis: Secondary | ICD-10-CM | POA: Diagnosis not present

## 2023-02-16 DIAGNOSIS — N186 End stage renal disease: Secondary | ICD-10-CM | POA: Diagnosis not present

## 2023-02-21 DIAGNOSIS — Z992 Dependence on renal dialysis: Secondary | ICD-10-CM | POA: Diagnosis not present

## 2023-02-21 DIAGNOSIS — N186 End stage renal disease: Secondary | ICD-10-CM | POA: Diagnosis not present

## 2023-02-26 DIAGNOSIS — N186 End stage renal disease: Secondary | ICD-10-CM | POA: Diagnosis not present

## 2023-02-26 DIAGNOSIS — Z992 Dependence on renal dialysis: Secondary | ICD-10-CM | POA: Diagnosis not present

## 2023-02-28 DIAGNOSIS — N186 End stage renal disease: Secondary | ICD-10-CM | POA: Diagnosis not present

## 2023-02-28 DIAGNOSIS — Z992 Dependence on renal dialysis: Secondary | ICD-10-CM | POA: Diagnosis not present

## 2023-03-05 DIAGNOSIS — N186 End stage renal disease: Secondary | ICD-10-CM | POA: Diagnosis not present

## 2023-03-05 DIAGNOSIS — Z992 Dependence on renal dialysis: Secondary | ICD-10-CM | POA: Diagnosis not present

## 2023-03-07 DIAGNOSIS — Z992 Dependence on renal dialysis: Secondary | ICD-10-CM | POA: Diagnosis not present

## 2023-03-07 DIAGNOSIS — N186 End stage renal disease: Secondary | ICD-10-CM | POA: Diagnosis not present

## 2023-03-09 DIAGNOSIS — Z992 Dependence on renal dialysis: Secondary | ICD-10-CM | POA: Diagnosis not present

## 2023-03-09 DIAGNOSIS — N186 End stage renal disease: Secondary | ICD-10-CM | POA: Diagnosis not present

## 2023-03-12 DIAGNOSIS — Z992 Dependence on renal dialysis: Secondary | ICD-10-CM | POA: Diagnosis not present

## 2023-03-12 DIAGNOSIS — N186 End stage renal disease: Secondary | ICD-10-CM | POA: Diagnosis not present

## 2023-03-14 DIAGNOSIS — N186 End stage renal disease: Secondary | ICD-10-CM | POA: Diagnosis not present

## 2023-03-14 DIAGNOSIS — Z992 Dependence on renal dialysis: Secondary | ICD-10-CM | POA: Diagnosis not present

## 2023-03-15 DIAGNOSIS — Z992 Dependence on renal dialysis: Secondary | ICD-10-CM | POA: Diagnosis not present

## 2023-03-15 DIAGNOSIS — N186 End stage renal disease: Secondary | ICD-10-CM | POA: Diagnosis not present

## 2023-03-16 DIAGNOSIS — Z992 Dependence on renal dialysis: Secondary | ICD-10-CM | POA: Diagnosis not present

## 2023-03-16 DIAGNOSIS — N186 End stage renal disease: Secondary | ICD-10-CM | POA: Diagnosis not present

## 2023-03-19 DIAGNOSIS — Z992 Dependence on renal dialysis: Secondary | ICD-10-CM | POA: Diagnosis not present

## 2023-03-19 DIAGNOSIS — N186 End stage renal disease: Secondary | ICD-10-CM | POA: Diagnosis not present

## 2023-03-21 DIAGNOSIS — Z992 Dependence on renal dialysis: Secondary | ICD-10-CM | POA: Diagnosis not present

## 2023-03-21 DIAGNOSIS — N186 End stage renal disease: Secondary | ICD-10-CM | POA: Diagnosis not present

## 2023-03-23 DIAGNOSIS — Z992 Dependence on renal dialysis: Secondary | ICD-10-CM | POA: Diagnosis not present

## 2023-03-23 DIAGNOSIS — N186 End stage renal disease: Secondary | ICD-10-CM | POA: Diagnosis not present

## 2023-03-26 DIAGNOSIS — N186 End stage renal disease: Secondary | ICD-10-CM | POA: Diagnosis not present

## 2023-03-26 DIAGNOSIS — Z992 Dependence on renal dialysis: Secondary | ICD-10-CM | POA: Diagnosis not present

## 2023-03-28 DIAGNOSIS — Z992 Dependence on renal dialysis: Secondary | ICD-10-CM | POA: Diagnosis not present

## 2023-03-28 DIAGNOSIS — N186 End stage renal disease: Secondary | ICD-10-CM | POA: Diagnosis not present

## 2023-03-30 DIAGNOSIS — Z992 Dependence on renal dialysis: Secondary | ICD-10-CM | POA: Diagnosis not present

## 2023-03-30 DIAGNOSIS — N186 End stage renal disease: Secondary | ICD-10-CM | POA: Diagnosis not present

## 2023-04-02 DIAGNOSIS — N186 End stage renal disease: Secondary | ICD-10-CM | POA: Diagnosis not present

## 2023-04-02 DIAGNOSIS — Z992 Dependence on renal dialysis: Secondary | ICD-10-CM | POA: Diagnosis not present

## 2023-04-06 DIAGNOSIS — Z992 Dependence on renal dialysis: Secondary | ICD-10-CM | POA: Diagnosis not present

## 2023-04-06 DIAGNOSIS — N186 End stage renal disease: Secondary | ICD-10-CM | POA: Diagnosis not present

## 2023-04-08 DIAGNOSIS — Z992 Dependence on renal dialysis: Secondary | ICD-10-CM | POA: Diagnosis not present

## 2023-04-08 DIAGNOSIS — N186 End stage renal disease: Secondary | ICD-10-CM | POA: Diagnosis not present

## 2023-04-09 DIAGNOSIS — Z992 Dependence on renal dialysis: Secondary | ICD-10-CM | POA: Diagnosis not present

## 2023-04-09 DIAGNOSIS — N186 End stage renal disease: Secondary | ICD-10-CM | POA: Diagnosis not present

## 2023-04-11 DIAGNOSIS — Z992 Dependence on renal dialysis: Secondary | ICD-10-CM | POA: Diagnosis not present

## 2023-04-11 DIAGNOSIS — N186 End stage renal disease: Secondary | ICD-10-CM | POA: Diagnosis not present

## 2023-04-12 DIAGNOSIS — N186 End stage renal disease: Secondary | ICD-10-CM | POA: Diagnosis not present

## 2023-04-12 DIAGNOSIS — Z992 Dependence on renal dialysis: Secondary | ICD-10-CM | POA: Diagnosis not present

## 2023-04-13 DIAGNOSIS — Z992 Dependence on renal dialysis: Secondary | ICD-10-CM | POA: Diagnosis not present

## 2023-04-13 DIAGNOSIS — N186 End stage renal disease: Secondary | ICD-10-CM | POA: Diagnosis not present

## 2023-04-16 DIAGNOSIS — N186 End stage renal disease: Secondary | ICD-10-CM | POA: Diagnosis not present

## 2023-04-16 DIAGNOSIS — Z992 Dependence on renal dialysis: Secondary | ICD-10-CM | POA: Diagnosis not present

## 2023-04-18 DIAGNOSIS — Z992 Dependence on renal dialysis: Secondary | ICD-10-CM | POA: Diagnosis not present

## 2023-04-18 DIAGNOSIS — N186 End stage renal disease: Secondary | ICD-10-CM | POA: Diagnosis not present

## 2023-04-20 DIAGNOSIS — N186 End stage renal disease: Secondary | ICD-10-CM | POA: Diagnosis not present

## 2023-04-20 DIAGNOSIS — Z992 Dependence on renal dialysis: Secondary | ICD-10-CM | POA: Diagnosis not present

## 2023-04-25 DIAGNOSIS — Z992 Dependence on renal dialysis: Secondary | ICD-10-CM | POA: Diagnosis not present

## 2023-04-25 DIAGNOSIS — N186 End stage renal disease: Secondary | ICD-10-CM | POA: Diagnosis not present

## 2023-04-27 DIAGNOSIS — Z992 Dependence on renal dialysis: Secondary | ICD-10-CM | POA: Diagnosis not present

## 2023-04-27 DIAGNOSIS — N186 End stage renal disease: Secondary | ICD-10-CM | POA: Diagnosis not present

## 2023-04-30 DIAGNOSIS — N186 End stage renal disease: Secondary | ICD-10-CM | POA: Diagnosis not present

## 2023-04-30 DIAGNOSIS — Z992 Dependence on renal dialysis: Secondary | ICD-10-CM | POA: Diagnosis not present

## 2023-05-02 DIAGNOSIS — N186 End stage renal disease: Secondary | ICD-10-CM | POA: Diagnosis not present

## 2023-05-02 DIAGNOSIS — Z992 Dependence on renal dialysis: Secondary | ICD-10-CM | POA: Diagnosis not present

## 2023-05-04 DIAGNOSIS — Z992 Dependence on renal dialysis: Secondary | ICD-10-CM | POA: Diagnosis not present

## 2023-05-04 DIAGNOSIS — N186 End stage renal disease: Secondary | ICD-10-CM | POA: Diagnosis not present

## 2023-05-07 DIAGNOSIS — N186 End stage renal disease: Secondary | ICD-10-CM | POA: Diagnosis not present

## 2023-05-07 DIAGNOSIS — Z992 Dependence on renal dialysis: Secondary | ICD-10-CM | POA: Diagnosis not present

## 2023-05-09 DIAGNOSIS — Z992 Dependence on renal dialysis: Secondary | ICD-10-CM | POA: Diagnosis not present

## 2023-05-09 DIAGNOSIS — N186 End stage renal disease: Secondary | ICD-10-CM | POA: Diagnosis not present

## 2023-05-11 DIAGNOSIS — Z992 Dependence on renal dialysis: Secondary | ICD-10-CM | POA: Diagnosis not present

## 2023-05-11 DIAGNOSIS — N186 End stage renal disease: Secondary | ICD-10-CM | POA: Diagnosis not present

## 2023-05-13 DIAGNOSIS — Z992 Dependence on renal dialysis: Secondary | ICD-10-CM | POA: Diagnosis not present

## 2023-05-13 DIAGNOSIS — N186 End stage renal disease: Secondary | ICD-10-CM | POA: Diagnosis not present

## 2023-05-14 DIAGNOSIS — Z992 Dependence on renal dialysis: Secondary | ICD-10-CM | POA: Diagnosis not present

## 2023-05-14 DIAGNOSIS — N186 End stage renal disease: Secondary | ICD-10-CM | POA: Diagnosis not present

## 2023-05-16 DIAGNOSIS — Z992 Dependence on renal dialysis: Secondary | ICD-10-CM | POA: Diagnosis not present

## 2023-05-16 DIAGNOSIS — N186 End stage renal disease: Secondary | ICD-10-CM | POA: Diagnosis not present

## 2023-05-18 DIAGNOSIS — N186 End stage renal disease: Secondary | ICD-10-CM | POA: Diagnosis not present

## 2023-05-18 DIAGNOSIS — Z992 Dependence on renal dialysis: Secondary | ICD-10-CM | POA: Diagnosis not present

## 2023-05-20 ENCOUNTER — Emergency Department
Admission: EM | Admit: 2023-05-20 | Discharge: 2023-05-20 | Disposition: A | Discharge status: Home / Self Care | Attending: Emergency Medicine | Admitting: Emergency Medicine

## 2023-05-20 ENCOUNTER — Emergency Department

## 2023-05-20 ENCOUNTER — Other Ambulatory Visit: Payer: Self-pay

## 2023-05-20 ENCOUNTER — Encounter: Payer: Self-pay | Admitting: Emergency Medicine

## 2023-05-20 DIAGNOSIS — R197 Diarrhea, unspecified: Secondary | ICD-10-CM | POA: Diagnosis not present

## 2023-05-20 DIAGNOSIS — N12 Tubulo-interstitial nephritis, not specified as acute or chronic: Secondary | ICD-10-CM | POA: Diagnosis not present

## 2023-05-20 DIAGNOSIS — I509 Heart failure, unspecified: Secondary | ICD-10-CM | POA: Diagnosis not present

## 2023-05-20 DIAGNOSIS — I517 Cardiomegaly: Secondary | ICD-10-CM | POA: Diagnosis not present

## 2023-05-20 DIAGNOSIS — R112 Nausea with vomiting, unspecified: Secondary | ICD-10-CM

## 2023-05-20 DIAGNOSIS — R1012 Left upper quadrant pain: Secondary | ICD-10-CM

## 2023-05-20 DIAGNOSIS — R109 Unspecified abdominal pain: Secondary | ICD-10-CM | POA: Diagnosis not present

## 2023-05-20 DIAGNOSIS — I132 Hypertensive heart and chronic kidney disease with heart failure and with stage 5 chronic kidney disease, or end stage renal disease: Secondary | ICD-10-CM | POA: Insufficient documentation

## 2023-05-20 DIAGNOSIS — N2889 Other specified disorders of kidney and ureter: Secondary | ICD-10-CM | POA: Insufficient documentation

## 2023-05-20 DIAGNOSIS — N186 End stage renal disease: Secondary | ICD-10-CM | POA: Diagnosis not present

## 2023-05-20 DIAGNOSIS — I10 Essential (primary) hypertension: Secondary | ICD-10-CM | POA: Diagnosis not present

## 2023-05-20 DIAGNOSIS — I2510 Atherosclerotic heart disease of native coronary artery without angina pectoris: Secondary | ICD-10-CM | POA: Diagnosis not present

## 2023-05-20 DIAGNOSIS — F17210 Nicotine dependence, cigarettes, uncomplicated: Secondary | ICD-10-CM | POA: Insufficient documentation

## 2023-05-20 DIAGNOSIS — K828 Other specified diseases of gallbladder: Secondary | ICD-10-CM | POA: Diagnosis not present

## 2023-05-20 DIAGNOSIS — Z992 Dependence on renal dialysis: Secondary | ICD-10-CM | POA: Diagnosis not present

## 2023-05-20 DIAGNOSIS — K8020 Calculus of gallbladder without cholecystitis without obstruction: Secondary | ICD-10-CM | POA: Diagnosis not present

## 2023-05-20 DIAGNOSIS — R0602 Shortness of breath: Secondary | ICD-10-CM | POA: Diagnosis not present

## 2023-05-20 DIAGNOSIS — R1110 Vomiting, unspecified: Secondary | ICD-10-CM | POA: Diagnosis not present

## 2023-05-20 DIAGNOSIS — I700 Atherosclerosis of aorta: Secondary | ICD-10-CM | POA: Diagnosis not present

## 2023-05-20 LAB — LIPASE, BLOOD: Lipase: 132 U/L — ABNORMAL HIGH (ref 11–51)

## 2023-05-20 LAB — COMPREHENSIVE METABOLIC PANEL WITH GFR
ALT: 10 U/L (ref 0–44)
AST: 16 U/L (ref 15–41)
Albumin: 3.8 g/dL (ref 3.5–5.0)
Alkaline Phosphatase: 41 U/L (ref 38–126)
Anion gap: 15 (ref 5–15)
BUN: 76 mg/dL — ABNORMAL HIGH (ref 8–23)
CO2: 27 mmol/L (ref 22–32)
Calcium: 8.7 mg/dL — ABNORMAL LOW (ref 8.9–10.3)
Chloride: 99 mmol/L (ref 98–111)
Creatinine, Ser: 8.23 mg/dL — ABNORMAL HIGH (ref 0.44–1.00)
GFR, Estimated: 5 mL/min — ABNORMAL LOW (ref >60)
Glucose, Bld: 131 mg/dL — ABNORMAL HIGH (ref 70–99)
Potassium: 4.1 mmol/L (ref 3.5–5.1)
Sodium: 141 mmol/L (ref 135–145)
Total Bilirubin: 0.5 mg/dL (ref 0.0–1.2)
Total Protein: 7.4 g/dL (ref 6.5–8.1)

## 2023-05-20 LAB — URINALYSIS, ROUTINE W REFLEX MICROSCOPIC
Bilirubin Urine: NEGATIVE
Glucose, UA: NEGATIVE mg/dL
Ketones, ur: NEGATIVE mg/dL
Leukocytes,Ua: NEGATIVE
Nitrite: NEGATIVE
Protein, ur: 100 mg/dL — AB
Specific Gravity, Urine: 1.014 (ref 1.005–1.030)
pH: 6 (ref 5.0–8.0)

## 2023-05-20 LAB — CBC
HCT: 41.5 % (ref 36.0–46.0)
Hemoglobin: 13 g/dL (ref 12.0–15.0)
MCH: 29.9 pg (ref 26.0–34.0)
MCHC: 31.3 g/dL (ref 30.0–36.0)
MCV: 95.4 fL (ref 80.0–100.0)
Platelets: 131 10*3/uL — ABNORMAL LOW (ref 150–400)
RBC: 4.35 MIL/uL (ref 3.87–5.11)
RDW: 16.6 % — ABNORMAL HIGH (ref 11.5–15.5)
WBC: 7.2 10*3/uL (ref 4.0–10.5)
nRBC: 0 % (ref 0.0–0.2)

## 2023-05-20 LAB — TROPONIN I (HIGH SENSITIVITY): Troponin I (High Sensitivity): 20 ng/L — ABNORMAL HIGH (ref <18)

## 2023-05-20 LAB — SARS CORONAVIRUS 2 BY RT PCR: SARS Coronavirus 2 by RT PCR: NEGATIVE

## 2023-05-20 MED ORDER — CIPROFLOXACIN HCL 500 MG PO TABS
500.0000 mg | ORAL_TABLET | Freq: Once | ORAL | Status: AC
  Administered 2023-05-20: 500 mg via ORAL
  Filled 2023-05-20: qty 1

## 2023-05-20 MED ORDER — CIPROFLOXACIN HCL 500 MG PO TABS: 500.0000 mg | ORAL_TABLET | Freq: Every day | ORAL | 0 refills | Status: AC

## 2023-05-20 MED ORDER — ONDANSETRON HCL 4 MG/2ML IJ SOLN
4.0000 mg | Freq: Once | INTRAMUSCULAR | Status: AC
  Administered 2023-05-20: 4 mg via INTRAVENOUS
  Filled 2023-05-20: qty 2

## 2023-05-20 MED ORDER — SODIUM CHLORIDE 0.9 % IV BOLUS
500.0000 mL | Freq: Once | INTRAVENOUS | Status: AC
  Administered 2023-05-20: 500 mL via INTRAVENOUS

## 2023-05-20 MED ORDER — IOHEXOL 300 MG/ML  SOLN
100.0000 mL | Freq: Once | INTRAMUSCULAR | Status: AC | PRN
  Administered 2023-05-20: 100 mL via INTRAVENOUS

## 2023-05-20 NOTE — ED Triage Notes (Signed)
 Pt in from home via [facility] with sob that began last night, emesis since about 0130 (pt states >5 episodes since onset). Hx of dialysis - last session was Saturday.  VS en route: 75HR 97%RA 194/105

## 2023-05-20 NOTE — Consult Note (Signed)
 SURGICAL CONSULTATION NOTE   HISTORY OF PRESENT ILLNESS (HPI):  73 y.o. female presented to [facility] ED for evaluation of shortness of breath. Patient reports she was supposed to have dialysis but due to shortness of breath she came to the ED.  At the ED she did complain of left lower quadrant pain.  She endorses associated nausea and vomiting with the chest pain.  Left lower abdominal pain localized to the left lower quadrant and very low almost in the pelvis.  No pain radiation.  Patient cannot identify any aggravating factors.  Patient endorses that the abdominal pain, nausea and vomiting improved with hydration and medication at the ED.  Patient currently abdominal pain-free during my evaluation.  At the ED she was found with white blood cell count of 7.2, hemoglobin of 13.0.  CMP shows no significant electrolyte disturbance.  BUN 76 creatinine 8.23, normal AST/ALT and alkaline phosphatase.  Normal total bilirubin.  She had a CT scan of the abdomen pelvis for evaluation of left lower quadrant pain.  She was found with cholelithiasis with suspected gallbladder wall thickening on the CT scan.  There was also severe left kidney hydronephrosis with pararenal space edema.  There was concern of stones versus enhancing tumor in the left renal collecting system.  I personally evaluated the images.  She also had an abdominal ultrasound that shows cholelithiasis with borderline gallbladder wall of 3 mm.  Normal common bile duct diameter of 3 mm.  Negative Murphy sign's.  Due to CT scan report of concern of gallbladder wall thickening on the CT scan with suspected cholecystitis, surgery was consulted for evaluation of suspected cholecystitis  Surgery is consulted by Dr. Larinda Buttery in this context for evaluation and management of suspected cholecystitis.  PAST MEDICAL HISTORY (PMH):  Past Medical History:  Diagnosis Date   Anemia    Arthritis    osteoarthritis of right knee   CHF (congestive heart failure) (HCC)     Chronic kidney disease    stage 3   Gout    Hepatitis C antibody test positive    Hyperlipidemia    Hypertension      PAST SURGICAL HISTORY (PSH):  Past Surgical History:  Procedure Laterality Date   A/V FISTULAGRAM N/A 09/22/2020   Procedure: A/V Fistulagram;  Surgeon: Annice Needy, MD;  Location: [facility] INVASIVE CV [facility];  Service: Cardiovascular;  Laterality: N/A;   AV FISTULA PLACEMENT Left 11/27/2019   Procedure: ARTERIOVENOUS (AV) Brachiocephalic FISTULA CREATION, possible graft;  Surgeon: Renford Dills, MD;  Location: [facility] ORS;  Service: Vascular;  Laterality: Left;   CARDIAC CATHETERIZATION     COLONOSCOPY WITH PROPOFOL N/A 03/22/2021   Procedure: COLONOSCOPY WITH PROPOFOL;  Surgeon: Toledo, Boykin Nearing, MD;  Location: [facility] ENDOSCOPY;  Service: Gastroenterology;  Laterality: N/A;   DIAGNOSTIC LAPAROSCOPY     ESOPHAGOGASTRODUODENOSCOPY N/A 03/22/2021   Procedure: ESOPHAGOGASTRODUODENOSCOPY (EGD);  Surgeon: Toledo, Boykin Nearing, MD;  Location: [facility] ENDOSCOPY;  Service: Gastroenterology;  Laterality: N/A;   LAPAROSCOPIC LYSIS OF ADHESIONS  08/12/2019   Procedure: LAPAROSCOPIC LYSIS OF ADHESIONS;  Surgeon: Leafy Ro, MD;  Location: [facility] ORS;  Service: General;;   PARTIAL KNEE ARTHROPLASTY Right 06/11/2017   Procedure: UNICOMPARTMENTAL KNEE;  Surgeon: Christena Flake, MD;  Location: [facility] ORS;  Service: Orthopedics;  Laterality: Right;   REPLACEMENT TOTAL KNEE Left 2012   TEMPORARY DIALYSIS CATHETER  09/20/2020   Procedure: TEMPORARY DIALYSIS CATHETER;  Surgeon: Renford Dills, MD;  Location: [facility];  Service: Cardiovascular;;  MEDICATIONS:  Prior to Admission medications   Medication Sig Start Date End Date Taking? Authorizing Provider  allopurinol (ZYLOPRIM) 100 MG tablet Take 100 mg by mouth every morning.     [provider]  amLODipine (NORVASC) 10 MG tablet Take 1 tablet (10 mg total) by mouth daily. 09/24/20 07/21/21  Lorin Glass, MD  aspirin EC  325 MG EC tablet Take 1 tablet (325 mg total) by mouth daily. 09/25/20   Lorin Glass, MD  atorvastatin (LIPITOR) 40 MG tablet Take 40 mg by mouth every morning.     [provider]  calcitRIOL (ROCALTROL) 0.25 MCG capsule Take 0.25 mcg by mouth daily. 11/06/19   [provider]  Calcium 500-100 MG-UNIT CHEW Chew 500 mg by mouth.    [provider]  isosorbide mononitrate (IMDUR) 60 MG 24 hr tablet Take 1 tablet (60 mg total) by mouth every morning. 09/24/20 07/21/21  Lorin Glass, MD  losartan (COZAAR) 25 MG tablet Take 1 tablet (25 mg total) by mouth at bedtime. 09/24/20 12/23/20  Lorin Glass, MD  losartan (COZAAR) 25 MG tablet Take 1 tablet by mouth at bedtime. Patient not taking: Reported on 11/27/2021 11/01/20   [provider]  metoprolol (TOPROL-XL) 200 MG 24 hr tablet Take 1 tablet (200 mg total) by mouth every morning. 09/24/20 07/21/21  Lorin Glass, MD  polyethylene glycol-electrolytes (NULYTELY) 420 g solution Take by mouth. Patient not taking: Reported on 11/27/2021 09/08/20   [provider]  torsemide 40 MG TABS Take 40 mg by mouth daily. Patient not taking: Reported on 07/21/2021 09/25/20 12/28/20  Lorin Glass, MD     ALLERGIES:  Allergies  Allergen Reactions   Penicillins Hives and Rash    Has patient had a PCN reaction causing immediate rash, facial/tongue/throat swelling, SOB or lightheadedness with hypotension: Yes Has patient had a PCN reaction causing severe rash involving mucus membranes or skin necrosis: Yes--all over body Has patient had a PCN reaction that required hospitalization: No  Has patient had a PCN reaction occurring within the last 10 years: No If all of the above answers are "NO", then may proceed with Cephalosporin use.      SOCIAL HISTORY:  Social History   Socioeconomic History   Marital status: Married    Spouse name: Not on file   Number of children: Not on file   Years of education: Not on file    Highest education level: Not on file  Occupational History   Not on file  Tobacco Use   Smoking status: Every Day    Current packs/day: 0.50    Types: Cigarettes   Smokeless tobacco: Never  Vaping Use   Vaping status: Never Used  Substance and Sexual Activity   Alcohol use: Never   Drug use: Not Currently    Comment: 30 years ago   Sexual activity: Not on file  Other Topics Concern   Not on file  Social History Narrative   Not on file   Social Drivers of Health   Financial Resource Strain: Not on file  Food Insecurity: No Food Insecurity (09/19/2021)   Hunger Vital Sign    Worried About Running Out of Food in the Last Year: Never true    Ran Out of Food in the Last Year: Never true  Transportation Needs: No Transportation Needs (09/19/2021)   PRAPARE - Administrator, Civil Service (Medical): No    Lack of Transportation (Non-Medical): No  Physical Activity: Not on file  Stress:  Not on file  Social Connections: Not on file  Intimate Partner Violence: Not on file      FAMILY HISTORY:  Family History  Problem Relation Age of Onset   Colon cancer Mother      REVIEW OF SYSTEMS:  Constitutional: denies weight loss, fever, chills, or sweats  Eyes: denies any other vision changes, history of eye injury  ENT: denies sore throat, hearing problems  Respiratory: Positive shortness of breath, wheezing  Cardiovascular: Positive chest pain Gastrointestinal: Positive abdominal pain, nausea and vomiting Genitourinary: denies burning with urination or urinary frequency Musculoskeletal: denies any other joint pains or cramps  Skin: denies any other rashes or skin discolorations  Neurological: denies any other headache, dizziness, weakness  Psychiatric: denies any other depression, anxiety   All other review of systems were negative   VITAL SIGNS:  Temp:  [98.3 F (36.8 C)-98.4 F (36.9 C)] 98.3 F (36.8 C) (04/07 1145) Pulse Rate:  [68-72] 68 (04/07 1145) Resp:   [18-22] 18 (04/07 1145) BP: (148-168)/(88-92) 148/88 (04/07 1145) SpO2:  [96 %] 96 % (04/07 1145) Weight:  [75.1 kg] 75.1 kg (04/07 0729)     Height: 5\' 4"  (162.6 cm) Weight: 75.1 kg BMI (Calculated): 28.41   INTAKE/OUTPUT:  This shift: No intake/output data recorded.  Last 2 shifts: @IOLAST2SHIFTS @   PHYSICAL EXAM:  Constitutional:  -- Normal body habitus  -- Awake, alert, and oriented x3  Eyes:  -- Pupils equally round and reactive to light  -- No scleral icterus  Ear, nose, and throat:  -- No jugular venous distension  Pulmonary:  -- No crackles  -- Equal breath sounds bilaterally -- Breathing non-labored at rest Cardiovascular:  -- S1, S2 present  -- No pericardial rubs Gastrointestinal:  -- Abdomen soft, nontender, non-distended, no guarding or rebound tenderness -- No abdominal masses appreciated, pulsatile or otherwise  Musculoskeletal and Integumentary:  -- Wounds: None appreciated -- Extremities: B/L UE and LE FROM, hands and feet warm, no edema  Neurologic:  -- Motor function: intact and symmetric -- Sensation: intact and symmetric   Labs:     Latest Ref Rng & Units 05/20/2023    7:11 AM 01/19/2023    1:57 AM 05/25/2022    1:28 PM  CBC  WBC 4.0 - 10.5 K/uL 7.2  9.3  6.1   Hemoglobin 12.0 - 15.0 g/dL 72.5  9.8  36.6   Hematocrit 36.0 - 46.0 % 41.5  32.5  35.8   Platelets 150 - 400 K/uL 131  104  153       Latest Ref Rng & Units 05/20/2023    7:11 AM 01/19/2023    1:57 AM 03/22/2021    9:37 AM  CMP  Glucose 70 - 99 mg/dL 440  347  89   BUN 8 - 23 mg/dL 76  71  74   Creatinine 0.44 - 1.00 mg/dL 4.25  9.56  3.87   Sodium 135 - 145 mmol/L 141  143  143   Potassium 3.5 - 5.1 mmol/L 4.1  4.1  4.6   Chloride 98 - 111 mmol/L 99  106  110   CO2 22 - 32 mmol/L 27  26    Calcium 8.9 - 10.3 mg/dL 8.7  7.7    Total Protein 6.5 - 8.1 g/dL 7.4  6.9    Total Bilirubin 0.0 - 1.2 mg/dL 0.5  1.0    Alkaline Phos 38 - 126 U/L 41  78    AST 15 - 41 U/L  16  27    ALT 0 -  44 U/L 10  25     Imaging studies:  EXAM: ULTRASOUND ABDOMEN LIMITED RIGHT UPPER QUADRANT   COMPARISON:  CT 05/20/2023.  Ultrasound 10/17/2018   FINDINGS: Gallbladder:   Distended gallbladder with stones. Borderline gallbladder wall thickening of 3 mm. No adjacent fluid or reported sonographic Murphy's sign.   Common bile duct:   Diameter: 3 mm   Liver:   No focal lesion identified. Within normal limits in parenchymal echogenicity. Portal vein is patent on color Doppler imaging with normal direction of blood flow towards the liver.   Other: None.   IMPRESSION: Gallstones. Borderline wall thickening of 3 mm. No Murphy's sign reported or adjacent fluid. No ductal dilatation.     Electronically Signed   By: Karen Kays M.D.   On: 05/20/2023 10:57  Assessment/Plan:  73 y.o. female with shortness of breath, suspected cholecystitis on CT scan, complicated by pertinent comorbidities including end-stage renal disease on hemodialysis, hypertension, coronary artery disease, hyperlipidemia.  Upon history and physical exam patient, with shortness of breath, nausea and vomiting since last night.  She did complain of left lower quadrant pain.  On physical exam patient endorsed that the pain was localized to the left lower quadrant almost at the pelvis.  Due to these pain in the left lower quadrant she had a CT scan that showed cholelithiasis with suspected gallbladder wall thickening.  Abdominal ultrasound shows gallbladder wall of 3 mm.  This is borderline normal especially in a patient with end-stage renal disease on hemodialysis.  Clinically no right upper quadrant pain without any convincing changes on ultrasound of cholecystitis.  Also patient symptoms improved with hydration and meds in the ED.  I have very low suspicious of cholecystitis.  I think that her left sided pain come from her hydronephrosis and possible mass versus stone.  No surgical intervention is recommended for this  patient.  Gae Gallop, MD

## 2023-05-20 NOTE — ED Provider Notes (Signed)
 [facility] Provider Note    Event Date/Time   First MD Initiated Contact with Patient 05/20/23 [phone]     (approximate)   History   Chief Complaint Shortness of Breath and Emesis   HPI  Anna Key is a 73 y.o. female with past medical history of hypertension, hyperlipidemia, CAD, CHF, and ESRD on HD (TTS) who presents to the ED complaining of nausea and vomiting.  Patient reports that she has been feeling nauseous with multiple episodes of vomiting since about 1:00 this morning.  This been associated with discomfort across her entire abdomen, she denies any associated diarrhea.  She also reports some pressure in her chest with some mild difficulty breathing, denies any associated fever or cough.  In regards to the pressure in her chest and difficulty breathing, patient states "I know what that is from" and reports it is due to smoking cigarettes.  She is not aware of any sick contacts, continues to make urine but denies any urinary symptoms.  Patient last received dialysis 2 days ago, next treatment is scheduled for tomorrow.     Physical Exam   Triage Vital Signs: ED Triage Vitals  Encounter Vitals Group     BP 05/20/23 0707 (!) 168/92     Systolic BP Percentile --      Diastolic BP Percentile --      Pulse Rate 05/20/23 0707 72     Resp 05/20/23 0707 (!) 22     Temp 05/20/23 0707 98.4 F (36.9 C)     Temp Source 05/20/23 0707 Oral     SpO2 05/20/23 0707 96 %     Weight 05/20/23 0708 165 lb 9.1 oz (75.1 kg)     Height 05/20/23 0729 5\' 4"  (1.626 m)     Head Circumference --      Peak Flow --      Pain Score 05/20/23 0707 8     Pain Loc --      Pain Education --      Exclude from Growth Chart --     Most recent vital signs: Vitals:   05/20/23 0707 05/20/23 1145  BP: (!) 168/92 (!) 148/88  Pulse: 72 68  Resp: (!) 22 18  Temp: 98.4 F (36.9 C) 98.3 F (36.8 C)  SpO2: 96% 96%    Constitutional: Alert and oriented. Eyes:  Conjunctivae are normal. Head: Atraumatic. Nose: No congestion/rhinnorhea. Mouth/Throat: Mucous membranes are moist.  Cardiovascular: Normal rate, regular rhythm. Grossly normal heart sounds.  2+ radial pulses bilaterally.  Left upper extremity AV fistula with palpable thrill. Respiratory: Normal respiratory effort.  No retractions. Lungs CTAB. Gastrointestinal: Soft and diffusely tender to palpation with no rebound or guarding. No distention. Musculoskeletal: No lower extremity tenderness nor edema.  Neurologic:  Normal speech and language. No gross focal neurologic deficits are appreciated.    ED Results / Procedures / Treatments   Labs (all labs ordered are listed, but only abnormal results are displayed) Labs Reviewed  LIPASE, BLOOD - Abnormal; Notable for the following components:      Result Value   Lipase 132 (*)    All other components within normal limits  COMPREHENSIVE METABOLIC PANEL WITH GFR - Abnormal; Notable for the following components:   Glucose, Bld 131 (*)    BUN 76 (*)    Creatinine, Ser 8.23 (*)    Calcium 8.7 (*)    GFR, Estimated 5 (*)    All other components within normal limits  CBC - Abnormal; Notable for the following components:   RDW 16.6 (*)    Platelets 131 (*)    All other components within normal limits  URINALYSIS, ROUTINE W REFLEX MICROSCOPIC - Abnormal; Notable for the following components:   Color, Urine YELLOW (*)    APPearance CLEAR (*)    Hgb urine dipstick MODERATE (*)    Protein, ur 100 (*)    Bacteria, UA RARE (*)    All other components within normal limits  TROPONIN I (HIGH SENSITIVITY) - Abnormal; Notable for the following components:   Troponin I (High Sensitivity) 20 (*)    All other components within normal limits  SARS CORONAVIRUS 2 BY RT PCR  URINE CULTURE     EKG  ED ECG REPORT I, Chesley Noon, the attending physician, personally viewed and interpreted this ECG.   Date: 05/20/2023  EKG Time: 7:09  Rate: 67   Rhythm: normal sinus rhythm  Axis: Normal  Intervals:first-degree A-V block   ST&T Change: Lateral T wave inversions, similar to previous  RADIOLOGY CXR reviewed and interpreted by me with no infiltrate, edema, or effusion.  PROCEDURES:  Critical Care performed: No  Procedures   MEDICATIONS ORDERED IN ED: Medications  ciprofloxacin (CIPRO) tablet 500 mg (has no administration in time range)  ondansetron (ZOFRAN) injection 4 mg (4 mg Intravenous Given 05/20/23 0844)  iohexol (OMNIPAQUE) 300 MG/ML solution 100 mL (100 mLs Intravenous Contrast Given 05/20/23 0911)  sodium chloride 0.9 % bolus 500 mL (500 mLs Intravenous New Bag/Given 05/20/23 1145)     IMPRESSION / MDM / ASSESSMENT AND PLAN / ED COURSE  I reviewed the triage vital signs and the nursing notes.                              73 y.o. female with past medical history of hypertension, hyperlipidemia, CAD, CHF, and ESRD on HD (TTS) who presents to the ED complaining of nausea, vomiting, and abdominal pain starting around 1:00 this morning along with some pressure in her chest and mild difficulty breathing.  Patient's presentation is most consistent with acute presentation with potential threat to life or bodily function.  Differential diagnosis includes, but is not limited to, gastroenteritis, gastritis, pancreatitis, hepatitis, cholecystitis, biliary colic, bowel obstruction, kidney stone, ACS, pneumonia, CHF, anemia, electrolyte abnormality, esophagitis.  Patient nontoxic-appearing and in no acute distress, vital signs are unremarkable in triage, but patient noted to be 88% oxygen saturation on room air during the time of my evaluation.  She is not in any respiratory distress, states that she wears oxygen as needed at home and was placed on 2 L nasal cannula with improvement.  She does have diffuse tenderness of her abdomen, will further assess with CT imaging.  EKG shows no evidence of arrhythmia or ischemia, troponin pending at  this time.  Labs thus far without significant anemia, leukocytosis, or electrolyte abnormality.  Patient with mild elevation in her lipase but LFTs are unremarkable.  She declines pain medication, will treat with IV Zofran and reassess following imaging.  CT imaging shows abnormal left kidney with mass versus multiple stones, associated hydronephrosis and hydroureter noted.  Findings reviewed with Dr. Apolinar Junes of urology, who does not feel that acute intervention is needed at this time given patient's baseline renal dysfunction.  She recommends outpatient follow-up for this issue, agrees that antibiotics may be warranted but patient does not appear septic at this time with reassuring vital signs  and no leukocytosis.  CT imaging also shows gallstones with some gallbladder wall thickening, which was further assessed with right upper quadrant ultrasound.  Ultrasound without significant thickening and findings discussed with Dr. Maia Plan of general surgery, who evaluated the patient.  She has no right upper quadrant pain or tenderness whatsoever at this time and no suspicion for acute biliary pathology per Dr. Maia Plan.  He also agrees that patient is appropriate for outpatient management and she is feeling much better on reassessment with no ongoing nausea or vomiting.  She is tolerating oral intake without difficulty, urine was sent for culture and we will treat with course of Cipro given her penicillin allergy with no documented cephalosporin use.  She was counseled to follow-up with PCP and urology, otherwise return to the ED for new worsening symptoms.  Patient agrees with plan.      FINAL CLINICAL IMPRESSION(S) / ED DIAGNOSES   Final diagnoses:  Nausea and vomiting, unspecified vomiting type  Left upper quadrant abdominal pain  Renal mass  Pyelonephritis     Rx / DC Orders   ED Discharge Orders          Ordered    ciprofloxacin (CIPRO) 500 MG tablet  Daily        05/20/23 1406              Note:  This document was prepared using Dragon voice recognition software and may include unintentional dictation errors.   Chesley Noon, MD 05/20/23 1425

## 2023-05-20 NOTE — ED Notes (Signed)
 See triage note Presents with some SOB with some n/v  States vomiting started about 130 am  Afebrile on arrival

## 2023-05-20 NOTE — ED Notes (Signed)
 See triage notes. Patient c/o shortness of breath that began last night with vomiting that started around 0130. Patient has a history of dialysis with the last session being Saturday.

## 2023-05-21 DIAGNOSIS — N186 End stage renal disease: Secondary | ICD-10-CM | POA: Diagnosis not present

## 2023-05-21 DIAGNOSIS — Z992 Dependence on renal dialysis: Secondary | ICD-10-CM | POA: Diagnosis not present

## 2023-05-21 LAB — URINE CULTURE: Culture: NO GROWTH

## 2023-05-24 DIAGNOSIS — N186 End stage renal disease: Secondary | ICD-10-CM | POA: Diagnosis not present

## 2023-05-24 DIAGNOSIS — Z992 Dependence on renal dialysis: Secondary | ICD-10-CM | POA: Diagnosis not present

## 2023-05-25 DIAGNOSIS — N186 End stage renal disease: Secondary | ICD-10-CM | POA: Diagnosis not present

## 2023-05-25 DIAGNOSIS — Z992 Dependence on renal dialysis: Secondary | ICD-10-CM | POA: Diagnosis not present

## 2023-05-28 DIAGNOSIS — N186 End stage renal disease: Secondary | ICD-10-CM | POA: Diagnosis not present

## 2023-05-28 DIAGNOSIS — Z992 Dependence on renal dialysis: Secondary | ICD-10-CM | POA: Diagnosis not present

## 2023-05-30 DIAGNOSIS — N186 End stage renal disease: Secondary | ICD-10-CM | POA: Diagnosis not present

## 2023-05-30 DIAGNOSIS — Z992 Dependence on renal dialysis: Secondary | ICD-10-CM | POA: Diagnosis not present

## 2023-06-01 DIAGNOSIS — Z992 Dependence on renal dialysis: Secondary | ICD-10-CM | POA: Diagnosis not present

## 2023-06-01 DIAGNOSIS — N186 End stage renal disease: Secondary | ICD-10-CM | POA: Diagnosis not present

## 2023-06-04 DIAGNOSIS — Z992 Dependence on renal dialysis: Secondary | ICD-10-CM | POA: Diagnosis not present

## 2023-06-04 DIAGNOSIS — N186 End stage renal disease: Secondary | ICD-10-CM | POA: Diagnosis not present

## 2023-06-05 ENCOUNTER — Telehealth: Payer: Self-pay

## 2023-06-05 NOTE — Telephone Encounter (Signed)
 Incoming call on triage line from pt who states she has one day of her current antibiotic left and questions if we can send in more medication for her or see her sooner. Advised pt that she should complete current antibiotic course, we will not be able to send in additional medication as she is not established as a patient and we would need to see her prior. Pt voiced understanding. Offered pt sooner appointment in [place]. Pt declined.

## 2023-06-06 ENCOUNTER — Emergency Department
Admission: EM | Admit: 2023-06-06 | Discharge: 2023-06-06 | Disposition: A | Discharge status: Home / Self Care | Attending: Emergency Medicine | Admitting: Emergency Medicine

## 2023-06-06 ENCOUNTER — Encounter: Payer: Self-pay | Admitting: Emergency Medicine

## 2023-06-06 ENCOUNTER — Other Ambulatory Visit: Payer: Self-pay

## 2023-06-06 DIAGNOSIS — Z992 Dependence on renal dialysis: Secondary | ICD-10-CM | POA: Diagnosis not present

## 2023-06-06 DIAGNOSIS — R319 Hematuria, unspecified: Secondary | ICD-10-CM | POA: Insufficient documentation

## 2023-06-06 DIAGNOSIS — R531 Weakness: Secondary | ICD-10-CM | POA: Insufficient documentation

## 2023-06-06 DIAGNOSIS — N186 End stage renal disease: Secondary | ICD-10-CM | POA: Diagnosis not present

## 2023-06-06 DIAGNOSIS — R001 Bradycardia, unspecified: Secondary | ICD-10-CM | POA: Diagnosis not present

## 2023-06-06 DIAGNOSIS — I10 Essential (primary) hypertension: Secondary | ICD-10-CM | POA: Diagnosis not present

## 2023-06-06 LAB — BASIC METABOLIC PANEL WITH GFR
Anion gap: 10 (ref 5–15)
BUN: 24 mg/dL — ABNORMAL HIGH (ref 8–23)
CO2: 29 mmol/L (ref 22–32)
Calcium: 8.9 mg/dL (ref 8.9–10.3)
Chloride: 95 mmol/L — ABNORMAL LOW (ref 98–111)
Creatinine, Ser: 4.47 mg/dL — ABNORMAL HIGH (ref 0.44–1.00)
GFR, Estimated: 10 mL/min — ABNORMAL LOW (ref >60)
Glucose, Bld: 85 mg/dL (ref 70–99)
Potassium: 3.5 mmol/L (ref 3.5–5.1)
Sodium: 134 mmol/L — ABNORMAL LOW (ref 135–145)

## 2023-06-06 LAB — CBC
HCT: 38.3 % (ref 36.0–46.0)
Hemoglobin: 11.8 g/dL — ABNORMAL LOW (ref 12.0–15.0)
MCH: 29.3 pg (ref 26.0–34.0)
MCHC: 30.8 g/dL (ref 30.0–36.0)
MCV: 95 fL (ref 80.0–100.0)
Platelets: 182 10*3/uL (ref 150–400)
RBC: 4.03 MIL/uL (ref 3.87–5.11)
RDW: 15.6 % — ABNORMAL HIGH (ref 11.5–15.5)
WBC: 4.5 10*3/uL (ref 4.0–10.5)
nRBC: 0 % (ref 0.0–0.2)

## 2023-06-06 LAB — TROPONIN I (HIGH SENSITIVITY): Troponin I (High Sensitivity): 23 ng/L — ABNORMAL HIGH (ref <18)

## 2023-06-06 NOTE — ED Notes (Signed)
 Pt states she can't pee and would like to go home

## 2023-06-06 NOTE — ED Notes (Signed)
 First Nurse Note: Pt to ED via [facility] from dialysis. Pt had her full dialysis treatment this morning. Pt has a renal mass dx'ed on 4/7. Pt reports that she has been peeing blood for the last 2 days. Pt is in NAD.

## 2023-06-06 NOTE — ED Triage Notes (Signed)
 Pt via [facility] from home. Pt c/o blood in her urine. States she was seen here on 4/7 dx with pyelonephritis but never followed up with urology. States PCP started her back on cipro  last week. Denies pain. Pt is dialysis pt [facility], denies missing any symptoms recently. Pt is A&Ox4 and NAD

## 2023-06-06 NOTE — ED Provider Notes (Signed)
 [facility] Provider Note    Event Date/Time   First MD Initiated Contact with Patient 06/06/23 1526     (approximate)   History   Hematuria   HPI  Annarose A Coste is a 73 y.o. female  who presents to the emergency department today because of concern for hematuria and pressure like feeling. Both symptoms have been going on for the past few days. Was seen in the emergency department roughly 2 and a half weeks ago. Had hematuria at that time and had abnormal CT scan of the abdomen concerning for mass vs stones in the left kidney with hydronephrosis and hydroureter. Was treated with antibiotics at that time. She says the pressure feeling is throughout her body and it has been associated with some weakness.    Physical Exam   Triage Vital Signs: ED Triage Vitals  Encounter Vitals Group     BP 06/06/23 1353 129/77     Systolic BP Percentile --      Diastolic BP Percentile --      Pulse Rate 06/06/23 1353 69     Resp 06/06/23 1353 19     Temp 06/06/23 1353 98.1 F (36.7 C)     Temp Source 06/06/23 1353 Oral     SpO2 06/06/23 1353 94 %     Weight 06/06/23 1356 150 lb (68 kg)     Height 06/06/23 1356 5\' 4"  (1.626 m)     Head Circumference --      Peak Flow --      Pain Score 06/06/23 1356 0     Pain Loc --      Pain Education --      Exclude from Growth Chart --     Most recent vital signs: Vitals:   06/06/23 1353  BP: 129/77  Pulse: 69  Resp: 19  Temp: 98.1 F (36.7 C)  SpO2: 94%   General: Awake, alert, oriented. CV:  Good peripheral perfusion. Regular rate and rhythm. Resp:  Normal effort. Lungs clear. Abd:  No distention. Non tender.   ED Results / Procedures / Treatments   Labs (all labs ordered are listed, but only abnormal results are displayed) Labs Reviewed  CBC - Abnormal; Notable for the following components:      Result Value   Hemoglobin 11.8 (*)    RDW 15.6 (*)    All other components within normal limits  BASIC  METABOLIC PANEL WITH GFR - Abnormal; Notable for the following components:   Sodium 134 (*)    Chloride 95 (*)    BUN 24 (*)    Creatinine, Ser 4.47 (*)    GFR, Estimated 10 (*)    All other components within normal limits  TROPONIN I (HIGH SENSITIVITY) - Abnormal; Notable for the following components:   Troponin I (High Sensitivity) 23 (*)    All other components within normal limits  URINALYSIS, ROUTINE W REFLEX MICROSCOPIC     EKG  I, Marylynn Soho, attending physician, personally viewed and interpreted this EKG  EKG Time: 1633 Rate: 55 Rhythm: sinus bradycardia Axis: normal Intervals: qtc 474 QRS: narrow, LVH ST changes: no st elevation Impression: abnormal ekg   RADIOLOGY None   PROCEDURES:  Critical Care performed: No    MEDICATIONS ORDERED IN ED: Medications - No data to display   IMPRESSION / MDM / ASSESSMENT AND PLAN / ED COURSE  I reviewed the triage vital signs and the nursing notes.  Differential diagnosis includes, but is not limited to, infection, neoplasm, stone, ACS  Patient's presentation is most consistent with acute presentation with potential threat to life or bodily function.   Patient presents to the emergency department today because of concerns for hematuria as well as a pressure-like feeling throughout her body.  Initial blood work without concerning leukocytosis.  Patient does have end-stage renal disease and is on dialysis.  Will add on troponin and obtain EKG for the pressure like feeling. Will check UA.  Troponin slightly elevated however consistent with previous values. EKG without concerning arrhythmia or ST elevation. Patient unable to give urine sample here in the emergency department. Did request discharge. At this time feel it is appropriate for patient to follow up with urology.    FINAL CLINICAL IMPRESSION(S) / ED DIAGNOSES   Final diagnoses:  Hematuria, unspecified type      Note:   This document was prepared using Dragon voice recognition software and may include unintentional dictation errors.    Marylynn Soho, MD 06/06/23 2001

## 2023-06-08 DIAGNOSIS — N186 End stage renal disease: Secondary | ICD-10-CM | POA: Diagnosis not present

## 2023-06-08 DIAGNOSIS — Z992 Dependence on renal dialysis: Secondary | ICD-10-CM | POA: Diagnosis not present

## 2023-06-11 DIAGNOSIS — Z992 Dependence on renal dialysis: Secondary | ICD-10-CM | POA: Diagnosis not present

## 2023-06-11 DIAGNOSIS — N186 End stage renal disease: Secondary | ICD-10-CM | POA: Diagnosis not present

## 2023-06-12 ENCOUNTER — Ambulatory Visit: Admitting: Urology

## 2023-06-12 DIAGNOSIS — Z992 Dependence on renal dialysis: Secondary | ICD-10-CM | POA: Diagnosis not present

## 2023-06-12 DIAGNOSIS — N186 End stage renal disease: Secondary | ICD-10-CM | POA: Diagnosis not present

## 2023-06-15 DIAGNOSIS — N186 End stage renal disease: Secondary | ICD-10-CM | POA: Diagnosis not present

## 2023-06-15 DIAGNOSIS — Z992 Dependence on renal dialysis: Secondary | ICD-10-CM | POA: Diagnosis not present

## 2023-06-18 DIAGNOSIS — N186 End stage renal disease: Secondary | ICD-10-CM | POA: Diagnosis not present

## 2023-06-18 DIAGNOSIS — Z992 Dependence on renal dialysis: Secondary | ICD-10-CM | POA: Diagnosis not present

## 2023-06-20 DIAGNOSIS — N186 End stage renal disease: Secondary | ICD-10-CM | POA: Diagnosis not present

## 2023-06-20 DIAGNOSIS — Z992 Dependence on renal dialysis: Secondary | ICD-10-CM | POA: Diagnosis not present

## 2023-06-22 DIAGNOSIS — Z992 Dependence on renal dialysis: Secondary | ICD-10-CM | POA: Diagnosis not present

## 2023-06-22 DIAGNOSIS — N186 End stage renal disease: Secondary | ICD-10-CM | POA: Diagnosis not present

## 2023-06-25 DIAGNOSIS — N186 End stage renal disease: Secondary | ICD-10-CM | POA: Diagnosis not present

## 2023-06-25 DIAGNOSIS — Z992 Dependence on renal dialysis: Secondary | ICD-10-CM | POA: Diagnosis not present

## 2023-06-27 DIAGNOSIS — Z992 Dependence on renal dialysis: Secondary | ICD-10-CM | POA: Diagnosis not present

## 2023-06-27 DIAGNOSIS — N186 End stage renal disease: Secondary | ICD-10-CM | POA: Diagnosis not present

## 2023-06-29 DIAGNOSIS — Z992 Dependence on renal dialysis: Secondary | ICD-10-CM | POA: Diagnosis not present

## 2023-06-29 DIAGNOSIS — N186 End stage renal disease: Secondary | ICD-10-CM | POA: Diagnosis not present

## 2023-07-02 DIAGNOSIS — N186 End stage renal disease: Secondary | ICD-10-CM | POA: Diagnosis not present

## 2023-07-02 DIAGNOSIS — Z992 Dependence on renal dialysis: Secondary | ICD-10-CM | POA: Diagnosis not present

## 2023-07-04 ENCOUNTER — Ambulatory Visit: Admitting: Urology

## 2023-07-04 DIAGNOSIS — Z992 Dependence on renal dialysis: Secondary | ICD-10-CM | POA: Diagnosis not present

## 2023-07-04 DIAGNOSIS — N186 End stage renal disease: Secondary | ICD-10-CM | POA: Diagnosis not present

## 2023-07-06 DIAGNOSIS — Z992 Dependence on renal dialysis: Secondary | ICD-10-CM | POA: Diagnosis not present

## 2023-07-06 DIAGNOSIS — N186 End stage renal disease: Secondary | ICD-10-CM | POA: Diagnosis not present

## 2023-07-09 DIAGNOSIS — N186 End stage renal disease: Secondary | ICD-10-CM | POA: Diagnosis not present

## 2023-07-09 DIAGNOSIS — Z992 Dependence on renal dialysis: Secondary | ICD-10-CM | POA: Diagnosis not present

## 2023-07-11 DIAGNOSIS — N186 End stage renal disease: Secondary | ICD-10-CM | POA: Diagnosis not present

## 2023-07-11 DIAGNOSIS — Z992 Dependence on renal dialysis: Secondary | ICD-10-CM | POA: Diagnosis not present

## 2023-07-13 DIAGNOSIS — N186 End stage renal disease: Secondary | ICD-10-CM | POA: Diagnosis not present

## 2023-07-13 DIAGNOSIS — Z992 Dependence on renal dialysis: Secondary | ICD-10-CM | POA: Diagnosis not present

## 2023-07-16 DIAGNOSIS — N186 End stage renal disease: Secondary | ICD-10-CM | POA: Diagnosis not present

## 2023-07-16 DIAGNOSIS — Z992 Dependence on renal dialysis: Secondary | ICD-10-CM | POA: Diagnosis not present

## 2023-07-17 IMAGING — CR DG CHEST 2V
1 series · 2 of 2 positions shown · non-contrast
Comparison: May 29, 2017.

CLINICAL DATA: Shortness of breath.

EXAM:
CHEST - 2 VIEW

[Series 1: dg chest 2 view · 0.14mm/px · 2 of 2 slices shown]
[im 1/2]
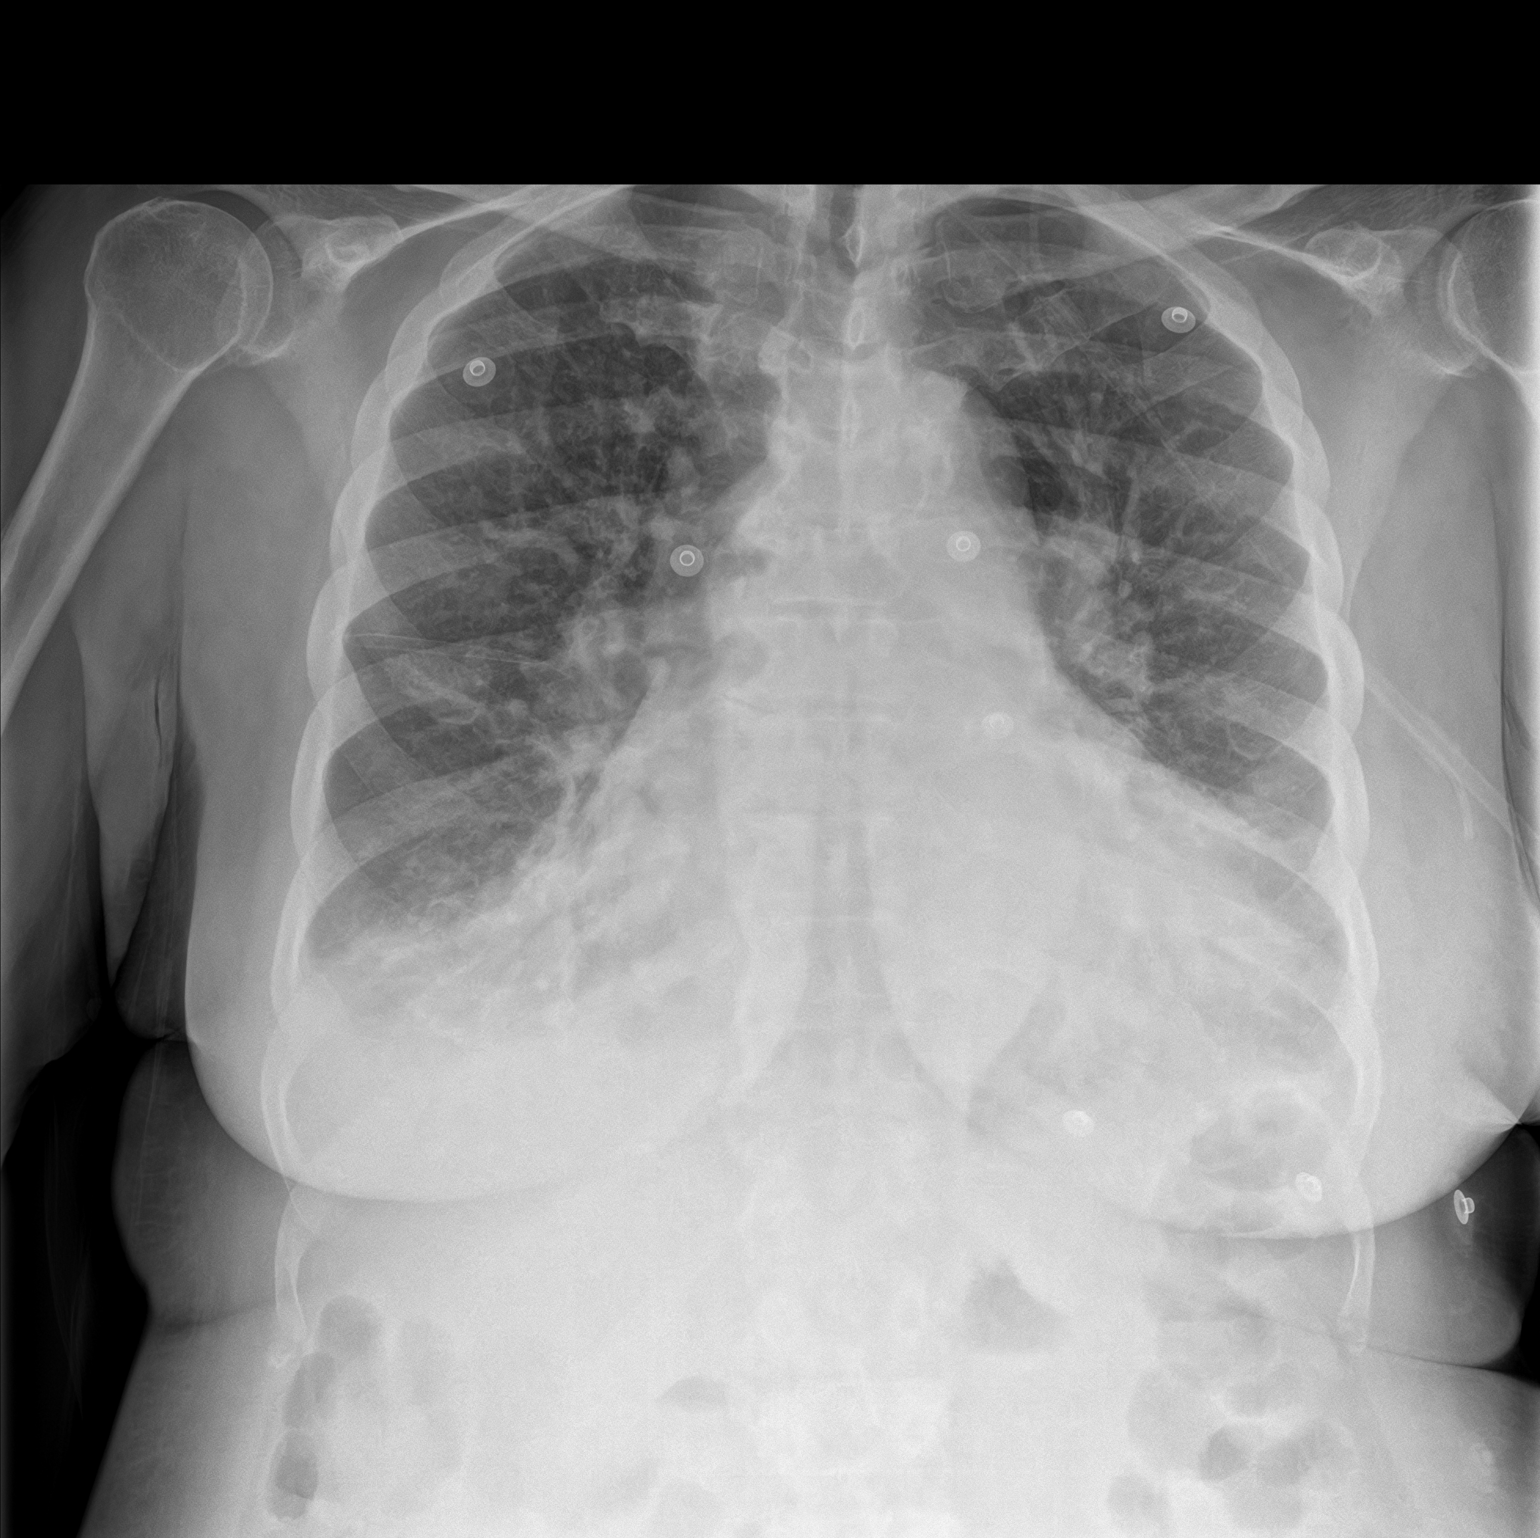
[im 2/2]
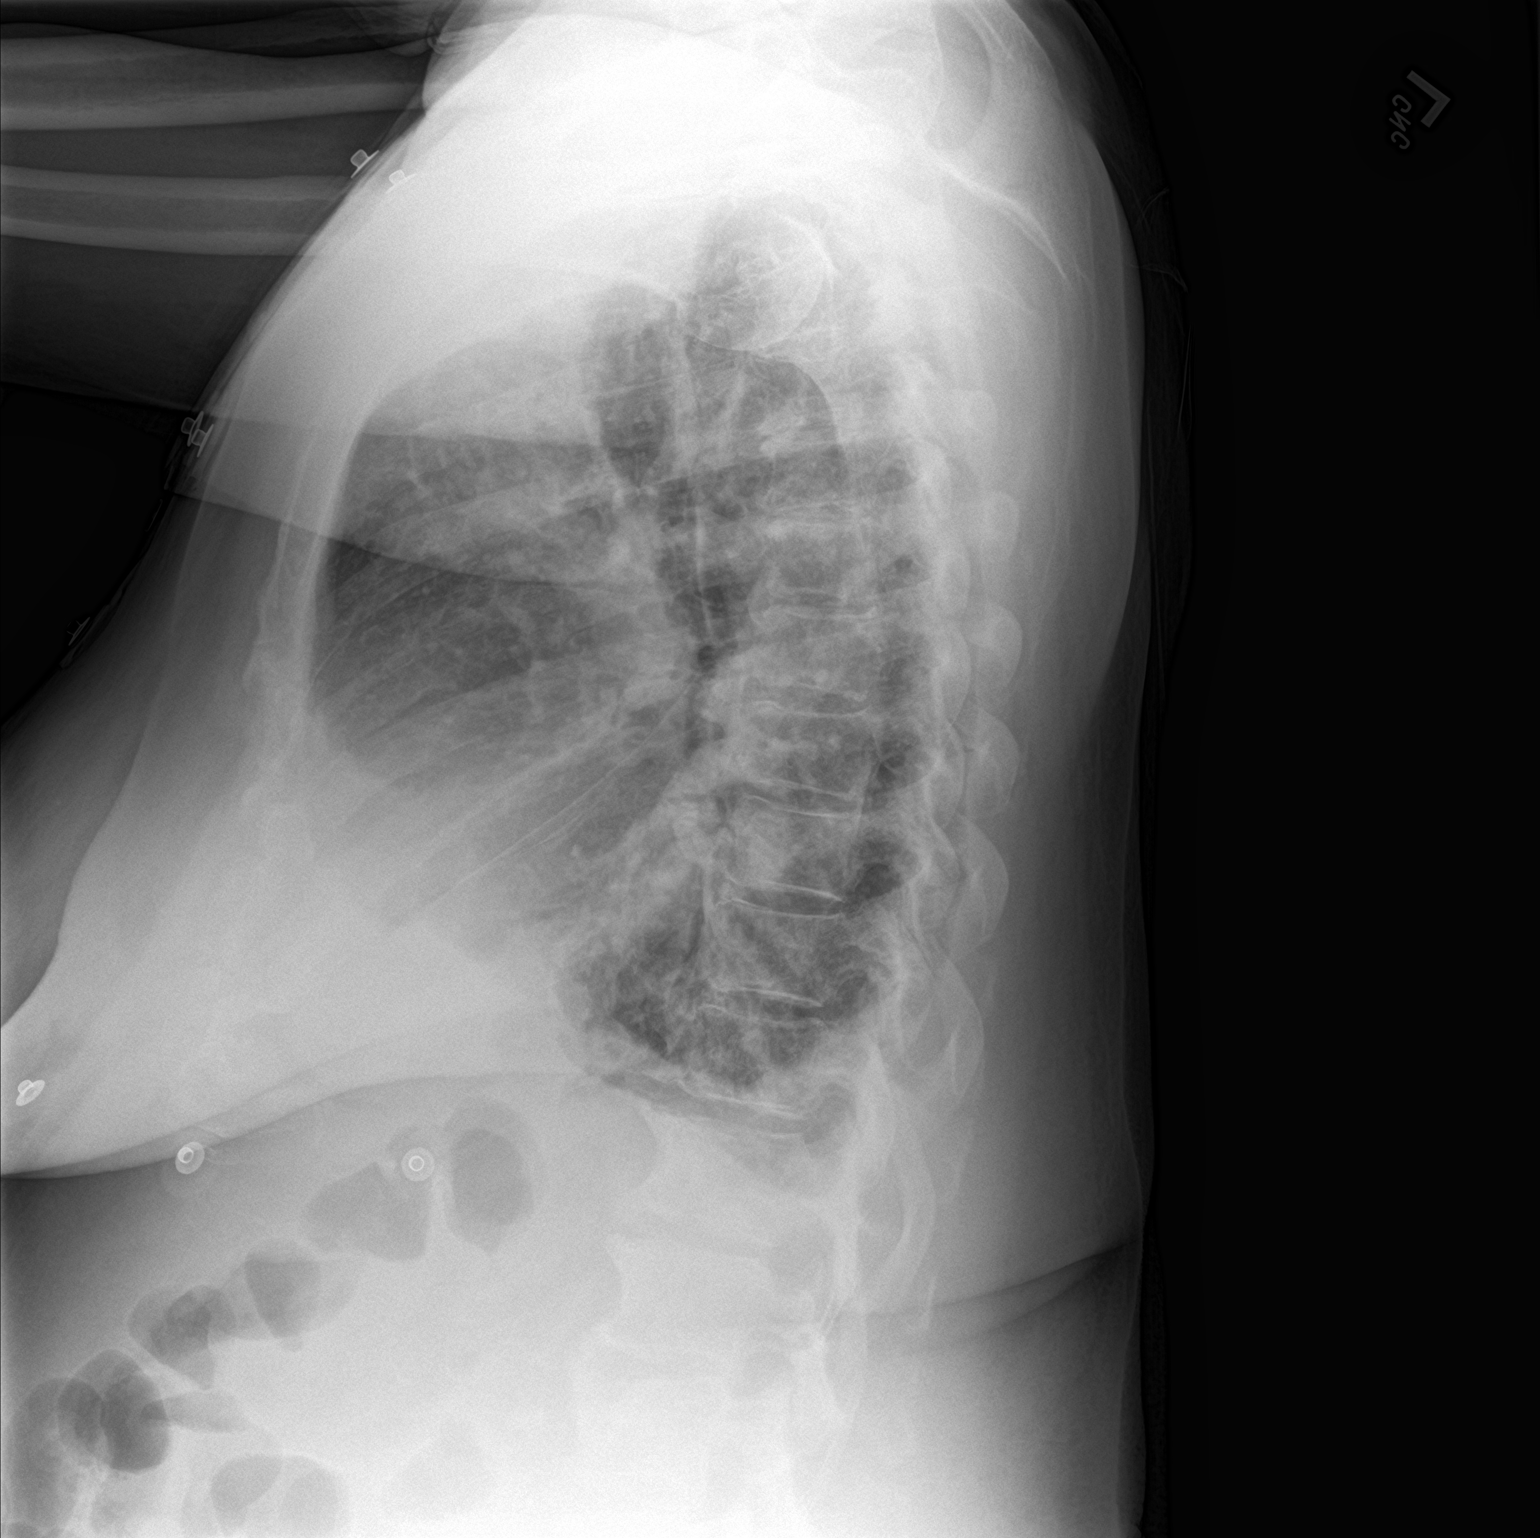

[2 of 2 positions shown; findings below may reference images not displayed]

FINDINGS: Enlarged cardiac silhouette. Central pulmonary vascular congestion.
Small bilateral pleural effusions, new. Overlying bibasilar
opacities. No visible pleural effusions or pneumothorax. No acute
osseous abnormality. Degenerative changes of the spine.
IMPRESSION: 1. Cardiomegaly, central pulmonary vascular congestion, and new
small bilateral pleural effusions.
2. Overlying bibasilar opacities, which could represent atelectasis,
aspiration, and/or pneumonia.

## 2023-07-18 DIAGNOSIS — N186 End stage renal disease: Secondary | ICD-10-CM | POA: Diagnosis not present

## 2023-07-18 DIAGNOSIS — Z992 Dependence on renal dialysis: Secondary | ICD-10-CM | POA: Diagnosis not present

## 2023-07-20 DIAGNOSIS — N186 End stage renal disease: Secondary | ICD-10-CM | POA: Diagnosis not present

## 2023-07-20 DIAGNOSIS — Z992 Dependence on renal dialysis: Secondary | ICD-10-CM | POA: Diagnosis not present

## 2023-07-23 DIAGNOSIS — Z992 Dependence on renal dialysis: Secondary | ICD-10-CM | POA: Diagnosis not present

## 2023-07-23 DIAGNOSIS — N186 End stage renal disease: Secondary | ICD-10-CM | POA: Diagnosis not present

## 2023-07-25 DIAGNOSIS — Z992 Dependence on renal dialysis: Secondary | ICD-10-CM | POA: Diagnosis not present

## 2023-07-25 DIAGNOSIS — N186 End stage renal disease: Secondary | ICD-10-CM | POA: Diagnosis not present

## 2023-07-27 DIAGNOSIS — Z992 Dependence on renal dialysis: Secondary | ICD-10-CM | POA: Diagnosis not present

## 2023-07-27 DIAGNOSIS — N186 End stage renal disease: Secondary | ICD-10-CM | POA: Diagnosis not present

## 2023-07-30 DIAGNOSIS — N186 End stage renal disease: Secondary | ICD-10-CM | POA: Diagnosis not present

## 2023-07-30 DIAGNOSIS — Z992 Dependence on renal dialysis: Secondary | ICD-10-CM | POA: Diagnosis not present

## 2023-08-01 DIAGNOSIS — N186 End stage renal disease: Secondary | ICD-10-CM | POA: Diagnosis not present

## 2023-08-01 DIAGNOSIS — Z992 Dependence on renal dialysis: Secondary | ICD-10-CM | POA: Diagnosis not present

## 2023-08-03 DIAGNOSIS — Z992 Dependence on renal dialysis: Secondary | ICD-10-CM | POA: Diagnosis not present

## 2023-08-03 DIAGNOSIS — N186 End stage renal disease: Secondary | ICD-10-CM | POA: Diagnosis not present

## 2023-08-06 DIAGNOSIS — N186 End stage renal disease: Secondary | ICD-10-CM | POA: Diagnosis not present

## 2023-08-06 DIAGNOSIS — Z992 Dependence on renal dialysis: Secondary | ICD-10-CM | POA: Diagnosis not present

## 2023-08-08 DIAGNOSIS — N186 End stage renal disease: Secondary | ICD-10-CM | POA: Diagnosis not present

## 2023-08-08 DIAGNOSIS — Z992 Dependence on renal dialysis: Secondary | ICD-10-CM | POA: Diagnosis not present

## 2023-08-10 DIAGNOSIS — N186 End stage renal disease: Secondary | ICD-10-CM | POA: Diagnosis not present

## 2023-08-10 DIAGNOSIS — Z992 Dependence on renal dialysis: Secondary | ICD-10-CM | POA: Diagnosis not present

## 2023-08-12 ENCOUNTER — Ambulatory Visit: Admitting: Urology

## 2023-08-12 DIAGNOSIS — Z992 Dependence on renal dialysis: Secondary | ICD-10-CM | POA: Diagnosis not present

## 2023-08-12 DIAGNOSIS — N186 End stage renal disease: Secondary | ICD-10-CM | POA: Diagnosis not present

## 2023-08-13 DIAGNOSIS — N186 End stage renal disease: Secondary | ICD-10-CM | POA: Diagnosis not present

## 2023-08-13 DIAGNOSIS — Z992 Dependence on renal dialysis: Secondary | ICD-10-CM | POA: Diagnosis not present

## 2023-08-15 DIAGNOSIS — N186 End stage renal disease: Secondary | ICD-10-CM | POA: Diagnosis not present

## 2023-08-15 DIAGNOSIS — Z992 Dependence on renal dialysis: Secondary | ICD-10-CM | POA: Diagnosis not present

## 2023-08-20 DIAGNOSIS — Z992 Dependence on renal dialysis: Secondary | ICD-10-CM | POA: Diagnosis not present

## 2023-08-20 DIAGNOSIS — N186 End stage renal disease: Secondary | ICD-10-CM | POA: Diagnosis not present

## 2023-08-22 DIAGNOSIS — N186 End stage renal disease: Secondary | ICD-10-CM | POA: Diagnosis not present

## 2023-08-22 DIAGNOSIS — Z992 Dependence on renal dialysis: Secondary | ICD-10-CM | POA: Diagnosis not present

## 2023-08-24 DIAGNOSIS — Z992 Dependence on renal dialysis: Secondary | ICD-10-CM | POA: Diagnosis not present

## 2023-08-24 DIAGNOSIS — N186 End stage renal disease: Secondary | ICD-10-CM | POA: Diagnosis not present

## 2023-08-27 DIAGNOSIS — Z992 Dependence on renal dialysis: Secondary | ICD-10-CM | POA: Diagnosis not present

## 2023-08-27 DIAGNOSIS — N186 End stage renal disease: Secondary | ICD-10-CM | POA: Diagnosis not present

## 2023-08-29 DIAGNOSIS — N186 End stage renal disease: Secondary | ICD-10-CM | POA: Diagnosis not present

## 2023-08-29 DIAGNOSIS — Z992 Dependence on renal dialysis: Secondary | ICD-10-CM | POA: Diagnosis not present

## 2023-08-31 DIAGNOSIS — N186 End stage renal disease: Secondary | ICD-10-CM | POA: Diagnosis not present

## 2023-08-31 DIAGNOSIS — Z992 Dependence on renal dialysis: Secondary | ICD-10-CM | POA: Diagnosis not present

## 2023-09-03 DIAGNOSIS — Z992 Dependence on renal dialysis: Secondary | ICD-10-CM | POA: Diagnosis not present

## 2023-09-03 DIAGNOSIS — N186 End stage renal disease: Secondary | ICD-10-CM | POA: Diagnosis not present

## 2023-09-05 DIAGNOSIS — N186 End stage renal disease: Secondary | ICD-10-CM | POA: Diagnosis not present

## 2023-09-05 DIAGNOSIS — Z992 Dependence on renal dialysis: Secondary | ICD-10-CM | POA: Diagnosis not present

## 2023-09-07 DIAGNOSIS — N186 End stage renal disease: Secondary | ICD-10-CM | POA: Diagnosis not present

## 2023-09-07 DIAGNOSIS — Z992 Dependence on renal dialysis: Secondary | ICD-10-CM | POA: Diagnosis not present

## 2023-09-10 DIAGNOSIS — N186 End stage renal disease: Secondary | ICD-10-CM | POA: Diagnosis not present

## 2023-09-10 DIAGNOSIS — Z992 Dependence on renal dialysis: Secondary | ICD-10-CM | POA: Diagnosis not present

## 2023-09-12 DIAGNOSIS — N186 End stage renal disease: Secondary | ICD-10-CM | POA: Diagnosis not present

## 2023-09-12 DIAGNOSIS — Z992 Dependence on renal dialysis: Secondary | ICD-10-CM | POA: Diagnosis not present

## 2023-09-14 DIAGNOSIS — Z992 Dependence on renal dialysis: Secondary | ICD-10-CM | POA: Diagnosis not present

## 2023-09-14 DIAGNOSIS — N186 End stage renal disease: Secondary | ICD-10-CM | POA: Diagnosis not present

## 2023-09-17 DIAGNOSIS — Z992 Dependence on renal dialysis: Secondary | ICD-10-CM | POA: Diagnosis not present

## 2023-09-17 DIAGNOSIS — N186 End stage renal disease: Secondary | ICD-10-CM | POA: Diagnosis not present

## 2023-09-19 DIAGNOSIS — Z992 Dependence on renal dialysis: Secondary | ICD-10-CM | POA: Diagnosis not present

## 2023-09-19 DIAGNOSIS — N186 End stage renal disease: Secondary | ICD-10-CM | POA: Diagnosis not present

## 2023-09-21 DIAGNOSIS — N186 End stage renal disease: Secondary | ICD-10-CM | POA: Diagnosis not present

## 2023-09-21 DIAGNOSIS — Z992 Dependence on renal dialysis: Secondary | ICD-10-CM | POA: Diagnosis not present

## 2023-09-25 DIAGNOSIS — N186 End stage renal disease: Secondary | ICD-10-CM | POA: Diagnosis not present

## 2023-09-25 DIAGNOSIS — Z992 Dependence on renal dialysis: Secondary | ICD-10-CM | POA: Diagnosis not present

## 2023-09-26 DIAGNOSIS — Z992 Dependence on renal dialysis: Secondary | ICD-10-CM | POA: Diagnosis not present

## 2023-09-26 DIAGNOSIS — N186 End stage renal disease: Secondary | ICD-10-CM | POA: Diagnosis not present

## 2023-09-28 DIAGNOSIS — N186 End stage renal disease: Secondary | ICD-10-CM | POA: Diagnosis not present

## 2023-09-28 DIAGNOSIS — Z992 Dependence on renal dialysis: Secondary | ICD-10-CM | POA: Diagnosis not present

## 2023-10-01 DIAGNOSIS — N186 End stage renal disease: Secondary | ICD-10-CM | POA: Diagnosis not present

## 2023-10-01 DIAGNOSIS — Z992 Dependence on renal dialysis: Secondary | ICD-10-CM | POA: Diagnosis not present

## 2023-10-03 DIAGNOSIS — N186 End stage renal disease: Secondary | ICD-10-CM | POA: Diagnosis not present

## 2023-10-03 DIAGNOSIS — Z992 Dependence on renal dialysis: Secondary | ICD-10-CM | POA: Diagnosis not present

## 2023-10-05 DIAGNOSIS — N186 End stage renal disease: Secondary | ICD-10-CM | POA: Diagnosis not present

## 2023-10-05 DIAGNOSIS — Z992 Dependence on renal dialysis: Secondary | ICD-10-CM | POA: Diagnosis not present

## 2023-10-08 DIAGNOSIS — N186 End stage renal disease: Secondary | ICD-10-CM | POA: Diagnosis not present

## 2023-10-08 DIAGNOSIS — Z992 Dependence on renal dialysis: Secondary | ICD-10-CM | POA: Diagnosis not present

## 2023-10-10 DIAGNOSIS — N186 End stage renal disease: Secondary | ICD-10-CM | POA: Diagnosis not present

## 2023-10-10 DIAGNOSIS — Z992 Dependence on renal dialysis: Secondary | ICD-10-CM | POA: Diagnosis not present

## 2023-10-12 DIAGNOSIS — N186 End stage renal disease: Secondary | ICD-10-CM | POA: Diagnosis not present

## 2023-10-12 DIAGNOSIS — Z992 Dependence on renal dialysis: Secondary | ICD-10-CM | POA: Diagnosis not present

## 2023-10-13 DIAGNOSIS — N186 End stage renal disease: Secondary | ICD-10-CM | POA: Diagnosis not present

## 2023-10-13 DIAGNOSIS — Z992 Dependence on renal dialysis: Secondary | ICD-10-CM | POA: Diagnosis not present

## 2023-10-17 DIAGNOSIS — Z992 Dependence on renal dialysis: Secondary | ICD-10-CM | POA: Diagnosis not present

## 2023-10-17 DIAGNOSIS — N186 End stage renal disease: Secondary | ICD-10-CM | POA: Diagnosis not present

## 2023-10-19 DIAGNOSIS — N186 End stage renal disease: Secondary | ICD-10-CM | POA: Diagnosis not present

## 2023-10-19 DIAGNOSIS — Z992 Dependence on renal dialysis: Secondary | ICD-10-CM | POA: Diagnosis not present

## 2023-10-22 DIAGNOSIS — N186 End stage renal disease: Secondary | ICD-10-CM | POA: Diagnosis not present

## 2023-10-22 DIAGNOSIS — Z992 Dependence on renal dialysis: Secondary | ICD-10-CM | POA: Diagnosis not present

## 2023-10-24 DIAGNOSIS — N186 End stage renal disease: Secondary | ICD-10-CM | POA: Diagnosis not present

## 2023-10-26 DIAGNOSIS — Z992 Dependence on renal dialysis: Secondary | ICD-10-CM | POA: Diagnosis not present

## 2023-10-26 DIAGNOSIS — N186 End stage renal disease: Secondary | ICD-10-CM | POA: Diagnosis not present

## 2023-10-29 DIAGNOSIS — N186 End stage renal disease: Secondary | ICD-10-CM | POA: Diagnosis not present

## 2023-10-29 DIAGNOSIS — Z992 Dependence on renal dialysis: Secondary | ICD-10-CM | POA: Diagnosis not present

## 2023-10-31 DIAGNOSIS — Z992 Dependence on renal dialysis: Secondary | ICD-10-CM | POA: Diagnosis not present

## 2023-10-31 DIAGNOSIS — N186 End stage renal disease: Secondary | ICD-10-CM | POA: Diagnosis not present

## 2023-11-02 DIAGNOSIS — Z992 Dependence on renal dialysis: Secondary | ICD-10-CM | POA: Diagnosis not present

## 2023-11-02 DIAGNOSIS — N186 End stage renal disease: Secondary | ICD-10-CM | POA: Diagnosis not present

## 2023-11-05 DIAGNOSIS — N186 End stage renal disease: Secondary | ICD-10-CM | POA: Diagnosis not present

## 2023-11-05 DIAGNOSIS — Z992 Dependence on renal dialysis: Secondary | ICD-10-CM | POA: Diagnosis not present

## 2023-11-07 DIAGNOSIS — Z992 Dependence on renal dialysis: Secondary | ICD-10-CM | POA: Diagnosis not present

## 2023-11-07 DIAGNOSIS — N186 End stage renal disease: Secondary | ICD-10-CM | POA: Diagnosis not present

## 2023-11-09 DIAGNOSIS — Z992 Dependence on renal dialysis: Secondary | ICD-10-CM | POA: Diagnosis not present

## 2023-11-09 DIAGNOSIS — N186 End stage renal disease: Secondary | ICD-10-CM | POA: Diagnosis not present

## 2023-11-12 DIAGNOSIS — Z992 Dependence on renal dialysis: Secondary | ICD-10-CM | POA: Diagnosis not present

## 2023-11-12 DIAGNOSIS — N186 End stage renal disease: Secondary | ICD-10-CM | POA: Diagnosis not present

## 2023-11-14 DIAGNOSIS — Z992 Dependence on renal dialysis: Secondary | ICD-10-CM | POA: Diagnosis not present

## 2023-11-14 DIAGNOSIS — N186 End stage renal disease: Secondary | ICD-10-CM | POA: Diagnosis not present

## 2023-11-26 DIAGNOSIS — N186 End stage renal disease: Secondary | ICD-10-CM | POA: Diagnosis not present

## 2023-11-26 DIAGNOSIS — Z992 Dependence on renal dialysis: Secondary | ICD-10-CM | POA: Diagnosis not present

## 2023-11-28 ENCOUNTER — Other Ambulatory Visit: Payer: Self-pay

## 2023-11-28 DIAGNOSIS — Z780 Asymptomatic menopausal state: Secondary | ICD-10-CM

## 2023-11-28 DIAGNOSIS — Z13820 Encounter for screening for osteoporosis: Secondary | ICD-10-CM

## 2023-11-28 DIAGNOSIS — Z1231 Encounter for screening mammogram for malignant neoplasm of breast: Secondary | ICD-10-CM

## 2023-11-28 DIAGNOSIS — Z992 Dependence on renal dialysis: Secondary | ICD-10-CM | POA: Diagnosis not present

## 2023-12-05 DIAGNOSIS — N186 End stage renal disease: Secondary | ICD-10-CM | POA: Diagnosis not present

## 2023-12-07 DIAGNOSIS — Z992 Dependence on renal dialysis: Secondary | ICD-10-CM | POA: Diagnosis not present

## 2023-12-10 DIAGNOSIS — N186 End stage renal disease: Secondary | ICD-10-CM | POA: Diagnosis not present

## 2023-12-10 DIAGNOSIS — Z992 Dependence on renal dialysis: Secondary | ICD-10-CM | POA: Diagnosis not present

## 2023-12-13 DIAGNOSIS — N186 End stage renal disease: Secondary | ICD-10-CM | POA: Diagnosis not present

## 2023-12-13 DIAGNOSIS — Z992 Dependence on renal dialysis: Secondary | ICD-10-CM | POA: Diagnosis not present

## 2023-12-14 DIAGNOSIS — Z992 Dependence on renal dialysis: Secondary | ICD-10-CM | POA: Diagnosis not present

## 2023-12-14 DIAGNOSIS — N186 End stage renal disease: Secondary | ICD-10-CM | POA: Diagnosis not present

## 2023-12-26 DIAGNOSIS — Z992 Dependence on renal dialysis: Secondary | ICD-10-CM | POA: Diagnosis not present

## 2024-01-02 DIAGNOSIS — Z992 Dependence on renal dialysis: Secondary | ICD-10-CM | POA: Diagnosis not present

## 2024-01-04 DIAGNOSIS — Z992 Dependence on renal dialysis: Secondary | ICD-10-CM | POA: Diagnosis not present

## 2024-01-07 DIAGNOSIS — Z992 Dependence on renal dialysis: Secondary | ICD-10-CM | POA: Diagnosis not present

## 2024-01-09 DIAGNOSIS — N186 End stage renal disease: Secondary | ICD-10-CM | POA: Diagnosis not present

## 2024-01-11 DIAGNOSIS — N186 End stage renal disease: Secondary | ICD-10-CM | POA: Diagnosis not present

## 2024-01-11 DIAGNOSIS — Z992 Dependence on renal dialysis: Secondary | ICD-10-CM | POA: Diagnosis not present

## 2024-01-12 DIAGNOSIS — N186 End stage renal disease: Secondary | ICD-10-CM | POA: Diagnosis not present

## 2024-01-12 DIAGNOSIS — Z992 Dependence on renal dialysis: Secondary | ICD-10-CM | POA: Diagnosis not present

## 2024-01-29 ENCOUNTER — Emergency Department

## 2024-01-29 ENCOUNTER — Inpatient Hospital Stay

## 2024-01-29 ENCOUNTER — Other Ambulatory Visit: Payer: Self-pay

## 2024-01-29 ENCOUNTER — Observation Stay

## 2024-01-29 ENCOUNTER — Inpatient Hospital Stay
Admission: EM | Admit: 2024-01-29 | Discharge: 2024-02-03 | DRG: 208 | Disposition: A | Discharge status: Home Health | Attending: Internal Medicine | Admitting: Internal Medicine

## 2024-01-29 DIAGNOSIS — J9622 Acute and chronic respiratory failure with hypercapnia: Secondary | ICD-10-CM | POA: Diagnosis present

## 2024-01-29 DIAGNOSIS — J90 Pleural effusion, not elsewhere classified: Secondary | ICD-10-CM | POA: Diagnosis not present

## 2024-01-29 DIAGNOSIS — Z7982 Long term (current) use of aspirin: Secondary | ICD-10-CM

## 2024-01-29 DIAGNOSIS — J441 Chronic obstructive pulmonary disease with (acute) exacerbation: Secondary | ICD-10-CM | POA: Diagnosis present

## 2024-01-29 DIAGNOSIS — J9612 Chronic respiratory failure with hypercapnia: Secondary | ICD-10-CM | POA: Diagnosis present

## 2024-01-29 DIAGNOSIS — J189 Pneumonia, unspecified organism: Principal | ICD-10-CM | POA: Diagnosis present

## 2024-01-29 DIAGNOSIS — D631 Anemia in chronic kidney disease: Secondary | ICD-10-CM | POA: Diagnosis present

## 2024-01-29 DIAGNOSIS — Z992 Dependence on renal dialysis: Secondary | ICD-10-CM

## 2024-01-29 DIAGNOSIS — I352 Nonrheumatic aortic (valve) stenosis with insufficiency: Secondary | ICD-10-CM | POA: Diagnosis present

## 2024-01-29 DIAGNOSIS — B348 Other viral infections of unspecified site: Secondary | ICD-10-CM | POA: Diagnosis not present

## 2024-01-29 DIAGNOSIS — L89152 Pressure ulcer of sacral region, stage 2: Secondary | ICD-10-CM | POA: Diagnosis present

## 2024-01-29 DIAGNOSIS — J9602 Acute respiratory failure with hypercapnia: Secondary | ICD-10-CM

## 2024-01-29 DIAGNOSIS — B1920 Unspecified viral hepatitis C without hepatic coma: Secondary | ICD-10-CM | POA: Diagnosis present

## 2024-01-29 DIAGNOSIS — I132 Hypertensive heart and chronic kidney disease with heart failure and with stage 5 chronic kidney disease, or end stage renal disease: Secondary | ICD-10-CM | POA: Diagnosis present

## 2024-01-29 DIAGNOSIS — Z79899 Other long term (current) drug therapy: Secondary | ICD-10-CM

## 2024-01-29 DIAGNOSIS — E8729 Other acidosis: Secondary | ICD-10-CM | POA: Diagnosis not present

## 2024-01-29 DIAGNOSIS — G9341 Metabolic encephalopathy: Secondary | ICD-10-CM | POA: Diagnosis present

## 2024-01-29 DIAGNOSIS — I5033 Acute on chronic diastolic (congestive) heart failure: Secondary | ICD-10-CM | POA: Diagnosis present

## 2024-01-29 DIAGNOSIS — B9789 Other viral agents as the cause of diseases classified elsewhere: Secondary | ICD-10-CM | POA: Diagnosis present

## 2024-01-29 DIAGNOSIS — G4733 Obstructive sleep apnea (adult) (pediatric): Secondary | ICD-10-CM | POA: Diagnosis present

## 2024-01-29 DIAGNOSIS — F17210 Nicotine dependence, cigarettes, uncomplicated: Secondary | ICD-10-CM | POA: Diagnosis present

## 2024-01-29 DIAGNOSIS — J9601 Acute respiratory failure with hypoxia: Secondary | ICD-10-CM

## 2024-01-29 DIAGNOSIS — N186 End stage renal disease: Secondary | ICD-10-CM | POA: Diagnosis present

## 2024-01-29 DIAGNOSIS — Z96653 Presence of artificial knee joint, bilateral: Secondary | ICD-10-CM | POA: Diagnosis present

## 2024-01-29 DIAGNOSIS — E875 Hyperkalemia: Secondary | ICD-10-CM | POA: Diagnosis present

## 2024-01-29 DIAGNOSIS — N2581 Secondary hyperparathyroidism of renal origin: Secondary | ICD-10-CM | POA: Diagnosis present

## 2024-01-29 DIAGNOSIS — L8990 Pressure ulcer of unspecified site, unspecified stage: Secondary | ICD-10-CM | POA: Insufficient documentation

## 2024-01-29 DIAGNOSIS — I5031 Acute diastolic (congestive) heart failure: Secondary | ICD-10-CM | POA: Diagnosis present

## 2024-01-29 DIAGNOSIS — I10 Essential (primary) hypertension: Secondary | ICD-10-CM | POA: Diagnosis present

## 2024-01-29 DIAGNOSIS — Z91158 Patient's noncompliance with renal dialysis for other reason: Secondary | ICD-10-CM

## 2024-01-29 DIAGNOSIS — M109 Gout, unspecified: Secondary | ICD-10-CM | POA: Diagnosis present

## 2024-01-29 DIAGNOSIS — Z1152 Encounter for screening for COVID-19: Secondary | ICD-10-CM | POA: Diagnosis not present

## 2024-01-29 DIAGNOSIS — E785 Hyperlipidemia, unspecified: Secondary | ICD-10-CM | POA: Diagnosis present

## 2024-01-29 DIAGNOSIS — Z9981 Dependence on supplemental oxygen: Secondary | ICD-10-CM | POA: Diagnosis not present

## 2024-01-29 DIAGNOSIS — J188 Other pneumonia, unspecified organism: Secondary | ICD-10-CM | POA: Diagnosis not present

## 2024-01-29 DIAGNOSIS — Z880 Allergy status to penicillin: Secondary | ICD-10-CM

## 2024-01-29 DIAGNOSIS — J9621 Acute and chronic respiratory failure with hypoxia: Secondary | ICD-10-CM | POA: Diagnosis present

## 2024-01-29 DIAGNOSIS — E790 Hyperuricemia without signs of inflammatory arthritis and tophaceous disease: Secondary | ICD-10-CM | POA: Diagnosis present

## 2024-01-29 DIAGNOSIS — B974 Respiratory syncytial virus as the cause of diseases classified elsewhere: Secondary | ICD-10-CM | POA: Diagnosis present

## 2024-01-29 DIAGNOSIS — I509 Heart failure, unspecified: Secondary | ICD-10-CM

## 2024-01-29 DIAGNOSIS — J440 Chronic obstructive pulmonary disease with acute lower respiratory infection: Secondary | ICD-10-CM | POA: Diagnosis present

## 2024-01-29 LAB — URINALYSIS, W/ REFLEX TO CULTURE (INFECTION SUSPECTED)
Bilirubin Urine: NEGATIVE
Glucose, UA: NEGATIVE mg/dL
Ketones, ur: NEGATIVE mg/dL
Nitrite: NEGATIVE
Protein, ur: 100 mg/dL — AB
RBC / HPF: >50 RBC/hpf (ref 0–5)
Specific Gravity, Urine: 1.014 (ref 1.005–1.030)
pH: 5 (ref 5.0–8.0)

## 2024-01-29 LAB — CBC WITH DIFFERENTIAL/PLATELET
Abs Immature Granulocytes: 0.1 K/uL — ABNORMAL HIGH (ref 0.00–0.07)
Basophils Absolute: 0 K/uL (ref 0.0–0.1)
Basophils Relative: 0 %
Eosinophils Absolute: 0 K/uL (ref 0.0–0.5)
Eosinophils Relative: 0 %
HCT: 36.3 % (ref 36.0–46.0)
Hemoglobin: 10.8 g/dL — ABNORMAL LOW (ref 12.0–15.0)
Immature Granulocytes: 1 %
Lymphocytes Relative: 6 %
Lymphs Abs: 0.6 K/uL — ABNORMAL LOW (ref 0.7–4.0)
MCH: 29.9 pg (ref 26.0–34.0)
MCHC: 29.8 g/dL — ABNORMAL LOW (ref 30.0–36.0)
MCV: 100.6 fL — ABNORMAL HIGH (ref 80.0–100.0)
Monocytes Absolute: 0.6 K/uL (ref 0.1–1.0)
Monocytes Relative: 6 %
Neutro Abs: 8.9 K/uL — ABNORMAL HIGH (ref 1.7–7.7)
Neutrophils Relative %: 87 %
Platelets: 170 K/uL (ref 150–400)
RBC: 3.61 MIL/uL — ABNORMAL LOW (ref 3.87–5.11)
RDW: 17 % — ABNORMAL HIGH (ref 11.5–15.5)
WBC: 10.2 K/uL (ref 4.0–10.5)
nRBC: 0.4 % — ABNORMAL HIGH (ref 0.0–0.2)

## 2024-01-29 LAB — BLOOD GAS, ARTERIAL
Acid-base deficit: 9.2 mmol/L — ABNORMAL HIGH (ref 0.0–2.0)
Acid-base deficit: 4.3 mmol/L — ABNORMAL HIGH (ref 0.0–2.0)
Bicarbonate: 24 mmol/L (ref 20.0–28.0)
Bicarbonate: 23.3 mmol/L (ref 20.0–28.0)
FIO2: 40 %
MECHVT: 450 mL
Mechanical Rate: 22
O2 Saturation: 100 %
O2 Saturation: 98.7 %
PEEP: 5 cmH2O
Patient temperature: 37
Patient temperature: 37
pCO2 arterial: 95 mmHg (ref 32–48)
pCO2 arterial: 52 mmHg — ABNORMAL HIGH (ref 32–48)
pH, Arterial: 7.01 — CL (ref 7.35–7.45)
pH, Arterial: 7.26 — ABNORMAL LOW (ref 7.35–7.45)
pO2, Arterial: 213 mmHg — ABNORMAL HIGH (ref 83–108)
pO2, Arterial: 103 mmHg (ref 83–108)

## 2024-01-29 LAB — RESP PANEL BY RT-PCR (RSV, FLU A&B, COVID)  RVPGX2
Influenza A by PCR: NEGATIVE
Influenza B by PCR: NEGATIVE
Resp Syncytial Virus by PCR: NEGATIVE
SARS Coronavirus 2 by RT PCR: NEGATIVE

## 2024-01-29 LAB — RESPIRATORY PANEL BY PCR

## 2024-01-29 LAB — CREATININE, SERUM
Creatinine, Ser: 9.51 mg/dL — ABNORMAL HIGH (ref 0.44–1.00)
GFR, Estimated: 4 mL/min — ABNORMAL LOW (ref >60)

## 2024-01-29 LAB — CBC
HCT: 35.5 % — ABNORMAL LOW (ref 36.0–46.0)
Hemoglobin: 10.1 g/dL — ABNORMAL LOW (ref 12.0–15.0)
MCH: 30 pg (ref 26.0–34.0)
MCHC: 28.5 g/dL — ABNORMAL LOW (ref 30.0–36.0)
MCV: 105.3 fL — ABNORMAL HIGH (ref 80.0–100.0)
Platelets: 164 K/uL (ref 150–400)
RBC: 3.37 MIL/uL — ABNORMAL LOW (ref 3.87–5.11)
RDW: 16.9 % — ABNORMAL HIGH (ref 11.5–15.5)
WBC: 10.4 K/uL (ref 4.0–10.5)
nRBC: 0.7 % — ABNORMAL HIGH (ref 0.0–0.2)

## 2024-01-29 LAB — COMPREHENSIVE METABOLIC PANEL WITH GFR
ALT: 25 U/L (ref 0–44)
AST: 31 U/L (ref 15–41)
Albumin: 4.1 g/dL (ref 3.5–5.0)
Alkaline Phosphatase: 99 U/L (ref 38–126)
Anion gap: 15 (ref 5–15)
BUN: 69 mg/dL — ABNORMAL HIGH (ref 8–23)
CO2: 24 mmol/L (ref 22–32)
Calcium: 8.2 mg/dL — ABNORMAL LOW (ref 8.9–10.3)
Chloride: 103 mmol/L (ref 98–111)
Creatinine, Ser: 9.37 mg/dL — ABNORMAL HIGH (ref 0.44–1.00)
GFR, Estimated: 4 mL/min — ABNORMAL LOW (ref >60)
Glucose, Bld: 114 mg/dL — ABNORMAL HIGH (ref 70–99)
Potassium: 5.2 mmol/L — ABNORMAL HIGH (ref 3.5–5.1)
Sodium: 141 mmol/L (ref 135–145)
Total Bilirubin: 0.4 mg/dL (ref 0.0–1.2)
Total Protein: 7.6 g/dL (ref 6.5–8.1)

## 2024-01-29 LAB — CBG MONITORING, ED: Glucose-Capillary: 130 mg/dL — ABNORMAL HIGH (ref 70–99)

## 2024-01-29 LAB — GLUCOSE, CAPILLARY: Glucose-Capillary: 114 mg/dL — ABNORMAL HIGH (ref 70–99)

## 2024-01-29 LAB — PRO BRAIN NATRIURETIC PEPTIDE: Pro Brain Natriuretic Peptide: >35000 pg/mL — ABNORMAL HIGH (ref <300.0)

## 2024-01-29 LAB — LACTIC ACID, PLASMA: Lactic Acid, Venous: 0.9 mmol/L (ref 0.5–1.9)

## 2024-01-29 LAB — MRSA NEXT GEN BY PCR, NASAL: MRSA by PCR Next Gen: NOT DETECTED

## 2024-01-29 LAB — PROCALCITONIN: Procalcitonin: 0.48 ng/mL

## 2024-01-29 LAB — HEPATITIS B SURFACE ANTIGEN: Hepatitis B Surface Ag: NONREACTIVE

## 2024-01-29 LAB — AMMONIA: Ammonia: 50 umol/L — ABNORMAL HIGH (ref 9–35)

## 2024-01-29 LAB — PROTIME-INR
INR: 1.1 (ref 0.8–1.2)
Prothrombin Time: 14.4 s (ref 11.4–15.2)

## 2024-01-29 MED ORDER — METHYLPREDNISOLONE SODIUM SUCC 40 MG IJ SOLR
40.0000 mg | Freq: Every day | INTRAMUSCULAR | Status: DC
  Administered 2024-01-29 – 2024-02-02 (×5): 40 mg via INTRAVENOUS
  Filled 2024-01-29 (×5): qty 1

## 2024-01-29 MED ORDER — FENTANYL BOLUS VIA INFUSION
25.0000 ug | INTRAVENOUS | Status: DC | PRN
  Administered 2024-01-29 (×2): 50 ug via INTRAVENOUS
  Administered 2024-01-30: 03:00:00 100 ug via INTRAVENOUS
  Administered 2024-01-30: 06:00:00 75 ug via INTRAVENOUS

## 2024-01-29 MED ORDER — ASPIRIN 325 MG PO TBEC
325.0000 mg | DELAYED_RELEASE_TABLET | Freq: Every day | ORAL | Status: DC
  Administered 2024-01-30 – 2024-02-03 (×5): 325 mg via ORAL
  Filled 2024-01-29 (×5): qty 1

## 2024-01-29 MED ORDER — POLYETHYLENE GLYCOL 3350 17 G PO PACK: 17.0000 g | PACK | Freq: Every day | ORAL | Status: DC | PRN

## 2024-01-29 MED ORDER — IPRATROPIUM-ALBUTEROL 0.5-2.5 (3) MG/3ML IN SOLN
3.0000 mL | Freq: Once | RESPIRATORY_TRACT | Status: AC
  Administered 2024-01-29: 08:00:00 3 mL via RESPIRATORY_TRACT
  Filled 2024-01-29: qty 3

## 2024-01-29 MED ORDER — AMLODIPINE BESYLATE 10 MG PO TABS: 10.0000 mg | ORAL_TABLET | Freq: Every day | ORAL | Status: DC

## 2024-01-29 MED ORDER — ORAL CARE MOUTH RINSE
15.0000 mL | OROMUCOSAL | Status: DC
  Administered 2024-01-29 – 2024-01-31 (×18): 15 mL via OROMUCOSAL

## 2024-01-29 MED ORDER — CHLORHEXIDINE GLUCONATE CLOTH 2 % EX PADS
6.0000 | MEDICATED_PAD | Freq: Every day | CUTANEOUS | Status: DC
  Administered 2024-01-29 – 2024-02-03 (×5): 6 via TOPICAL
  Filled 2024-01-29: qty 6

## 2024-01-29 MED ORDER — ORAL CARE MOUTH RINSE: 15.0000 mL | OROMUCOSAL | Status: DC | PRN

## 2024-01-29 MED ORDER — ONDANSETRON HCL 4 MG PO TABS: 4.0000 mg | ORAL_TABLET | Freq: Four times a day (QID) | ORAL | Status: DC | PRN

## 2024-01-29 MED ORDER — PROPOFOL 1000 MG/100ML IV EMUL
0.0000 ug/kg/min | INTRAVENOUS | Status: DC
  Administered 2024-01-29: 22:00:00 20 ug/kg/min via INTRAVENOUS
  Administered 2024-01-30: 06:00:00 30 ug/kg/min via INTRAVENOUS
  Filled 2024-01-29 (×2): qty 100

## 2024-01-29 MED ORDER — ONDANSETRON HCL 4 MG/2ML IJ SOLN: 4.0000 mg | Freq: Four times a day (QID) | INTRAMUSCULAR | Status: DC | PRN

## 2024-01-29 MED ORDER — DOCUSATE SODIUM 50 MG/5ML PO LIQD
100.0000 mg | Freq: Two times a day (BID) | ORAL | Status: DC
  Administered 2024-01-29 – 2024-01-30 (×2): 100 mg
  Filled 2024-01-29 (×2): qty 10

## 2024-01-29 MED ORDER — ETOMIDATE 2 MG/ML IV SOLN
INTRAVENOUS | Status: AC | PRN
  Administered 2024-01-29: 14:00:00 20 mg via INTRAVENOUS

## 2024-01-29 MED ORDER — FUROSEMIDE 10 MG/ML IJ SOLN
80.0000 mg | Freq: Once | INTRAMUSCULAR | Status: AC
  Administered 2024-01-29: 09:00:00 80 mg via INTRAVENOUS
  Filled 2024-01-29: qty 8

## 2024-01-29 MED ORDER — SUCCINYLCHOLINE CHLORIDE 20 MG/ML IJ SOLN
INTRAMUSCULAR | Status: AC | PRN
  Administered 2024-01-29: 14:00:00 70 mg via INTRAVENOUS

## 2024-01-29 MED ORDER — IPRATROPIUM-ALBUTEROL 0.5-2.5 (3) MG/3ML IN SOLN
3.0000 mL | Freq: Four times a day (QID) | RESPIRATORY_TRACT | Status: DC
  Administered 2024-01-29 – 2024-01-31 (×9): 3 mL via RESPIRATORY_TRACT
  Filled 2024-01-29 (×9): qty 3

## 2024-01-29 MED ORDER — ETOMIDATE 2 MG/ML IV SOLN
INTRAVENOUS | Status: AC
  Filled 2024-01-29: qty 10

## 2024-01-29 MED ORDER — PANTOPRAZOLE SODIUM 40 MG IV SOLR
40.0000 mg | Freq: Every day | INTRAVENOUS | Status: DC
  Administered 2024-01-29 – 2024-01-30 (×2): 40 mg via INTRAVENOUS
  Filled 2024-01-29 (×2): qty 10

## 2024-01-29 MED ORDER — NOREPINEPHRINE 4 MG/250ML-% IV SOLN
0.0000 ug/min | INTRAVENOUS | Status: DC
  Administered 2024-01-29: 22:00:00 1 ug/min via INTRAVENOUS
  Filled 2024-01-29: qty 250

## 2024-01-29 MED ORDER — ACETAMINOPHEN 325 MG PO TABS
975.0000 mg | ORAL_TABLET | Freq: Once | ORAL | Status: AC
  Administered 2024-01-29: 08:00:00 975 mg via ORAL
  Filled 2024-01-29: qty 3

## 2024-01-29 MED ORDER — MIDAZOLAM HCL (PF) 2 MG/2ML IJ SOLN
1.0000 mg | INTRAMUSCULAR | Status: DC | PRN
  Administered 2024-01-29 (×2): 2 mg via INTRAVENOUS
  Filled 2024-01-29 (×2): qty 2

## 2024-01-29 MED ORDER — POLYETHYLENE GLYCOL 3350 17 G PO PACK
17.0000 g | PACK | Freq: Every day | ORAL | Status: DC
  Filled 2024-01-29: qty 1

## 2024-01-29 MED ORDER — LEVOFLOXACIN IN D5W 750 MG/150ML IV SOLN
750.0000 mg | Freq: Once | INTRAVENOUS | Status: AC
  Administered 2024-01-29: 09:00:00 750 mg via INTRAVENOUS
  Filled 2024-01-29: qty 150

## 2024-01-29 MED ORDER — SODIUM CHLORIDE 0.9 % IV BOLUS
500.0000 mL | Freq: Once | INTRAVENOUS | Status: AC
  Administered 2024-01-29: 08:00:00 500 mL via INTRAVENOUS

## 2024-01-29 MED ORDER — SODIUM CHLORIDE 0.9 % IV SOLN
250.0000 mL | INTRAVENOUS | Status: AC
  Administered 2024-01-30: 11:00:00 250 mL via INTRAVENOUS

## 2024-01-29 MED ORDER — ACETAMINOPHEN 650 MG RE SUPP: 650.0000 mg | Freq: Four times a day (QID) | RECTAL | Status: DC | PRN

## 2024-01-29 MED ORDER — PROPOFOL 1000 MG/100ML IV EMUL
INTRAVENOUS | Status: AC
  Administered 2024-01-29: 14:00:00 5 ug/kg/min via INTRAVENOUS
  Filled 2024-01-29: qty 100

## 2024-01-29 MED ORDER — LOSARTAN POTASSIUM 50 MG PO TABS: 25.0000 mg | ORAL_TABLET | Freq: Every day | ORAL | Status: DC

## 2024-01-29 MED ORDER — ACETAMINOPHEN 325 MG PO TABS: 650.0000 mg | ORAL_TABLET | Freq: Four times a day (QID) | ORAL | Status: DC | PRN

## 2024-01-29 MED ORDER — SUCCINYLCHOLINE CHLORIDE 200 MG/10ML IV SOSY
PREFILLED_SYRINGE | INTRAVENOUS | Status: AC
  Filled 2024-01-29: qty 10

## 2024-01-29 MED ORDER — ONDANSETRON HCL 4 MG/2ML IJ SOLN
4.0000 mg | Freq: Once | INTRAMUSCULAR | Status: AC
  Administered 2024-01-29: 08:00:00 4 mg via INTRAVENOUS
  Filled 2024-01-29: qty 2

## 2024-01-29 MED ORDER — FENTANYL 2500MCG IN NS 250ML (10MCG/ML) PREMIX INFUSION
0.0000 ug/h | INTRAVENOUS | Status: DC
  Administered 2024-01-29: 15:00:00 25 ug/h via INTRAVENOUS
  Filled 2024-01-29: qty 250

## 2024-01-29 MED ORDER — HEPARIN SODIUM (PORCINE) 5000 UNIT/ML IJ SOLN
5000.0000 [IU] | Freq: Three times a day (TID) | INTRAMUSCULAR | Status: DC
  Administered 2024-01-29 – 2024-02-03 (×14): 5000 [IU] via SUBCUTANEOUS
  Filled 2024-01-29 (×14): qty 1

## 2024-01-29 MED ORDER — METOPROLOL SUCCINATE ER 50 MG PO TB24: 200.0000 mg | ORAL_TABLET | Freq: Every morning | ORAL | Status: DC

## 2024-01-29 MED ORDER — SODIUM CHLORIDE 0.9 % IV SOLN
500.0000 mg | INTRAVENOUS | Status: AC
  Administered 2024-01-30 – 2024-02-03 (×5): 500 mg via INTRAVENOUS
  Filled 2024-01-29 (×5): qty 5

## 2024-01-29 MED ORDER — SODIUM CHLORIDE 0.9 % IV SOLN
2.0000 g | INTRAVENOUS | Status: AC
  Administered 2024-01-30 – 2024-02-03 (×5): 2 g via INTRAVENOUS
  Filled 2024-01-29 (×5): qty 20

## 2024-01-29 MED ORDER — ISOSORBIDE MONONITRATE ER 60 MG PO TB24: 60.0000 mg | ORAL_TABLET | Freq: Every morning | ORAL | Status: DC

## 2024-01-29 NOTE — ED Notes (Signed)
 Pt dropped to 83% o2 on BIPAP. Rt called.

## 2024-01-29 NOTE — ED Notes (Signed)
This RN gave report to ICU RN

## 2024-01-29 NOTE — ED Notes (Signed)
 Attending at bedside.

## 2024-01-29 NOTE — ED Notes (Signed)
 RT at bedside to place pt on bipap

## 2024-01-29 NOTE — ED Provider Notes (Addendum)
 [facility] Provider Note    Event Date/Time   First MD Initiated Contact with Patient 01/29/24 [phone]     (approximate)   History   Shortness of Breath   HPI  Anna Key is a 73 y.o. female with history of end-stage renal disease presenting today with concern of altered mental status, weakness, increased shortness of breath.  Found by EMS apparently hypoxic.  Seemingly is supposed to be on 2 L nasal cannula at baseline, was noted to be on room air at about 80% of the time with some increased work of breathing.  Brought in for evaluation.  Unfortunately the patient is a poor historian and limited in regards to giving me much further information.  When asking what is wrong   she states I do not know, also unable to tell me when her last dialysis session was or much else in regards to who she lives with her or who may have called EMS today.  I reviewed her past medical history and her chart.     Physical Exam   Triage Vital Signs: ED Triage Vitals  Encounter Vitals Group     BP      Girls Systolic BP Percentile      Girls Diastolic BP Percentile      Boys Systolic BP Percentile      Boys Diastolic BP Percentile      Pulse      Resp      Temp      Temp src      SpO2      Weight      Height      Head Circumference      Peak Flow      Pain Score      Pain Loc      Pain Education      Exclude from Growth Chart     Most recent vital signs: Vitals:   01/29/24 1445 01/29/24 1514  BP: 106/67   Pulse: (!) 58   Resp: (!) 22   Temp: (!) 93.8 F (34.3 C)   SpO2:  99%     General: Awake, no distress.  CV:  Good peripheral perfusion.  No significant lower extremity swelling Resp:  Normal effort.  Able to speak full sentences, tachycardic, diminished breath sounds, with rhonchi and wheezes noted in the bilateral lower lobes Abd:  No distention.  Soft nontender Other:     ED Results / Procedures / Treatments   Labs (all labs ordered  are listed, but only abnormal results are displayed) Labs Reviewed  COMPREHENSIVE METABOLIC PANEL WITH GFR - Abnormal; Notable for the following components:      Result Value   Potassium 5.2 (*)    Glucose, Bld 114 (*)    BUN 69 (*)    Creatinine, Ser 9.37 (*)    Calcium  8.2 (*)    GFR, Estimated 4 (*)    All other components within normal limits  CBC WITH DIFFERENTIAL/PLATELET - Abnormal; Notable for the following components:   RBC 3.61 (*)    Hemoglobin 10.8 (*)    MCV 100.6 (*)    MCHC 29.8 (*)    RDW 17.0 (*)    nRBC 0.4 (*)    Neutro Abs 8.9 (*)    Lymphs Abs 0.6 (*)    Abs Immature Granulocytes 0.10 (*)    All other components within normal limits  PRO BRAIN NATRIURETIC PEPTIDE - Abnormal; Notable for the  following components:   Pro Brain Natriuretic Peptide >35,000.0 (*)    All other components within normal limits  CBC - Abnormal; Notable for the following components:   RBC 3.37 (*)    Hemoglobin 10.1 (*)    HCT 35.5 (*)    MCV 105.3 (*)    MCHC 28.5 (*)    RDW 16.9 (*)    nRBC 0.7 (*)    All other components within normal limits  CREATININE, SERUM - Abnormal; Notable for the following components:   Creatinine, Ser 9.51 (*)    GFR, Estimated 4 (*)    All other components within normal limits  AMMONIA - Abnormal; Notable for the following components:   Ammonia 50 (*)    All other components within normal limits  BLOOD GAS, ARTERIAL - Abnormal; Notable for the following components:   pH, Arterial 7.01 (*)    pCO2 arterial 95 (*)    pO2, Arterial 213 (*)    Acid-base deficit 9.2 (*)    All other components within normal limits  BLOOD GAS, ARTERIAL - Abnormal; Notable for the following components:   pH, Arterial 7.26 (*)    pCO2 arterial 52 (*)    Acid-base deficit 4.3 (*)    All other components within normal limits  CBG MONITORING, ED - Abnormal; Notable for the following components:   Glucose-Capillary 130 (*)    All other components within normal limits   RESP PANEL BY RT-PCR (RSV, FLU A&B, COVID)  RVPGX2  CULTURE, BLOOD (ROUTINE X 2)  CULTURE, BLOOD (ROUTINE X 2)  EXPECTORATED SPUTUM ASSESSMENT W GRAM STAIN, RFLX TO RESP C  MRSA NEXT GEN BY PCR, NASAL  CULTURE, RESPIRATORY W GRAM STAIN  RESPIRATORY PANEL BY PCR  LACTIC ACID, PLASMA  PROTIME-INR  PROCALCITONIN  URINALYSIS, W/ REFLEX TO CULTURE (INFECTION SUSPECTED)  HEPATITIS B SURFACE ANTIGEN  HEPATITIS B SURFACE ANTIBODY, QUANTITATIVE  STREP PNEUMONIAE URINARY ANTIGEN  LEGIONELLA PNEUMOPHILA SEROGP 1 UR AG     EKG  Sinus rhythm with rate of about 75, axis of about 30, intervals appear to be within normal limits, no obvious ischemia   RADIOLOGY On my independent interpretation when compared to the prior chest x-ray appears to demonstrate a right middle lobe pneumonia.  PROCEDURES:  Critical Care performed: Yes, see critical care procedure note(s) CRITICAL CARE Performed by: Rossie CHRISTELLA Bathe   Total critical care time: 45 minutes  Critical care time was exclusive of separately billable procedures and treating other patients.  Critical care was necessary to treat or prevent imminent or life-threatening deterioration.  Critical care was time spent personally by me on the following activities: development of treatment plan with patient and/or surrogate as well as nursing, discussions with consultants, evaluation of patient's response to treatment, examination of patient, obtaining history from patient or surrogate, ordering and performing treatments and interventions, ordering and review of laboratory studies, ordering and review of radiographic studies, pulse oximetry and re-evaluation of patient's condition.   Procedure Name: Intubation Date/Time: 01/29/2024 2:04 PM  Performed by: Bathe Rossie CHRISTELLA, MDPre-anesthesia Checklist: Patient identified, Emergency Drugs available, Suction available, Timeout performed and Patient being monitored Oxygen  Delivery Method: Ambu  bag Preoxygenation: Pre-oxygenation with 100% oxygen  Induction Type: Rapid sequence Ventilation: Mask ventilation without difficulty Laryngoscope Size: Glidescope and 4 Grade View: Grade I Tube size: 7.5 mm Number of attempts: 1 Airway Equipment and Method: Stylet and Video-laryngoscopy Placement Confirmation: ETT inserted through vocal cords under direct vision, Positive ETCO2, CO2 detector and Breath sounds checked-  equal and bilateral Tube secured with: ETT holder Future Recommendations: Recommend- induction with short-acting agent, and alternative techniques readily available       MEDICATIONS ORDERED IN ED: Medications  heparin  injection 5,000 Units (has no administration in time range)  polyethylene glycol (MIRALAX  / GLYCOLAX ) packet 17 g (has no administration in time range)  ondansetron  (ZOFRAN ) tablet 4 mg (has no administration in time range)    Or  ondansetron  (ZOFRAN ) injection 4 mg (has no administration in time range)  acetaminophen  (TYLENOL ) tablet 650 mg (has no administration in time range)    Or  acetaminophen  (TYLENOL ) suppository 650 mg (has no administration in time range)  Chlorhexidine  Gluconate Cloth 2 % PADS 6 each (has no administration in time range)  cefTRIAXone  (ROCEPHIN ) 2 g in sodium chloride  0.9 % 100 mL IVPB (has no administration in time range)  azithromycin  (ZITHROMAX ) 500 mg in sodium chloride  0.9 % 250 mL IVPB (has no administration in time range)  amLODipine  (NORVASC ) tablet 10 mg (0 mg Oral Hold 01/29/24 1220)  aspirin  EC tablet 325 mg (325 mg Oral Not Given 01/29/24 1325)  succinylcholine  (ANECTINE ) 200 MG/10ML syringe (has no administration in time range)  etomidate  (AMIDATE ) 2 MG/ML injection (has no administration in time range)  propofol  (DIPRIVAN ) 1000 MG/100ML infusion (30 mcg/kg/min  72.2 kg Intravenous Rate/Dose Change 01/29/24 1422)  docusate (COLACE) 50 MG/5ML liquid 100 mg (has no administration in time range)  polyethylene  glycol (MIRALAX  / GLYCOLAX ) packet 17 g (has no administration in time range)  fentaNYL  in NS (27mcg/ml) infusion-PREMIX (25 mcg/hr Intravenous New Bag/Given 01/29/24 1431)  fentaNYL  (SUBLIMAZE ) bolus via infusion 25-100 mcg (has no administration in time range)  midazolam  PF (VERSED ) injection 1-2 mg (2 mg Intravenous Given 01/29/24 1425)  pantoprazole  (PROTONIX ) injection 40 mg (has no administration in time range)  ipratropium-albuterol  (DUONEB) 0.5-2.5 (3) MG/3ML nebulizer solution 3 mL (3 mLs Nebulization Given 01/29/24 1513)  methylPREDNISolone  sodium succinate (SOLU-MEDROL ) 40 mg/mL injection 40 mg (has no administration in time range)  ondansetron  (ZOFRAN ) injection 4 mg (4 mg Intravenous Given 01/29/24 0745)  ipratropium-albuterol  (DUONEB) 0.5-2.5 (3) MG/3ML nebulizer solution 3 mL (3 mLs Nebulization Given 01/29/24 0745)  sodium chloride  0.9 % bolus 500 mL (0 mLs Intravenous Stopped 01/29/24 0835)  acetaminophen  (TYLENOL ) tablet 975 mg (975 mg Oral Given 01/29/24 0743)  levofloxacin  (LEVAQUIN ) IVPB 750 mg (0 mg Intravenous Stopped 01/29/24 1005)  furosemide  (LASIX ) injection 80 mg (80 mg Intravenous Given 01/29/24 0834)  etomidate  (AMIDATE ) injection (20 mg Intravenous Given 01/29/24 1358)  succinylcholine  (ANECTINE ) injection (70 mg Intravenous Given 01/29/24 1358)     IMPRESSION / MDM / ASSESSMENT AND PLAN / ED COURSE  I reviewed the triage vital signs and the nursing notes.                               Patient's presentation is most consistent with acute presentation with potential threat to life or bodily function.  73 year old female who presents today with concern of increased shortness of breath.  On exam productive cough, unable to provide me with much history.  Poor air movement in lower lobe wheezes and rhonchi are appreciated.  Afebrile here, hypertensive and increased oxygen  requirement.  Suspect underlying sepsis versus possibly COPD exacerbation versus  possibly other etiology.  Will initiate septic workup.  Appears euvolemic at this time will provide gentle hydration she also does have the ESRD history.   Clinical Course  as of 01/29/24 1531  Wed Jan 29, 2024  0819 Patient with slight hyperkalemia, no noted leukocytosis, lactic acid is also normal, BNP is significantly elevated.  Given the findings and the increased oxygen  requirement likely will warrant dialysis, given her altered mental status possibly secondary to hyper uremia although I feel not significant enough to explain her confusion.  Will discuss with medicine for admission. [SK]  9172 Spoke with Dr. Marcelino from nephrology who will arrange for dialysis today. [SK]  [id number] Spoke with Dr. Paudel from medicine team who has agreed to evaluate the patient to determine course of further medical management at this time. [SK]  1300 While waiting for bed placement, patient began decompensating, I ordered an ABG as well as a chest x-ray to reassess, ABG demonstrates significant hypercapnia.  Sister now at bedside, unfortunately husband has a history of dementia so sister is her primary decision-maker at this time.  Discussed with sister goals of care, sister would like her to be full code at this time.  Will do trial of BiPAP, but if BiPAP is unsuccessful or patient has poor tolerance we will need intubation. [SK]  1349 Unfortunately patient with poor tolerance, and now noted desaturations while on the BiPAP, plan for intubation at this time. [SK]  1530 Discussed with Kasa from the ICU team, who came by to evaluate and assess the patient. [SK]    Clinical Course User Index [SK] Fernand Rossie HERO, MD     FINAL CLINICAL IMPRESSION(S) / ED DIAGNOSES   Final diagnoses:  Community acquired pneumonia of right middle lobe of lung  Hyperuricemia  ESRD (end stage renal disease) (HCC)  Acute respiratory failure with hypercapnia (HCC)     Rx / DC Orders   ED Discharge Orders     None         Note:  This document was prepared using Dragon voice recognition software and may include unintentional dictation errors.   Fernand Rossie HERO, MD 01/29/24 1134    Fernand Rossie HERO, MD 01/29/24 [id number]

## 2024-01-29 NOTE — H&P (Signed)
 History and Physical    Anna Key FMW:[id number] DOB: [birth year] DOA: 01/29/2024  DOS: the patient was seen and examined on 01/29/2024  PCP: Lenon Layman ORN, MD   Patient coming from: Home  I have personally briefly reviewed patient's old medical records in [facility]  Chief Complaint: Cough shortness of breath and confusion  HPI: Anna Key is a pleasant 73 y.o. female with medical history significant for COPD on home oxygen  2 L via nasal cannula, ESRD on hemodialysis, anemia, CHF, hep C, HTN, HLD who was brought into ED for concern for shortness of breath, cough and confusion.  EMS found her hypoxic, saturating 82% on room air.  Patient reported productive cough for few days, audible expiratory wheezing. Blood pressure was 182/88 and heart rate was 79. Patient is confused and sleepy not able to provide meaningful history.  I called patient sister-in-law who is listed as contact person and did not know why patient was brought into the hospital.  Patient's husband also listed as contact who is not able to provide meaningful history.  ED Course: Upon arrival to the ED, patient is found to be hypoxic at 80% on room air, blood pressure 119/49, potassium 5.2, BUN 69, creatinine 9.37, WBC 10.2, chest x-ray showed bilateral infiltrate and mild pleural effusion concerning for congestive changes.  ABG showed pH of 7.01, pCO2 95, pO2 213.  Patient was given levofloxacin  for possible community-acquired pneumonia, dose of Lasix  80 mg IV, blood cultures were sent, urine analysis pending, hospitalist service was consulted for evaluation for admission.  Review of Systems:  ROS  All other systems negative except as noted in the HPI.  Past Medical History:  Diagnosis Date   Anemia    Arthritis    osteoarthritis of right knee   CHF (congestive heart failure) (HCC)    Chronic kidney disease    stage 3   Gout    Hepatitis C antibody test positive    Hyperlipidemia     Hypertension     Past Surgical History:  Procedure Laterality Date   A/V FISTULAGRAM N/A 09/22/2020   Procedure: A/V Fistulagram;  Surgeon: Marea Selinda RAMAN, MD;  Location: [facility];  Service: Cardiovascular;  Laterality: N/A;   AV FISTULA PLACEMENT Left 11/27/2019   Procedure: ARTERIOVENOUS (AV) Brachiocephalic FISTULA CREATION, possible graft;  Surgeon: Jama Cordella MATSU, MD;  Location: [facility] OR[facility];  Service: Vascular;  Laterality: Left;   CARDIAC CATHETERIZATION     COLONOSCOPY WITH PROPOFOL  N/A 03/22/2021   Procedure: COLONOSCOPY WITH PROPOFOL ;  Surgeon: Toledo, Ladell POUR, MD;  Location: [facility];  Service: Gastroenterology;  Laterality: N/A;   DIAGNOSTIC LAPAROSCOPY     ESOPHAGOGASTRODUODENOSCOPY N/A 03/22/2021   Procedure: ESOPHAGOGASTRODUODENOSCOPY (EGD);  Surgeon: Toledo, Ladell POUR, MD;  Location: [facility];  Service: Gastroenterology;  Laterality: N/A;   LAPAROSCOPIC LYSIS OF ADHESIONS  08/12/2019   Procedure: LAPAROSCOPIC LYSIS OF ADHESIONS;  Surgeon: Jordis Laneta FALCON, MD;  Location: [facility] OR[facility];  Service: General;;   PARTIAL KNEE ARTHROPLASTY Right 06/11/2017   Procedure: UNICOMPARTMENTAL KNEE;  Surgeon: Edie Norleen PARAS, MD;  Location: [facility];  Service: Orthopedics;  Laterality: Right;   REPLACEMENT TOTAL KNEE Left 2012   TEMPORARY DIALYSIS CATHETER  09/20/2020   Procedure: TEMPORARY DIALYSIS CATHETER;  Surgeon: Jama Cordella MATSU, MD;  Location: [facility];  Service: Cardiovascular;;     reports that she has been smoking cigarettes. She has never used smokeless tobacco. She reports that she does not currently use  drugs. She reports that she does not drink alcohol.  Allergies[1]  Family History  Problem Relation Age of Onset   Colon cancer Mother     Prior to Admission medications  Medication Sig Start Date End Date Taking? Authorizing Provider  allopurinol  (ZYLOPRIM ) 100 MG tablet Take 100 mg by mouth every morning.    Yes [provider]   amLODipine  (NORVASC ) 10 MG tablet Take 1 tablet (10 mg total) by mouth daily. 09/24/20 01/29/24 Yes Dahal, Chapman, MD  aspirin  EC 325 MG EC tablet Take 1 tablet (325 mg total) by mouth daily. 09/25/20  Yes Dahal, Chapman, MD  isosorbide  mononitrate (IMDUR ) 60 MG 24 hr tablet Take 1 tablet (60 mg total) by mouth every morning. 09/24/20 01/29/24 Yes Dahal, Chapman, MD  metoprolol  (TOPROL -XL) 200 MG 24 hr tablet Take 1 tablet (200 mg total) by mouth every morning. 09/24/20 01/29/24 Yes Dahal, Chapman, MD  atorvastatin  (LIPITOR) 40 MG tablet Take 40 mg by mouth every morning.  Patient not taking: Reported on 01/29/2024    [provider]  calcitRIOL  (ROCALTROL ) 0.25 MCG capsule Take 0.25 mcg by mouth daily. Patient not taking: Reported on 01/29/2024 11/06/19   [provider]  Calcium  500-100 MG-UNIT CHEW Chew 500 mg by mouth. Patient not taking: Reported on 01/29/2024    [provider]  losartan  (COZAAR ) 25 MG tablet Take 1 tablet (25 mg total) by mouth at bedtime. 09/24/20 12/23/20  Arlice Chapman, MD  losartan  (COZAAR ) 25 MG tablet Take 1 tablet by mouth at bedtime. Patient not taking: Reported on 11/27/2021 11/01/20   [provider]  polyethylene glycol-electrolytes (NULYTELY ) 420 g solution Take by mouth. Patient not taking: Reported on 11/27/2021 09/08/20   [provider]  torsemide  40 MG TABS Take 40 mg by mouth daily. Patient not taking: Reported on 07/21/2021 09/25/20 12/28/20  Arlice Chapman, MD    Physical Exam: Vitals:   01/29/24 1200 01/29/24 1236 01/29/24 1248 01/29/24 1254  BP: (!) 150/80     Pulse: 74  68   Resp: 17  18   Temp:      TempSrc:      SpO2:  (!) 67%  99%  Weight:      Height:        Physical Exam   Constitutional: Alert, sleepy and confused HEENT: Neck supple Respiratory: Bilateral decreased air entry at the bases, occasional fine crepitation on bases, scattered wheezes, no rhonchi Cardiovascular: Regular rate and rhythm,  no murmurs / rubs / gallops.  1+ bilateral lower extremity pitting edema. 2+ pedal pulses. No carotid bruits.  Abdomen: Soft, no tenderness, Bowel sounds positive.  Musculoskeletal: no clubbing / cyanosis. Good ROM, no contractures. Normal muscle tone.  Skin: no rashes, lesions, ulcers. Neurologic: CN 2-12 grossly intact. Sensation intact, No focal deficit identified Psychiatric: Sleepy  Labs on Admission: I have personally reviewed following labs and imaging studies  CBC: Recent Labs  Lab 01/29/24 0711 01/29/24 1150  WBC 10.2 10.4  NEUTROABS 8.9*  --   HGB 10.8* 10.1*  HCT 36.3 35.5*  MCV 100.6* 105.3*  PLT 170 164   Basic Metabolic Panel: Recent Labs  Lab 01/29/24 0711  NA 141  K 5.2*  CL 103  CO2 24  GLUCOSE 114*  BUN 69*  CREATININE 9.37*  CALCIUM  8.2*   GFR: Estimated Creatinine Clearance: 5.3 mL/min (A) (by C-G formula based on SCr of 9.37 mg/dL (H)). Liver Function Tests: Recent Labs  Lab 01/29/24 0711  AST 31  ALT  25  ALKPHOS 99  BILITOT 0.4  PROT 7.6  ALBUMIN 4.1   No results for input(s): LIPASE, AMYLASE in the last 168 hours. Recent Labs  Lab 01/29/24 1150  AMMONIA 50*   Coagulation Profile: Recent Labs  Lab 01/29/24 0711  INR 1.1   Cardiac Enzymes: No results for input(s): CKTOTAL, CKMB, CKMBINDEX, TROPONINI, TROPONINIHS in the last 168 hours. BNP (last 3 results) No results for input(s): BNP in the last 8760 hours. HbA1C: No results for input(s): HGBA1C in the last 72 hours. CBG: Recent Labs  Lab 01/29/24 1241  GLUCAP 130*   Lipid Profile: No results for input(s): CHOL, HDL, LDLCALC, TRIG, CHOLHDL, LDLDIRECT in the last 72 hours. Thyroid  Function Tests: No results for input(s): TSH, T4TOTAL, FREET4, T3FREE, THYROIDAB in the last 72 hours. Anemia Panel: No results for input(s): VITAMINB12, FOLATE, FERRITIN, TIBC, IRON , RETICCTPCT in the last 72 hours. Urine analysis:     Component Value Date/Time   COLORURINE YELLOW (A) 05/20/2023 1241   APPEARANCEUR CLEAR (A) 05/20/2023 1241   LABSPEC 1.014 05/20/2023 1241   PHURINE 6.0 05/20/2023 1241   GLUCOSEU NEGATIVE 05/20/2023 1241   HGBUR MODERATE (A) 05/20/2023 1241   BILIRUBINUR NEGATIVE 05/20/2023 1241   KETONESUR NEGATIVE 05/20/2023 1241   PROTEINUR 100 (A) 05/20/2023 1241   NITRITE NEGATIVE 05/20/2023 1241   LEUKOCYTESUR NEGATIVE 05/20/2023 1241    Radiological Exams on Admission: I have personally reviewed images DG Chest Port 1 View Result Date: 01/29/2024 EXAM: 1 VIEW(S) XRAY OF THE CHEST 01/29/2024 07:37:00 AM COMPARISON: 05/20/2023 CLINICAL HISTORY: Questionable sepsis - evaluate for abnormality FINDINGS: LUNGS AND PLEURA: Increased interstitial markings. Trace bilateral pleural effusions. No focal pulmonary opacity. No pneumothorax. HEART AND MEDIASTINUM: Cardiomegaly. Aortic calcification. BONES AND SOFT TISSUES: No acute osseous abnormality. IMPRESSION: 1. Increased interstitial markings and trace bilateral pleural effusions concerning for mild congestive heart failure. . 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  1.   Acute on chronic hypoxemic and hypercapnic respiratory failure requiring BiPAP - She has oxygen  at home 2 L via nasal cannula - Patient was brought in for confusion, cough, shortness of breath. - It is unclear whether she has pneumonia, congestive heart failure or only COPD exacerbation. - Her pH on ABG showed 7.01, CO2 of 95. - She will be continued on BiPAP for respiratory protocol for hypercapnic respiratory failure. - Ammonia level was checked which was 50, will get CT head to make sure she does not have any intracranial process going on - Continue oxygen  to maintain saturation more than 90% - Due to complex nature of her condition and severe acidosis, critical care consult has been placed and Dr. Isaiah will see the patient.  2.  Acute exacerbation of COPD - She will be placed on BiPAP, nebulization, antibiotics, oxygen  - Will repeat ABG after 2 hours of BiPAP - I will hold off giving steroid at this point. - Will follow the recommendation from pulmonary and critical care  3.  Possible community-acquired pneumonia/UTI - There is no obvious signs of sepsis - Will place her on pneumonia protocol - Urine analysis is still pending - Will send procalcitonin, urine Legionella and Streptococcus antigen - Viral panel with COVID RSV and flu have been negative - She will be continued on ceftriaxone  and azithromycin  at this point - Follow-up cultures  4.  Possible congestive heart failure in the setting of ESRD on hemodialysis - Sister-in-law at bedside who advised that patient missed dialysis. - Will get dialysis - Dr. Marcelino has been contacted and he has arrange the dialysis - Patient received 40 mg of IV Lasix  in the emergency room. - Will defer to nephrology on this  5.  Bilateral pleural effusion - May be related to fluid overload due to missed dialysis versus pneumonia - Patient received IV Lasix  will get dialysis and patient is on antibiotics for commune acquired pneumonia - Will  continue to monitor pleural effusion.  6.  History of hepatitis C - Ammonia level is 50 - I do not think that is contributing to her mentation - Will continue to monitor  7.  Acute change in mental status - There could be multiple etiology for this. - Patient has COPD with CO2 narcosis, azotemia missed dialysis, possible pneumonia/UTI - Will  screen her brain with the CT scan to rule out any bleeding  8.  HTN/HLD - Resume her home medications - Monitor blood pressure  9.  Anemia of chronic disease likely kidney disease - There is no indication of hematemesis or melena - Continue to monitor hemoglobin hematocrit  10.  ESRD on hemodialysis with missed dialysis - Nephrology has been contacted - They have arranged urgent dialysis as mentioned above  11.  Hyperkalemia - Potassium is 5.2 - There is no abnormal EKG - She is going for dialysis which will be adjusted during dialysis         DVT prophylaxis: SQ Heparin  Code Status: Full Code Family Communication: Patient's sister-in-law is at bedside.  She is the contact person.  Patient's husband is not able to make the decision.  Extensive discussion was done but the sister-in-law will get back to us  once she knows her  resuscitation status.  For now she said she is full code. Disposition Plan: Home versus rehab Consults called: Nephrology, critical care Admission status: Inpatient, Step Down Unit   Nena Rebel, MD [facility] 01/29/2024, 1:30 PM         [1]  Allergies Allergen Reactions   Penicillins Hives and Rash    Has patient had a PCN reaction causing immediate rash, facial/tongue/throat swelling, SOB or lightheadedness with hypotension: Yes Has patient had a PCN reaction causing severe rash involving mucus membranes or skin necrosis: Yes--all over body Has patient had a PCN reaction that required hospitalization: No  Has patient had a PCN reaction occurring within the last 10 years: No If all of the  above answers are NO, then may proceed with Cephalosporin use.

## 2024-01-29 NOTE — Progress Notes (Signed)
 Pt receives outpt HD at [facility] on TTS at 10:45am. Navigator following to assist with any HD needs.  Suzen Satchel Dialysis Navigator [phone]

## 2024-01-29 NOTE — ED Triage Notes (Signed)
 Pt BIB [facility] from home c/o worsening SOB. Pt has COPD wears 2L at baseline satting at 88%. 82% on RA. Pt reports  a productive cough for a few days. Audible expiratory wheezing. Is a dialysis pt.  182/88 79 HR

## 2024-01-29 NOTE — Progress Notes (Signed)
 [facility]  ROUNDING NOTE   Subjective:   Patient well-known to us  as we follow her for outpatient hemodialysis treatment at [facility] on TTS schedule. She was rather lethargic today upon initial evaluation. Therefore dialysis was requested today.  Objective:  Vital signs in last 24 hours:  Temp:  [93.8 F (34.3 C)-98.6 F (37 C)] 95.2 F (35.1 C) (12/17 1600) Pulse Rate:  [50-97] 61 (12/17 1645) Resp:  [12-28] 22 (12/17 1645) BP: (84-176)/(43-92) 94/59 (12/17 1645) SpO2:  [67 %-100 %] 100 % (12/17 1708) FiO2 (%):  [35 %-40 %] 40 % (12/17 1708) Weight:  [69.3 kg-72.2 kg] 69.3 kg (12/17 1600)  Weight change:  Filed Weights   01/29/24 0707 01/29/24 1600  Weight: 72.2 kg 69.3 kg    Intake/Output: No intake/output data recorded.   Intake/Output this shift:  Total I/O In: 680.4 [I.V.:32.4; IV Piggyback:648] Out: -   Physical Exam: General: Lethargic  Head: Normocephalic, atraumatic. Moist oral mucosal membranes  Neck: Supple  Lungs:  Clear to auscultation, normal effort  Heart: S1S2 no rubs  Abdomen:  Soft, nontender, bowel sounds present  Extremities: Trace peripheral edema.  Neurologic: Lethargic  Skin: No acute rash  Access: Right IJ PermCath    Basic Metabolic Panel: Recent Labs  Lab 01/29/24 0711 01/29/24 1150  NA 141  --   K 5.2*  --   CL 103  --   CO2 24  --   GLUCOSE 114*  --   BUN 69*  --   CREATININE 9.37* 9.51*  CALCIUM  8.2*  --     Liver Function Tests: Recent Labs  Lab 01/29/24 0711  AST 31  ALT 25  ALKPHOS 99  BILITOT 0.4  PROT 7.6  ALBUMIN 4.1   No results for input(s): LIPASE, AMYLASE in the last 168 hours. Recent Labs  Lab 01/29/24 1150  AMMONIA 50*    CBC: Recent Labs  Lab 01/29/24 0711 01/29/24 1150  WBC 10.2 10.4  NEUTROABS 8.9*  --   HGB 10.8* 10.1*  HCT 36.3 35.5*  MCV 100.6* 105.3*  PLT 170 164    Cardiac Enzymes: No results for input(s): CKTOTAL, CKMB, CKMBINDEX,  TROPONINI in the last 168 hours.  BNP: Invalid input(s): POCBNP  CBG: Recent Labs  Lab 01/29/24 1241 01/29/24 1611  GLUCAP 130* 114*    Microbiology: Results for orders placed or performed during the hospital encounter of 01/29/24  Resp panel by RT-PCR (RSV, Flu A&B, Covid) Anterior Nasal Swab     Status: None   Collection Time: 01/29/24  8:35 AM   Specimen: Anterior Nasal Swab  Result Value Ref Range Status   SARS Coronavirus 2 by RT PCR NEGATIVE NEGATIVE Final    Comment: (NOTE) SARS-CoV-2 target nucleic acids are NOT DETECTED.  The SARS-CoV-2 RNA is generally detectable in upper respiratory specimens during the acute phase of infection. The lowest concentration of SARS-CoV-2 viral copies this assay can detect is 138 copies/mL. A negative result does not preclude SARS-Cov-2 infection and should not be used as the sole basis for treatment or other patient management decisions. A negative result may occur with  improper specimen collection/handling, submission of specimen other than nasopharyngeal swab, presence of viral mutation(s) within the areas targeted by this assay, and inadequate number of viral copies(<138 copies/mL). A negative result must be combined with clinical observations, patient history, and epidemiological information. The expected result is Negative.  Fact Sheet for Patients:  bloggercourse.com  Fact Sheet for Healthcare Providers:  seriousbroker.it  This test is no t yet approved or cleared by the [place]  FDA and  has been authorized for detection and/or diagnosis of SARS-CoV-2 by FDA under an Emergency Use Authorization (EUA). This EUA will remain  in effect (meaning this test can be used) for the duration of the COVID-19 declaration under Section 564(b)(1) of the Act, 21 U.S.C.section 360bbb-3(b)(1), unless the authorization is terminated  or revoked sooner.       Influenza A by PCR  NEGATIVE NEGATIVE Final   Influenza B by PCR NEGATIVE NEGATIVE Final    Comment: (NOTE) The Xpert Xpress SARS-CoV-2/FLU/RSV plus assay is intended as an aid in the diagnosis of influenza from Nasopharyngeal swab specimens and should not be used as a sole basis for treatment. Nasal washings and aspirates are unacceptable for Xpert Xpress SARS-CoV-2/FLU/RSV testing.  Fact Sheet for Patients: bloggercourse.com  Fact Sheet for Healthcare Providers: seriousbroker.it  This test is not yet approved or cleared by the [place]  FDA and has been authorized for detection and/or diagnosis of SARS-CoV-2 by FDA under an Emergency Use Authorization (EUA). This EUA will remain in effect (meaning this test can be used) for the duration of the COVID-19 declaration under Section 564(b)(1) of the Act, 21 U.S.C. section 360bbb-3(b)(1), unless the authorization is terminated or revoked.     Resp Syncytial Virus by PCR NEGATIVE NEGATIVE Final    Comment: (NOTE) Fact Sheet for Patients: bloggercourse.com  Fact Sheet for Healthcare Providers: seriousbroker.it  This test is not yet approved or cleared by the [place]  FDA and has been authorized for detection and/or diagnosis of SARS-CoV-2 by FDA under an Emergency Use Authorization (EUA). This EUA will remain in effect (meaning this test can be used) for the duration of the COVID-19 declaration under Section 564(b)(1) of the Act, 21 U.S.C. section 360bbb-3(b)(1), unless the authorization is terminated or revoked.  Performed at [facility], [address] [place], [place] [address]     Coagulation Studies: Recent Labs    01/29/24 0711  LABPROT 14.4  INR 1.1    Urinalysis: No results for input(s): COLORURINE, LABSPEC, PHURINE, GLUCOSEU, HGBUR, BILIRUBINUR, KETONESUR, PROTEINUR, UROBILINOGEN, NITRITE,  LEUKOCYTESUR in the last 72 hours.  Invalid input(s): APPERANCEUR    Imaging: DG Chest Portable 1 View Result Date: 01/29/2024 CLINICAL DATA:  Post intubation. EXAM: PORTABLE CHEST 1 VIEW COMPARISON:  Chest radiograph dated 01/29/2024. FINDINGS: Endotracheal tube approximately 4 cm above the carina. Enteric tube with tip in the proximal stomach and side port distal esophagus or GE junction. Recommend further advancing by additional 7 cm. Bilateral pulmonary opacities as seen on the earlier radiograph. No pneumothorax. Stable cardiomegaly. No acute osseous pathology. IMPRESSION: 1. Endotracheal tube above the carina. 2. Enteric tube with tip in the proximal stomach and side port distal esophagus or GE junction. Recommend further advancing by additional 7 cm. Electronically Signed   By: Vanetta Chou M.D.   On: 01/29/2024 15:15   CT HEAD WO CONTRAST ( ) Result Date: 01/29/2024 CLINICAL DATA:  Altered mental status. EXAM: CT HEAD WITHOUT CONTRAST TECHNIQUE: Contiguous axial images were obtained from the base of the skull through the vertex without intravenous contrast. RADIATION DOSE REDUCTION: This exam was performed according to the departmental dose-optimization program which includes automated exposure control, adjustment of the mA and/or kV according to patient size and/or use of iterative reconstruction technique. COMPARISON:  02/03/2006 FINDINGS: Brain: Cavum septum variant is present. Ventricles, cisterns and other CSF spaces are otherwise unremarkable. Minimal bilateral basal ganglia calcifications are  present. There is mild chronic ischemic microvascular disease. No mass, mass effect, shift of midline structures or acute hemorrhage. No evidence of acute infarction. Vascular: No hyperdense vessel or unexpected calcification. Skull: Normal. Negative for fracture or focal lesion. Sinuses/Orbits: Orbits are normal symmetric. Paranasal sinuses are clear as there are hypoplastic frontal  sinuses. Other: None. IMPRESSION: 1. No acute findings. 2. Mild chronic ischemic microvascular disease. Electronically Signed   By: Toribio Agreste M.D.   On: 01/29/2024 13:51   DG Chest Portable 1 View Result Date: 01/29/2024 CLINICAL DATA:  Shortness of breath. EXAM: PORTABLE CHEST 1 VIEW COMPARISON:  01/29/2024 FINDINGS: Lungs are adequately inflated with persistent bibasilar opacification which may be due to vascular congestion versus atelectasis or infection. No significant effusion. Stable cardiomegaly. Remainder of the exam is unchanged. IMPRESSION: Persistent bibasilar opacification which may be due to vascular congestion versus atelectasis or infection. Electronically Signed   By: Toribio Agreste M.D.   On: 01/29/2024 13:41   DG Chest Port 1 View Result Date: 01/29/2024 EXAM: 1 VIEW(S) XRAY OF THE CHEST 01/29/2024 07:37:00 AM COMPARISON: 05/20/2023 CLINICAL HISTORY: Questionable sepsis - evaluate for abnormality FINDINGS: LUNGS AND PLEURA: Increased interstitial markings. Trace bilateral pleural effusions. No focal pulmonary opacity. No pneumothorax. HEART AND MEDIASTINUM: Cardiomegaly. Aortic calcification. BONES AND SOFT TISSUES: No acute osseous abnormality. IMPRESSION: 1. Increased interstitial markings and trace bilateral pleural effusions concerning for mild congestive heart failure. . Electronically signed by: Waddell Calk MD 01/29/2024 07:52 AM EST RP Workstation: [id number]     Medications:    [START ON 01/30/2024] azithromycin      [START ON 01/30/2024] cefTRIAXone  (ROCEPHIN )  IV     fentaNYL  infusion INTRAVENOUS 50 mcg/hr (01/29/24 1604)   propofol  (DIPRIVAN ) infusion 30 mcg/kg/min (01/29/24 1604)    amLODipine   10 mg Oral Daily   aspirin  EC  325 mg Oral Daily   Chlorhexidine  Gluconate Cloth  6 each Topical Q0600   docusate  100 mg Per Tube BID   etomidate        heparin   5,000 Units Subcutaneous Q8H   ipratropium-albuterol   3 mL Nebulization Q6H   methylPREDNISolone   (SOLU-MEDROL ) injection  40 mg Intravenous Daily   mouth rinse  15 mL Mouth Rinse Q2H   pantoprazole  (PROTONIX ) IV  40 mg Intravenous Daily   polyethylene glycol  17 g Per Tube Daily   succinylcholine        acetaminophen  **OR** acetaminophen , etomidate , fentaNYL , midazolam  PF, ondansetron  **OR** ondansetron  (ZOFRAN ) IV, mouth rinse, polyethylene glycol, succinylcholine   Assessment/ Plan:  73 y.o. female with past medical history of ESRD on HD TTS, COPD on home oxygen  2 L, CHF, hepatitis C, hypertension, upper lipidemia, anemia chronic kidney disease, secondary hyperparathyroidism who came in with lethargy and ultimately found to have acute respiratory failure.  1.  ESRD on HD TTS.  We were asked to evaluate the patient for dialysis today given ongoing lethargy.  We have placed orders for dialysis today however patient subsequently decompensated after our evaluation.  Once she has stabilized we will reevaluate the patient for dialysis treatment today.  2.  Acute respiratory failure.  pCO2 noted to be quite high.  Pulmonary/critical care following her course closely.  3.  Anemia chronic kidney disease.  Hemoglobin 10.1.  Burrell Soulier as outpatient.  Hold off on erythropoietin  stimulating agents for now.  4.  Secondary hyperparathyroidism.  Monitor bone metabolism parameters over the course of hospitalization.   LOS: 0 Anna Key 12/17/20255:18 PM

## 2024-01-29 NOTE — Progress Notes (Signed)
 Per [facility] ICU NP OG is in good placement and good to use.

## 2024-01-29 NOTE — ED Notes (Signed)
 Pt to CT with primary RN at this time.

## 2024-01-29 NOTE — ED Notes (Signed)
 This RN at bedside to transport pt to CT. Vitals WDL

## 2024-01-29 NOTE — Consult Note (Signed)
 NAME:  Anna Key, MRN:  [id number], DOB:  1951/02/13, LOS: 0 ADMISSION DATE:  01/29/2024, CONSULTATION DATE:  01/29/2024 REFERRING MD:  Dr. Roann, CHIEF COMPLAINT:  Shortness of breath, hypoxia, altered mental status   Brief Pt Description / Synopsis:  73 y.o. female with PMHx significant for ESRD on Hemodialysis, COPD requiring 2L supplemental oxygen , CHF, and Hepatitis C admitted with Acute Metabolic Encephalopathy and Acute on Chronic Hypoxic and Hypercapnic Respiratory Failure due to Volume Overload from missed dialysis, Acute COPD Exacerbation, and questionable pneumonia.  Failed trial of BiPAP requiring intubation and mechanical ventilation.    History of Present Illness:  Anna Key is a pleasant 73 y.o. female with medical history significant for COPD on home oxygen  2 L via nasal cannula, ESRD on hemodialysis, anemia, CHF, hep C, HTN, HLD who was brought into the ED on 01/29/24 for concern for shortness of breath, cough and confusion.  Patient is currently intubated and sedated and unable to contribute to history, therefore history is obtained from chart review.   It is reported that EMS found her hypoxic with O2 saturations of 82% on room air, confused and lethargic.  Reportedly the patient endorsed a productive cough for a few days along with audible wheezing.  ED Course: Initial Vital Signs: Temperature 98.6 F orally, RR 21, pulse 83, BP 176/91, SpO2 80% on room air Significant Labs: Potassium 5.2, glucose 114, BUN 69, Creatinine 9.3, Pro-BNP >35000, lactic acid 0.9, procalcitonin 0.48, WBC 10.2 Imaging Chest X-ray>>IMPRESSION: 1. Increased interstitial markings and trace bilateral pleural effusions concerning for mild congestive heart failure. . CT Head wo contrast>>IMPRESSION: 1. No acute findings. 2. Mild chronic ischemic microvascular disease. Medications Administered: 80 mg IV Lasix , Levaquin , duonebs, 500 cc NS bolus   TRH asked to admit for further workup  and treatment.  Found to be Hypercapnic and was placed on BiPAP, but ultimately with poor tolerance of BiPAP with worsening hypoxia, required intubation in the ED.  PCCM consulted.   Please see Significant Hospital Events section below for full detailed hospital course.   Pertinent  Medical History   Past Medical History:  Diagnosis Date   Anemia    Arthritis    osteoarthritis of right knee   CHF (congestive heart failure) (HCC)    Chronic kidney disease    stage 3   Gout    Hepatitis C antibody test positive    Hyperlipidemia    Hypertension     Micro Data:  12/17: COVID/FLU/RSV PCR>> negative 12/17: RVP>> 12/17: Blood cultures x2>> 12/17; Sputum>> 12/17: MRSA PCR>> 12/17: Strep pneumo urinary antigen>> 12/17: Legionella urinary antigen>>  Antimicrobials:   Anti-infectives (From admission, onward)    Start     Dose/Rate Route Frequency Ordered Stop   01/30/24 1000  cefTRIAXone  (ROCEPHIN ) 2 g in sodium chloride  0.9 % 100 mL IVPB        2 g 200 mL/hr over 30 Minutes Intravenous Every 24 hours 01/29/24 1157 02/04/24 0959   01/30/24 1000  azithromycin  (ZITHROMAX ) 500 mg in sodium chloride  0.9 % 250 mL IVPB        500 mg 250 mL/hr over 60 Minutes Intravenous Every 24 hours 01/29/24 1157 02/04/24 0959   01/29/24 0815  levofloxacin  (LEVAQUIN ) IVPB 750 mg        750 mg 100 mL/hr over 90 Minutes Intravenous  Once 01/29/24 0802 01/29/24 1005       Significant Hospital Events: Including procedures, antibiotic start and stop dates in addition to other  pertinent events   12/17: Presented to ED due to shortness of breath and AMS.  Placed on BiPAP, TRH asked to admit with plans for urgent HD.  Remained hypoxic and altered despite BiPAP requiring intubation.  PCCM consulted.   Interim History / Subjective:  As outlined above under Significant Hospital Events section  Objective   Blood pressure (!) 159/92, pulse 60, temperature 97.6 F (36.4 C), temperature source Oral,  resp. rate 20, height 5' 4 (1.626 m), weight 72.2 kg, SpO2 100%.    Vent Mode: PRVC FiO2 (%):  [35 %-40 %] 40 % Set Rate:  [22 bmp] 22 bmp Vt Set:  [450 mL] 450 mL PEEP:  [5 cmH20] 5 cmH20   Intake/Output Summary (Last 24 hours) at 01/29/2024 1429 Last data filed at 01/29/2024 1005 Gross per 24 hour  Intake 648 ml  Output --  Net 648 ml   Filed Weights   01/29/24 0707  Weight: 72.2 kg    Examination: General: Critically ill appearing on chronically ill appearing female, laying in bed, intubated, lightly sedated and agitated HENT: Atraumatic, normocephalic, neck supple, + JVD, orally intubated Lungs: Coarse breath sounds throughout, even, overbreathing the vent Cardiovascular: Tachycardia, regular rhythm, s1s2, no M/R/G Abdomen: Soft, nontender, nondistended, no guarding or rebound tenderness, BS + x4 Extremities: Normal bulk and tone, no deformities, 1+ edema BLE Neuro: Lightly sedated and agitated, moves all extremities purposefully but not to commands, PERRL GU: deferred   Resolved Hospital Problem list     Assessment & Plan:   #Acute on Chronic Hypoxic & Hypercapnic Respiratory Failure #Volume Overload #Acute COPD Exacerbation #Questionable Pneumonia  #Bilateral Pleural Effusions -Full vent support, implement lung protective strategies -Plateau pressures less than 30 cm H20 -Wean FiO2 & PEEP as tolerated to maintain O2 sats >92% -Follow intermittent Chest X-ray & ABG as needed -Spontaneous Breathing Trials when respiratory parameters met and mental status permits -Implement VAP Bundle -Bronchodilators -IV Steroids -ABX as above -Volume removal with HD  #Acute Decompensated HFpEF #Hypertension PMHx: HLD Echocardiogram 09/20/20: LVEF 70-75%, Grade I Diastolic dysfunction, RV systolic function normal, severely elevated pulmonary artery systolic pressure -Continuous cardiac monitoring -Maintain MAP >65 -Vasopressors as needed to maintain MAP goal ~ currently not  requiring  -Echocardiogram pending -Volume removal with HD -Did receive dose of Lasix  in ED -Hold home antihypertensives for now  #ESRD on Hemodialysis  #Mild Hyperkalemia -Monitor I&O's / urinary output -Follow BMP -Ensure adequate renal perfusion -Avoid nephrotoxic agents as able -Replace electrolytes as indicated ~ Pharmacy following for assistance with electrolyte replacement -Nephrology consulted, appreciate input ~ Plan for urgent HD 12/17  #Anemia of chronic disease -Monitor for S/Sx of bleeding -Trend CBC -Heparin  SQ for VTE Prophylaxis  -Transfuse for Hgb <7  #Acute Metabolic Encephalopathy #CO2 Narcosis #Sedation needs in setting of mechanical ventilation CT Head negative  -Treatment of metabolic derangements and hypercapnia as outlined above  -Maintain a RASS goal of 0 to -1 -Fentanyl  and Propofol  to maintain RASS goal -Avoid sedating medications as able -Daily wake up assessment    Best Practice (right click and Reselect all SmartList Selections daily)   Diet/type: NPO DVT prophylaxis: prophylactic heparin   GI prophylaxis: PPI Lines: N/A Foley:  N/A Code Status:  full code Last date of multidisciplinary goals of care discussion [N/A]  12/17: Will update family when they arrive at bedside on plan of care.  Labs   CBC: Recent Labs  Lab 01/29/24 0711 01/29/24 1150  WBC 10.2 10.4  NEUTROABS 8.9*  --  HGB 10.8* 10.1*  HCT 36.3 35.5*  MCV 100.6* 105.3*  PLT 170 164    Basic Metabolic Panel: Recent Labs  Lab 01/29/24 0711 01/29/24 1150  NA 141  --   K 5.2*  --   CL 103  --   CO2 24  --   GLUCOSE 114*  --   BUN 69*  --   CREATININE 9.37* 9.51*  CALCIUM  8.2*  --    GFR: Estimated Creatinine Clearance: 5.2 mL/min (A) (by C-G formula based on SCr of 9.51 mg/dL (H)). Recent Labs  Lab 01/29/24 0711 01/29/24 1150 01/29/24 1157  PROCALCITON  --   --  0.48  WBC 10.2 10.4  --   LATICACIDVEN 0.9  --   --     Liver Function  Tests: Recent Labs  Lab 01/29/24 0711  AST 31  ALT 25  ALKPHOS 99  BILITOT 0.4  PROT 7.6  ALBUMIN 4.1   No results for input(s): LIPASE, AMYLASE in the last 168 hours. Recent Labs  Lab 01/29/24 1150  AMMONIA 50*    ABG    Component Value Date/Time   PHART 7.01 (LL) 01/29/2024 1251   PCO2ART 95 (HH) 01/29/2024 1251   PO2ART 213 (H) 01/29/2024 1251   HCO3 24.0 01/29/2024 1251   TCO2 18 (L) 03/22/2021 0937   ACIDBASEDEF 9.2 (H) 01/29/2024 1251   O2SAT 100 01/29/2024 1251     Coagulation Profile: Recent Labs  Lab 01/29/24 0711  INR 1.1    Cardiac Enzymes: No results for input(s): CKTOTAL, CKMB, CKMBINDEX, TROPONINI in the last 168 hours.  HbA1C: No results found for: HGBA1C  CBG: Recent Labs  Lab 01/29/24 1241  GLUCAP 130*    Review of Systems:   Unable to assess due to intubation/sedation/critical illness    Past Medical History:  She,  has a past medical history of Anemia, Arthritis, CHF (congestive heart failure) (HCC), Chronic kidney disease, Gout, Hepatitis C antibody test positive, Hyperlipidemia, and Hypertension.   Surgical History:   Past Surgical History:  Procedure Laterality Date   A/V FISTULAGRAM N/A 09/22/2020   Procedure: A/V Fistulagram;  Surgeon: Marea Selinda RAMAN, MD;  Location: [facility];  Service: Cardiovascular;  Laterality: N/A;   AV FISTULA PLACEMENT Left 11/27/2019   Procedure: ARTERIOVENOUS (AV) Brachiocephalic FISTULA CREATION, possible graft;  Surgeon: Jama Cordella MATSU, MD;  Location: [facility];  Service: Vascular;  Laterality: Left;   CARDIAC CATHETERIZATION     COLONOSCOPY WITH PROPOFOL  N/A 03/22/2021   Procedure: COLONOSCOPY WITH PROPOFOL ;  Surgeon: Toledo, Ladell POUR, MD;  Location: [facility];  Service: Gastroenterology;  Laterality: N/A;   DIAGNOSTIC LAPAROSCOPY     ESOPHAGOGASTRODUODENOSCOPY N/A 03/22/2021   Procedure: ESOPHAGOGASTRODUODENOSCOPY (EGD);  Surgeon: Toledo, Ladell POUR, MD;  Location:  [facility];  Service: Gastroenterology;  Laterality: N/A;   LAPAROSCOPIC LYSIS OF ADHESIONS  08/12/2019   Procedure: LAPAROSCOPIC LYSIS OF ADHESIONS;  Surgeon: Jordis Laneta FALCON, MD;  Location: [facility];  Service: General;;   PARTIAL KNEE ARTHROPLASTY Right 06/11/2017   Procedure: UNICOMPARTMENTAL KNEE;  Surgeon: Edie Norleen PARAS, MD;  Location: [facility];  Service: Orthopedics;  Laterality: Right;   REPLACEMENT TOTAL KNEE Left 2012   TEMPORARY DIALYSIS CATHETER  09/20/2020   Procedure: TEMPORARY DIALYSIS CATHETER;  Surgeon: Jama Cordella MATSU, MD;  Location: [facility];  Service: Cardiovascular;;     Social History:   reports that she has been smoking cigarettes. She has never used smokeless tobacco. She reports that she does not  currently use drugs. She reports that she does not drink alcohol.   Family History:  Her family history includes Colon cancer in her mother.   Allergies Allergies[1]   Home Medications  Prior to Admission medications  Medication Sig Start Date End Date Taking? Authorizing Provider  allopurinol  (ZYLOPRIM ) 100 MG tablet Take 100 mg by mouth every morning.    Yes [provider]  amLODipine  (NORVASC ) 10 MG tablet Take 1 tablet (10 mg total) by mouth daily. 09/24/20 01/29/24 Yes Dahal, Chapman, MD  aspirin  EC 325 MG EC tablet Take 1 tablet (325 mg total) by mouth daily. 09/25/20  Yes Dahal, Chapman, MD  isosorbide  mononitrate (IMDUR ) 60 MG 24 hr tablet Take 1 tablet (60 mg total) by mouth every morning. 09/24/20 01/29/24 Yes Dahal, Chapman, MD  metoprolol  (TOPROL -XL) 200 MG 24 hr tablet Take 1 tablet (200 mg total) by mouth every morning. 09/24/20 01/29/24 Yes Dahal, Chapman, MD  atorvastatin  (LIPITOR) 40 MG tablet Take 40 mg by mouth every morning.  Patient not taking: Reported on 01/29/2024    [provider]  calcitRIOL  (ROCALTROL ) 0.25 MCG capsule Take 0.25 mcg by mouth daily. Patient not taking: Reported on 01/29/2024 11/06/19   [provider]  Calcium  500-100 MG-UNIT CHEW Chew 500 mg by mouth. Patient not taking: Reported on 01/29/2024    [provider]  losartan  (COZAAR ) 25 MG tablet Take 1 tablet (25 mg total) by mouth at bedtime. 09/24/20 12/23/20  Arlice Chapman, MD  losartan  (COZAAR ) 25 MG tablet Take 1 tablet by mouth at bedtime. Patient not taking: Reported on 11/27/2021 11/01/20   [provider]  polyethylene glycol-electrolytes (NULYTELY ) 420 g solution Take by mouth. Patient not taking: Reported on 11/27/2021 09/08/20   [provider]  torsemide  40 MG TABS Take 40 mg by mouth daily. Patient not taking: Reported on 07/21/2021 09/25/20 12/28/20  Arlice Chapman, MD     Critical care time: 60 minutes     Inge Lecher, AGACNP-BC [facility] Prefer epic messenger for cross cover needs If after hours, please call E-link       [1]  Allergies Allergen Reactions   Penicillins Hives and Rash    Has patient had a PCN reaction causing immediate rash, facial/tongue/throat swelling, SOB or lightheadedness with hypotension: Yes Has patient had a PCN reaction causing severe rash involving mucus membranes or skin necrosis: Yes--all over body Has patient had a PCN reaction that required hospitalization: No  Has patient had a PCN reaction occurring within the last 10 years: No If all of the above answers are NO, then may proceed with Cephalosporin use.

## 2024-01-29 NOTE — ED Notes (Addendum)
 Pt family member came to nurses station stating that there was blood all over pt. This RN came in room to find that pt had removed her IV. Pt was sitting up on the side of the bed with her oxygen  pulled off. Pt alert but altered. Pt family member at bedside states that this is not her baseline. Family member states that she normally able to take care of her husband at home who has dementia. Ozell RN came in to help get pt situated back in the bed, Pt was  backed on nasal canula and her sats were 67% on 4 liters of oxygen . Pt placed on Non-rebreather as well. Secretary informed to page attending MD. ED and additional nursing staff to bedside. Pt sats improved to 99-100% on nasal canula + NRB. Pt alert to verbal stimuli.

## 2024-01-29 NOTE — ED Notes (Signed)
 This RN talked to [employer], will do dialysis at bedside

## 2024-01-29 NOTE — ED Notes (Addendum)
 Pt given warm blanket. Even rise and fall of chest noted. Vitals WDL. [place] readjusted spo2 @ 93%

## 2024-01-29 NOTE — ED Notes (Signed)
 Walked in to pts room due to alarm, O2 noted to be 77% on Bipap. Respiratory called.

## 2024-01-30 ENCOUNTER — Inpatient Hospital Stay

## 2024-01-30 ENCOUNTER — Inpatient Hospital Stay: Admit: 2024-01-30 | Discharge: 2024-01-30 | Disposition: A | Attending: Hospitalist

## 2024-01-30 DIAGNOSIS — J189 Pneumonia, unspecified organism: Principal | ICD-10-CM

## 2024-01-30 DIAGNOSIS — I5031 Acute diastolic (congestive) heart failure: Secondary | ICD-10-CM | POA: Diagnosis not present

## 2024-01-30 DIAGNOSIS — B348 Other viral infections of unspecified site: Secondary | ICD-10-CM

## 2024-01-30 DIAGNOSIS — J90 Pleural effusion, not elsewhere classified: Secondary | ICD-10-CM

## 2024-01-30 LAB — COMPREHENSIVE METABOLIC PANEL WITH GFR
ALT: 17 U/L (ref 0–44)
AST: 22 U/L (ref 15–41)
Albumin: 3.2 g/dL — ABNORMAL LOW (ref 3.5–5.0)
Alkaline Phosphatase: 71 U/L (ref 38–126)
Anion gap: 16 — ABNORMAL HIGH (ref 5–15)
BUN: 33 mg/dL — ABNORMAL HIGH (ref 8–23)
CO2: 26 mmol/L (ref 22–32)
Calcium: 7.5 mg/dL — ABNORMAL LOW (ref 8.9–10.3)
Chloride: 96 mmol/L — ABNORMAL LOW (ref 98–111)
Creatinine, Ser: 5.38 mg/dL — ABNORMAL HIGH (ref 0.44–1.00)
GFR, Estimated: 8 mL/min — ABNORMAL LOW (ref >60)
Glucose, Bld: 88 mg/dL (ref 70–99)
Potassium: 3.4 mmol/L — ABNORMAL LOW (ref 3.5–5.1)
Sodium: 138 mmol/L (ref 135–145)
Total Bilirubin: 0.4 mg/dL (ref 0.0–1.2)
Total Protein: 5.9 g/dL — ABNORMAL LOW (ref 6.5–8.1)

## 2024-01-30 LAB — CBC
HCT: 30.4 % — ABNORMAL LOW (ref 36.0–46.0)
Hemoglobin: 9.3 g/dL — ABNORMAL LOW (ref 12.0–15.0)
MCH: 30.2 pg (ref 26.0–34.0)
MCHC: 30.6 g/dL (ref 30.0–36.0)
MCV: 98.7 fL (ref 80.0–100.0)
Platelets: 122 K/uL — ABNORMAL LOW (ref 150–400)
RBC: 3.08 MIL/uL — ABNORMAL LOW (ref 3.87–5.11)
RDW: 16.6 % — ABNORMAL HIGH (ref 11.5–15.5)
WBC: 7.4 K/uL (ref 4.0–10.5)
nRBC: 0 % (ref 0.0–0.2)

## 2024-01-30 LAB — BLOOD GAS, ARTERIAL
Acid-Base Excess: 2.5 mmol/L — ABNORMAL HIGH (ref 0.0–2.0)
Bicarbonate: 27.9 mmol/L (ref 20.0–28.0)
FIO2: 28 %
MECHVT: 450 mL
O2 Saturation: 95.7 %
PEEP: 5 cmH2O
Patient temperature: 37
RATE: 22 {breaths}/min
pCO2 arterial: 45 mmHg (ref 32–48)
pH, Arterial: 7.4 (ref 7.35–7.45)
pO2, Arterial: 66 mmHg — ABNORMAL LOW (ref 83–108)

## 2024-01-30 LAB — ECHOCARDIOGRAM COMPLETE
AR max vel: 2.04 cm2
AV Area VTI: 2.21 cm2
AV Area mean vel: 2.06 cm2
AV Mean grad: 19.8 mmHg
AV Peak grad: 35.9 mmHg
Ao pk vel: 3 m/s
Area-P 1/2: 3.23 cm2
Height: 64 in
P 1/2 time: 556 ms
S' Lateral: 2.6 cm
Weight: 2444.46 [oz_av]

## 2024-01-30 LAB — MAGNESIUM: Magnesium: 1.9 mg/dL (ref 1.7–2.4)

## 2024-01-30 LAB — GLUCOSE, CAPILLARY
Glucose-Capillary: 110 mg/dL — ABNORMAL HIGH (ref 70–99)
Glucose-Capillary: 148 mg/dL — ABNORMAL HIGH (ref 70–99)
Glucose-Capillary: 82 mg/dL (ref 70–99)
Glucose-Capillary: 93 mg/dL (ref 70–99)
Glucose-Capillary: 95 mg/dL (ref 70–99)

## 2024-01-30 LAB — HEPATITIS B SURFACE ANTIBODY, QUANTITATIVE: Hep B S AB Quant (Post): <3.5 m[IU]/mL — ABNORMAL LOW

## 2024-01-30 LAB — STREP PNEUMONIAE URINARY ANTIGEN: Strep Pneumo Urinary Antigen: NEGATIVE

## 2024-01-30 LAB — PHOSPHORUS: Phosphorus: 4 mg/dL (ref 2.5–4.6)

## 2024-01-30 LAB — PROTIME-INR
INR: 1.1 (ref 0.8–1.2)
Prothrombin Time: 14.9 s (ref 11.4–15.2)

## 2024-01-30 MED ORDER — DEXMEDETOMIDINE HCL IN NACL 400 MCG/100ML IV SOLN
0.0000 ug/kg/h | INTRAVENOUS | Status: DC
  Administered 2024-01-30: 08:00:00 0.4 ug/kg/h via INTRAVENOUS
  Filled 2024-01-30: qty 100

## 2024-01-30 MED ORDER — ACETAMINOPHEN 650 MG RE SUPP: 650.0000 mg | Freq: Four times a day (QID) | RECTAL | Status: DC | PRN

## 2024-01-30 MED ORDER — POLYETHYLENE GLYCOL 3350 17 G PO PACK: 17.0000 g | PACK | Freq: Every day | ORAL | Status: DC | PRN

## 2024-01-30 MED ORDER — POTASSIUM CHLORIDE 20 MEQ PO PACK
20.0000 meq | PACK | Freq: Once | ORAL | Status: AC
  Administered 2024-01-30: 11:00:00 20 meq
  Filled 2024-01-30: qty 1

## 2024-01-30 MED ORDER — ONDANSETRON HCL 4 MG PO TABS: 4.0000 mg | ORAL_TABLET | Freq: Four times a day (QID) | ORAL | Status: DC | PRN

## 2024-01-30 MED ORDER — ACETAMINOPHEN 325 MG PO TABS: 650.0000 mg | ORAL_TABLET | Freq: Four times a day (QID) | ORAL | Status: DC | PRN

## 2024-01-30 MED ORDER — ONDANSETRON HCL 4 MG/2ML IJ SOLN: 4.0000 mg | Freq: Four times a day (QID) | INTRAMUSCULAR | Status: DC | PRN

## 2024-01-30 NOTE — Plan of Care (Signed)
°  Problem: Activity: Goal: Ability to tolerate increased activity will improve Outcome: Progressing   Problem: Respiratory: Goal: Ability to maintain a clear airway and adequate ventilation will improve Outcome: Progressing   Problem: Activity: Goal: Ability to tolerate increased activity will improve Outcome: Progressing   Problem: Clinical Measurements: Goal: Ability to maintain a body temperature in the normal range will improve Outcome: Progressing   Problem: Respiratory: Goal: Ability to maintain adequate ventilation will improve Outcome: Progressing

## 2024-01-30 NOTE — Progress Notes (Signed)
 [facility]  ROUNDING NOTE   Subjective:   Patient well-known to us  as we follow her for outpatient hemodialysis treatment at [facility] on TTS schedule.  Had significant lethargy upon admission and found to have RSV infection.  Subsequently developed acute respiratory failure.  Update: Patient maintained on vent support.  Underwent dialysis early this a.m.  UF achieved was 0.6 kg.  Objective:  Vital signs in last 24 hours:  Temp:  [96.6 F (35.9 C)-100.8 F (38.2 C)] 100.2 F (37.9 C) (12/18 1500) Pulse Rate:  [51-99] 99 (12/18 1500) Resp:  [19-22] 19 (12/18 1500) BP: (89-143)/(47-90) 143/90 (12/18 1500) SpO2:  [90 %-100 %] 92 % (12/18 1500) FiO2 (%):  [28 %-40 %] 36 % (12/18 1450)  Weight change:  Filed Weights   01/29/24 0707 01/29/24 1600  Weight: 72.2 kg 69.3 kg    Intake/Output: I/O last 3 completed shifts: In: 929.8 [I.V.:281.8; IV Piggyback:648] Out: 640 [Urine:40; Other:600]   Intake/Output this shift:  Total I/O In: 571.6 [I.V.:221.6; IV Piggyback:350] Out: 0   Physical Exam: General: On ventilator  Head: Normocephalic, atraumatic.  Endotracheal tube in place  Neck: Supple  Lungs:  Clear to auscultation, normal effort  Heart: S1S2 no rubs  Abdomen:  Soft, nontender, bowel sounds present  Extremities: Trace peripheral edema.  Neurologic: Intubated  Skin: No acute rash  Access: Right IJ PermCath    Basic Metabolic Panel: Recent Labs  Lab 01/29/24 0711 01/29/24 1150 01/30/24 0339  NA 141  --  138  K 5.2*  --  3.4*  CL 103  --  96*  CO2 24  --  26  GLUCOSE 114*  --  88  BUN 69*  --  33*  CREATININE 9.37* 9.51* 5.38*  CALCIUM  8.2*  --  7.5*  MG  --   --  1.9  PHOS  --   --  4.0    Liver Function Tests: Recent Labs  Lab 01/29/24 0711 01/30/24 0339  AST 31 22  ALT 25 17  ALKPHOS 99 71  BILITOT 0.4 0.4  PROT 7.6 5.9*  ALBUMIN 4.1 3.2*   No results for input(s): LIPASE, AMYLASE in the last 168  hours. Recent Labs  Lab 01/29/24 1150  AMMONIA 50*    CBC: Recent Labs  Lab 01/29/24 0711 01/29/24 1150 01/30/24 0339  WBC 10.2 10.4 7.4  NEUTROABS 8.9*  --   --   HGB 10.8* 10.1* 9.3*  HCT 36.3 35.5* 30.4*  MCV 100.6* 105.3* 98.7  PLT 170 164 122*    Cardiac Enzymes: No results for input(s): CKTOTAL, CKMB, CKMBINDEX, TROPONINI in the last 168 hours.  BNP: Invalid input(s): POCBNP  CBG: Recent Labs  Lab 01/29/24 1241 01/29/24 1611 01/30/24 0008 01/30/24 0747 01/30/24 1128  GLUCAP 130* 114* 82 93 95    Microbiology: Results for orders placed or performed during the hospital encounter of 01/29/24  Blood Culture (routine x 2)     Status: None (Preliminary result)   Collection Time: 01/29/24  7:11 AM   Specimen: BLOOD  Result Value Ref Range Status   Specimen Description BLOOD BLOOD LEFT HAND  Final   Special Requests   Final    BOTTLES DRAWN AEROBIC AND ANAEROBIC Blood Culture adequate volume   Culture   Final    NO GROWTH 1 DAY Performed at [facility], [address], [place], [place] [address]    Report Status PENDING  Incomplete  Blood Culture (routine x 2)  Status: None (Preliminary result)   Collection Time: 01/29/24  7:12 AM   Specimen: BLOOD  Result Value Ref Range Status   Specimen Description BLOOD RIGHT ANTECUBITAL  Final   Special Requests   Final    BOTTLES DRAWN AEROBIC AND ANAEROBIC Blood Culture adequate volume   Culture   Final    NO GROWTH 1 DAY Performed at [facility], [address] [place], [place] [address]    Report Status PENDING  Incomplete  Resp panel by RT-PCR (RSV, Flu A&B, Covid) Anterior Nasal Swab     Status: None   Collection Time: 01/29/24  8:35 AM   Specimen: Anterior Nasal Swab  Result Value Ref Range Status   SARS Coronavirus 2 by RT PCR NEGATIVE NEGATIVE Final    Comment: (NOTE) SARS-CoV-2 target nucleic acids are NOT DETECTED.  The SARS-CoV-2 RNA is generally detectable in  upper respiratory specimens during the acute phase of infection. The lowest concentration of SARS-CoV-2 viral copies this assay can detect is 138 copies/mL. A negative result does not preclude SARS-Cov-2 infection and should not be used as the sole basis for treatment or other patient management decisions. A negative result may occur with  improper specimen collection/handling, submission of specimen other than nasopharyngeal swab, presence of viral mutation(s) within the areas targeted by this assay, and inadequate number of viral copies(<138 copies/mL). A negative result must be combined with clinical observations, patient history, and epidemiological information. The expected result is Negative.  Fact Sheet for Patients:  bloggercourse.com  Fact Sheet for Healthcare Providers:  seriousbroker.it  This test is no t yet approved or cleared by the [place]  FDA and  has been authorized for detection and/or diagnosis of SARS-CoV-2 by FDA under an Emergency Use Authorization (EUA). This EUA will remain  in effect (meaning this test can be used) for the duration of the COVID-19 declaration under Section 564(b)(1) of the Act, 21 U.S.C.section 360bbb-3(b)(1), unless the authorization is terminated  or revoked sooner.       Influenza A by PCR NEGATIVE NEGATIVE Final   Influenza B by PCR NEGATIVE NEGATIVE Final    Comment: (NOTE) The Xpert Xpress SARS-CoV-2/FLU/RSV plus assay is intended as an aid in the diagnosis of influenza from Nasopharyngeal swab specimens and should not be used as a sole basis for treatment. Nasal washings and aspirates are unacceptable for Xpert Xpress SARS-CoV-2/FLU/RSV testing.  Fact Sheet for Patients: bloggercourse.com  Fact Sheet for Healthcare Providers: seriousbroker.it  This test is not yet approved or cleared by the [place]  FDA and has been  authorized for detection and/or diagnosis of SARS-CoV-2 by FDA under an Emergency Use Authorization (EUA). This EUA will remain in effect (meaning this test can be used) for the duration of the COVID-19 declaration under Section 564(b)(1) of the Act, 21 U.S.C. section 360bbb-3(b)(1), unless the authorization is terminated or revoked.     Resp Syncytial Virus by PCR NEGATIVE NEGATIVE Final    Comment: (NOTE) Fact Sheet for Patients: bloggercourse.com  Fact Sheet for Healthcare Providers: seriousbroker.it  This test is not yet approved or cleared by the [place]  FDA and has been authorized for detection and/or diagnosis of SARS-CoV-2 by FDA under an Emergency Use Authorization (EUA). This EUA will remain in effect (meaning this test can be used) for the duration of the COVID-19 declaration under Section 564(b)(1) of the Act, 21 U.S.C. section 360bbb-3(b)(1), unless the authorization is terminated or revoked.  Performed at [facility], [address] [place],  [place] [address]   MRSA Next Gen by PCR, Nasal     Status: None   Collection Time: 01/29/24  4:09 PM   Specimen: Urine, Catheterized; Nasal Swab  Result Value Ref Range Status   MRSA by PCR Next Gen NOT DETECTED NOT DETECTED Final    Comment: (NOTE) The GeneXpert MRSA Assay (FDA approved for NASAL specimens only), is one component of a comprehensive MRSA colonization surveillance program. It is not intended to diagnose MRSA infection nor to guide or monitor treatment for MRSA infections. Test performance is not FDA approved in patients less than 25 years old. Performed at [facility], [address] [place], [place] [address]   Respiratory (~20 pathogens) panel by PCR     Status: Abnormal   Collection Time: 01/29/24  4:34 PM   Specimen: Urine, Catheterized; Respiratory  Result Value Ref Range Status   Adenovirus NOT DETECTED NOT DETECTED  Final   Coronavirus 229E NOT DETECTED NOT DETECTED Final    Comment: (NOTE) The Coronavirus on the Respiratory Panel, DOES NOT test for the novel  Coronavirus (2019 nCoV)    Coronavirus HKU1 NOT DETECTED NOT DETECTED Final   Coronavirus NL63 NOT DETECTED NOT DETECTED Final   Coronavirus OC43 NOT DETECTED NOT DETECTED Final   Metapneumovirus NOT DETECTED NOT DETECTED Final   Rhinovirus / Enterovirus DETECTED (A) NOT DETECTED Final   Influenza A NOT DETECTED NOT DETECTED Final   Influenza B NOT DETECTED NOT DETECTED Final   Parainfluenza Virus 1 NOT DETECTED NOT DETECTED Final   Parainfluenza Virus 2 NOT DETECTED NOT DETECTED Final   Parainfluenza Virus 3 NOT DETECTED NOT DETECTED Final   Parainfluenza Virus 4 NOT DETECTED NOT DETECTED Final   Respiratory Syncytial Virus NOT DETECTED NOT DETECTED Final   Bordetella pertussis NOT DETECTED NOT DETECTED Final   Bordetella Parapertussis NOT DETECTED NOT DETECTED Final   Chlamydophila pneumoniae NOT DETECTED NOT DETECTED Final   Mycoplasma pneumoniae NOT DETECTED NOT DETECTED Final    Comment: Performed at [facility], [address] [place], [place] [address]    Coagulation Studies: Recent Labs    01/29/24 0711 01/30/24 0339  LABPROT 14.4 14.9  INR 1.1 1.1    Urinalysis: Recent Labs    01/29/24 1633  COLORURINE YELLOW*  LABSPEC 1.014  PHURINE 5.0  GLUCOSEU NEGATIVE  HGBUR LARGE*  BILIRUBINUR NEGATIVE  KETONESUR NEGATIVE  PROTEINUR 100*  NITRITE NEGATIVE  LEUKOCYTESUR SMALL*      Imaging: ECHOCARDIOGRAM COMPLETE Result Date: 01/30/2024    ECHOCARDIOGRAM REPORT   Patient Name:   Anna Key Pinkard Date of Exam: 01/30/2024 Medical Rec #:  [id number]          Height:       64.0 in Accession #:    [id number]         Weight:       152.8 lb Date of Birth:  05-Dec-1950          BSA:          1.745 m Patient Age:    73 years           BP:           133/70 mmHg Patient Gender: F                  HR:           86 bpm. Exam  Location:  [facility] Procedure: 2D Echo, Cardiac Doppler and Color Doppler (Both Spectral and Color  Flow Doppler were utilized during procedure). Indications:     CHF  History:         Patient has prior history of Echocardiogram examinations, most                  recent 09/20/2020. CHF, CAD; Risk Factors:Hypertension and                  Dyslipidemia. CKD.  Sonographer:     Philomena Daring Referring Phys:  [id number] XZDYJA PAUDEL Diagnosing Phys: Evalene Lunger MD IMPRESSIONS  1. Left ventricular ejection fraction, by estimation, is 60 to 65%. The left ventricle has normal function. The left ventricle has no regional wall motion abnormalities. There is mild left ventricular hypertrophy. Left ventricular diastolic parameters are consistent with Grade I diastolic dysfunction (impaired relaxation).  2. Right ventricular systolic function is normal. The right ventricular size is mildly enlarged. There is moderately elevated pulmonary artery systolic pressure. The estimated right ventricular systolic pressure is 53.7 mmHg.  3. The mitral valve is normal in structure. No evidence of mitral valve regurgitation. No evidence of mitral stenosis.  4. Tricuspid valve regurgitation is mild to moderate.  5. The aortic valve is calcified. Aortic valve regurgitation is moderate. Moderate aortic valve stenosis. Aortic valve mean gradient measures 19.8 mmHg. Aortic valve Vmax measures 3.00 m/s.  6. The inferior vena cava is dilated in size with <50% respiratory variability, suggesting right atrial pressure of 15 mmHg. FINDINGS  Left Ventricle: Left ventricular ejection fraction, by estimation, is 60 to 65%. The left ventricle has normal function. The left ventricle has no regional wall motion abnormalities. Strain was performed and the global longitudinal strain is indeterminate. The left ventricular internal cavity size was normal in size. There is mild left ventricular hypertrophy. Left ventricular diastolic parameters are  consistent with Grade I diastolic dysfunction (impaired relaxation). Right Ventricle: The right ventricular size is mildly enlarged. No increase in right ventricular wall thickness. Right ventricular systolic function is normal. There is moderately elevated pulmonary artery systolic pressure. The tricuspid regurgitant velocity is 3.11 m/s, and with an assumed right atrial pressure of 15 mmHg, the estimated right ventricular systolic pressure is 53.7 mmHg. Left Atrium: Left atrial size was normal in size. Right Atrium: Right atrial size was normal in size. Pericardium: There is no evidence of pericardial effusion. Mitral Valve: The mitral valve is normal in structure. No evidence of mitral valve regurgitation. No evidence of mitral valve stenosis. Tricuspid Valve: The tricuspid valve is normal in structure. Tricuspid valve regurgitation is mild to moderate. No evidence of tricuspid stenosis. Aortic Valve: The aortic valve is calcified. Aortic valve regurgitation is moderate. Aortic regurgitation PHT measures 556 msec. Moderate aortic stenosis is present. Aortic valve mean gradient measures 19.8 mmHg. Aortic valve peak gradient measures 35.9 mmHg. Aortic valve area, by VTI measures 2.21 cm. Pulmonic Valve: The pulmonic valve was normal in structure. Pulmonic valve regurgitation is not visualized. No evidence of pulmonic stenosis. Aorta: The aortic root is normal in size and structure. Venous: The inferior vena cava is dilated in size with less than 50% respiratory variability, suggesting right atrial pressure of 15 mmHg. IAS/Shunts: No atrial level shunt detected by color flow Doppler. Additional Comments: 3D was performed not requiring image post processing on an independent workstation and was indeterminate.  LEFT VENTRICLE PLAX 2D LVIDd:         4.30 cm   Diastology LVIDs:         2.60 cm   LV  e' medial:    3.81 cm/s LV PW:         1.10 cm   LV E/e' medial:  27.3 LV IVS:        1.40 cm   LV e' lateral:   5.22 cm/s  LVOT diam:     1.90 cm   LV E/e' lateral: 19.9 LV SV:         109 LV SV Index:   63 LVOT Area:     2.84 cm  RIGHT VENTRICLE             IVC RV Basal diam:  3.70 cm     IVC diam: 2.50 cm RV S prime:     16.30 cm/s TAPSE (M-mode): 2.7 cm LEFT ATRIUM             Index        RIGHT ATRIUM           Index LA diam:        3.10 cm 1.78 cm/m   RA Area:     25.00 cm LA Vol (A2C):   49.5 ml 28.37 ml/m  RA Volume:   83.60 ml  47.91 ml/m LA Vol (A4C):   51.4 ml 29.46 ml/m LA Biplane Vol: 55.4 ml 31.75 ml/m  AORTIC VALVE AV Area (Vmax):    2.04 cm AV Area (Vmean):   2.06 cm AV Area (VTI):     2.21 cm AV Vmax:           299.50 cm/s AV Vmean:          203.500 cm/s AV VTI:            0.494 m AV Peak Grad:      35.9 mmHg AV Mean Grad:      19.8 mmHg LVOT Vmax:         216.00 cm/s LVOT Vmean:        148.000 cm/s LVOT VTI:          0.386 m LVOT/AV VTI ratio: 0.78 AI PHT:            556 msec  AORTA Ao Root diam: 3.00 cm MITRAL VALVE                TRICUSPID VALVE MV Area (PHT): 3.23 cm     TR Peak grad:   38.7 mmHg MV Decel Time: 235 msec     TR Vmax:        311.00 cm/s MV E velocity: 104.00 cm/s MV A velocity: 138.00 cm/s  SHUNTS MV E/A ratio:  0.75         Systemic VTI:  0.39 m                             Systemic Diam: 1.90 cm Evalene Lunger MD Electronically signed by Evalene Lunger MD Signature Date/Time: 01/30/2024/2:15:57 PM    Final    DG Chest Port 1 View Result Date: 01/30/2024 CLINICAL DATA:  Acute respiratory failure with hypoxia hypercarbia EXAM: PORTABLE CHEST 1 VIEW COMPARISON:  Yesterday FINDINGS: Stable cardiomegaly. Endotracheal and nasogastric tubes are in grossly good position. Stable mild bibasilar opacities are noted. Bony thorax is unremarkable. IMPRESSION: Stable support apparatus. Stable bibasilar opacities. Electronically Signed   By: Lynwood Landy Raddle M.D.   On: 01/30/2024 09:02   DG Abd 1 View Result Date: 01/29/2024 CLINICAL DATA:  Enteric tube placement. EXAM: ABDOMEN - 1 VIEW COMPARISON:  None Available. FINDINGS: Enteric tube with tip and side-port in the left upper abdomen in the proximal stomach. IMPRESSION: Enteric tube with tip in the proximal stomach. Electronically Signed   By: Vanetta Chou M.D.   On: 01/29/2024 19:47   DG Chest Portable 1 View Result Date: 01/29/2024 CLINICAL DATA:  Post intubation. EXAM: PORTABLE CHEST 1 VIEW COMPARISON:  Chest radiograph dated 01/29/2024. FINDINGS: Endotracheal tube approximately 4 cm above the carina. Enteric tube with tip in the proximal stomach and side port distal esophagus or GE junction. Recommend further advancing by additional 7 cm. Bilateral pulmonary opacities as seen on the earlier radiograph. No pneumothorax. Stable cardiomegaly. No acute osseous pathology. IMPRESSION: 1. Endotracheal tube above the carina. 2. Enteric tube with tip in the proximal stomach and side port distal esophagus or GE junction. Recommend further advancing by additional 7 cm. Electronically Signed   By: Vanetta Chou M.D.   On: 01/29/2024 15:15   CT HEAD WO CONTRAST ( ) Result Date: 01/29/2024 CLINICAL DATA:  Altered mental status. EXAM: CT HEAD WITHOUT CONTRAST TECHNIQUE: Contiguous axial images were obtained from the base of the skull through the vertex without intravenous contrast. RADIATION DOSE REDUCTION: This exam was performed according to the departmental dose-optimization program which includes automated exposure control, adjustment of the mA and/or kV according to patient size and/or use of iterative reconstruction technique. COMPARISON:  02/03/2006 FINDINGS: Brain: Cavum septum variant is present. Ventricles, cisterns and other CSF spaces are otherwise unremarkable. Minimal bilateral basal ganglia calcifications are present. There is mild chronic ischemic microvascular disease. No mass, mass effect, shift of midline structures or acute hemorrhage. No evidence of acute infarction. Vascular: No hyperdense vessel or unexpected calcification. Skull:  Normal. Negative for fracture or focal lesion. Sinuses/Orbits: Orbits are normal symmetric. Paranasal sinuses are clear as there are hypoplastic frontal sinuses. Other: None. IMPRESSION: 1. No acute findings. 2. Mild chronic ischemic microvascular disease. Electronically Signed   By: Toribio Agreste M.D.   On: 01/29/2024 13:51   DG Chest Portable 1 View Result Date: 01/29/2024 CLINICAL DATA:  Shortness of breath. EXAM: PORTABLE CHEST 1 VIEW COMPARISON:  01/29/2024 FINDINGS: Lungs are adequately inflated with persistent bibasilar opacification which may be due to vascular congestion versus atelectasis or infection. No significant effusion. Stable cardiomegaly. Remainder of the exam is unchanged. IMPRESSION: Persistent bibasilar opacification which may be due to vascular congestion versus atelectasis or infection. Electronically Signed   By: Toribio Agreste M.D.   On: 01/29/2024 13:41   DG Chest Port 1 View Result Date: 01/29/2024 EXAM: 1 VIEW(S) XRAY OF THE CHEST 01/29/2024 07:37:00 AM COMPARISON: 05/20/2023 CLINICAL HISTORY: Questionable sepsis - evaluate for abnormality FINDINGS: LUNGS AND PLEURA: Increased interstitial markings. Trace bilateral pleural effusions. No focal pulmonary opacity. No pneumothorax. HEART AND MEDIASTINUM: Cardiomegaly. Aortic calcification. BONES AND SOFT TISSUES: No acute osseous abnormality. IMPRESSION: 1. Increased interstitial markings and trace bilateral pleural effusions concerning for mild congestive heart failure. . Electronically signed by: Waddell Calk MD 01/29/2024 07:52 AM EST RP Workstation: [id number]     Medications:    sodium chloride  10 mL/hr at 01/30/24 1346   azithromycin  Stopped (01/30/24 1227)   cefTRIAXone  (ROCEPHIN )  IV Stopped (01/30/24 1113)   norepinephrine  (LEVOPHED ) Adult infusion Stopped (01/29/24 2355)    aspirin  EC  325 mg Oral Daily   Chlorhexidine  Gluconate Cloth  6 each Topical Q0600   docusate  100 mg Per Tube BID   heparin   5,000 Units  Subcutaneous Q8H   ipratropium-albuterol   3 mL Nebulization  Q6H   methylPREDNISolone  (SOLU-MEDROL ) injection  40 mg Intravenous Daily   mouth rinse  15 mL Mouth Rinse Q2H   pantoprazole  (PROTONIX ) IV  40 mg Intravenous Daily   polyethylene glycol  17 g Per Tube Daily   acetaminophen  **OR** acetaminophen , midazolam  PF, ondansetron  **OR** ondansetron  (ZOFRAN ) IV, mouth rinse, polyethylene glycol  Assessment/ Plan:  73 y.o. female with past medical history of ESRD on HD TTS, COPD on home oxygen  2 L, CHF, hepatitis C, hypertension, upper lipidemia, anemia chronic kidney disease, secondary hyperparathyroidism who came in with lethargy and ultimately found to have acute respiratory failure.  1.  ESRD on HD TTS.  Patient completed hemodialysis treatment early this a.m.  UF achieved was 0.6 kg.  Reassess patient for dialysis again tomorrow.  2.  Acute respiratory failure.  Patient maintained on ventilatory support.  Currently on ceftriaxone  as well.  3.  Anemia chronic kidney disease.   Lab Results  Component Value Date   HGB 9.3 (L) 01/30/2024  Hemoglobin down to 9.3.  Continue to monitor for now.   4.  Secondary hyperparathyroidism.  Phosphorus 4.0 and acceptable.   LOS: 1 Shaune Malacara 12/18/20255:17 PM

## 2024-01-30 NOTE — Progress Notes (Signed)
 NAME:  Anna Key, MRN:  [id number], DOB:  05-15-1950, LOS: 1 ADMISSION DATE:  01/29/2024, CONSULTATION DATE:  01/29/2024 REFERRING MD:  Dr. Roann, CHIEF COMPLAINT:  Shortness of breath, hypoxia, altered mental status   Brief Pt Description / Synopsis:  73 y.o. female with PMHx significant for ESRD on Hemodialysis, COPD requiring 2L supplemental oxygen , HFpEF, and Hepatitis C who is admitted with Acute Metabolic Encephalopathy, CO2 Narcosis, and Acute on Chronic Hypoxic and Hypercapnic Respiratory Failure due to Volume Overload from missed dialysis, Acute COPD Exacerbation due to Rhinovirus infection, and questionable superimposed bacterial pneumonia.  Failed trial of BiPAP requiring intubation and mechanical ventilation.    History of Present Illness:  Anna Key is a pleasant 73 y.o. female with medical history significant for COPD on home oxygen  2 L via nasal cannula, ESRD on hemodialysis, anemia, CHF, hep C, HTN, HLD who was brought into the ED on 01/29/24 for concern for shortness of breath, cough and confusion.  Patient is currently intubated and sedated and unable to contribute to history, therefore history is obtained from chart review.   It is reported that EMS found her hypoxic with O2 saturations of 82% on room air, confused and lethargic.  Reportedly the patient endorsed a productive cough for a few days along with audible wheezing.  ED Course: Initial Vital Signs: Temperature 98.6 F orally, RR 21, pulse 83, BP 176/91, SpO2 80% on room air Significant Labs: Potassium 5.2, glucose 114, BUN 69, Creatinine 9.3, Pro-BNP >35000, lactic acid 0.9, procalcitonin 0.48, WBC 10.2 Imaging Chest X-ray>>IMPRESSION: 1. Increased interstitial markings and trace bilateral pleural effusions concerning for mild congestive heart failure. . CT Head wo contrast>>IMPRESSION: 1. No acute findings. 2. Mild chronic ischemic microvascular disease. Medications Administered: 80 mg IV Lasix ,  Levaquin , duonebs, 500 cc NS bolus   TRH asked to admit for further workup and treatment.  Found to be Hypercapnic and was placed on BiPAP, but ultimately with poor tolerance of BiPAP with worsening hypoxia, required intubation in the ED.  PCCM consulted.   Please see Significant Hospital Events section below for full detailed hospital course.   Pertinent  Medical History   Past Medical History:  Diagnosis Date   Anemia    Arthritis    osteoarthritis of right knee   CHF (congestive heart failure) (HCC)    Chronic kidney disease    stage 3   Gout    Hepatitis C antibody test positive    Hyperlipidemia    Hypertension     Micro Data:  12/17: COVID/FLU/RSV PCR>> negative 12/17: RVP>> + Rhinovirus  12/17: Blood cultures x2>> no growth to date 12/17; Sputum>> 12/17: MRSA PCR>>negative  12/17: Strep pneumo urinary antigen>>negative  12/17: Legionella urinary antigen>>  Antimicrobials:   Anti-infectives (From admission, onward)    Start     Dose/Rate Route Frequency Ordered Stop   01/30/24 1000  cefTRIAXone  (ROCEPHIN ) 2 g in sodium chloride  0.9 % 100 mL IVPB        2 g 200 mL/hr over 30 Minutes Intravenous Every 24 hours 01/29/24 1157 02/04/24 0959   01/30/24 1000  azithromycin  (ZITHROMAX ) 500 mg in sodium chloride  0.9 % 250 mL IVPB        500 mg 250 mL/hr over 60 Minutes Intravenous Every 24 hours 01/29/24 1157 02/04/24 0959   01/29/24 0815  levofloxacin  (LEVAQUIN ) IVPB 750 mg        750 mg 100 mL/hr over 90 Minutes Intravenous  Once 01/29/24 0802 01/29/24 1005  Significant Hospital Events: Including procedures, antibiotic start and stop dates in addition to other pertinent events   12/17: Presented to ED due to shortness of breath and AMS.  Placed on BiPAP, TRH asked to admit with plans for urgent HD.  Remained hypoxic and altered despite BiPAP requiring intubation.  PCCM consulted.  12/18: Received HD last night, Viral panel positive for Rhinovirus.  On minimal  vent support, plan for WUA & SBT as tolerated utilizing Precedex .   Interim History / Subjective:  As outlined above under Significant Hospital Events section  Objective   Blood pressure (!) 97/56, pulse 78, temperature 99.5 F (37.5 C), resp. rate (!) 22, height 5' 4 (1.626 m), weight 69.3 kg, SpO2 94%.    Vent Mode: PRVC FiO2 (%):  [28 %-40 %] 28 % Set Rate:  [22 bmp] 22 bmp Vt Set:  [450 mL] 450 mL PEEP:  [5 cmH20] 5 cmH20   Intake/Output Summary (Last 24 hours) at 01/30/2024 9257 Last data filed at 01/30/2024 0500 Gross per 24 hour  Intake 929.82 ml  Output 640 ml  Net 289.82 ml   Filed Weights   01/29/24 0707 01/29/24 1600  Weight: 72.2 kg 69.3 kg    Examination: General: Critically ill appearing on chronically ill appearing female, laying in bed, intubated, lightly sedated, NAD HENT: Atraumatic, normocephalic, neck supple, + JVD, orally intubated Lungs: Coarse breath sounds throughout, even, overbreathing the vent Cardiovascular: Tachycardia, regular rhythm, s1s2, no M/R/G Abdomen: Soft, nontender, nondistended, no guarding or rebound tenderness, BS + x4 Extremities: Normal bulk and tone, no deformities, 1+ edema BLE Neuro: Lightly sedated, wakes up and follows commands, and  moves all extremities purposefully, no focal deficits noted PERRL GU: deferred   Resolved Hospital Problem list     Assessment & Plan:   #Acute on Chronic Hypoxic & Hypercapnic Respiratory Failure #Volume Overload #Acute COPD Exacerbation #Questionable Pneumonia  #Bilateral Pleural Effusions -Full vent support, implement lung protective strategies -Plateau pressures less than 30 cm H20 -Wean FiO2 & PEEP as tolerated to maintain O2 sats >92% -Follow intermittent Chest X-ray & ABG as needed -Spontaneous Breathing Trials when respiratory parameters met and mental status permits -Implement VAP Bundle -Bronchodilators -IV Steroids -ABX as above -Volume removal with HD  #Acute  Decompensated HFpEF #Hypertension PMHx: HLD Echocardiogram 09/20/20: LVEF 70-75%, Grade I Diastolic dysfunction, RV systolic function normal, severely elevated pulmonary artery systolic pressure -Continuous cardiac monitoring -Maintain MAP >65 -Vasopressors as needed to maintain MAP goal ~ currently not requiring  -Echocardiogram pending -Volume removal with HD -Hold home antihypertensives for now  #Rhinovirus Infection #Questionable Superimposed Bacterial Pneumonia #Questionable UTI -Monitor fever curve -Trend WBC's & Procalcitonin -Follow cultures as above -Continue empiric Azithromycin  & Ceftriaxone  pending cultures & sensitivities  #ESRD on Hemodialysis  #Mild Hyperkalemia -Monitor I&O's / urinary output -Follow BMP -Ensure adequate renal perfusion -Avoid nephrotoxic agents as able -Replace electrolytes as indicated ~ Pharmacy following for assistance with electrolyte replacement -Nephrology consulted, appreciate input ~ HD as per Nephrology   #Anemia of chronic disease -Monitor for S/Sx of bleeding -Trend CBC -Heparin  SQ for VTE Prophylaxis  -Transfuse for Hgb <7  #Acute Metabolic Encephalopathy #CO2 Narcosis #Sedation needs in setting of mechanical ventilation CT Head negative  -Treatment of metabolic derangements and hypercapnia as outlined above  -Maintain a RASS goal of 0 to -1 -Fentanyl  and Propofol  to maintain RASS goal -Avoid sedating medications as able -Daily wake up assessment ~ Utilize Precedex  for [employer] (right click and  Reselect all SmartList Selections daily)   Diet/type: NPO DVT prophylaxis: prophylactic heparin   GI prophylaxis: PPI Lines: N/A Foley:  N/A Code Status:  full code Last date of multidisciplinary goals of care discussion [12/18]  12/18: Updated pt's emergency contact Sadie McAdoo (sister in law) via telephone on plan of care.  Labs   CBC: Recent Labs  Lab 01/29/24 0711 01/29/24 1150 01/30/24 0339  WBC 10.2  10.4 7.4  NEUTROABS 8.9*  --   --   HGB 10.8* 10.1* 9.3*  HCT 36.3 35.5* 30.4*  MCV 100.6* 105.3* 98.7  PLT 170 164 122*    Basic Metabolic Panel: Recent Labs  Lab 01/29/24 0711 01/29/24 1150 01/30/24 0339  NA 141  --  138  K 5.2*  --  3.4*  CL 103  --  96*  CO2 24  --  26  GLUCOSE 114*  --  88  BUN 69*  --  33*  CREATININE 9.37* 9.51* 5.38*  CALCIUM  8.2*  --  7.5*  MG  --   --  1.9  PHOS  --   --  4.0   GFR: Estimated Creatinine Clearance: 9 mL/min (A) (by C-G formula based on SCr of 5.38 mg/dL (H)). Recent Labs  Lab 01/29/24 0711 01/29/24 1150 01/29/24 1157 01/30/24 0339  PROCALCITON  --   --  0.48  --   WBC 10.2 10.4  --  7.4  LATICACIDVEN 0.9  --   --   --     Liver Function Tests: Recent Labs  Lab 01/29/24 0711 01/30/24 0339  AST 31 22  ALT 25 17  ALKPHOS 99 71  BILITOT 0.4 0.4  PROT 7.6 5.9*  ALBUMIN 4.1 3.2*   No results for input(s): LIPASE, AMYLASE in the last 168 hours. Recent Labs  Lab 01/29/24 1150  AMMONIA 50*    ABG    Component Value Date/Time   PHART 7.4 01/30/2024 0444   PCO2ART 45 01/30/2024 0444   PO2ART 66 (L) 01/30/2024 0444   HCO3 27.9 01/30/2024 0444   TCO2 18 (L) 03/22/2021 0937   ACIDBASEDEF 4.3 (H) 01/29/2024 1500   O2SAT 95.7 01/30/2024 0444     Coagulation Profile: Recent Labs  Lab 01/29/24 0711 01/30/24 0339  INR 1.1 1.1    Cardiac Enzymes: No results for input(s): CKTOTAL, CKMB, CKMBINDEX, TROPONINI in the last 168 hours.  HbA1C: No results found for: HGBA1C  CBG: Recent Labs  Lab 01/29/24 1241 01/29/24 1611 01/30/24 0008  GLUCAP 130* 114* 82    Review of Systems:   Unable to assess due to intubation/sedation/critical illness    Past Medical History:  She,  has a past medical history of Anemia, Arthritis, CHF (congestive heart failure) (HCC), Chronic kidney disease, Gout, Hepatitis C antibody test positive, Hyperlipidemia, and Hypertension.   Surgical History:   Past  Surgical History:  Procedure Laterality Date   A/V FISTULAGRAM N/A 09/22/2020   Procedure: A/V Fistulagram;  Surgeon: Marea Selinda RAMAN, MD;  Location: [facility];  Service: Cardiovascular;  Laterality: N/A;   AV FISTULA PLACEMENT Left 11/27/2019   Procedure: ARTERIOVENOUS (AV) Brachiocephalic FISTULA CREATION, possible graft;  Surgeon: Jama Cordella MATSU, MD;  Location: [facility];  Service: Vascular;  Laterality: Left;   CARDIAC CATHETERIZATION     COLONOSCOPY WITH PROPOFOL  N/A 03/22/2021   Procedure: COLONOSCOPY WITH PROPOFOL ;  Surgeon: Toledo, Ladell POUR, MD;  Location: [facility];  Service: Gastroenterology;  Laterality: N/A;   DIAGNOSTIC LAPAROSCOPY     ESOPHAGOGASTRODUODENOSCOPY N/A 03/22/2021  Procedure: ESOPHAGOGASTRODUODENOSCOPY (EGD);  Surgeon: Toledo, Ladell POUR, MD;  Location: [facility];  Service: Gastroenterology;  Laterality: N/A;   LAPAROSCOPIC LYSIS OF ADHESIONS  08/12/2019   Procedure: LAPAROSCOPIC LYSIS OF ADHESIONS;  Surgeon: Jordis Laneta FALCON, MD;  Location: [facility];  Service: General;;   PARTIAL KNEE ARTHROPLASTY Right 06/11/2017   Procedure: UNICOMPARTMENTAL KNEE;  Surgeon: Edie Norleen PARAS, MD;  Location: [facility];  Service: Orthopedics;  Laterality: Right;   REPLACEMENT TOTAL KNEE Left 2012   TEMPORARY DIALYSIS CATHETER  09/20/2020   Procedure: TEMPORARY DIALYSIS CATHETER;  Surgeon: Jama Cordella MATSU, MD;  Location: [facility];  Service: Cardiovascular;;     Social History:   reports that she has been smoking cigarettes. She has never used smokeless tobacco. She reports that she does not currently use drugs. She reports that she does not drink alcohol.   Family History:  Her family history includes Colon cancer in her mother.   Allergies Allergies[1]   Home Medications  Prior to Admission medications  Medication Sig Start Date End Date Taking? Authorizing Provider  allopurinol  (ZYLOPRIM ) 100 MG tablet Take 100 mg by mouth every morning.    Yes  [provider]  amLODipine  (NORVASC ) 10 MG tablet Take 1 tablet (10 mg total) by mouth daily. 09/24/20 01/29/24 Yes Dahal, Chapman, MD  aspirin  EC 325 MG EC tablet Take 1 tablet (325 mg total) by mouth daily. 09/25/20  Yes Dahal, Chapman, MD  isosorbide  mononitrate (IMDUR ) 60 MG 24 hr tablet Take 1 tablet (60 mg total) by mouth every morning. 09/24/20 01/29/24 Yes Dahal, Chapman, MD  metoprolol  (TOPROL -XL) 200 MG 24 hr tablet Take 1 tablet (200 mg total) by mouth every morning. 09/24/20 01/29/24 Yes Dahal, Chapman, MD  atorvastatin  (LIPITOR) 40 MG tablet Take 40 mg by mouth every morning.  Patient not taking: Reported on 01/29/2024    [provider]  calcitRIOL  (ROCALTROL ) 0.25 MCG capsule Take 0.25 mcg by mouth daily. Patient not taking: Reported on 01/29/2024 11/06/19   [provider]  Calcium  500-100 MG-UNIT CHEW Chew 500 mg by mouth. Patient not taking: Reported on 01/29/2024    [provider]  losartan  (COZAAR ) 25 MG tablet Take 1 tablet (25 mg total) by mouth at bedtime. 09/24/20 12/23/20  Arlice Chapman, MD  losartan  (COZAAR ) 25 MG tablet Take 1 tablet by mouth at bedtime. Patient not taking: Reported on 11/27/2021 11/01/20   [provider]  polyethylene glycol-electrolytes (NULYTELY ) 420 g solution Take by mouth. Patient not taking: Reported on 11/27/2021 09/08/20   [provider]  torsemide  40 MG TABS Take 40 mg by mouth daily. Patient not taking: Reported on 07/21/2021 09/25/20 12/28/20  Arlice Chapman, MD     Critical care time: 40 minutes     Inge Lecher, AGACNP-BC [facility] Prefer epic messenger for cross cover needs If after hours, please call E-link        [1]  Allergies Allergen Reactions   Penicillins Hives and Rash    Has patient had a PCN reaction causing immediate rash, facial/tongue/throat swelling, SOB or lightheadedness with hypotension: Yes Has patient had a PCN reaction causing severe  rash involving mucus membranes or skin necrosis: Yes--all over body Has patient had a PCN reaction that required hospitalization: No  Has patient had a PCN reaction occurring within the last 10 years: No If all of the above answers are NO, then may proceed with Cephalosporin use.

## 2024-01-30 NOTE — Progress Notes (Signed)
 Pt has been in PSV for about 30 minutes.  Is currently awake and alert, awake to follow all commands.  RR low 20's TV's 400's.  No appreciable secretions from ETT.  Discussed with Dr. Isaiah, will extubate to [facility].    Inge Lecher, AGACNP-BC  Pulmonary & Critical Care Prefer epic messenger for cross cover needs If after hours, please call E-link

## 2024-01-30 NOTE — Progress Notes (Signed)
 Patient transitioned from propofol  and fentanyl  to precedex . SBT initiated by RT with RN at bedside. Patient RR 5-7 bpm, increased agitation. NP to bedside to assess patient. Patient placed back on support while receiving precedex . Patient remains on support at this time and will attempt SBT again today when patient is more alert. Patient drowsy and re-direct able at this time.

## 2024-01-30 NOTE — Progress Notes (Signed)
 PHARMACY CONSULT NOTE - FOLLOW UP  Pharmacy Consult for Electrolyte Monitoring and Replacement   Recent Labs: Potassium (mmol/L)  Date Value  01/30/2024 3.4 (L)  08/29/2011 3.5   Magnesium  (mg/dL)  Date Value  87/81/7974 1.9   Calcium  (mg/dL)  Date Value  87/81/7974 7.5 (L)   Calcium , Total (mg/dL)  Date Value  92/82/7986 9.2   Albumin (g/dL)  Date Value  87/81/7974 3.2 (L)   Phosphorus (mg/dL)  Date Value  87/81/7974 4.0   Sodium (mmol/L)  Date Value  01/30/2024 138  08/29/2011 140     Assessment: 73 y/o female with h/o HTN, HLD, CHF, ESRD on HD, hyperparathyroidism, s/p laparoscopic lysis of adhesions (2021), CAD, IDA, HCV and gout who is admitted with CHF with volume overlaod and bilateral pleural effusions, PNA, UTI and rhinovirus. Pharmacy is asked to follow and replace electrolytes while in CCU  Goal of Therapy:  Electrolytes WNL  Plan:  ---20 mEq KCl per tube x 1 ---recheck electrolytes in am  Adriana JONETTA Bolster ,PharmD Clinical Pharmacist 01/30/2024 7:27 AM

## 2024-01-30 NOTE — Progress Notes (Signed)
 Pt was extubated at 1450 per order.  Pt was intubated less than 24 hours so no cuff leak needed to be checked.  Pt was placed on 4L/[place].  Pt has strong cough will continue to monitor pt the remainder of the shift

## 2024-01-30 NOTE — Progress Notes (Signed)
°   01/29/24 2335  Vitals  Temp 97.9 F (36.6 C)  BP (!) 102/56  MAP (mmHg) 70  Pulse Rate (!) 58  ECG Heart Rate (!) 57  Resp (!) 22  Oxygen  Therapy  SpO2 100 %  During Treatment Monitoring  Blood Flow Rate (mL/min) 350 mL/min  Arterial Pressure (mmHg) -208.88 mmHg  Venous Pressure (mmHg) 245.64 mmHg  TMP (mmHg) 8.89 mmHg  Ultrafiltration Rate (mL/min) 458 mL/min  Dialysate Flow Rate (mL/min) 300 ml/min  Dialysate Potassium Concentration 2  Dialysate Calcium  Concentration 2.5  Duration of HD Treatment -hour(s) 3.45 hour(s)  Cumulative Fluid Removed (mL) per Treatment  577.35  HD Safety Checks Performed Yes  Intra-Hemodialysis Comments Tx completed  Post Treatment  Dialyzer Clearance Lightly streaked  Liters Processed 77.6  Fluid Removed (mL) 600 mL

## 2024-01-30 NOTE — Progress Notes (Signed)
 Initial Nutrition Assessment  DOCUMENTATION CODES:   Not applicable  INTERVENTION:   If patient does not extubate:  Vital 1.2@55ml /hr- Initiate at 45ml/hr and increase by 10ml/hr q 8 hours until goal rate is reached.   ProSource TF 20- Give 60ml daily via tube, each supplement provides 80kcal and 20g of protein.   Free water flushes 30ml q4 hours to maintain tube patency   Regimen provides 1664kcal/day, 119g/day protein and 1250ml/day of free water.   Pt at high refeed risk; recommend monitor potassium, magnesium  and phosphorus labs daily until stable  Rena-vit daily via tube   Juven Fruit Punch BID via tube, each serving provides 95kcal and 2.5g of protein (amino acids glutamine and arginine)  Daily weights   NUTRITION DIAGNOSIS:   Inadequate oral intake related to inability to eat (pt sedated and ventilated) as evidenced by NPO status.  GOAL:   Provide needs based on ASPEN/SCCM guidelines  MONITOR:   Vent status, Labs, Weight trends, Skin, I & O's  REASON FOR ASSESSMENT:   Ventilator    ASSESSMENT:   73 y/o female with h/o HTN, HLD, CHF, ESRD on HD, hyperparathyroidism, s/p laparoscopic lysis of adhesions (2021), CAD, IDA, HCV and gout who is admitted with CHF with volume overlaod and bilateral pleural effusions, PNA, UTI and rhinovirus.  Pt sedated and ventilated. OGT in place. Plan is for WUA and possible extubation today. Will plan to initiate tube feeds if pt does not extubate. Pt is likely at refeed risk. Per chart, pt is down 13lbs(8%) over the past 8 months; RD unsure how recently weight loss occurred.   Medications reviewed and include: aspirin , colace, heparin , solu-medrol , protonix , miralax , azithromycin , ceftriaxone   Labs reviewed: K 3.4(L), BUN 33(H), creat 5.38(H), P 4.0 wnl, Mg 1.9 wnl Pro BNP >35,000- 12/17 Hgb 9.3(L), Hct 30.4(L) Cbgs- 95, 93, 82 x 24 hrs   Patient is currently intubated on ventilator support MV: 9.6 L/min Temp (24hrs),  Avg:98.3 F (36.8 C), Min:93.8 F (34.3 C), Max:100.4 F (38 C)  MAP >24mmHg   UOP- 40ml   NUTRITION - FOCUSED PHYSICAL EXAM:  Flowsheet Row Most Recent Value  Orbital Region No depletion  Upper Arm Region No depletion  Thoracic and Lumbar Region No depletion  Buccal Region No depletion  Temple Region No depletion  Clavicle Bone Region No depletion  Clavicle and Acromion Bone Region No depletion  Scapular Bone Region No depletion  Dorsal Hand No depletion  Patellar Region Severe depletion  Anterior Thigh Region Severe depletion  Posterior Calf Region Severe depletion  Edema (RD Assessment) Mild  Hair Reviewed  Eyes Reviewed  Mouth Reviewed  Skin Reviewed  Nails Reviewed   Diet Order:   Diet Order             Diet NPO time specified  Diet effective now                  EDUCATION NEEDS:   No education needs have been identified at this time  Skin:  Skin Assessment: Reviewed RN Assessment (Stage II sacrum)  Last BM:  pta  Height:   Ht Readings from Last 1 Encounters:  01/29/24 5' 4 (1.626 m)    Weight:   Wt Readings from Last 1 Encounters:  01/29/24 69.3 kg    Ideal Body Weight:  54.5 kg  BMI:  Body mass index is 26.22 kg/m.  Estimated Nutritional Needs:   Kcal:  1577kcal/day  Protein:  105-120g/day  Fluid:  UOP +1L  Augustin Shams  MS, RD, LDN If unable to be reached, please send secure chat to RD inpatient available from 8:00a-4:00p daily

## 2024-01-31 DIAGNOSIS — J441 Chronic obstructive pulmonary disease with (acute) exacerbation: Secondary | ICD-10-CM

## 2024-01-31 DIAGNOSIS — J9601 Acute respiratory failure with hypoxia: Secondary | ICD-10-CM | POA: Diagnosis not present

## 2024-01-31 DIAGNOSIS — Z992 Dependence on renal dialysis: Secondary | ICD-10-CM

## 2024-01-31 DIAGNOSIS — L89152 Pressure ulcer of sacral region, stage 2: Secondary | ICD-10-CM | POA: Diagnosis not present

## 2024-01-31 DIAGNOSIS — I10 Essential (primary) hypertension: Secondary | ICD-10-CM | POA: Diagnosis not present

## 2024-01-31 DIAGNOSIS — N186 End stage renal disease: Secondary | ICD-10-CM

## 2024-01-31 DIAGNOSIS — D631 Anemia in chronic kidney disease: Secondary | ICD-10-CM | POA: Diagnosis not present

## 2024-01-31 DIAGNOSIS — L8990 Pressure ulcer of unspecified site, unspecified stage: Secondary | ICD-10-CM | POA: Insufficient documentation

## 2024-01-31 DIAGNOSIS — J9612 Chronic respiratory failure with hypercapnia: Secondary | ICD-10-CM | POA: Diagnosis not present

## 2024-01-31 DIAGNOSIS — I5031 Acute diastolic (congestive) heart failure: Secondary | ICD-10-CM

## 2024-01-31 DIAGNOSIS — E875 Hyperkalemia: Secondary | ICD-10-CM

## 2024-01-31 DIAGNOSIS — J188 Other pneumonia, unspecified organism: Secondary | ICD-10-CM | POA: Diagnosis not present

## 2024-01-31 DIAGNOSIS — G9341 Metabolic encephalopathy: Secondary | ICD-10-CM | POA: Diagnosis not present

## 2024-01-31 LAB — CBC
HCT: 31.6 % — ABNORMAL LOW (ref 36.0–46.0)
Hemoglobin: 9.9 g/dL — ABNORMAL LOW (ref 12.0–15.0)
MCH: 30.6 pg (ref 26.0–34.0)
MCHC: 31.3 g/dL (ref 30.0–36.0)
MCV: 97.5 fL (ref 80.0–100.0)
Platelets: 140 K/uL — ABNORMAL LOW (ref 150–400)
RBC: 3.24 MIL/uL — ABNORMAL LOW (ref 3.87–5.11)
RDW: 16.5 % — ABNORMAL HIGH (ref 11.5–15.5)
WBC: 10.6 K/uL — ABNORMAL HIGH (ref 4.0–10.5)
nRBC: 0.2 % (ref 0.0–0.2)

## 2024-01-31 LAB — RENAL FUNCTION PANEL
Albumin: 3.5 g/dL (ref 3.5–5.0)
Anion gap: 15 (ref 5–15)
BUN: 48 mg/dL — ABNORMAL HIGH (ref 8–23)
CO2: 26 mmol/L (ref 22–32)
Calcium: 7.3 mg/dL — ABNORMAL LOW (ref 8.9–10.3)
Chloride: 95 mmol/L — ABNORMAL LOW (ref 98–111)
Creatinine, Ser: 7.21 mg/dL — ABNORMAL HIGH (ref 0.44–1.00)
GFR, Estimated: 6 mL/min — ABNORMAL LOW
Glucose, Bld: 119 mg/dL — ABNORMAL HIGH (ref 70–99)
Phosphorus: 5 mg/dL — ABNORMAL HIGH (ref 2.5–4.6)
Potassium: 4.3 mmol/L (ref 3.5–5.1)
Sodium: 136 mmol/L (ref 135–145)

## 2024-01-31 LAB — MAGNESIUM: Magnesium: 2.2 mg/dL (ref 1.7–2.4)

## 2024-01-31 MED ORDER — ONDANSETRON HCL 4 MG PO TABS: 4.0000 mg | ORAL_TABLET | Freq: Four times a day (QID) | ORAL | Status: DC | PRN

## 2024-01-31 MED ORDER — NEPRO/CARBSTEADY PO LIQD
237.0000 mL | Freq: Three times a day (TID) | ORAL | Status: DC
  Administered 2024-01-31 – 2024-02-02 (×5): 237 mL via ORAL

## 2024-01-31 MED ORDER — POLYETHYLENE GLYCOL 3350 17 G PO PACK: 17.0000 g | PACK | Freq: Every day | ORAL | Status: DC | PRN

## 2024-01-31 MED ORDER — ALLOPURINOL 100 MG PO TABS
100.0000 mg | ORAL_TABLET | Freq: Every day | ORAL | Status: DC
  Administered 2024-02-01 – 2024-02-03 (×3): 100 mg via ORAL
  Filled 2024-01-31 (×3): qty 1

## 2024-01-31 MED ORDER — ORAL CARE MOUTH RINSE: 15.0000 mL | OROMUCOSAL | Status: DC | PRN

## 2024-01-31 MED ORDER — RENA-VITE PO TABS
1.0000 | ORAL_TABLET | Freq: Every day | ORAL | Status: DC
  Administered 2024-01-31 – 2024-02-02 (×3): 1 via ORAL
  Filled 2024-01-31 (×3): qty 1

## 2024-01-31 MED ORDER — ONDANSETRON HCL 4 MG/2ML IJ SOLN
4.0000 mg | Freq: Four times a day (QID) | INTRAMUSCULAR | Status: DC | PRN
  Administered 2024-01-31: 4 mg via INTRAVENOUS
  Filled 2024-01-31: qty 2

## 2024-01-31 MED ORDER — IPRATROPIUM-ALBUTEROL 0.5-2.5 (3) MG/3ML IN SOLN
3.0000 mL | Freq: Three times a day (TID) | RESPIRATORY_TRACT | Status: DC
  Administered 2024-01-31 – 2024-02-03 (×7): 3 mL via RESPIRATORY_TRACT
  Filled 2024-01-31 (×7): qty 3

## 2024-01-31 MED ORDER — ACETAMINOPHEN 325 MG PO TABS
650.0000 mg | ORAL_TABLET | Freq: Four times a day (QID) | ORAL | Status: DC | PRN
  Administered 2024-02-02: 650 mg via ORAL
  Filled 2024-01-31: qty 2

## 2024-01-31 MED ORDER — ACETAMINOPHEN 650 MG RE SUPP: 650.0000 mg | Freq: Four times a day (QID) | RECTAL | Status: DC | PRN

## 2024-01-31 MED ORDER — VITAMIN C 500 MG PO TABS
500.0000 mg | ORAL_TABLET | Freq: Two times a day (BID) | ORAL | Status: DC
  Administered 2024-02-01 – 2024-02-03 (×5): 500 mg via ORAL
  Filled 2024-01-31 (×6): qty 1

## 2024-01-31 NOTE — Hospital Course (Signed)
 73 y.o. female with medical history significant for COPD on home oxygen  2 L via nasal cannula, ESRD on hemodialysis, anemia, CHF, hep C, HTN, HLD who was brought into the ED on 01/29/24 for concern for shortness of breath, cough and confusion.  Patient is currently intubated and sedated and unable to contribute to history, therefore history is obtained from chart review.    It is reported that EMS found her hypoxic with O2 saturations of 82% on room air, confused and lethargic.  Reportedly the patient endorsed a productive cough for a few days along with audible wheezing.   ED Course: Initial Vital Signs: Temperature 98.6 F orally, RR 21, pulse 83, BP 176/91, SpO2 80% on room air Significant Labs: Potassium 5.2, glucose 114, BUN 69, Creatinine 9.3, Pro-BNP >35000, lactic acid 0.9, procalcitonin 0.48, WBC 10.2 Imaging Chest X-ray>>IMPRESSION: 1. Increased interstitial markings and trace bilateral pleural effusions concerning for mild congestive heart failure. . CT Head wo contrast>>IMPRESSION: 1. No acute findings. 2. Mild chronic ischemic microvascular disease. Medications Administered: 80 mg IV Lasix , Levaquin , duonebs, 500 cc NS bolus    TRH asked to admit for further workup and treatment.  Found to be Hypercapnic and was placed on BiPAP, but ultimately with poor tolerance of BiPAP with worsening hypoxia, required intubation in the ED.  PCCM consulted.   12/17: Presented to ED due to shortness of breath and AMS.  Placed on BiPAP, TRH asked to admit with plans for urgent HD.  Remained hypoxic and altered despite BiPAP requiring intubation.  PCCM consulted.  12/18: Received HD last night, Viral panel positive for Rhinovirus.  On minimal vent support, plan for WUA & SBT as tolerated utilizing Precedex .  Patient extubated.   12/19.  Patient transferred to medical service.  Stable for transfer out of the ICU. 12/20.  Patient still with shortness of breath and wheezing.  VBG showing a pCO2 of 70.   Will try to get noninvasive ventilation approved at night.  BiPAP ordered at night. 12/22.  Patient had dialysis today will be switching to Monday Wednesday Friday dialysis schedule for the holiday.  Hemoglobin 9.5.  Stable for discharge.

## 2024-01-31 NOTE — Assessment & Plan Note (Signed)
 Improved.  Likely secondary to severe acidosis and CO2 retention on admission.

## 2024-01-31 NOTE — Progress Notes (Signed)
 [name] and O x 4. VSS. PT tolerating diet well. No complaints of pain or nausea. Pt transferred via bed  Anna Key

## 2024-01-31 NOTE — Assessment & Plan Note (Signed)
 Unclear if viral or bacterial.  Empirically on Rocephin  and Zithromax .  Likely secondary to rhinovirus.

## 2024-01-31 NOTE — Assessment & Plan Note (Signed)
 Restarted low-dose losartan  and Toprol 

## 2024-01-31 NOTE — Progress Notes (Signed)
 Mobility Specialist - Progress Note   01/31/24 1443  Mobility  Activity Stood at bedside  Level of Assistance Contact guard assist, steadying assist  Assistive Device None  Activity Response Tolerated well  Mobility visit 1 Mobility  Mobility Specialist Start Time (ACUTE ONLY) 1410  Mobility Specialist Stop Time (ACUTE ONLY) 1421  Mobility Specialist Time Calculation (min) (ACUTE ONLY) 11 min   Pt supine upon entry, utilizing RA. Pt completed bed mob indep, requiring CGA to stand at bedside-- holding onto furniture for stability. One LOB noted upon standing, CGA for correction. Pt returned supine, left with alarm set and needs within reach.  America Silvan Mobility Specialist 01/31/2024 2:45 PM

## 2024-01-31 NOTE — Progress Notes (Signed)
 " Progress Note   Patient: Anna Key FMW:[id number] DOB: [birth year] DOA: 01/29/2024     2 DOS: the patient was seen and examined on 01/31/2024   Brief hospital course: 73 y.o. female with medical history significant for COPD on home oxygen  2 L via nasal cannula, ESRD on hemodialysis, anemia, CHF, hep C, HTN, HLD who was brought into the ED on 01/29/24 for concern for shortness of breath, cough and confusion.  Patient is currently intubated and sedated and unable to contribute to history, therefore history is obtained from chart review.    It is reported that EMS found her hypoxic with O2 saturations of 82% on room air, confused and lethargic.  Reportedly the patient endorsed a productive cough for a few days along with audible wheezing.   ED Course: Initial Vital Signs: Temperature 98.6 F orally, RR 21, pulse 83, BP 176/91, SpO2 80% on room air Significant Labs: Potassium 5.2, glucose 114, BUN 69, Creatinine 9.3, Pro-BNP >35000, lactic acid 0.9, procalcitonin 0.48, WBC 10.2 Imaging Chest X-ray>>IMPRESSION: 1. Increased interstitial markings and trace bilateral pleural effusions concerning for mild congestive heart failure. . CT Head wo contrast>>IMPRESSION: 1. No acute findings. 2. Mild chronic ischemic microvascular disease. Medications Administered: 80 mg IV Lasix , Levaquin , duonebs, 500 cc NS bolus    TRH asked to admit for further workup and treatment.  Found to be Hypercapnic and was placed on BiPAP, but ultimately with poor tolerance of BiPAP with worsening hypoxia, required intubation in the ED.  PCCM consulted.   12/17: Presented to ED due to shortness of breath and AMS.  Placed on BiPAP, TRH asked to admit with plans for urgent HD.  Remained hypoxic and altered despite BiPAP requiring intubation.  PCCM consulted.  12/18: Received HD last night, Viral panel positive for Rhinovirus.  On minimal vent support, plan for WUA & SBT as tolerated utilizing Precedex .  Patient  extubated.   12/19.  Patient transferred to medical service.  Stable for transfer out of the ICU.  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  Patient had severe acidosis on presentation.  Extubated on 12/18.  Currently back on 2 L of oxygen  which is her chronic requirement.  Will check VBG tomorrow morning to see if still retaining.  Multifocal pneumonia Unclear if viral or bacterial.  Empirically on Rocephin  and Zithromax .  Likely secondary to rhinovirus.  COPD with acute exacerbation (HCC)  continue Solu-Medrol  and nebulizer treatments.  Acute diastolic CHF (congestive heart failure) (HCC) Dialysis to manage fluid  Acute metabolic encephalopathy Improved.  Likely secondary to severe acidosis and CO2 retention on admission.  Pressure injury of skin Wound 01/29/24 1545 Pressure Injury Sacrum Mid;Lower Stage 2 -  Partial thickness loss of dermis presenting as a shallow open injury with a red, pink wound bed without slough. (Active)   Present on admission  End stage renal disease on dialysis [facility]) Dialysis as per nephrology  Hyperkalemia Dialysis helped manage  Anemia in chronic kidney disease Hemoglobin 9.9  Essential hypertension Meds on hold        Subjective: Patient admitted secondary to family on BiPAP trial and was intubated in the emergency room.  Admitted by ICU team.  Treatment for pneumonia, COPD exacerbation rhinovirus infection.  Physical Exam: Vitals:   01/31/24 0900 01/31/24 1000 01/31/24 1100 01/31/24 1200  BP: 137/72 138/76 121/71 123/68  Pulse: 88 92  90  Resp: (!) 24 (!) 25 (!)  21 19  Temp:    98.9 F (37.2 C)  TempSrc:    Oral  SpO2: 91% 92%    Weight:      Height:       Physical Exam HENT:     Head: Normocephalic.     Mouth/Throat:     Pharynx: No oropharyngeal exudate.  Eyes:     General: Lids are normal.     Conjunctiva/sclera:  Conjunctivae normal.  Cardiovascular:     Rate and Rhythm: Normal rate and regular rhythm.     Heart sounds: Normal heart sounds, S1 normal and S2 normal.  Pulmonary:     Breath sounds: Examination of the right-middle field reveals decreased breath sounds and wheezing. Examination of the left-middle field reveals decreased breath sounds and wheezing. Examination of the right-lower field reveals decreased breath sounds and rhonchi. Examination of the left-lower field reveals decreased breath sounds and rhonchi. Decreased breath sounds, wheezing and rhonchi present. No rales.  Abdominal:     Palpations: Abdomen is soft.     Tenderness: There is no abdominal tenderness.  Musculoskeletal:     Right lower leg: No swelling.     Left lower leg: No swelling.  Skin:    General: Skin is warm.     Findings: No rash.  Neurological:     Mental Status: She is alert and oriented to person, place, and time.     Data Reviewed: Creatinine 7.21, hemoglobin 9.9, white blood count 10.6, platelet count 140  Family Communication: Updated Sadie on the phone  Disposition: Status is: Inpatient Remains inpatient appropriate because: Still with some bronchospasm.  Continue IV steroids and antibiotics for now.  Dialysis tomorrow.  Planned Discharge Destination: Home    Time spent: 28 minutes  Author: Charlie Patterson, MD 01/31/2024 1:30 PM  For on call review [email or web address].  "

## 2024-01-31 NOTE — Assessment & Plan Note (Signed)
 Hemoglobin 9.9

## 2024-01-31 NOTE — Plan of Care (Signed)
" °  Problem: Activity: Goal: Ability to tolerate increased activity will improve Outcome: Progressing   Problem: Respiratory: Goal: Ability to maintain a clear airway and adequate ventilation will improve Outcome: Progressing   Problem: Role Relationship: Goal: Method of communication will improve Outcome: Progressing   Problem: Activity: Goal: Ability to tolerate increased activity will improve Outcome: Progressing   Problem: Clinical Measurements: Goal: Ability to maintain a body temperature in the normal range will improve Outcome: Progressing   Problem: Respiratory: Goal: Ability to maintain adequate ventilation will improve Outcome: Progressing   "

## 2024-01-31 NOTE — Assessment & Plan Note (Signed)
 Echocardiogram with an EF of 60%, moderately elevated pulmonary artery systolic pressure, moderate aortic valve regurgitation and moderate aortic valve stenosis.  Dialysis to manage fluid.

## 2024-01-31 NOTE — Assessment & Plan Note (Signed)
 Dialysis done today

## 2024-01-31 NOTE — Progress Notes (Signed)
 " [facility]  ROUNDING NOTE   Subjective:   Patient well-known to us  as we follow her for outpatient hemodialysis treatment at [facility] on TTS schedule.  Had significant lethargy upon admission and found to have RSV infection.  Subsequently developed acute respiratory failure.  Update: Patient now extubated.  Currently awake and alert. Due for dialysis treatment again tomorrow.  Objective:  Vital signs in last 24 hours:  Temp:  [98.5 F (36.9 C)-100.4 F (38 C)] 98.5 F (36.9 C) (12/19 1605) Pulse Rate:  [84-97] 86 (12/19 1605) Resp:  [15-25] 20 (12/19 1605) BP: (120-149)/(58-98) 136/67 (12/19 1605) SpO2:  [88 %-99 %] 91 % (12/19 1605) FiO2 (%):  [36 %] 36 % (12/18 2054)  Weight change:  Filed Weights   01/29/24 0707 01/29/24 1600  Weight: 72.2 kg 69.3 kg    Intake/Output: I/O last 3 completed shifts: In: 773.2 [I.V.:423.2; IV Piggyback:350] Out: 635 [Urine:35; Other:600]   Intake/Output this shift:  Total I/O In: 587 [P.O.:237; IV Piggyback:350] Out: -   Physical Exam: General: No acute distress  Head: Normocephalic, atraumatic.  Hearing intact  Neck: Supple  Lungs:  Clear to auscultation, normal effort  Heart: S1S2 no rubs  Abdomen:  Soft, nontender, bowel sounds present  Extremities: Trace peripheral edema.  Neurologic: Awake, alert, conversant  Skin: No acute rash  Access: Left upper extremity AV access    Basic Metabolic Panel: Recent Labs  Lab 01/29/24 0711 01/29/24 1150 01/30/24 0339 01/31/24 0334  NA 141  --  138 136  K 5.2*  --  3.4* 4.3  CL 103  --  96* 95*  CO2 24  --  26 26  GLUCOSE 114*  --  88 119*  BUN 69*  --  33* 48*  CREATININE 9.37* 9.51* 5.38* 7.21*  CALCIUM  8.2*  --  7.5* 7.3*  MG  --   --  1.9 2.2  PHOS  --   --  4.0 5.0*    Liver Function Tests: Recent Labs  Lab 01/29/24 0711 01/30/24 0339 01/31/24 0334  AST 31 22  --   ALT 25 17  --   ALKPHOS 99 71  --   BILITOT 0.4 0.4  --   PROT 7.6  5.9*  --   ALBUMIN 4.1 3.2* 3.5   No results for input(s): LIPASE, AMYLASE in the last 168 hours. Recent Labs  Lab 01/29/24 1150  AMMONIA 50*    CBC: Recent Labs  Lab 01/29/24 0711 01/29/24 1150 01/30/24 0339 01/31/24 0334  WBC 10.2 10.4 7.4 10.6*  NEUTROABS 8.9*  --   --   --   HGB 10.8* 10.1* 9.3* 9.9*  HCT 36.3 35.5* 30.4* 31.6*  MCV 100.6* 105.3* 98.7 97.5  PLT 170 164 122* 140*    Cardiac Enzymes: No results for input(s): CKTOTAL, CKMB, CKMBINDEX, TROPONINI in the last 168 hours.  BNP: Invalid input(s): POCBNP  CBG: Recent Labs  Lab 01/30/24 0008 01/30/24 0747 01/30/24 1128 01/30/24 1948 01/30/24 2312  GLUCAP 82 93 95 148* 110*    Microbiology: Results for orders placed or performed during the hospital encounter of 01/29/24  Blood Culture (routine x 2)     Status: None (Preliminary result)   Collection Time: 01/29/24  7:11 AM   Specimen: BLOOD  Result Value Ref Range Status   Specimen Description BLOOD BLOOD LEFT HAND  Final   Special Requests   Final    BOTTLES DRAWN AEROBIC AND ANAEROBIC Blood Culture adequate volume  Culture   Final    NO GROWTH 2 DAYS Performed at [facility], [address] [place], [place] [address]    Report Status PENDING  Incomplete  Blood Culture (routine x 2)     Status: None (Preliminary result)   Collection Time: 01/29/24  7:12 AM   Specimen: BLOOD  Result Value Ref Range Status   Specimen Description BLOOD RIGHT ANTECUBITAL  Final   Special Requests   Final    BOTTLES DRAWN AEROBIC AND ANAEROBIC Blood Culture adequate volume   Culture   Final    NO GROWTH 2 DAYS Performed at [facility], [address], [place], [place] [address]    Report Status PENDING  Incomplete  Resp panel by RT-PCR (RSV, Flu A&B, Covid) Anterior Nasal Swab     Status: None   Collection Time: 01/29/24  8:35 AM   Specimen: Anterior Nasal Swab  Result Value Ref Range Status   SARS Coronavirus 2 by RT  PCR NEGATIVE NEGATIVE Final    Comment: (NOTE) SARS-CoV-2 target nucleic acids are NOT DETECTED.  The SARS-CoV-2 RNA is generally detectable in upper respiratory specimens during the acute phase of infection. The lowest concentration of SARS-CoV-2 viral copies this assay can detect is 138 copies/mL. A negative result does not preclude SARS-Cov-2 infection and should not be used as the sole basis for treatment or other patient management decisions. A negative result may occur with  improper specimen collection/handling, submission of specimen other than nasopharyngeal swab, presence of viral mutation(s) within the areas targeted by this assay, and inadequate number of viral copies(<138 copies/mL). A negative result must be combined with clinical observations, patient history, and epidemiological information. The expected result is Negative.  Fact Sheet for Patients:  bloggercourse.com  Fact Sheet for Healthcare Providers:  seriousbroker.it  This test is no t yet approved or cleared by the [place]  FDA and  has been authorized for detection and/or diagnosis of SARS-CoV-2 by FDA under an Emergency Use Authorization (EUA). This EUA will remain  in effect (meaning this test can be used) for the duration of the COVID-19 declaration under Section 564(b)(1) of the Act, 21 U.S.C.section 360bbb-3(b)(1), unless the authorization is terminated  or revoked sooner.       Influenza A by PCR NEGATIVE NEGATIVE Final   Influenza B by PCR NEGATIVE NEGATIVE Final    Comment: (NOTE) The Xpert Xpress SARS-CoV-2/FLU/RSV plus assay is intended as an aid in the diagnosis of influenza from Nasopharyngeal swab specimens and should not be used as a sole basis for treatment. Nasal washings and aspirates are unacceptable for Xpert Xpress SARS-CoV-2/FLU/RSV testing.  Fact Sheet for Patients: bloggercourse.com  Fact Sheet for  Healthcare Providers: seriousbroker.it  This test is not yet approved or cleared by the [place]  FDA and has been authorized for detection and/or diagnosis of SARS-CoV-2 by FDA under an Emergency Use Authorization (EUA). This EUA will remain in effect (meaning this test can be used) for the duration of the COVID-19 declaration under Section 564(b)(1) of the Act, 21 U.S.C. section 360bbb-3(b)(1), unless the authorization is terminated or revoked.     Resp Syncytial Virus by PCR NEGATIVE NEGATIVE Final    Comment: (NOTE) Fact Sheet for Patients: bloggercourse.com  Fact Sheet for Healthcare Providers: seriousbroker.it  This test is not yet approved or cleared by the [place]  FDA and has been authorized for detection and/or diagnosis of SARS-CoV-2 by FDA under an Emergency Use Authorization (EUA). This EUA will remain in effect (  meaning this test can be used) for the duration of the COVID-19 declaration under Section 564(b)(1) of the Act, 21 U.S.C. section 360bbb-3(b)(1), unless the authorization is terminated or revoked.  Performed at [facility], [address] [place], [place] [address]   MRSA Next Gen by PCR, Nasal     Status: None   Collection Time: 01/29/24  4:09 PM   Specimen: Urine, Catheterized; Nasal Swab  Result Value Ref Range Status   MRSA by PCR Next Gen NOT DETECTED NOT DETECTED Final    Comment: (NOTE) The GeneXpert MRSA Assay (FDA approved for NASAL specimens only), is one component of a comprehensive MRSA colonization surveillance program. It is not intended to diagnose MRSA infection nor to guide or monitor treatment for MRSA infections. Test performance is not FDA approved in patients less than 24 years old. Performed at [facility], [address] [place], [place] [address]   Respiratory (~20 pathogens) panel by PCR     Status: Abnormal    Collection Time: 01/29/24  4:34 PM   Specimen: Urine, Catheterized; Respiratory  Result Value Ref Range Status   Adenovirus NOT DETECTED NOT DETECTED Final   Coronavirus 229E NOT DETECTED NOT DETECTED Final    Comment: (NOTE) The Coronavirus on the Respiratory Panel, DOES NOT test for the novel  Coronavirus (2019 nCoV)    Coronavirus HKU1 NOT DETECTED NOT DETECTED Final   Coronavirus NL63 NOT DETECTED NOT DETECTED Final   Coronavirus OC43 NOT DETECTED NOT DETECTED Final   Metapneumovirus NOT DETECTED NOT DETECTED Final   Rhinovirus / Enterovirus DETECTED (A) NOT DETECTED Final   Influenza A NOT DETECTED NOT DETECTED Final   Influenza B NOT DETECTED NOT DETECTED Final   Parainfluenza Virus 1 NOT DETECTED NOT DETECTED Final   Parainfluenza Virus 2 NOT DETECTED NOT DETECTED Final   Parainfluenza Virus 3 NOT DETECTED NOT DETECTED Final   Parainfluenza Virus 4 NOT DETECTED NOT DETECTED Final   Respiratory Syncytial Virus NOT DETECTED NOT DETECTED Final   Bordetella pertussis NOT DETECTED NOT DETECTED Final   Bordetella Parapertussis NOT DETECTED NOT DETECTED Final   Chlamydophila pneumoniae NOT DETECTED NOT DETECTED Final   Mycoplasma pneumoniae NOT DETECTED NOT DETECTED Final    Comment: Performed at [facility], [address], [place], [place] [address]    Coagulation Studies: Recent Labs    01/29/24 0711 01/30/24 0339  LABPROT 14.4 14.9  INR 1.1 1.1    Urinalysis: Recent Labs    01/29/24 1633  COLORURINE YELLOW*  LABSPEC 1.014  PHURINE 5.0  GLUCOSEU NEGATIVE  HGBUR LARGE*  BILIRUBINUR NEGATIVE  KETONESUR NEGATIVE  PROTEINUR 100*  NITRITE NEGATIVE  LEUKOCYTESUR SMALL*      Imaging: ECHOCARDIOGRAM COMPLETE Result Date: 01/30/2024    ECHOCARDIOGRAM REPORT   Patient Name:   FILICIA SCOGIN Linsley Date of Exam: 01/30/2024 Medical Rec #:  [id number]          Height:       64.0 in Accession #:    [id number]         Weight:       152.8 lb Date of Birth:  1950-03-18           BSA:          1.745 m Patient Age:    73 years           BP:           133/70 mmHg Patient Gender: F  HR:           86 bpm. Exam Location:  [facility] Procedure: 2D Echo, Cardiac Doppler and Color Doppler (Both Spectral and Color            Flow Doppler were utilized during procedure). Indications:     CHF  History:         Patient has prior history of Echocardiogram examinations, most                  recent 09/20/2020. CHF, CAD; Risk Factors:Hypertension and                  Dyslipidemia. CKD.  Sonographer:     Philomena Daring Referring Phys:  [id number] XZDYJA PAUDEL Diagnosing Phys: Evalene Lunger MD IMPRESSIONS  1. Left ventricular ejection fraction, by estimation, is 60 to 65%. The left ventricle has normal function. The left ventricle has no regional wall motion abnormalities. There is mild left ventricular hypertrophy. Left ventricular diastolic parameters are consistent with Grade I diastolic dysfunction (impaired relaxation).  2. Right ventricular systolic function is normal. The right ventricular size is mildly enlarged. There is moderately elevated pulmonary artery systolic pressure. The estimated right ventricular systolic pressure is 53.7 mmHg.  3. The mitral valve is normal in structure. No evidence of mitral valve regurgitation. No evidence of mitral stenosis.  4. Tricuspid valve regurgitation is mild to moderate.  5. The aortic valve is calcified. Aortic valve regurgitation is moderate. Moderate aortic valve stenosis. Aortic valve mean gradient measures 19.8 mmHg. Aortic valve Vmax measures 3.00 m/s.  6. The inferior vena cava is dilated in size with <50% respiratory variability, suggesting right atrial pressure of 15 mmHg. FINDINGS  Left Ventricle: Left ventricular ejection fraction, by estimation, is 60 to 65%. The left ventricle has normal function. The left ventricle has no regional wall motion abnormalities. Strain was performed and the global longitudinal strain is indeterminate. The  left ventricular internal cavity size was normal in size. There is mild left ventricular hypertrophy. Left ventricular diastolic parameters are consistent with Grade I diastolic dysfunction (impaired relaxation). Right Ventricle: The right ventricular size is mildly enlarged. No increase in right ventricular wall thickness. Right ventricular systolic function is normal. There is moderately elevated pulmonary artery systolic pressure. The tricuspid regurgitant velocity is 3.11 m/s, and with an assumed right atrial pressure of 15 mmHg, the estimated right ventricular systolic pressure is 53.7 mmHg. Left Atrium: Left atrial size was normal in size. Right Atrium: Right atrial size was normal in size. Pericardium: There is no evidence of pericardial effusion. Mitral Valve: The mitral valve is normal in structure. No evidence of mitral valve regurgitation. No evidence of mitral valve stenosis. Tricuspid Valve: The tricuspid valve is normal in structure. Tricuspid valve regurgitation is mild to moderate. No evidence of tricuspid stenosis. Aortic Valve: The aortic valve is calcified. Aortic valve regurgitation is moderate. Aortic regurgitation PHT measures 556 msec. Moderate aortic stenosis is present. Aortic valve mean gradient measures 19.8 mmHg. Aortic valve peak gradient measures 35.9 mmHg. Aortic valve area, by VTI measures 2.21 cm. Pulmonic Valve: The pulmonic valve was normal in structure. Pulmonic valve regurgitation is not visualized. No evidence of pulmonic stenosis. Aorta: The aortic root is normal in size and structure. Venous: The inferior vena cava is dilated in size with less than 50% respiratory variability, suggesting right atrial pressure of 15 mmHg. IAS/Shunts: No atrial level shunt detected by color flow Doppler. Additional Comments: 3D was performed not requiring image post processing  on an independent workstation and was indeterminate.  LEFT VENTRICLE PLAX 2D LVIDd:         4.30 cm   Diastology LVIDs:          2.60 cm   LV e' medial:    3.81 cm/s LV PW:         1.10 cm   LV E/e' medial:  27.3 LV IVS:        1.40 cm   LV e' lateral:   5.22 cm/s LVOT diam:     1.90 cm   LV E/e' lateral: 19.9 LV SV:         109 LV SV Index:   63 LVOT Area:     2.84 cm  RIGHT VENTRICLE             IVC RV Basal diam:  3.70 cm     IVC diam: 2.50 cm RV S prime:     16.30 cm/s TAPSE (M-mode): 2.7 cm LEFT ATRIUM             Index        RIGHT ATRIUM           Index LA diam:        3.10 cm 1.78 cm/m   RA Area:     25.00 cm LA Vol (A2C):   49.5 ml 28.37 ml/m  RA Volume:   83.60 ml  47.91 ml/m LA Vol (A4C):   51.4 ml 29.46 ml/m LA Biplane Vol: 55.4 ml 31.75 ml/m  AORTIC VALVE AV Area (Vmax):    2.04 cm AV Area (Vmean):   2.06 cm AV Area (VTI):     2.21 cm AV Vmax:           299.50 cm/s AV Vmean:          203.500 cm/s AV VTI:            0.494 m AV Peak Grad:      35.9 mmHg AV Mean Grad:      19.8 mmHg LVOT Vmax:         216.00 cm/s LVOT Vmean:        148.000 cm/s LVOT VTI:          0.386 m LVOT/AV VTI ratio: 0.78 AI PHT:            556 msec  AORTA Ao Root diam: 3.00 cm MITRAL VALVE                TRICUSPID VALVE MV Area (PHT): 3.23 cm     TR Peak grad:   38.7 mmHg MV Decel Time: 235 msec     TR Vmax:        311.00 cm/s MV E velocity: 104.00 cm/s MV A velocity: 138.00 cm/s  SHUNTS MV E/A ratio:  0.75         Systemic VTI:  0.39 m                             Systemic Diam: 1.90 cm Evalene Lunger MD Electronically signed by Evalene Lunger MD Signature Date/Time: 01/30/2024/2:15:57 PM    Final    DG Chest Port 1 View Result Date: 01/30/2024 CLINICAL DATA:  Acute respiratory failure with hypoxia hypercarbia EXAM: PORTABLE CHEST 1 VIEW COMPARISON:  Yesterday FINDINGS: Stable cardiomegaly. Endotracheal and nasogastric tubes are in grossly good position. Stable mild bibasilar opacities are noted. Bony thorax is unremarkable. IMPRESSION: Stable support  apparatus. Stable bibasilar opacities. Electronically Signed   By: Lynwood Landy Raddle  M.D.   On: 01/30/2024 09:02   DG Abd 1 View Result Date: 01/29/2024 CLINICAL DATA:  Enteric tube placement. EXAM: ABDOMEN - 1 VIEW COMPARISON:  None Available. FINDINGS: Enteric tube with tip and side-port in the left upper abdomen in the proximal stomach. IMPRESSION: Enteric tube with tip in the proximal stomach. Electronically Signed   By: Vanetta Chou M.D.   On: 01/29/2024 19:47     Medications:    azithromycin  Stopped (01/31/24 1127)   cefTRIAXone  (ROCEPHIN )  IV Stopped (01/31/24 0958)    [START ON 02/01/2024] allopurinol   100 mg Oral Daily   vitamin C   500 mg Oral BID   aspirin  EC  325 mg Oral Daily   Chlorhexidine  Gluconate Cloth  6 each Topical Q0600   feeding supplement (NEPRO CARB STEADY)  237 mL Oral TID BM   heparin   5,000 Units Subcutaneous Q8H   ipratropium-albuterol   3 mL Nebulization TID   methylPREDNISolone  (SOLU-MEDROL ) injection  40 mg Intravenous Daily   multivitamin  1 tablet Oral QHS   acetaminophen  **OR** acetaminophen , ondansetron  **OR** ondansetron  (ZOFRAN ) IV, mouth rinse, polyethylene glycol  Assessment/ Plan:  73 y.o. female with past medical history of ESRD on HD TTS, COPD on home oxygen  2 L, CHF, hepatitis C, hypertension, upper lipidemia, anemia chronic kidney disease, secondary hyperparathyroidism who came in with lethargy and ultimately found to have acute respiratory failure.  1.  ESRD on HD TTS.  Patient completed dialysis treatment early yesterday a.m.  We will plan for hemodialysis treatment again tomorrow.  2.  Acute respiratory failure.  Required ventilatory support shortly after admission.  Extubated 01/30/2024.  Now breathing comfortably.  3.  Anemia chronic kidney disease.   Lab Results  Component Value Date   HGB 9.9 (L) 01/31/2024  Hemoglobin up to 9.9.  Continue to monitor CBC for now.   4.  Secondary hyperparathyroidism.  Continue to monitor bone metabolism parameters over the course of the hospitalization.   LOS: 2 Amya Hlad 12/19/20254:25 PM   "

## 2024-01-31 NOTE — Plan of Care (Signed)
  Problem: Activity: Goal: Ability to tolerate increased activity will improve Outcome: Progressing   Problem: Clinical Measurements: Goal: Ability to maintain a body temperature in the normal range will improve Outcome: Progressing   Problem: Respiratory: Goal: Ability to maintain adequate ventilation will improve Outcome: Progressing Goal: Ability to maintain a clear airway will improve Outcome: Progressing   

## 2024-01-31 NOTE — Assessment & Plan Note (Signed)
 Wound 01/29/24 1545 Pressure Injury Sacrum Mid;Lower Stage 2 -  Partial thickness loss of dermis presenting as a shallow open injury with a red, pink wound bed without slough. (Active)   Present on admission

## 2024-01-31 NOTE — Assessment & Plan Note (Signed)
 Continue Solu-Medrol  and nebulizer treatments.  Patient will benefit from noninvasive ventilation at night with elevation of pCO2 to avoid hospital readmissions and improve quality of life.

## 2024-01-31 NOTE — Assessment & Plan Note (Signed)
 Dialysis helped manage

## 2024-01-31 NOTE — Progress Notes (Signed)
 Nutrition Follow Up Note   DOCUMENTATION CODES:   Not applicable  INTERVENTION:   Nepro Shake po TID, each supplement provides 425 kcal and 19 grams protein  Rena-vit po daily   Vitamin C  500mg  po BID   Pt remains at high refeed risk; recommend monitor potassium, magnesium  and phosphorus labs daily until stable  Daily weights   NUTRITION DIAGNOSIS:   Inadequate oral intake related to inability to eat (pt sedated and ventilated) as evidenced by NPO status. -progressing   GOAL:   Patient will meet greater than or equal to 90% of their needs -not met   MONITOR:   PO intake, Supplement acceptance, Labs, Weight trends, Skin, I & O's  ASSESSMENT:   73 y/o female with h/o HTN, HLD, CHF, ESRD on HD, hyperparathyroidism, s/p laparoscopic lysis of adhesions (2021), CAD, IDA, HCV and gout who is admitted with CHF with volume overlaod and bilateral pleural effusions, PNA, UTI and rhinovirus.  Pt extubated 12/18. Pt initiated on a renal diet today. RD will add supplements and vitamins to help pt meet her estimated needs. Pt remains at high refeed risk. No new weight since admit. Pt +1.2L on her I & Os. Plan is for HD tomorrow.   Medications reviewed and include: aspirin , heparin , solu-medrol , azithromycin , ceftriaxone   Labs reviewed: K 4.3 wnl, BUN 48(H), creat 7.21(H), P 5.0(H), Mg 2.2wnl Wbc- 10.6(H), Hgb 9.9(L), Hct 31.6(L) Cbgs- 110, 148, 95, 93, 82 x 24 hrs   UOP- 15ml   Diet Order:   Diet Order             Diet renal with fluid restriction Fluid restriction: 1200 mL Fluid; Room service appropriate? Yes; Fluid consistency: Thin  Diet effective now                  EDUCATION NEEDS:   No education needs have been identified at this time  Skin:  Skin Assessment: Reviewed RN Assessment (Stage II sacrum)  Last BM:  12/19- type 5  Height:   Ht Readings from Last 1 Encounters:  01/29/24 5' 4 (1.626 m)    Weight:   Wt Readings from Last 1 Encounters:   01/29/24 69.3 kg    Ideal Body Weight:  54.5 kg  BMI:  Body mass index is 26.22 kg/m.  Estimated Nutritional Needs:   Kcal:  1700-1900kcal/day  Protein:  85-95g/day  Fluid:  UOP +1L  Augustin Shams MS, RD, LDN If unable to be reached, please send secure chat to RD inpatient available from 8:00a-4:00p daily

## 2024-01-31 NOTE — Assessment & Plan Note (Addendum)
 Patient intubated in ER after failing BiPAP trial.  Patient had severe acidosis on presentation.  Extubated on 12/18.  Currently back on 3 L of oxygen  which slightly higher than her chronic requirement.  VBG this morning shows she is still retaining with a pCO2 of 70.  Will do a BiPAP trial.  Consulted transitional care team to try to get noninvasive ventilation approved.

## 2024-02-01 DIAGNOSIS — E875 Hyperkalemia: Secondary | ICD-10-CM | POA: Diagnosis not present

## 2024-02-01 DIAGNOSIS — J441 Chronic obstructive pulmonary disease with (acute) exacerbation: Secondary | ICD-10-CM | POA: Diagnosis not present

## 2024-02-01 DIAGNOSIS — Z992 Dependence on renal dialysis: Secondary | ICD-10-CM | POA: Diagnosis not present

## 2024-02-01 DIAGNOSIS — I5031 Acute diastolic (congestive) heart failure: Secondary | ICD-10-CM | POA: Diagnosis not present

## 2024-02-01 DIAGNOSIS — J188 Other pneumonia, unspecified organism: Secondary | ICD-10-CM | POA: Diagnosis not present

## 2024-02-01 DIAGNOSIS — E785 Hyperlipidemia, unspecified: Secondary | ICD-10-CM | POA: Diagnosis not present

## 2024-02-01 DIAGNOSIS — D631 Anemia in chronic kidney disease: Secondary | ICD-10-CM | POA: Diagnosis not present

## 2024-02-01 DIAGNOSIS — J9612 Chronic respiratory failure with hypercapnia: Secondary | ICD-10-CM | POA: Diagnosis not present

## 2024-02-01 DIAGNOSIS — J9601 Acute respiratory failure with hypoxia: Secondary | ICD-10-CM | POA: Diagnosis not present

## 2024-02-01 DIAGNOSIS — G9341 Metabolic encephalopathy: Secondary | ICD-10-CM | POA: Diagnosis not present

## 2024-02-01 DIAGNOSIS — N186 End stage renal disease: Secondary | ICD-10-CM | POA: Diagnosis not present

## 2024-02-01 LAB — BLOOD GAS, VENOUS
Acid-Base Excess: 3.1 mmol/L — ABNORMAL HIGH (ref 0.0–2.0)
Bicarbonate: 32.1 mmol/L — ABNORMAL HIGH (ref 20.0–28.0)
O2 Saturation: 72.5 %
Patient temperature: 37
pCO2, Ven: 70 mmHg — ABNORMAL HIGH (ref 44–60)
pH, Ven: 7.27 (ref 7.25–7.43)
pO2, Ven: 43 mmHg (ref 32–45)

## 2024-02-01 LAB — CBC
HCT: 34 % — ABNORMAL LOW (ref 36.0–46.0)
Hemoglobin: 10.6 g/dL — ABNORMAL LOW (ref 12.0–15.0)
MCH: 30.7 pg (ref 26.0–34.0)
MCHC: 31.2 g/dL (ref 30.0–36.0)
MCV: 98.6 fL (ref 80.0–100.0)
Platelets: 141 K/uL — ABNORMAL LOW (ref 150–400)
RBC: 3.45 MIL/uL — ABNORMAL LOW (ref 3.87–5.11)
RDW: 16.5 % — ABNORMAL HIGH (ref 11.5–15.5)
WBC: 11.6 K/uL — ABNORMAL HIGH (ref 4.0–10.5)
nRBC: 0.3 % — ABNORMAL HIGH (ref 0.0–0.2)

## 2024-02-01 LAB — BASIC METABOLIC PANEL WITH GFR
Anion gap: 10 (ref 5–15)
BUN: 66 mg/dL — ABNORMAL HIGH (ref 8–23)
CO2: 32 mmol/L (ref 22–32)
Calcium: 7.3 mg/dL — ABNORMAL LOW (ref 8.9–10.3)
Chloride: 96 mmol/L — ABNORMAL LOW (ref 98–111)
Creatinine, Ser: 8.68 mg/dL — ABNORMAL HIGH (ref 0.44–1.00)
GFR, Estimated: 4 mL/min — ABNORMAL LOW
Glucose, Bld: 105 mg/dL — ABNORMAL HIGH (ref 70–99)
Potassium: 4.1 mmol/L (ref 3.5–5.1)
Sodium: 138 mmol/L (ref 135–145)

## 2024-02-01 LAB — PHOSPHORUS: Phosphorus: 6.1 mg/dL — ABNORMAL HIGH (ref 2.5–4.6)

## 2024-02-01 LAB — MAGNESIUM: Magnesium: 2.5 mg/dL — ABNORMAL HIGH (ref 1.7–2.4)

## 2024-02-01 MED ORDER — LOSARTAN POTASSIUM 25 MG PO TABS
12.5000 mg | ORAL_TABLET | Freq: Every day | ORAL | Status: DC
  Administered 2024-02-01 – 2024-02-02 (×2): 12.5 mg via ORAL
  Filled 2024-02-01 (×2): qty 1

## 2024-02-01 MED ORDER — LIDOCAINE-PRILOCAINE 2.5-2.5 % EX CREA: 1.0000 | TOPICAL_CREAM | CUTANEOUS | Status: DC | PRN

## 2024-02-01 MED ORDER — PENTAFLUOROPROP-TETRAFLUOROETH EX AERO: 1.0000 | INHALATION_SPRAY | CUTANEOUS | Status: DC | PRN

## 2024-02-01 MED ORDER — PENTAFLUOROPROP-TETRAFLUOROETH EX AERO
INHALATION_SPRAY | CUTANEOUS | Status: AC
  Filled 2024-02-01: qty 30

## 2024-02-01 MED ORDER — HEPARIN SODIUM (PORCINE) 1000 UNIT/ML DIALYSIS: 1000.0000 [IU] | INTRAMUSCULAR | Status: DC | PRN

## 2024-02-01 MED ORDER — METOPROLOL SUCCINATE ER 25 MG PO TB24
25.0000 mg | ORAL_TABLET | Freq: Every morning | ORAL | Status: DC
  Administered 2024-02-02 – 2024-02-03 (×2): 25 mg via ORAL
  Filled 2024-02-01 (×2): qty 1

## 2024-02-01 MED ORDER — ALTEPLASE 2 MG IJ SOLR: 2.0000 mg | Freq: Once | INTRAMUSCULAR | Status: DC | PRN

## 2024-02-01 MED ORDER — LIDOCAINE HCL (PF) 1 % IJ SOLN: 5.0000 mL | INTRAMUSCULAR | Status: DC | PRN

## 2024-02-01 NOTE — Progress Notes (Addendum)
 PT Cancellation Note  Patient Details Name: Anna Key MRN: [id number] DOB: August 18, 1950   Cancelled Treatment:    Reason Eval/Treat Not Completed: Patient at procedure or test/unavailable pt at [facility] A Yesenia Fontenette 02/01/2024, 11:30 AM

## 2024-02-01 NOTE — Plan of Care (Signed)
" °  Problem: Activity: Goal: Ability to tolerate increased activity will improve Outcome: Progressing   Problem: Respiratory: Goal: Ability to maintain a clear airway and adequate ventilation will improve Outcome: Progressing   Problem: Clinical Measurements: Goal: Ability to maintain clinical measurements within normal limits will improve Outcome: Progressing Goal: Will remain free from infection Outcome: Progressing Goal: Diagnostic test results will improve Outcome: Progressing Goal: Respiratory complications will improve Outcome: Progressing Goal: Cardiovascular complication will be avoided Outcome: Progressing   Problem: Activity: Goal: Risk for activity intolerance will decrease Outcome: Progressing   Problem: Safety: Goal: Ability to remain free from injury will improve Outcome: Progressing   "

## 2024-02-01 NOTE — Progress Notes (Signed)
 Hemodialysis Note:  Received patient in bed to unit. Alert and oriented. Informed consent singed and in chart.  Treatment initiated: 0800 Treatment completed: 1111  Access used: Left AVF Access issues: None  Patient tolerated well. Transported back to room, alert without acute distress. Report given to patient's RN.  Total UF removed: 2 Liters Medications given: None  Post HD weight: 64.3 Kg  [facility]

## 2024-02-01 NOTE — Progress Notes (Signed)
 " [facility]  PROGRESS NOTE   Subjective:   Patient seen on dialysis.  Tolerating fluid removal well.  Objective:  Vital signs: Blood pressure 136/66, pulse 90, temperature 99.5 F (37.5 C), temperature source Oral, resp. rate (!) 23, height 5' 4 (1.626 m), weight 64.3 kg, SpO2 94%.  Intake/Output Summary (Last 24 hours) at 02/01/2024 1156 Last data filed at 02/01/2024 1111 Gross per 24 hour  Intake 587 ml  Output 2000 ml  Net -1413 ml   Filed Weights   01/29/24 1600 02/01/24 0731 02/01/24 1111  Weight: 69.3 kg 66.3 kg 64.3 kg     Physical Exam: General:  No acute distress  Head:  Normocephalic, atraumatic. Moist oral mucosal membranes  Eyes:  Anicteric  Neck:  Supple  Lungs:   Clear to auscultation, normal effort  Heart:  S1S2 no rubs  Abdomen:   Soft, nontender, bowel sounds present  Extremities:  peripheral edema.  Neurologic:  Awake, alert, following commands  Skin:  No lesions  Access:     Basic Metabolic Panel: Recent Labs  Lab 01/29/24 0711 01/29/24 1150 01/30/24 0339 01/31/24 0334 02/01/24 0512  NA 141  --  138 136 138  K 5.2*  --  3.4* 4.3 4.1  CL 103  --  96* 95* 96*  CO2 24  --  26 26 32  GLUCOSE 114*  --  88 119* 105*  BUN 69*  --  33* 48* 66*  CREATININE 9.37* 9.51* 5.38* 7.21* 8.68*  CALCIUM  8.2*  --  7.5* 7.3* 7.3*  MG  --   --  1.9 2.2 2.5*  PHOS  --   --  4.0 5.0* 6.1*   GFR: Estimated Creatinine Clearance: 5.1 mL/min (A) (by C-G formula based on SCr of 8.68 mg/dL (H)).  Liver Function Tests: Recent Labs  Lab 01/29/24 0711 01/30/24 0339 01/31/24 0334  AST 31 22  --   ALT 25 17  --   ALKPHOS 99 71  --   BILITOT 0.4 0.4  --   PROT 7.6 5.9*  --   ALBUMIN 4.1 3.2* 3.5   No results for input(s): LIPASE, AMYLASE in the last 168 hours. Recent Labs  Lab 01/29/24 1150  AMMONIA 50*    CBC: Recent Labs  Lab 01/29/24 0711 01/29/24 1150 01/30/24 0339 01/31/24 0334 02/01/24 0512  WBC 10.2 10.4 7.4 10.6*  11.6*  NEUTROABS 8.9*  --   --   --   --   HGB 10.8* 10.1* 9.3* 9.9* 10.6*  HCT 36.3 35.5* 30.4* 31.6* 34.0*  MCV 100.6* 105.3* 98.7 97.5 98.6  PLT 170 164 122* 140* 141*     HbA1C: No results found for: HGBA1C  Urinalysis: Recent Labs    01/29/24 1633  COLORURINE YELLOW*  LABSPEC 1.014  PHURINE 5.0  GLUCOSEU NEGATIVE  HGBUR LARGE*  BILIRUBINUR NEGATIVE  KETONESUR NEGATIVE  PROTEINUR 100*  NITRITE NEGATIVE  LEUKOCYTESUR SMALL*      Imaging: ECHOCARDIOGRAM COMPLETE Result Date: 01/30/2024    ECHOCARDIOGRAM REPORT   Patient Name:   Anna Key Date of Exam: 01/30/2024 Medical Rec #:  [id number]          Height:       64.0 in Accession #:    [id number]         Weight:       152.8 lb Date of Birth:  10-07-1950          BSA:  1.745 m Patient Age:    73 years           BP:           133/70 mmHg Patient Gender: F                  HR:           86 bpm. Exam Location:  [facility] Procedure: 2D Echo, Cardiac Doppler and Color Doppler (Both Spectral and Color            Flow Doppler were utilized during procedure). Indications:     CHF  History:         Patient has prior history of Echocardiogram examinations, most                  recent 09/20/2020. CHF, CAD; Risk Factors:Hypertension and                  Dyslipidemia. CKD.  Sonographer:     Philomena Daring Referring Phys:  [id number] XZDYJA PAUDEL Diagnosing Phys: Evalene Lunger MD IMPRESSIONS  1. Left ventricular ejection fraction, by estimation, is 60 to 65%. The left ventricle has normal function. The left ventricle has no regional wall motion abnormalities. There is mild left ventricular hypertrophy. Left ventricular diastolic parameters are consistent with Grade I diastolic dysfunction (impaired relaxation).  2. Right ventricular systolic function is normal. The right ventricular size is mildly enlarged. There is moderately elevated pulmonary artery systolic pressure. The estimated right ventricular systolic pressure is 53.7 mmHg.  3.  The mitral valve is normal in structure. No evidence of mitral valve regurgitation. No evidence of mitral stenosis.  4. Tricuspid valve regurgitation is mild to moderate.  5. The aortic valve is calcified. Aortic valve regurgitation is moderate. Moderate aortic valve stenosis. Aortic valve mean gradient measures 19.8 mmHg. Aortic valve Vmax measures 3.00 m/s.  6. The inferior vena cava is dilated in size with <50% respiratory variability, suggesting right atrial pressure of 15 mmHg. FINDINGS  Left Ventricle: Left ventricular ejection fraction, by estimation, is 60 to 65%. The left ventricle has normal function. The left ventricle has no regional wall motion abnormalities. Strain was performed and the global longitudinal strain is indeterminate. The left ventricular internal cavity size was normal in size. There is mild left ventricular hypertrophy. Left ventricular diastolic parameters are consistent with Grade I diastolic dysfunction (impaired relaxation). Right Ventricle: The right ventricular size is mildly enlarged. No increase in right ventricular wall thickness. Right ventricular systolic function is normal. There is moderately elevated pulmonary artery systolic pressure. The tricuspid regurgitant velocity is 3.11 m/s, and with an assumed right atrial pressure of 15 mmHg, the estimated right ventricular systolic pressure is 53.7 mmHg. Left Atrium: Left atrial size was normal in size. Right Atrium: Right atrial size was normal in size. Pericardium: There is no evidence of pericardial effusion. Mitral Valve: The mitral valve is normal in structure. No evidence of mitral valve regurgitation. No evidence of mitral valve stenosis. Tricuspid Valve: The tricuspid valve is normal in structure. Tricuspid valve regurgitation is mild to moderate. No evidence of tricuspid stenosis. Aortic Valve: The aortic valve is calcified. Aortic valve regurgitation is moderate. Aortic regurgitation PHT measures 556 msec. Moderate  aortic stenosis is present. Aortic valve mean gradient measures 19.8 mmHg. Aortic valve peak gradient measures 35.9 mmHg. Aortic valve area, by VTI measures 2.21 cm. Pulmonic Valve: The pulmonic valve was normal in structure. Pulmonic valve regurgitation is not visualized. No evidence of pulmonic  stenosis. Aorta: The aortic root is normal in size and structure. Venous: The inferior vena cava is dilated in size with less than 50% respiratory variability, suggesting right atrial pressure of 15 mmHg. IAS/Shunts: No atrial level shunt detected by color flow Doppler. Additional Comments: 3D was performed not requiring image post processing on an independent workstation and was indeterminate.  LEFT VENTRICLE PLAX 2D LVIDd:         4.30 cm   Diastology LVIDs:         2.60 cm   LV e' medial:    3.81 cm/s LV PW:         1.10 cm   LV E/e' medial:  27.3 LV IVS:        1.40 cm   LV e' lateral:   5.22 cm/s LVOT diam:     1.90 cm   LV E/e' lateral: 19.9 LV SV:         109 LV SV Index:   63 LVOT Area:     2.84 cm  RIGHT VENTRICLE             IVC RV Basal diam:  3.70 cm     IVC diam: 2.50 cm RV S prime:     16.30 cm/s TAPSE (M-mode): 2.7 cm LEFT ATRIUM             Index        RIGHT ATRIUM           Index LA diam:        3.10 cm 1.78 cm/m   RA Area:     25.00 cm LA Vol (A2C):   49.5 ml 28.37 ml/m  RA Volume:   83.60 ml  47.91 ml/m LA Vol (A4C):   51.4 ml 29.46 ml/m LA Biplane Vol: 55.4 ml 31.75 ml/m  AORTIC VALVE AV Area (Vmax):    2.04 cm AV Area (Vmean):   2.06 cm AV Area (VTI):     2.21 cm AV Vmax:           299.50 cm/s AV Vmean:          203.500 cm/s AV VTI:            0.494 m AV Peak Grad:      35.9 mmHg AV Mean Grad:      19.8 mmHg LVOT Vmax:         216.00 cm/s LVOT Vmean:        148.000 cm/s LVOT VTI:          0.386 m LVOT/AV VTI ratio: 0.78 AI PHT:            556 msec  AORTA Ao Root diam: 3.00 cm MITRAL VALVE                TRICUSPID VALVE MV Area (PHT): 3.23 cm     TR Peak grad:   38.7 mmHg MV Decel Time: 235  msec     TR Vmax:        311.00 cm/s MV E velocity: 104.00 cm/s MV A velocity: 138.00 cm/s  SHUNTS MV E/A ratio:  0.75         Systemic VTI:  0.39 m                             Systemic Diam: 1.90 cm Evalene Lunger MD Electronically signed by Evalene Lunger MD Signature Date/Time: 01/30/2024/2:15:57 PM    Final  Medications:    azithromycin  Stopped (01/31/24 1127)   cefTRIAXone  (ROCEPHIN )  IV Stopped (01/31/24 0958)    allopurinol   100 mg Oral Daily   vitamin C   500 mg Oral BID   aspirin  EC  325 mg Oral Daily   Chlorhexidine  Gluconate Cloth  6 each Topical Q0600   feeding supplement (NEPRO CARB STEADY)  237 mL Oral TID BM   heparin   5,000 Units Subcutaneous Q8H   ipratropium-albuterol   3 mL Nebulization TID   methylPREDNISolone  (SOLU-MEDROL ) injection  40 mg Intravenous Daily   multivitamin  1 tablet Oral QHS    Assessment/ Plan:     73 year old female with history of COPD, hypertension, congestive heart failure, hepatitis C, hyperlipidemia, end-stage renal disease on dialysis with anemia secondary hyperparathyroidism now admitted with history of respiratory failure.  #1: ESRD: Patient has been on a TTS schedule for dialysis.  Will attempt to remove 2 L fluid removal today.  Patient is advised on the importance of fluid restriction.  #2: Respiratory failure: Patient initially was intubated now extubated and is comfortable on room air.  She was given antibiotics and also has been on Solu-Medrol .  #3: Anemia: Patient has anemia of chronic kidney disease.  Will continue anemia protocols.  #4: Secondary hyperparathyroidism: Will continue to monitor PTH, calcium  and phosphorus levels.  Labs and medications reviewed. Will continue to follow along with you.   LOS: 3 Dannelle Rhymes, MD [facility] 12/20/202511:56 AM  "

## 2024-02-01 NOTE — Progress Notes (Signed)
 " Progress Note   Patient: Anna Key FMW:[id number] DOB: 17-Oct-1950 DOA: 01/29/2024     3 DOS: the patient was seen and examined on 02/01/2024   Brief hospital course: 73 y.o. female with medical history significant for COPD on home oxygen  2 L via nasal cannula, ESRD on hemodialysis, anemia, CHF, hep C, HTN, HLD who was brought into the ED on 01/29/24 for concern for shortness of breath, cough and confusion.  Patient is currently intubated and sedated and unable to contribute to history, therefore history is obtained from chart review.    It is reported that EMS found her hypoxic with O2 saturations of 82% on room air, confused and lethargic.  Reportedly the patient endorsed a productive cough for a few days along with audible wheezing.   ED Course: Initial Vital Signs: Temperature 98.6 F orally, RR 21, pulse 83, BP 176/91, SpO2 80% on room air Significant Labs: Potassium 5.2, glucose 114, BUN 69, Creatinine 9.3, Pro-BNP >35000, lactic acid 0.9, procalcitonin 0.48, WBC 10.2 Imaging Chest X-ray>>IMPRESSION: 1. Increased interstitial markings and trace bilateral pleural effusions concerning for mild congestive heart failure. . CT Head wo contrast>>IMPRESSION: 1. No acute findings. 2. Mild chronic ischemic microvascular disease. Medications Administered: 80 mg IV Lasix , Levaquin , duonebs, 500 cc NS bolus    TRH asked to admit for further workup and treatment.  Found to be Hypercapnic and was placed on BiPAP, but ultimately with poor tolerance of BiPAP with worsening hypoxia, required intubation in the ED.  PCCM consulted.   12/17: Presented to ED due to shortness of breath and AMS.  Placed on BiPAP, TRH asked to admit with plans for urgent HD.  Remained hypoxic and altered despite BiPAP requiring intubation.  PCCM consulted.  12/18: Received HD last night, Viral panel positive for Rhinovirus.  On minimal vent support, plan for WUA & SBT as tolerated utilizing Precedex .  Patient  extubated.   12/19.  Patient transferred to medical service.  Stable for transfer out of the ICU. 12/20.  Patient still with shortness of breath and wheezing.  VBG showing a pCO2 of 70.  Will try to get noninvasive ventilation approved at night.  BiPAP ordered at night.  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  Patient had severe acidosis on presentation.  Extubated on 12/18.  Currently back on 2 L of oxygen  which is her chronic requirement.  VBG this morning shows she is still retaining with a pCO2 of 70.  Will do a BiPAP trial.  Consulted transitional care team to try to get noninvasive ventilation approved.  Multifocal pneumonia Unclear if viral or bacterial.  Empirically on Rocephin  and Zithromax .  Likely secondary to rhinovirus.  COPD with acute exacerbation (HCC) Continue Solu-Medrol  and nebulizer treatments.  Patient will benefit from noninvasive ventilation at night with elevation of pCO2 to avoid hospital readmissions and improve quality of life.  Acute diastolic CHF (congestive heart failure) (HCC) Echocardiogram with an EF of 60%, moderately elevated pulmonary artery systolic pressure, moderate aortic valve regurgitation and moderate aortic valve stenosis.  Dialysis to manage fluid  End stage renal disease on dialysis [facility]) Dialysis done today  Acute metabolic encephalopathy Improved.  Likely secondary to severe acidosis and CO2 retention on admission.  Pressure injury of skin Wound 01/29/24 1545 Pressure Injury Sacrum Mid;Lower Stage 2 -  Partial thickness loss of dermis presenting as a shallow open injury with a red, pink wound bed without  slough. (Active)   Present on admission  Hyperkalemia Dialysis helped manage  Anemia in chronic kidney disease Hemoglobin 9.9  Essential hypertension Restart low-dose losartan  and Toprol         Subjective: Patient seen while on  dialysis.  Still has coughing and wheezing.  Still little short of breath.  Initially admitted and was intubated secondary to acidosis and high pCO2.  Physical Exam: Vitals:   02/01/24 1030 02/01/24 1100 02/01/24 1111 02/01/24 1148  BP: 130/75 (!) 143/78 133/72 136/66  Pulse: 90 94 96 90  Resp: 18 (!) 22 (!) 23   Temp:   98.5 F (36.9 C) 99.5 F (37.5 C)  TempSrc:   Oral Oral  SpO2: 95% 93% 91% 94%  Weight:   64.3 kg   Height:       Physical Exam HENT:     Head: Normocephalic.     Mouth/Throat:     Pharynx: No oropharyngeal exudate.  Eyes:     General: Lids are normal.     Conjunctiva/sclera: Conjunctivae normal.  Cardiovascular:     Rate and Rhythm: Normal rate and regular rhythm.     Heart sounds: Normal heart sounds, S1 normal and S2 normal.  Pulmonary:     Breath sounds: Examination of the right-middle field reveals wheezing. Examination of the left-middle field reveals wheezing. Examination of the right-lower field reveals decreased breath sounds and rhonchi. Examination of the left-lower field reveals decreased breath sounds and rhonchi. Decreased breath sounds, wheezing and rhonchi present. No rales.  Abdominal:     Palpations: Abdomen is soft.     Tenderness: There is no abdominal tenderness.  Musculoskeletal:     Right lower leg: No swelling.     Left lower leg: No swelling.  Skin:    General: Skin is warm.     Findings: No rash.  Neurological:     Mental Status: She is alert and oriented to person, place, and time.     Data Reviewed: ABG showing a pH of 7.27 pCO2 of 70 Creatinine 8.68, phosphorus 6.1, magnesium  2.5, white blood cell count 11.6, hemoglobin 10.6, platelet count 141  Family Communication: Left message for Sadie  Disposition: Status is: Inpatient Remains inpatient appropriate because: Still with bronchospasm continue steroids and antibiotic  Planned Discharge Destination: Home    Time spent: 28 minutes  Author: Charlie Patterson,  MD 02/01/2024 2:32 PM  For on call review [email or web address].  "

## 2024-02-02 DIAGNOSIS — J188 Other pneumonia, unspecified organism: Secondary | ICD-10-CM | POA: Diagnosis not present

## 2024-02-02 DIAGNOSIS — I5031 Acute diastolic (congestive) heart failure: Secondary | ICD-10-CM | POA: Diagnosis not present

## 2024-02-02 DIAGNOSIS — J441 Chronic obstructive pulmonary disease with (acute) exacerbation: Secondary | ICD-10-CM | POA: Diagnosis not present

## 2024-02-02 DIAGNOSIS — J9601 Acute respiratory failure with hypoxia: Secondary | ICD-10-CM | POA: Diagnosis not present

## 2024-02-02 LAB — LEGIONELLA PNEUMOPHILA SEROGP 1 UR AG: L. pneumophila Serogp 1 Ur Ag: NEGATIVE

## 2024-02-02 NOTE — Progress Notes (Signed)
 " [facility]  PROGRESS NOTE   Subjective:   Patient seen at bedside.  Feels much better.  Objective:  Vital signs: Blood pressure (!) 133/96, pulse 94, temperature 98.2 F (36.8 C), resp. rate 19, height 5' 4 (1.626 m), weight 66.5 kg, SpO2 91%.  Intake/Output Summary (Last 24 hours) at 02/02/2024 1412 Last data filed at 02/02/2024 0900 Gross per 24 hour  Intake 480 ml  Output --  Net 480 ml   Filed Weights   02/01/24 0731 02/01/24 1111 02/02/24 0500  Weight: 66.3 kg 64.3 kg 66.5 kg     Physical Exam: General:  No acute distress  Head:  Normocephalic, atraumatic. Moist oral mucosal membranes  Eyes:  Anicteric  Neck:  Supple  Lungs:   Clear to auscultation, normal effort  Heart:  S1S2 no rubs  Abdomen:   Soft, nontender, bowel sounds present  Extremities:  peripheral edema.  Neurologic:  Awake, alert, following commands  Skin:  No lesions  Access:     Basic Metabolic Panel: Recent Labs  Lab 01/29/24 0711 01/29/24 1150 01/30/24 0339 01/31/24 0334 02/01/24 0512  NA 141  --  138 136 138  K 5.2*  --  3.4* 4.3 4.1  CL 103  --  96* 95* 96*  CO2 24  --  26 26 32  GLUCOSE 114*  --  88 119* 105*  BUN 69*  --  33* 48* 66*  CREATININE 9.37* 9.51* 5.38* 7.21* 8.68*  CALCIUM  8.2*  --  7.5* 7.3* 7.3*  MG  --   --  1.9 2.2 2.5*  PHOS  --   --  4.0 5.0* 6.1*   GFR: Estimated Creatinine Clearance: 5.5 mL/min (A) (by C-G formula based on SCr of 8.68 mg/dL (H)).  Liver Function Tests: Recent Labs  Lab 01/29/24 0711 01/30/24 0339 01/31/24 0334  AST 31 22  --   ALT 25 17  --   ALKPHOS 99 71  --   BILITOT 0.4 0.4  --   PROT 7.6 5.9*  --   ALBUMIN 4.1 3.2* 3.5   No results for input(s): LIPASE, AMYLASE in the last 168 hours. Recent Labs  Lab 01/29/24 1150  AMMONIA 50*    CBC: Recent Labs  Lab 01/29/24 0711 01/29/24 1150 01/30/24 0339 01/31/24 0334 02/01/24 0512  WBC 10.2 10.4 7.4 10.6* 11.6*  NEUTROABS 8.9*  --   --   --   --   HGB  10.8* 10.1* 9.3* 9.9* 10.6*  HCT 36.3 35.5* 30.4* 31.6* 34.0*  MCV 100.6* 105.3* 98.7 97.5 98.6  PLT 170 164 122* 140* 141*     HbA1C: No results found for: HGBA1C  Urinalysis: No results for input(s): COLORURINE, LABSPEC, PHURINE, GLUCOSEU, HGBUR, BILIRUBINUR, KETONESUR, PROTEINUR, UROBILINOGEN, NITRITE, LEUKOCYTESUR in the last 72 hours.  Invalid input(s): APPERANCEUR    Imaging: No results found.   Medications:    azithromycin  500 mg (02/02/24 1100)   cefTRIAXone  (ROCEPHIN )  IV 2 g (02/02/24 1005)    allopurinol   100 mg Oral Daily   vitamin C   500 mg Oral BID   aspirin  EC  325 mg Oral Daily   Chlorhexidine  Gluconate Cloth  6 each Topical Q0600   feeding supplement (NEPRO CARB STEADY)  237 mL Oral TID BM   heparin   5,000 Units Subcutaneous Q8H   ipratropium-albuterol   3 mL Nebulization TID   losartan   12.5 mg Oral QHS   metoprolol   25 mg Oral q morning   multivitamin  1 tablet  Oral QHS    Assessment/ Plan:     73 year old female with history of COPD, hypertension, congestive heart failure, hepatitis C, hyperlipidemia, end-stage renal disease on dialysis with anemia secondary hyperparathyroidism now admitted with history of respiratory failure.   #1: ESRD: Patient has been on a TTS schedule for dialysis.  Will schedule her for dialysis tomorrow.  Patient is advised on the importance of fluid restriction.   #2: Respiratory failure: Patient initially was intubated now extubated and is comfortable on room air.  She was given antibiotics and also has been on Solu-Medrol .   #3: Anemia: Patient has anemia of chronic kidney disease.  Will continue anemia protocols.   #4: Secondary hyperparathyroidism: Will continue to monitor PTH, calcium  and phosphorus levels.  Labs and medications reviewed. Will continue to follow along with you.   LOS: 4 Stewart Pimenta, MD [facility] 12/21/20252:12 PM  "

## 2024-02-02 NOTE — Evaluation (Signed)
 Physical Therapy Evaluation Patient Details Name: Anna Key MRN: [id number] DOB: October 31, 1950 Today's Date: 02/02/2024  History of Present Illness  Pt admitted to [facility] on 01/29/24 for c/o SOB, cough, and confusion secondary to acute hypoxic on chronic hypercapnic respiratory failure in setting of multifocal PNA. Required intubation (12/17-12/18) after failed trial of BiPAP. Significant PMH includes: COPD on home oxygen  2 L via nasal cannula, ESRD on hemodialysis, anemia, CHF, hep C, HTN, HLD.   Clinical Impression  Pt received seated EOB with niece at bedside; they are agreeable for PT eval. At baseline, pt is IND with ADL's, IADL's, ambulation without AD, and medication management.   Pt presents with increased O2 dependence from baseline, decreased activity tolerance, and mild balance deficits, resulting in impaired functional mobility from baseline. Due to deficits, pt required supervision for transfers and 5ft of ambulation without AD. Desat to 83% on 2L requiring titration to 3L (see general comments for details).   Deficits limit the pt's ability to safely and independently perform ADL's, transfer, and ambulate. Pt will benefit from acute skilled PT services to address deficits for return to baseline function. Pt will benefit from post acute therapy services to address deficits for return to baseline function.  Encourage OOB mobility with nursing and mobility tech for meals and toileting for continued progress towards goals and maintenance of IND with functional mobility while hospitalized.          If plan is discharge home, recommend the following: A little help with bathing/dressing/bathroom;Assistance with cooking/housework;Direct supervision/assist for medications management           Functional Status Assessment Patient has had a recent decline in their functional status and demonstrates the ability to make significant improvements in function in a reasonable and  predictable amount of time.     Precautions / Restrictions Precautions Precaution/Restrictions Comments: fluid restriction ( ), SpO2 >/= 92%      Mobility  Bed Mobility               General bed mobility comments: pt received and left seated EOB    Transfers                   General transfer comment: supervision for safety to perform multiple STS from EOB without AD    Ambulation/Gait               General Gait Details: supervision for safety to ambulate 46ft at EOB without AD, intermittent UE support on sink/counter for steadying, no evidence of imbalance     Balance Overall balance assessment: Mild deficits observed, not formally tested                                           Pertinent Vitals/Pain Pain Assessment Pain Assessment: No/denies pain    Home Living Family/patient expects to be discharged to:: Private residence Living Arrangements: Spouse/significant other Available Help at Discharge: Family;Available 24 hours/day (spouse has dementia) Type of Home: Apartment Home Access: Level entry       Home Layout: One level Home Equipment: Agricultural Consultant (2 wheels);Tub bench Additional Comments: 2L O2 at baseline    Prior Function               Mobility Comments: IND with ambulation, no hx of falls ADLs Comments: mod I for ADL's, IADL's, medication management, and driving     Extremity/Trunk  Assessment   Upper Extremity Assessment Upper Extremity Assessment: Overall WFL for tasks assessed    Lower Extremity Assessment Lower Extremity Assessment: Overall WFL for tasks assessed       Communication   Communication Communication: No apparent difficulties    Cognition Arousal: Alert Behavior During Therapy: WFL for tasks assessed/performed   PT - Cognitive impairments: No apparent impairments, Orientation   Orientation impairments: Situation                   PT - Cognition Comments:  reports not knowing why she came to hospital Following commands: Intact       Cueing Cueing Techniques: Verbal cues, Tactile cues, Visual cues     General Comments General comments (skin integrity, edema, etc.): received on 4L O2 via [place] with SpO2 at 97%. Weaned to 2L with desat to 83% after ambulation, unable to recover with seated rest break and cues for pursed lip breathing. Required titration to 3L with recovery >90%. RN notified.    Exercises Other Exercises Other Exercises: Pt/family edu re: PT role/POC, DC recommendations, safety with functional mobility, energy conservation with assist from family and DME, SpO2 reading and O2 requirements, OOB for meals and toileting, pursed lip breathing.   Assessment/Plan    PT Assessment Patient needs continued PT services  PT Problem List Decreased activity tolerance;Decreased balance;Decreased mobility;Cardiopulmonary status limiting activity       PT Treatment Interventions DME instruction;Gait training;Functional mobility training;Therapeutic activities;Therapeutic exercise;Balance training;Neuromuscular re-education;Patient/family education    PT Goals (Current goals can be found in the Care Plan section)  Acute Rehab PT Goals Patient Stated Goal: go home PT Goal Formulation: With patient/family Time For Goal Achievement: 02/16/24 Potential to Achieve Goals: Good    Frequency Min 2X/week        AM-PAC PT 6 Clicks Mobility  Outcome Measure Help needed turning from your back to your side while in a flat bed without using bedrails?: None Help needed moving from lying on your back to sitting on the side of a flat bed without using bedrails?: None Help needed moving to and from a bed to a chair (including a wheelchair)?: A Little Help needed standing up from a chair using your arms (e.g., wheelchair or bedside chair)?: A Little Help needed to walk in hospital room?: A Little Help needed climbing 3-5 steps with a railing? : A  Little 6 Click Score: 20    End of Session Equipment Utilized During Treatment: Oxygen  (2-4L) Activity Tolerance: Patient tolerated treatment well Patient left: in bed;with call bell/phone within reach;with family/visitor present (left seated EOB with RT; no bed alarm indicated, pt safe for IND transfer to/from [facility] for toileting) Nurse Communication: Mobility status PT Visit Diagnosis: Difficulty in walking, not elsewhere classified (R26.2)    Time: 8680-8648 PT Time Calculation (min) (ACUTE ONLY): 32 min   Charges:   PT Evaluation $PT Eval Moderate Complexity: 1 Mod PT Treatments $Therapeutic Activity: 8-22 mins PT General Charges $$ ACUTE PT VISIT: 1 Visit       Camie CHARLENA Kluver, PT, DPT 3:41 PM,02/02/2024 Physical Therapist - [place] [facility]

## 2024-02-02 NOTE — Care Management Important Message (Signed)
 Important Message  Patient Details  Name: Anna Key MRN: [id number] Date of Birth: Mar 26, 1950   Important Message Given:  Yes - Medicare IM     [name] 02/02/2024, 1:23 PM

## 2024-02-02 NOTE — Plan of Care (Signed)

## 2024-02-02 NOTE — Progress Notes (Addendum)
 " Progress Note   Patient: Anna Key FMW:[id number] DOB: [birth year] DOA: 01/29/2024     4 DOS: the patient was seen and examined on 02/02/2024   Brief hospital course: 73 y.o. female with medical history significant for COPD on home oxygen  2 L via nasal cannula, ESRD on hemodialysis, anemia, CHF, hep C, HTN, HLD who was brought into the ED on 01/29/24 for concern for shortness of breath, cough and confusion.  Patient is currently intubated and sedated and unable to contribute to history, therefore history is obtained from chart review.    It is reported that EMS found her hypoxic with O2 saturations of 82% on room air, confused and lethargic.  Reportedly the patient endorsed a productive cough for a few days along with audible wheezing.   ED Course: Initial Vital Signs: Temperature 98.6 F orally, RR 21, pulse 83, BP 176/91, SpO2 80% on room air Significant Labs: Potassium 5.2, glucose 114, BUN 69, Creatinine 9.3, Pro-BNP >35000, lactic acid 0.9, procalcitonin 0.48, WBC 10.2 Imaging Chest X-ray>>IMPRESSION: 1. Increased interstitial markings and trace bilateral pleural effusions concerning for mild congestive heart failure. . CT Head wo contrast>>IMPRESSION: 1. No acute findings. 2. Mild chronic ischemic microvascular disease. Medications Administered: 80 mg IV Lasix , Levaquin , duonebs, 500 cc NS bolus    TRH asked to admit for further workup and treatment.  Found to be Hypercapnic and was placed on BiPAP, but ultimately with poor tolerance of BiPAP with worsening hypoxia, required intubation in the ED.  PCCM consulted.   12/17: Presented to ED due to shortness of breath and AMS.  Placed on BiPAP, TRH asked to admit with plans for urgent HD.  Remained hypoxic and altered despite BiPAP requiring intubation.  PCCM consulted.  12/18: Received HD last night, Viral panel positive for Rhinovirus.  On minimal vent support, plan for WUA & SBT as tolerated utilizing Precedex .  Patient  extubated.   12/19.  Patient transferred to medical service.  Stable for transfer out of the ICU. 12/20.  Patient still with shortness of breath and wheezing.  VBG showing a pCO2 of 70.  Will try to get noninvasive ventilation approved at night.  BiPAP ordered at night.  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  Patient had severe acidosis on presentation.  Extubated on 12/18.  Currently back on 3 L of oxygen  which slightly higher than her chronic requirement.  VBG this morning shows she is still retaining with a pCO2 of 70.  Will do a BiPAP trial.  Consulted transitional care team to try to get noninvasive ventilation approved.  Multifocal pneumonia Unclear if viral or bacterial.  Empirically on Rocephin  and Zithromax .  Likely secondary to rhinovirus.  COPD with acute exacerbation (HCC) Completed Solu-Medrol  and continue nebulizer treatments.  Patient will benefit from noninvasive ventilation at night with elevation of pCO2 to avoid hospital readmissions and improve quality of life.  Acute diastolic CHF (congestive heart failure) (HCC) Echocardiogram with an EF of 60%, moderately elevated pulmonary artery systolic pressure, moderate aortic valve regurgitation and moderate aortic valve stenosis.  Dialysis to manage fluid  End stage renal disease on dialysis [facility]) Dialysis done Saturday  Acute metabolic encephalopathy Some intermittent confusion.  Likely secondary to severe acidosis and CO2 retention on admission.  Will discontinue Solu-Medrol .  Pressure injury of skin Wound 01/29/24 1545 Pressure Injury Sacrum Mid;Lower Stage 2 -  Partial thickness loss of dermis presenting as a shallow  open injury with a red, pink wound bed without slough. (Active)   Present on admission  Hyperkalemia Dialysis helped manage  Anemia in chronic kidney disease Hemoglobin 10.6  Essential  hypertension Restarted low-dose losartan  and Toprol         Subjective: Patient only able to wear BiPAP for a few minutes last night.  Wanted try again tonight to see how she does.  Admitted with acute respiratory failure and acidosis and was intubated in the emergency room.  Physical Exam: Vitals:   02/02/24 0356 02/02/24 0400 02/02/24 0500 02/02/24 0817  BP:  119/74  (!) 133/96  Pulse: 80 82  94  Resp: 18   19  Temp: 99.3 F (37.4 C)   98.2 F (36.8 C)  TempSrc: Oral     SpO2: 96% 95%  91%  Weight:   66.5 kg   Height:       Physical Exam HENT:     Head: Normocephalic.     Mouth/Throat:     Pharynx: No oropharyngeal exudate.  Eyes:     General: Lids are normal.     Conjunctiva/sclera: Conjunctivae normal.  Cardiovascular:     Rate and Rhythm: Normal rate and regular rhythm.     Heart sounds: Normal heart sounds, S1 normal and S2 normal.  Pulmonary:     Breath sounds: Examination of the right-lower field reveals decreased breath sounds and wheezing. Examination of the left-lower field reveals decreased breath sounds and wheezing. Decreased breath sounds and wheezing present. No rhonchi or rales.  Abdominal:     Palpations: Abdomen is soft.     Tenderness: There is no abdominal tenderness.  Musculoskeletal:     Right lower leg: No swelling.     Left lower leg: No swelling.  Skin:    General: Skin is warm.     Findings: No rash.  Neurological:     Mental Status: She is alert and oriented to person, place, and time.     Data Reviewed: pCO2 of 78, 8.68, white blood cell count 11.6, platelet count 141, hemoglobin 10.6  Family Communication: Update Dionne on phone  Disposition:Remains inpatient appropriate because: Will see if she can wear the BiPAP more this evening.  Will discontinue Solu-Medrol  today.  Trying to set up noninvasive ventilation at night.    Planned Discharge Destination: Home    Time spent: 28 minutes  Author: Charlie Patterson, MD 02/02/2024  1:07 PM  For on call review [email or web address].  "

## 2024-02-02 NOTE — Plan of Care (Signed)
" °  Problem: Activity: Goal: Ability to tolerate increased activity will improve Outcome: Progressing   Problem: Respiratory: Goal: Ability to maintain a clear airway and adequate ventilation will improve Outcome: Progressing   Problem: Activity: Goal: Ability to tolerate increased activity will improve Outcome: Progressing   Problem: Respiratory: Goal: Ability to maintain adequate ventilation will improve Outcome: Progressing Goal: Ability to maintain a clear airway will improve Outcome: Progressing   Problem: Clinical Measurements: Goal: Ability to maintain clinical measurements within normal limits will improve Outcome: Progressing Goal: Will remain free from infection Outcome: Progressing Goal: Diagnostic test results will improve Outcome: Progressing Goal: Respiratory complications will improve Outcome: Progressing Goal: Cardiovascular complication will be avoided Outcome: Progressing   Problem: Safety: Goal: Ability to remain free from injury will improve Outcome: Progressing   Problem: Skin Integrity: Goal: Risk for impaired skin integrity will decrease Outcome: Progressing   "

## 2024-02-02 NOTE — Progress Notes (Signed)
 The pt. requested multiple times to remove bipap for the rest of the night. This nurse attempted to educate the patient about the benefit and plan of care in regards to the bedtime usage. The patient continues to decline to keep it on for tonight.  O2 @ 4L [place] reapplied. O2 sats within normal limits. Call bell within reach. Instructed to call for assistance.

## 2024-02-03 ENCOUNTER — Other Ambulatory Visit: Payer: Self-pay

## 2024-02-03 ENCOUNTER — Encounter: Payer: Self-pay | Admitting: Oncology

## 2024-02-03 DIAGNOSIS — E790 Hyperuricemia without signs of inflammatory arthritis and tophaceous disease: Secondary | ICD-10-CM

## 2024-02-03 DIAGNOSIS — J9602 Acute respiratory failure with hypercapnia: Secondary | ICD-10-CM

## 2024-02-03 DIAGNOSIS — N186 End stage renal disease: Secondary | ICD-10-CM

## 2024-02-03 DIAGNOSIS — J189 Pneumonia, unspecified organism: Secondary | ICD-10-CM

## 2024-02-03 LAB — CULTURE, BLOOD (ROUTINE X 2)
Culture: NO GROWTH
Culture: NO GROWTH
Special Requests: ADEQUATE
Special Requests: ADEQUATE

## 2024-02-03 LAB — CBC
HCT: 32.4 % — ABNORMAL LOW (ref 36.0–46.0)
HCT: 32.2 % — ABNORMAL LOW (ref 36.0–46.0)
Hemoglobin: 9.7 g/dL — ABNORMAL LOW (ref 12.0–15.0)
Hemoglobin: 9.5 g/dL — ABNORMAL LOW (ref 12.0–15.0)
MCH: 30.1 pg (ref 26.0–34.0)
MCH: 30 pg (ref 26.0–34.0)
MCHC: 29.9 g/dL — ABNORMAL LOW (ref 30.0–36.0)
MCHC: 29.5 g/dL — ABNORMAL LOW (ref 30.0–36.0)
MCV: 100.6 fL — ABNORMAL HIGH (ref 80.0–100.0)
MCV: 101.6 fL — ABNORMAL HIGH (ref 80.0–100.0)
Platelets: 146 K/uL — ABNORMAL LOW (ref 150–400)
Platelets: 148 K/uL — ABNORMAL LOW (ref 150–400)
RBC: 3.22 MIL/uL — ABNORMAL LOW (ref 3.87–5.11)
RBC: 3.17 MIL/uL — ABNORMAL LOW (ref 3.87–5.11)
RDW: 16.1 % — ABNORMAL HIGH (ref 11.5–15.5)
RDW: 16.3 % — ABNORMAL HIGH (ref 11.5–15.5)
WBC: 8.8 K/uL (ref 4.0–10.5)
WBC: 9.1 K/uL (ref 4.0–10.5)
nRBC: 0.3 % — ABNORMAL HIGH (ref 0.0–0.2)
nRBC: 0.2 % (ref 0.0–0.2)

## 2024-02-03 LAB — RENAL FUNCTION PANEL
Albumin: 3.4 g/dL — ABNORMAL LOW (ref 3.5–5.0)
Albumin: 3.4 g/dL — ABNORMAL LOW (ref 3.5–5.0)
Anion gap: 13 (ref 5–15)
Anion gap: 13 (ref 5–15)
BUN: 59 mg/dL — ABNORMAL HIGH (ref 8–23)
BUN: 60 mg/dL — ABNORMAL HIGH (ref 8–23)
CO2: 28 mmol/L (ref 22–32)
CO2: 27 mmol/L (ref 22–32)
Calcium: 7 mg/dL — ABNORMAL LOW (ref 8.9–10.3)
Calcium: 7 mg/dL — ABNORMAL LOW (ref 8.9–10.3)
Chloride: 99 mmol/L (ref 98–111)
Chloride: 98 mmol/L (ref 98–111)
Creatinine, Ser: 7.78 mg/dL — ABNORMAL HIGH (ref 0.44–1.00)
Creatinine, Ser: 7.87 mg/dL — ABNORMAL HIGH (ref 0.44–1.00)
GFR, Estimated: 5 mL/min — ABNORMAL LOW
GFR, Estimated: 5 mL/min — ABNORMAL LOW
Glucose, Bld: 129 mg/dL — ABNORMAL HIGH (ref 70–99)
Glucose, Bld: 138 mg/dL — ABNORMAL HIGH (ref 70–99)
Phosphorus: 5.9 mg/dL — ABNORMAL HIGH (ref 2.5–4.6)
Phosphorus: 5.8 mg/dL — ABNORMAL HIGH (ref 2.5–4.6)
Potassium: 4.5 mmol/L (ref 3.5–5.1)
Potassium: 4.3 mmol/L (ref 3.5–5.1)
Sodium: 139 mmol/L (ref 135–145)
Sodium: 138 mmol/L (ref 135–145)

## 2024-02-03 MED ORDER — ASPIRIN 325 MG PO TBEC
325.0000 mg | DELAYED_RELEASE_TABLET | Freq: Every day | ORAL | 0 refills | Status: AC
  Filled 2024-02-03: qty 30, 30d supply, fill #0

## 2024-02-03 MED ORDER — ALLOPURINOL 100 MG PO TABS
100.0000 mg | ORAL_TABLET | Freq: Every morning | ORAL | 0 refills | Status: AC
  Filled 2024-02-03: qty 30, 30d supply, fill #0

## 2024-02-03 MED ORDER — NEPRO/CARBSTEADY PO LIQD
237.0000 mL | Freq: Three times a day (TID) | ORAL | 0 refills | Status: AC
  Filled 2024-02-03: qty 21330, 30d supply, fill #0

## 2024-02-03 MED ORDER — LOSARTAN POTASSIUM 25 MG PO TABS
25.0000 mg | ORAL_TABLET | Freq: Every day | ORAL | 0 refills | Status: AC
  Filled 2024-02-03: qty 30, 30d supply, fill #0

## 2024-02-03 MED ORDER — ASCORBIC ACID 500 MG PO TABS
500.0000 mg | ORAL_TABLET | Freq: Every day | ORAL | 0 refills | Status: AC
  Filled 2024-02-03: qty 30, 30d supply, fill #0

## 2024-02-03 MED ORDER — RENA-VITE PO TABS
1.0000 | ORAL_TABLET | Freq: Every day | ORAL | 0 refills | Status: AC
  Filled 2024-02-03: qty 30, 30d supply, fill #0

## 2024-02-03 MED ORDER — PENTAFLUOROPROP-TETRAFLUOROETH EX AERO
INHALATION_SPRAY | CUTANEOUS | Status: AC
  Filled 2024-02-03: qty 30

## 2024-02-03 MED ORDER — METOPROLOL SUCCINATE ER 25 MG PO TB24
25.0000 mg | ORAL_TABLET | Freq: Every morning | ORAL | 0 refills | Status: AC
  Filled 2024-02-03: qty 30, 30d supply, fill #0

## 2024-02-03 NOTE — Plan of Care (Signed)
" °  Problem: Activity: Goal: Ability to tolerate increased activity will improve Outcome: Progressing   Problem: Respiratory: Goal: Ability to maintain a clear airway and adequate ventilation will improve Outcome: Progressing   Problem: Activity: Goal: Ability to tolerate increased activity will improve Outcome: Progressing   "

## 2024-02-03 NOTE — Progress Notes (Signed)
 D/C order noted. Contacted [facility] 10:45am of d/c. Requested documents faxed to clinic for continuation of care.   Suzen Satchel Dialysis Navigator [phone]

## 2024-02-03 NOTE — TOC Transition Note (Signed)
 Transition of Care [facility]) - Discharge Note   Patient Details  Name: Anna Key MRN: [id number] Date of Birth: 1950-07-08  Transition of Care [facility]) CM/SW Contact:  Lauraine JAYSON Carpen, LCSW Phone Number: 02/03/2024, 3:31 PM   Clinical Narrative: Patient has orders to discharge home today. [employer] will plan to come to the home later today to deliver the NIV. She received temporary approval. CSW provided CMS scores for home health agencies that can accept her. Patient chose [facility]. Liaison is aware. No further concerns. CSW signing off.    Final next level of care: Home w Home Health Services Barriers to Discharge: No Barriers Identified   Patient Goals and CMS Choice   CMS Medicare.gov Compare Post Acute Care list provided to:: Patient Choice offered to / list presented to : Patient      Discharge Placement                Patient to be transferred to facility by: Niece   Patient and family notified of of transfer: 02/03/24  Discharge Plan and Services Additional resources added to the After Visit Summary for       Post Acute Care Choice: Durable Medical Equipment                    HH Arranged: PT [facility] Agency: [facility] Date [facility] Agency Contacted: 02/03/24   Representative spoke with at [facility] Agency: Darleene  Social Drivers of Health (SDOH) Interventions SDOH Screenings   Food Insecurity: Patient Unable To Answer (01/29/2024)  Housing: Patient Unable To Answer (01/29/2024)  Transportation Needs: Patient Unable To Answer (01/29/2024)  Utilities: Patient Unable To Answer (01/29/2024)  Depression (PHQ2-9): Low Risk (09/19/2021)  Social Connections: Patient Unable To Answer (01/29/2024)  Tobacco Use: High Risk (01/29/2024)     Readmission Risk Interventions     No data to display

## 2024-02-03 NOTE — TOC CM/SW Note (Signed)
 Per MD, patient needs NIV at discharge. Per chart review, patient gets her oxygen  through [employer]. CSW ordered NIV through [employer]. Will talk to patient about home health when she returns from HD.  Lauraine Carpen, CSW [phone]

## 2024-02-03 NOTE — Discharge Summary (Addendum)
 " Physician Discharge Summary   Patient: Anna Key MRN: [id number] DOB: 08-19-1950  Admit date:     01/29/2024  Discharge date: 02/03/2024  Discharge Physician: Charlie Patterson   PCP: Lenon Layman ORN, MD   Recommendations at discharge:   Follow-up PCP 5 days Dialysis schedule be Wednesday or Friday this week because of the Christmas holiday.  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 female with medical history significant for COPD on home oxygen  2 L via nasal cannula, ESRD on hemodialysis, anemia, CHF, hep C, HTN, HLD who was brought into the ED on 01/29/24 for concern for shortness of breath, cough and confusion.  Patient is currently intubated and sedated and unable to contribute to history, therefore history is obtained from chart review.    It is reported that EMS found her hypoxic with O2 saturations of 82% on room air, confused and lethargic.  Reportedly the patient endorsed a productive cough for a few days along with audible wheezing.   ED Course: Initial Vital Signs: Temperature 98.6 F orally, RR 21, pulse 83, BP 176/91, SpO2 80% on room air Significant Labs: Potassium 5.2, glucose 114, BUN 69, Creatinine 9.3, Pro-BNP >35000, lactic acid 0.9, procalcitonin 0.48, WBC 10.2 Imaging Chest X-ray>>IMPRESSION: 1. Increased interstitial markings and trace bilateral pleural effusions concerning for mild congestive heart failure. . CT Head wo contrast>>IMPRESSION: 1. No acute  findings. 2. Mild chronic ischemic microvascular disease. Medications Administered: 80 mg IV Lasix , Levaquin , duonebs, 500 cc NS bolus    TRH asked to admit for further workup and treatment.  Found to be Hypercapnic and was placed on BiPAP, but ultimately with poor tolerance of BiPAP with worsening hypoxia, required intubation in the ED.  PCCM consulted.   12/17: Presented to ED due to shortness of breath and AMS.  Placed on BiPAP, TRH asked to admit with plans for urgent HD.  Remained hypoxic and altered despite BiPAP requiring intubation.  PCCM consulted.  12/18: Received HD last night, Viral panel positive for Rhinovirus.  On minimal vent support, plan for WUA & SBT as tolerated utilizing Precedex .  Patient extubated.   12/19.  Patient transferred to medical service.  Stable for transfer out of the ICU. 12/20.  Patient still with shortness of breath and wheezing.  VBG showing a pCO2 of 70.  Will try to get noninvasive ventilation approved at night.  BiPAP ordered at night. 12/22.  Patient had dialysis today will be switching to Monday Wednesday Friday dialysis schedule for the holiday.  Hemoglobin 9.5.  Stable for discharge.   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  Patient had severe acidosis on presentation.  Extubated on 12/18.  Currently back on 3 L of oxygen  which slightly higher than her chronic requirement.  VBG shows she is still retaining with a pCO2 of 70.  Will do a BiPAP trial.  Transitional care team to set up NIV (noninvasive ventilation) trilogy  at night.  Multifocal pneumonia Unclear if viral or bacterial.  Completed Rocephin  and Zithromax .  Likely secondary to rhinovirus.  COPD with acute exacerbation (HCC) Completed Solu-Medrol  and nebulizer treatments.  Due to the severity of the patient's chronic respiratory failure, secondary to COPD, the patient requires volume targeted  pressure support from Home Mechanical Ventilation. BIPAP, BIPAP ST/ STA (RAD) have been ruled out. The patient has been on NIV settings here in the hospital for over 24 hours prior to discharge. RAD with or without back up rate does not supply volume targeted ventilation to manage the patients Hypercapnia. OSA is not the predominant cause of their hypercapnia, and HMV is not being ordered to treat OSA. The patient requires ventilatory support for more than eight hours per 24-hour period to normalize the patient's PaCO2 and prevent readmissions. Failure to adequately ventilate will result in serious harm and/or death to the patient.   Acute diastolic CHF (congestive heart failure) (HCC) Echocardiogram with an EF of 60%, moderately elevated pulmonary artery systolic pressure, moderate aortic valve regurgitation and moderate aortic valve stenosis.  Dialysis to manage fluid.  End stage renal disease on dialysis [facility]) Dialysis done Monday  Acute metabolic encephalopathy Some intermittent confusion.  Likely secondary to severe acidosis and CO2 retention on admission.  Will discontinue Solu-Medrol .  Will benefit from noninvasive ventilation at night.  Pressure injury of skin Wound 01/29/24 1545 Pressure Injury Sacrum Mid;Lower Stage 2 -  Partial thickness loss of dermis presenting as a shallow open injury with a red, pink wound bed without slough. (Active)   Present on admission  Hyperkalemia Dialysis helped manage  Anemia in chronic kidney disease Hemoglobin 9.5  Essential hypertension Restarted low-dose losartan  and Toprol          Consultants: Critical care team Procedures performed: Intubation extubation Disposition: Home Diet recommendation:  Renal diet DISCHARGE MEDICATION: Allergies as of 02/03/2024       Reactions   Penicillins Hives, Rash   Has patient had a PCN reaction causing immediate rash, facial/tongue/throat swelling, SOB or lightheadedness with hypotension: Yes Has  patient had a PCN reaction causing severe rash involving mucus membranes or skin necrosis: Yes--all over body Has patient had a PCN reaction that required hospitalization: No  Has patient had a PCN reaction occurring within the last 10 years: No If all of the above answers are NO, then may proceed with Cephalosporin use.        Medication List     STOP taking these medications    amLODipine  10 MG tablet Commonly known as: NORVASC    atorvastatin  40 MG tablet Commonly known as: LIPITOR   calcitRIOL  0.25 MCG capsule Commonly known as: ROCALTROL    isosorbide  mononitrate 60 MG 24 hr tablet Commonly known as: IMDUR        TAKE these medications    allopurinol  100 MG tablet Commonly known as: ZYLOPRIM  Take 1 tablet (100 mg total) by mouth every morning.   ascorbic acid  500 MG tablet Commonly known as: VITAMIN C  Take 1 tablet (500 mg total) by mouth daily.   aspirin  EC 325 MG tablet Take 1 tablet (325 mg total) by mouth daily.   b complex-vitamin c -folic acid  0.8 MG Tabs tablet Take 1 tablet by mouth at bedtime.   feeding supplement (NEPRO CARB STEADY) Liqd Take 237 mLs by mouth 3 (three) times daily between meals.   losartan  25 MG tablet Commonly known as: COZAAR  Take 1 tablet (25 mg total) by mouth at bedtime.   metoprolol  succinate 25 MG  24 hr tablet Commonly known as: TOPROL -XL Take 1 tablet (25 mg total) by mouth every morning. Start taking on: February 04, 2024 What changed:  medication strength how much to take        Follow-up Information     Lenon Layman ORN, MD Follow up on 02/11/2024.   Specialty: Internal Medicine Why: at [address] Contact information: [address] [facility] [place] [address] [phone]                Discharge Exam: Fredricka Weights   02/01/24 1111 02/02/24 0500 02/03/24 0752  Weight: 64.3 kg 66.5 kg 68 kg   Physical Exam HENT:     Head: Normocephalic.     Mouth/Throat:     Pharynx:  No oropharyngeal exudate.  Eyes:     General: Lids are normal.     Conjunctiva/sclera: Conjunctivae normal.  Cardiovascular:     Rate and Rhythm: Normal rate and regular rhythm.     Heart sounds: S1 normal and S2 normal. Murmur heard.     Systolic murmur is present with a grade of 3/6.  Pulmonary:     Breath sounds: Examination of the right-lower field reveals decreased breath sounds. Examination of the left-lower field reveals decreased breath sounds. Decreased breath sounds present. No wheezing, rhonchi or rales.  Abdominal:     Palpations: Abdomen is soft.     Tenderness: There is no abdominal tenderness.  Musculoskeletal:     Right lower leg: No swelling.     Left lower leg: No swelling.  Skin:    General: Skin is warm.     Findings: No rash.  Neurological:     Mental Status: She is alert and oriented to person, place, and time.      Condition at discharge: fair  The results of significant diagnostics from this hospitalization (including imaging, microbiology, ancillary and laboratory) are listed below for reference.   Imaging Studies: ECHOCARDIOGRAM COMPLETE Result Date: 01/30/2024    ECHOCARDIOGRAM REPORT   Patient Name:   FALISHA OSMENT Lazenby Date of Exam: 01/30/2024 Medical Rec #:  [id number]          Height:       64.0 in Accession #:    [id number]         Weight:       152.8 lb Date of Birth:  1950/08/22          BSA:          1.745 m Patient Age:    73 years           BP:           133/70 mmHg Patient Gender: F                  HR:           86 bpm. Exam Location:  [facility] Procedure: 2D Echo, Cardiac Doppler and Color Doppler (Both Spectral and Color            Flow Doppler were utilized during procedure). Indications:     CHF  History:         Patient has prior history of Echocardiogram examinations, most                  recent 09/20/2020. CHF, CAD; Risk Factors:Hypertension and                  Dyslipidemia. CKD.  Sonographer:     Philomena  Delores Referring Phys:  [id number] [facility]  [place] Diagnosing Phys: Evalene Lunger MD IMPRESSIONS  1. Left ventricular ejection fraction, by estimation, is 60 to 65%. The left ventricle has normal function. The left ventricle has no regional wall motion abnormalities. There is mild left ventricular hypertrophy. Left ventricular diastolic parameters are consistent with Grade I diastolic dysfunction (impaired relaxation).  2. Right ventricular systolic function is normal. The right ventricular size is mildly enlarged. There is moderately elevated pulmonary artery systolic pressure. The estimated right ventricular systolic pressure is 53.7 mmHg.  3. The mitral valve is normal in structure. No evidence of mitral valve regurgitation. No evidence of mitral stenosis.  4. Tricuspid valve regurgitation is mild to moderate.  5. The aortic valve is calcified. Aortic valve regurgitation is moderate. Moderate aortic valve stenosis. Aortic valve mean gradient measures 19.8 mmHg. Aortic valve Vmax measures 3.00 m/s.  6. The inferior vena cava is dilated in size with <50% respiratory variability, suggesting right atrial pressure of 15 mmHg. FINDINGS  Left Ventricle: Left ventricular ejection fraction, by estimation, is 60 to 65%. The left ventricle has normal function. The left ventricle has no regional wall motion abnormalities. Strain was performed and the global longitudinal strain is indeterminate. The left ventricular internal cavity size was normal in size. There is mild left ventricular hypertrophy. Left ventricular diastolic parameters are consistent with Grade I diastolic dysfunction (impaired relaxation). Right Ventricle: The right ventricular size is mildly enlarged. No increase in right ventricular wall thickness. Right ventricular systolic function is normal. There is moderately elevated pulmonary artery systolic pressure. The tricuspid regurgitant velocity is 3.11 m/s, and with an assumed right atrial pressure of 15 mmHg, the estimated right ventricular  systolic pressure is 53.7 mmHg. Left Atrium: Left atrial size was normal in size. Right Atrium: Right atrial size was normal in size. Pericardium: There is no evidence of pericardial effusion. Mitral Valve: The mitral valve is normal in structure. No evidence of mitral valve regurgitation. No evidence of mitral valve stenosis. Tricuspid Valve: The tricuspid valve is normal in structure. Tricuspid valve regurgitation is mild to moderate. No evidence of tricuspid stenosis. Aortic Valve: The aortic valve is calcified. Aortic valve regurgitation is moderate. Aortic regurgitation PHT measures 556 msec. Moderate aortic stenosis is present. Aortic valve mean gradient measures 19.8 mmHg. Aortic valve peak gradient measures 35.9 mmHg. Aortic valve area, by VTI measures 2.21 cm. Pulmonic Valve: The pulmonic valve was normal in structure. Pulmonic valve regurgitation is not visualized. No evidence of pulmonic stenosis. Aorta: The aortic root is normal in size and structure. Venous: The inferior vena cava is dilated in size with less than 50% respiratory variability, suggesting right atrial pressure of 15 mmHg. IAS/Shunts: No atrial level shunt detected by color flow Doppler. Additional Comments: 3D was performed not requiring image post processing on an independent workstation and was indeterminate.  LEFT VENTRICLE PLAX 2D LVIDd:         4.30 cm   Diastology LVIDs:         2.60 cm   LV e' medial:    3.81 cm/s LV PW:         1.10 cm   LV E/e' medial:  27.3 LV IVS:        1.40 cm   LV e' lateral:   5.22 cm/s LVOT diam:     1.90 cm   LV E/e' lateral: 19.9 LV SV:         109 LV SV Index:   63 LVOT Area:  2.84 cm  RIGHT VENTRICLE             IVC RV Basal diam:  3.70 cm     IVC diam: 2.50 cm RV S prime:     16.30 cm/s TAPSE (M-mode): 2.7 cm LEFT ATRIUM             Index        RIGHT ATRIUM           Index LA diam:        3.10 cm 1.78 cm/m   RA Area:     25.00 cm LA Vol (A2C):   49.5 ml 28.37 ml/m  RA Volume:   83.60 ml   47.91 ml/m LA Vol (A4C):   51.4 ml 29.46 ml/m LA Biplane Vol: 55.4 ml 31.75 ml/m  AORTIC VALVE AV Area (Vmax):    2.04 cm AV Area (Vmean):   2.06 cm AV Area (VTI):     2.21 cm AV Vmax:           299.50 cm/s AV Vmean:          203.500 cm/s AV VTI:            0.494 m AV Peak Grad:      35.9 mmHg AV Mean Grad:      19.8 mmHg LVOT Vmax:         216.00 cm/s LVOT Vmean:        148.000 cm/s LVOT VTI:          0.386 m LVOT/AV VTI ratio: 0.78 AI PHT:            556 msec  AORTA Ao Root diam: 3.00 cm MITRAL VALVE                TRICUSPID VALVE MV Area (PHT): 3.23 cm     TR Peak grad:   38.7 mmHg MV Decel Time: 235 msec     TR Vmax:        311.00 cm/s MV E velocity: 104.00 cm/s MV A velocity: 138.00 cm/s  SHUNTS MV E/A ratio:  0.75         Systemic VTI:  0.39 m                             Systemic Diam: 1.90 cm Evalene Lunger MD Electronically signed by Evalene Lunger MD Signature Date/Time: 01/30/2024/2:15:57 PM    Final    DG Chest Port 1 View Result Date: 01/30/2024 CLINICAL DATA:  Acute respiratory failure with hypoxia hypercarbia EXAM: PORTABLE CHEST 1 VIEW COMPARISON:  Yesterday FINDINGS: Stable cardiomegaly. Endotracheal and nasogastric tubes are in grossly good position. Stable mild bibasilar opacities are noted. Bony thorax is unremarkable. IMPRESSION: Stable support apparatus. Stable bibasilar opacities. Electronically Signed   By: Lynwood Landy Raddle M.D.   On: 01/30/2024 09:02   DG Abd 1 View Result Date: 01/29/2024 CLINICAL DATA:  Enteric tube placement. EXAM: ABDOMEN - 1 VIEW COMPARISON:  None Available. FINDINGS: Enteric tube with tip and side-port in the left upper abdomen in the proximal stomach. IMPRESSION: Enteric tube with tip in the proximal stomach. Electronically Signed   By: Vanetta Chou M.D.   On: 01/29/2024 19:47   DG Chest Portable 1 View Result Date: 01/29/2024 CLINICAL DATA:  Post intubation. EXAM: PORTABLE CHEST 1 VIEW COMPARISON:  Chest radiograph dated 01/29/2024. FINDINGS:  Endotracheal tube approximately 4 cm above the carina. Enteric tube with tip in  the proximal stomach and side port distal esophagus or GE junction. Recommend further advancing by additional 7 cm. Bilateral pulmonary opacities as seen on the earlier radiograph. No pneumothorax. Stable cardiomegaly. No acute osseous pathology. IMPRESSION: 1. Endotracheal tube above the carina. 2. Enteric tube with tip in the proximal stomach and side port distal esophagus or GE junction. Recommend further advancing by additional 7 cm. Electronically Signed   By: Vanetta Chou M.D.   On: 01/29/2024 15:15   CT HEAD WO CONTRAST ( ) Result Date: 01/29/2024 CLINICAL DATA:  Altered mental status. EXAM: CT HEAD WITHOUT CONTRAST TECHNIQUE: Contiguous axial images were obtained from the base of the skull through the vertex without intravenous contrast. RADIATION DOSE REDUCTION: This exam was performed according to the departmental dose-optimization program which includes automated exposure control, adjustment of the mA and/or kV according to patient size and/or use of iterative reconstruction technique. COMPARISON:  02/03/2006 FINDINGS: Brain: Cavum septum variant is present. Ventricles, cisterns and other CSF spaces are otherwise unremarkable. Minimal bilateral basal ganglia calcifications are present. There is mild chronic ischemic microvascular disease. No mass, mass effect, shift of midline structures or acute hemorrhage. No evidence of acute infarction. Vascular: No hyperdense vessel or unexpected calcification. Skull: Normal. Negative for fracture or focal lesion. Sinuses/Orbits: Orbits are normal symmetric. Paranasal sinuses are clear as there are hypoplastic frontal sinuses. Other: None. IMPRESSION: 1. No acute findings. 2. Mild chronic ischemic microvascular disease. Electronically Signed   By: Toribio Agreste M.D.   On: 01/29/2024 13:51   DG Chest Portable 1 View Result Date: 01/29/2024 CLINICAL DATA:  Shortness of breath.  EXAM: PORTABLE CHEST 1 VIEW COMPARISON:  01/29/2024 FINDINGS: Lungs are adequately inflated with persistent bibasilar opacification which may be due to vascular congestion versus atelectasis or infection. No significant effusion. Stable cardiomegaly. Remainder of the exam is unchanged. IMPRESSION: Persistent bibasilar opacification which may be due to vascular congestion versus atelectasis or infection. Electronically Signed   By: Toribio Agreste M.D.   On: 01/29/2024 13:41   DG Chest Port 1 View Result Date: 01/29/2024 EXAM: 1 VIEW(S) XRAY OF THE CHEST 01/29/2024 07:37:00 AM COMPARISON: 05/20/2023 CLINICAL HISTORY: Questionable sepsis - evaluate for abnormality FINDINGS: LUNGS AND PLEURA: Increased interstitial markings. Trace bilateral pleural effusions. No focal pulmonary opacity. No pneumothorax. HEART AND MEDIASTINUM: Cardiomegaly. Aortic calcification. BONES AND SOFT TISSUES: No acute osseous abnormality. IMPRESSION: 1. Increased interstitial markings and trace bilateral pleural effusions concerning for mild congestive heart failure. . Electronically signed by: Waddell Calk MD 01/29/2024 07:52 AM EST RP Workstation: [id number]    Microbiology: Results for orders placed or performed during the hospital encounter of 01/29/24  Blood Culture (routine x 2)     Status: None   Collection Time: 01/29/24  7:11 AM   Specimen: BLOOD  Result Value Ref Range Status   Specimen Description BLOOD BLOOD LEFT HAND  Final   Special Requests   Final    BOTTLES DRAWN AEROBIC AND ANAEROBIC Blood Culture adequate volume   Culture   Final    NO GROWTH 5 DAYS Performed at [facility], [address] [place], [place] [address]    Report Status 02/03/2024 FINAL  Final  Blood Culture (routine x 2)     Status: None   Collection Time: 01/29/24  7:12 AM   Specimen: BLOOD  Result Value Ref Range Status   Specimen Description BLOOD RIGHT ANTECUBITAL  Final   Special Requests   Final    BOTTLES DRAWN  AEROBIC AND ANAEROBIC Blood  Culture adequate volume   Culture   Final    NO GROWTH 5 DAYS Performed at [facility] [address] [place], [place] [address]    Report Status 02/03/2024 FINAL  Final  Resp panel by RT-PCR (RSV, Flu A&B, Covid) Anterior Nasal Swab     Status: None   Collection Time: 01/29/24  8:35 AM   Specimen: Anterior Nasal Swab  Result Value Ref Range Status   SARS Coronavirus 2 by RT PCR NEGATIVE NEGATIVE Final    Comment: (NOTE) SARS-CoV-2 target nucleic acids are NOT DETECTED.  The SARS-CoV-2 RNA is generally detectable in upper respiratory specimens during the acute phase of infection. The lowest concentration of SARS-CoV-2 viral copies this assay can detect is 138 copies/mL. A negative result does not preclude SARS-Cov-2 infection and should not be used as the sole basis for treatment or other patient management decisions. A negative result may occur with  improper specimen collection/handling, submission of specimen other than nasopharyngeal swab, presence of viral mutation(s) within the areas targeted by this assay, and inadequate number of viral copies(<138 copies/mL). A negative result must be combined with clinical observations, patient history, and epidemiological information. The expected result is Negative.  Fact Sheet for Patients:  bloggercourse.com  Fact Sheet for Healthcare Providers:  seriousbroker.it  This test is no t yet approved or cleared by the [place]  FDA and  has been authorized for detection and/or diagnosis of SARS-CoV-2 by FDA under an Emergency Use Authorization (EUA). This EUA will remain  in effect (meaning this test can be used) for the duration of the COVID-19 declaration under Section 564(b)(1) of the Act, 21 U.S.C.section 360bbb-3(b)(1), unless the authorization is terminated  or revoked sooner.       Influenza A by PCR NEGATIVE NEGATIVE Final    Influenza B by PCR NEGATIVE NEGATIVE Final    Comment: (NOTE) The Xpert Xpress SARS-CoV-2/FLU/RSV plus assay is intended as an aid in the diagnosis of influenza from Nasopharyngeal swab specimens and should not be used as a sole basis for treatment. Nasal washings and aspirates are unacceptable for Xpert Xpress SARS-CoV-2/FLU/RSV testing.  Fact Sheet for Patients: bloggercourse.com  Fact Sheet for Healthcare Providers: seriousbroker.it  This test is not yet approved or cleared by the [place]  FDA and has been authorized for detection and/or diagnosis of SARS-CoV-2 by FDA under an Emergency Use Authorization (EUA). This EUA will remain in effect (meaning this test can be used) for the duration of the COVID-19 declaration under Section 564(b)(1) of the Act, 21 U.S.C. section 360bbb-3(b)(1), unless the authorization is terminated or revoked.     Resp Syncytial Virus by PCR NEGATIVE NEGATIVE Final    Comment: (NOTE) Fact Sheet for Patients: bloggercourse.com  Fact Sheet for Healthcare Providers: seriousbroker.it  This test is not yet approved or cleared by the [place]  FDA and has been authorized for detection and/or diagnosis of SARS-CoV-2 by FDA under an Emergency Use Authorization (EUA). This EUA will remain in effect (meaning this test can be used) for the duration of the COVID-19 declaration under Section 564(b)(1) of the Act, 21 U.S.C. section 360bbb-3(b)(1), unless the authorization is terminated or revoked.  Performed at [facility], [address] [place], [place] [address]   MRSA Next Gen by PCR, Nasal     Status: None   Collection Time: 01/29/24  4:09 PM   Specimen: Urine, Catheterized; Nasal Swab  Result Value Ref Range Status   MRSA by PCR Next Gen NOT DETECTED NOT  DETECTED Final    Comment: (NOTE) The GeneXpert MRSA Assay (FDA approved for  NASAL specimens only), is one component of a comprehensive MRSA colonization surveillance program. It is not intended to diagnose MRSA infection nor to guide or monitor treatment for MRSA infections. Test performance is not FDA approved in patients less than 67 years old. Performed at [facility], [address] [place], [place] [address]   Respiratory (~20 pathogens) panel by PCR     Status: Abnormal   Collection Time: 01/29/24  4:34 PM   Specimen: Urine, Catheterized; Respiratory  Result Value Ref Range Status   Adenovirus NOT DETECTED NOT DETECTED Final   Coronavirus 229E NOT DETECTED NOT DETECTED Final    Comment: (NOTE) The Coronavirus on the Respiratory Panel, DOES NOT test for the novel  Coronavirus (2019 nCoV)    Coronavirus HKU1 NOT DETECTED NOT DETECTED Final   Coronavirus NL63 NOT DETECTED NOT DETECTED Final   Coronavirus OC43 NOT DETECTED NOT DETECTED Final   Metapneumovirus NOT DETECTED NOT DETECTED Final   Rhinovirus / Enterovirus DETECTED (A) NOT DETECTED Final   Influenza A NOT DETECTED NOT DETECTED Final   Influenza B NOT DETECTED NOT DETECTED Final   Parainfluenza Virus 1 NOT DETECTED NOT DETECTED Final   Parainfluenza Virus 2 NOT DETECTED NOT DETECTED Final   Parainfluenza Virus 3 NOT DETECTED NOT DETECTED Final   Parainfluenza Virus 4 NOT DETECTED NOT DETECTED Final   Respiratory Syncytial Virus NOT DETECTED NOT DETECTED Final   Bordetella pertussis NOT DETECTED NOT DETECTED Final   Bordetella Parapertussis NOT DETECTED NOT DETECTED Final   Chlamydophila pneumoniae NOT DETECTED NOT DETECTED Final   Mycoplasma pneumoniae NOT DETECTED NOT DETECTED Final    Comment: Performed at [facility], [address], [place], [place] [address]    Labs: CBC: Recent Labs  Lab 01/29/24 [phone] 01/29/24 1150 01/30/24 0339 01/31/24 0334 02/01/24 0512 02/03/24 0704 02/03/24 0804  WBC 10.2   < > 7.4 10.6* 11.6* 8.8 9.1  NEUTROABS 8.9*  --   --   --    --   --   --   HGB 10.8*   < > 9.3* 9.9* 10.6* 9.7* 9.5*  HCT 36.3   < > 30.4* 31.6* 34.0* 32.4* 32.2*  MCV 100.6*   < > 98.7 97.5 98.6 100.6* 101.6*  PLT 170   < > 122* 140* 141* 146* 148*   < > = values in this interval not displayed.   Basic Metabolic Panel: Recent Labs  Lab 01/30/24 0339 01/31/24 0334 02/01/24 0512 02/03/24 0704 02/03/24 0804  NA 138 136 138 139 138  K 3.4* 4.3 4.1 4.5 4.3  CL 96* 95* 96* 99 98  CO2 26 26 32 28 27  GLUCOSE 88 119* 105* 129* 138*  BUN 33* 48* 66* 59* 60*  CREATININE 5.38* 7.21* 8.68* 7.78* 7.87*  CALCIUM  7.5* 7.3* 7.3* 7.0* 7.0*  MG 1.9 2.2 2.5*  --   --   PHOS 4.0 5.0* 6.1* 5.9* 5.8*   Liver Function Tests: Recent Labs  Lab 01/29/24 0711 01/30/24 0339 01/31/24 0334 02/03/24 0704 02/03/24 0804  AST 31 22  --   --   --   ALT 25 17  --   --   --   ALKPHOS 99 71  --   --   --   BILITOT 0.4 0.4  --   --   --   PROT 7.6 5.9*  --   --   --  ALBUMIN 4.1 3.2* 3.5 3.4* 3.4*   CBG: Recent Labs  Lab 01/30/24 0008 01/30/24 0747 01/30/24 1128 01/30/24 1948 01/30/24 2312  GLUCAP 82 93 95 148* 110*    Discharge time spent: greater than 30 minutes.  Signed: Charlie Patterson, MD [facility] 02/03/2024 "

## 2024-02-03 NOTE — TOC CM/SW Note (Signed)
 Due to the severity of the patient's chronic respiratory failure, secondary to COPD, the patient requires volume targeted pressure support from Home Mechanical Ventilation. BIPAP, BIPAP ST/ STA (RAD) have been ruled out. The patient has been on NIV settings here in the hospital for over 24 hours prior to discharge. RAD with or without back up rate does not supply volume targeted ventilation to manage the patients Hypercapnia. OSA is not the predominant cause of their hypercapnia, and HMV is not being ordered to treat OSA. The patient requires ventilatory support for more than eight hours per 24-hour period to normalize the patient's PaCO2 and prevent readmissions. Failure to adequately ventilate will result in serious harm and/or death to the patient.

## 2024-02-03 NOTE — TOC Initial Note (Signed)
 Transition of Care [facility]) - Initial/Assessment Note    Patient Details  Name: Anna Key MRN: [id number] Date of Birth: September 19, 1950  Transition of Care [facility] CM/SW Contact:    Lauraine JAYSON Carpen, LCSW Phone Number: 02/03/2024, 2:47 PM  Clinical Narrative:  CSW met with patient. No family at bedside. CSW introduced role and explained that PT recommendations would be discussed. Patient is unsure if she wants home health. She gave CSW permission to send out home health referral. [employer] needs to know when to deliver NIV. Sent secure chat to RN to see what time she will be picked up. Per patient, niece will transport her home.                Expected Discharge Plan: Home w Home Health Services Barriers to Discharge: No Barriers Identified   Patient Goals and CMS Choice            Expected Discharge Plan and Services     Post Acute Care Choice: Durable Medical Equipment Living arrangements for the past 2 months: Apartment Expected Discharge Date: 02/03/24                                    Prior Living Arrangements/Services Living arrangements for the past 2 months: Apartment Lives with:: Spouse Patient language and need for interpreter reviewed:: Yes Do you feel safe going back to the place where you live?: Yes      Need for Family Participation in Patient Care: Yes (Comment) Care giver support system in place?: Yes (comment)   Criminal Activity/Legal Involvement Pertinent to Current Situation/Hospitalization: No - Comment as needed  Activities of Daily Living      Permission Sought/Granted Permission sought to share information with : Facility Industrial/product Designer granted to share information with : Yes, Verbal Permission Granted     Permission granted to share info w AGENCY: Home health agencies        Emotional Assessment Appearance:: Appears stated age Attitude/Demeanor/Rapport: Engaged, Gracious Affect (typically observed): Appropriate,  Calm Orientation: : Oriented to Self, Oriented to Place, Oriented to  Time, Oriented to Situation Alcohol / Substance Use: Not Applicable Psych Involvement: No (comment)  Admission diagnosis:  Hyperuricemia [E79.0] ESRD (end stage renal disease) (HCC) [N18.6] Acute respiratory failure with hypercapnia (HCC) [J96.02] Community acquired pneumonia of right middle lobe of lung [J18.9] AMS (altered mental status) [R41.82] Acute hypoxic on chronic hypercapnic respiratory failure (HCC) [J96.01, J96.12] Patient Active Problem List   Diagnosis Date Noted   Multifocal pneumonia 01/31/2024   Pressure injury of skin 01/31/2024   COPD with acute exacerbation (HCC) 01/31/2024   Acute metabolic encephalopathy 01/31/2024   Pleural effusion due to CHF (congestive heart failure) (HCC) 01/29/2024   Acute hypoxic on chronic hypercapnic respiratory failure (HCC) 01/29/2024   End stage renal disease on dialysis (HCC) 11/29/2021   Acute diastolic CHF (congestive heart failure) (HCC) 09/18/2020   Hyperkalemia 09/18/2020   Iron  deficiency anemia 05/24/2020   Anemia in chronic kidney disease 03/08/2020   Cigarette smoker 03/08/2020   Edema of lower extremity 03/08/2020   History of hepatitis C 03/08/2020   Hyperparathyroidism due to renal insufficiency 03/08/2020   Persistent proteinuria 03/08/2020   Renal insufficiency 09/02/2019   Essential hypertension 09/02/2019   Hyperlipidemia 09/02/2019   CAD (coronary artery disease) 09/02/2019   Hypocalcemia 06/04/2019   Chronic viral hepatitis C (HCC) 10/22/2017   Status post right partial knee  replacement 06/11/2017   Healthcare maintenance 04/04/2017   LVH (left ventricular hypertrophy) due to hypertensive disease 04/04/2017   Chronic kidney disease, stage 4 (severe) (HCC) 04/04/2017   Primary osteoarthritis of right knee 06/27/2015   Shoulder pain, left 01/19/2014   Chronic kidney disease (CKD), stage V (HCC) 06/12/2013   Gout 06/05/2012   Tobacco use  06/05/2012   Primary localized osteoarthrosis, lower leg 06/09/2010   Atherosclerotic heart disease of native coronary artery without angina pectoris 06/02/2009   PCP:  Lenon Layman ORN, MD Pharmacy:   [facility] [address] ([address]), [place] - [address] [place]) [place] [address] Phone: [phone] Fax: [phone]     Social Drivers of Health (SDOH) Social History: SDOH Screenings   Food Insecurity: Patient Unable To Answer (01/29/2024)  Housing: Patient Unable To Answer (01/29/2024)  Transportation Needs: Patient Unable To Answer (01/29/2024)  Utilities: Patient Unable To Answer (01/29/2024)  Depression (PHQ2-9): Low Risk (09/19/2021)  Social Connections: Patient Unable To Answer (01/29/2024)  Tobacco Use: High Risk (01/29/2024)   SDOH Interventions:     Readmission Risk Interventions     No data to display

## 2024-02-03 NOTE — Discharge Instructions (Signed)
 Next dialysis is Wednesday with the holiday schedule.  [employer] heath to set up NIV trilogy machine at night.  Try to wear on nightly basis for as many hours as you can tolerate.

## 2024-02-03 NOTE — Progress Notes (Signed)
 Patient was discharged from the hospital on 3L [place]. Patient has been on 2L [place] chronic at home. Patient's family member came to pick up patient without brining portable oxygen  tank from home. Patient's niece upset that patient was discharged without one of our portable oxygen  tanks. This RN explained that when a patient who was previously on oxygen  therapy before hospitalization is discharged that family is to bring portable oxygen  tank from home. Niece asked if they could borrow an oxygen  tank from hospital. This RN explained borrowing oxygen  tank would be fine as long as they can return it to [facility] on unit 1[facility]. Patient's niece irritable on the phone and stated I'll just figure it out on my own and I'll have my attorney get back to you. Charge RN notified of event above.

## 2024-02-03 NOTE — Progress Notes (Signed)
" °  Received patient in bed to unit.   Informed consent signed and in chart.    TX duration: 3hrs     Transported back to floor  Hand-off given to patient's nurse. No acute distress noted  no c/o    Access used: L AVF Access issues: none   Total UF removed: 1.5L Medication(s) given: none Post HD VS: wnl      Olivia Hurst LPN [facility]   "

## 2024-02-03 NOTE — Progress Notes (Signed)
 " [facility]  ROUNDING NOTE   Subjective:   Patient well-known to us  as we follow her for outpatient hemodialysis treatment at [facility] on TTS schedule.  Had significant lethargy upon admission and found to have RSV infection.  Subsequently developed acute respiratory failure.  Update:  Patient seen and evaluated during dialysis   HEMODIALYSIS FLOWSHEET:  Blood Flow Rate (mL/min): 0 mL/min Arterial Pressure (mmHg): -45.05 mmHg Venous Pressure (mmHg): 176.76 mmHg TMP (mmHg): 18.79 mmHg Ultrafiltration Rate (mL/min): 1013 mL/min Dialysate Flow Rate (mL/min): 300 ml/min Dialysis Fluid Bolus: Normal Saline Bolus Amount (mL): 100 mL  Tolerating treatment well   Objective:  Vital signs in last 24 hours:  Temp:  [97.8 F (36.6 C)-98.4 F (36.9 C)] 98.4 F (36.9 C) (12/22 1108) Pulse Rate:  [74-102] 98 (12/22 1108) Resp:  [15-24] 19 (12/22 1108) BP: (118-153)/(71-82) 150/82 (12/22 1108) SpO2:  [88 %-100 %] 93 % (12/22 1108) Weight:  [68 kg] 68 kg (12/22 0752)  Weight change:  Filed Weights   02/01/24 1111 02/02/24 0500 02/03/24 0752  Weight: 64.3 kg 66.5 kg 68 kg    Intake/Output: I/O last 3 completed shifts: In: 480 [P.O.:480] Out: -    Intake/Output this shift:  Total I/O In: -  Out: 1500 [Other:1500]  Physical Exam: General: No acute distress  Head: Normocephalic, atraumatic.  Hearing intact  Lungs:  Clear to auscultation, normal effort  Heart: S1S2 no rubs  Abdomen:  Soft, nontender, bowel sounds present  Extremities: Trace peripheral edema.  Neurologic: Awake, alert, conversant  Skin: No acute rash  Access: Left upper extremity AV access    Basic Metabolic Panel: Recent Labs  Lab 01/30/24 0339 01/31/24 0334 02/01/24 0512 02/03/24 0704 02/03/24 0804  NA 138 136 138 139 138  K 3.4* 4.3 4.1 4.5 4.3  CL 96* 95* 96* 99 98  CO2 26 26 32 28 27  GLUCOSE 88 119* 105* 129* 138*  BUN 33* 48* 66* 59* 60*  CREATININE 5.38*  7.21* 8.68* 7.78* 7.87*  CALCIUM  7.5* 7.3* 7.3* 7.0* 7.0*  MG 1.9 2.2 2.5*  --   --   PHOS 4.0 5.0* 6.1* 5.9* 5.8*    Liver Function Tests: Recent Labs  Lab 01/29/24 0711 01/30/24 0339 01/31/24 0334 02/03/24 0704 02/03/24 0804  AST 31 22  --   --   --   ALT 25 17  --   --   --   ALKPHOS 99 71  --   --   --   BILITOT 0.4 0.4  --   --   --   PROT 7.6 5.9*  --   --   --   ALBUMIN 4.1 3.2* 3.5 3.4* 3.4*   No results for input(s): LIPASE, AMYLASE in the last 168 hours. Recent Labs  Lab 01/29/24 1150  AMMONIA 50*    CBC: Recent Labs  Lab 01/29/24 0711 01/29/24 1150 01/30/24 0339 01/31/24 0334 02/01/24 0512 02/03/24 0704 02/03/24 0804  WBC 10.2   < > 7.4 10.6* 11.6* 8.8 9.1  NEUTROABS 8.9*  --   --   --   --   --   --   HGB 10.8*   < > 9.3* 9.9* 10.6* 9.7* 9.5*  HCT 36.3   < > 30.4* 31.6* 34.0* 32.4* 32.2*  MCV 100.6*   < > 98.7 97.5 98.6 100.6* 101.6*  PLT 170   < > 122* 140* 141* 146* 148*   < > = values in this interval not displayed.  Cardiac Enzymes: No results for input(s): CKTOTAL, CKMB, CKMBINDEX, TROPONINI in the last 168 hours.  BNP: Invalid input(s): POCBNP  CBG: Recent Labs  Lab 01/30/24 0008 01/30/24 0747 01/30/24 1128 01/30/24 1948 01/30/24 2312  GLUCAP 82 93 95 148* 110*    Microbiology: Results for orders placed or performed during the hospital encounter of 01/29/24  Blood Culture (routine x 2)     Status: None   Collection Time: 01/29/24  7:11 AM   Specimen: BLOOD  Result Value Ref Range Status   Specimen Description BLOOD BLOOD LEFT HAND  Final   Special Requests   Final    BOTTLES DRAWN AEROBIC AND ANAEROBIC Blood Culture adequate volume   Culture   Final    NO GROWTH 5 DAYS Performed at [facility], [address] [place], [place] [address]    Report Status 02/03/2024 FINAL  Final  Blood Culture (routine x 2)     Status: None   Collection Time: 01/29/24  7:12 AM   Specimen: BLOOD  Result Value  Ref Range Status   Specimen Description BLOOD RIGHT ANTECUBITAL  Final   Special Requests   Final    BOTTLES DRAWN AEROBIC AND ANAEROBIC Blood Culture adequate volume   Culture   Final    NO GROWTH 5 DAYS Performed at [facility], [address] [place], [place] [address]    Report Status 02/03/2024 FINAL  Final  Resp panel by RT-PCR (RSV, Flu A&B, Covid) Anterior Nasal Swab     Status: None   Collection Time: 01/29/24  8:35 AM   Specimen: Anterior Nasal Swab  Result Value Ref Range Status   SARS Coronavirus 2 by RT PCR NEGATIVE NEGATIVE Final    Comment: (NOTE) SARS-CoV-2 target nucleic acids are NOT DETECTED.  The SARS-CoV-2 RNA is generally detectable in upper respiratory specimens during the acute phase of infection. The lowest concentration of SARS-CoV-2 viral copies this assay can detect is 138 copies/mL. A negative result does not preclude SARS-Cov-2 infection and should not be used as the sole basis for treatment or other patient management decisions. A negative result may occur with  improper specimen collection/handling, submission of specimen other than nasopharyngeal swab, presence of viral mutation(s) within the areas targeted by this assay, and inadequate number of viral copies(<138 copies/mL). A negative result must be combined with clinical observations, patient history, and epidemiological information. The expected result is Negative.  Fact Sheet for Patients:  bloggercourse.com  Fact Sheet for Healthcare Providers:  seriousbroker.it  This test is no t yet approved or cleared by the [place]  FDA and  has been authorized for detection and/or diagnosis of SARS-CoV-2 by FDA under an Emergency Use Authorization (EUA). This EUA will remain  in effect (meaning this test can be used) for the duration of the COVID-19 declaration under Section 564(b)(1) of the Act, 21 U.S.C.section 360bbb-3(b)(1),  unless the authorization is terminated  or revoked sooner.       Influenza A by PCR NEGATIVE NEGATIVE Final   Influenza B by PCR NEGATIVE NEGATIVE Final    Comment: (NOTE) The Xpert Xpress SARS-CoV-2/FLU/RSV plus assay is intended as an aid in the diagnosis of influenza from Nasopharyngeal swab specimens and should not be used as a sole basis for treatment. Nasal washings and aspirates are unacceptable for Xpert Xpress SARS-CoV-2/FLU/RSV testing.  Fact Sheet for Patients: bloggercourse.com  Fact Sheet for Healthcare Providers: seriousbroker.it  This test is not yet approved or cleared by the [place]  FDA and  has been authorized for detection and/or diagnosis of SARS-CoV-2 by FDA under an Emergency Use Authorization (EUA). This EUA will remain in effect (meaning this test can be used) for the duration of the COVID-19 declaration under Section 564(b)(1) of the Act, 21 U.S.C. section 360bbb-3(b)(1), unless the authorization is terminated or revoked.     Resp Syncytial Virus by PCR NEGATIVE NEGATIVE Final    Comment: (NOTE) Fact Sheet for Patients: bloggercourse.com  Fact Sheet for Healthcare Providers: seriousbroker.it  This test is not yet approved or cleared by the [place]  FDA and has been authorized for detection and/or diagnosis of SARS-CoV-2 by FDA under an Emergency Use Authorization (EUA). This EUA will remain in effect (meaning this test can be used) for the duration of the COVID-19 declaration under Section 564(b)(1) of the Act, 21 U.S.C. section 360bbb-3(b)(1), unless the authorization is terminated or revoked.  Performed at [facility], [address] [place], [place] [address]   MRSA Next Gen by PCR, Nasal     Status: None   Collection Time: 01/29/24  4:09 PM   Specimen: Urine, Catheterized; Nasal Swab  Result Value Ref Range Status    MRSA by PCR Next Gen NOT DETECTED NOT DETECTED Final    Comment: (NOTE) The GeneXpert MRSA Assay (FDA approved for NASAL specimens only), is one component of a comprehensive MRSA colonization surveillance program. It is not intended to diagnose MRSA infection nor to guide or monitor treatment for MRSA infections. Test performance is not FDA approved in patients less than 17 years old. Performed at [facility], [address] [place], [place] [address]   Respiratory (~20 pathogens) panel by PCR     Status: Abnormal   Collection Time: 01/29/24  4:34 PM   Specimen: Urine, Catheterized; Respiratory  Result Value Ref Range Status   Adenovirus NOT DETECTED NOT DETECTED Final   Coronavirus 229E NOT DETECTED NOT DETECTED Final    Comment: (NOTE) The Coronavirus on the Respiratory Panel, DOES NOT test for the novel  Coronavirus (2019 nCoV)    Coronavirus HKU1 NOT DETECTED NOT DETECTED Final   Coronavirus NL63 NOT DETECTED NOT DETECTED Final   Coronavirus OC43 NOT DETECTED NOT DETECTED Final   Metapneumovirus NOT DETECTED NOT DETECTED Final   Rhinovirus / Enterovirus DETECTED (A) NOT DETECTED Final   Influenza A NOT DETECTED NOT DETECTED Final   Influenza B NOT DETECTED NOT DETECTED Final   Parainfluenza Virus 1 NOT DETECTED NOT DETECTED Final   Parainfluenza Virus 2 NOT DETECTED NOT DETECTED Final   Parainfluenza Virus 3 NOT DETECTED NOT DETECTED Final   Parainfluenza Virus 4 NOT DETECTED NOT DETECTED Final   Respiratory Syncytial Virus NOT DETECTED NOT DETECTED Final   Bordetella pertussis NOT DETECTED NOT DETECTED Final   Bordetella Parapertussis NOT DETECTED NOT DETECTED Final   Chlamydophila pneumoniae NOT DETECTED NOT DETECTED Final   Mycoplasma pneumoniae NOT DETECTED NOT DETECTED Final    Comment: Performed at [facility], [address], [place], [place] [address]    Coagulation Studies: No results for input(s): LABPROT, INR in the last 72  hours.   Urinalysis: No results for input(s): COLORURINE, LABSPEC, PHURINE, GLUCOSEU, HGBUR, BILIRUBINUR, KETONESUR, PROTEINUR, UROBILINOGEN, NITRITE, LEUKOCYTESUR in the last 72 hours.  Invalid input(s): APPERANCEUR     Imaging: No results found.    Medications:    azithromycin  500 mg (02/02/24 1100)   cefTRIAXone  (ROCEPHIN )  IV 2 g (02/02/24 1005)    allopurinol   100 mg Oral Daily  vitamin C   500 mg Oral BID   aspirin  EC  325 mg Oral Daily   Chlorhexidine  Gluconate Cloth  6 each Topical Q0600   feeding supplement (NEPRO CARB STEADY)  237 mL Oral TID BM   heparin   5,000 Units Subcutaneous Q8H   ipratropium-albuterol   3 mL Nebulization TID   losartan   12.5 mg Oral QHS   metoprolol   25 mg Oral q morning   multivitamin  1 tablet Oral QHS   acetaminophen  **OR** acetaminophen , ondansetron  **OR** ondansetron  (ZOFRAN ) IV, mouth rinse, polyethylene glycol  Assessment/ Plan:  73 y.o. female with past medical history of ESRD on HD TTS, COPD on home oxygen  2 L, CHF, hepatitis C, hypertension, upper lipidemia, anemia chronic kidney disease, secondary hyperparathyroidism who came in with lethargy and ultimately found to have acute respiratory failure.  1.  ESRD on HD TTS.  Receiving dialysis today, UF 1.5L as tolerated. Next treatment scheduled for Wednesday  2.  Acute respiratory failure.  Required ventilatory support shortly after admission.  Extubated 01/30/2024.  Remains on 4L   3.  Anemia chronic kidney disease.   Lab Results  Component Value Date   HGB 9.5 (L) 02/03/2024  Hemoglobin 9.5.    4.  Secondary hyperparathyroidism. Corrected calcium  7.5. Receives calcitriol  outpatient.  Phosphorus elevated   LOS: 5 Anna Key 12/22/202511:47 AM   "

## 2024-02-04 ENCOUNTER — Encounter: Payer: Self-pay | Admitting: Oncology

## 2024-02-07 ENCOUNTER — Other Ambulatory Visit: Payer: Self-pay

## 2024-02-11 NOTE — Result Encounter Note (Signed)
 Still some pneumonia, will recheck cxr when she comes back

## 2024-02-11 NOTE — Progress Notes (Signed)
 Discharge Summaries - documented in this encounter  Josette Ade, MD - 02/03/2024 2:40 PM EST Formatting of this note is different from the original. Images from the original note were not included.  Physician Discharge Summary  Patient: Anna Key MRN: [id number] DOB: Feb 19, 1950 Admit date: 01/29/2024 Discharge date: 02/03/2024 Discharge Physician: Ade Josette  PCP: Lenon Layman ORN, MD  Recommendations at discharge:  Follow-up PCP 5 days Dialysis schedule be Wednesday or Friday this week because of the Christmas holiday.  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 female with medical history significant for COPD on home oxygen  2 L via nasal cannula, ESRD on hemodialysis, anemia, CHF, hep C, HTN, HLD who was brought into the ED on 01/29/24 for concern for shortness of breath, cough and confusion. Patient is currently intubated and sedated and unable to contribute to history, therefore history is obtained from chart review.  It is reported that EMS found her hypoxic with O2 saturations of 82% on room air, confused and lethargic. Reportedly the patient endorsed a productive cough for a few days along with audible wheezing.  ED Course: Initial Vital Signs: Temperature 98.6 F orally, RR 21, pulse 83, BP 176/91, SpO2 80% on room air Significant Labs: Potassium 5.2, glucose 114, BUN 69, Creatinine 9.3, Pro-BNP >35000, lactic acid 0.9, procalcitonin 0.48, WBC 10.2 Imaging Chest X-ray>>IMPRESSION: 1.  Increased interstitial markings and trace bilateral pleural effusions concerning for mild congestive heart failure. . CT Head wo contrast>>IMPRESSION: 1. No acute findings. 2. Mild chronic ischemic microvascular disease. Medications Administered: 80 mg IV Lasix , Levaquin , duonebs, 500 cc NS bolus  TRH asked to admit for further workup and treatment. Found to be Hypercapnic and was placed on BiPAP, but ultimately with poor tolerance of BiPAP with worsening hypoxia, required intubation in the ED. PCCM consulted.  12/17: Presented to ED due to shortness of breath and AMS. Placed on BiPAP, TRH asked to admit with plans for urgent HD. Remained hypoxic and altered despite BiPAP requiring intubation. PCCM consulted. 12/18: Received HD last night, Viral panel positive for Rhinovirus. On minimal vent support, plan for WUA & SBT as tolerated utilizing Precedex . Patient extubated.  12/19. Patient transferred to medical service. Stable for transfer out of the ICU. 12/20. Patient still with shortness of breath and wheezing. VBG showing a pCO2 of 70. Will try to get noninvasive ventilation approved at night. BiPAP ordered at night. 12/22. Patient had dialysis today will be switching to Monday Wednesday Friday dialysis schedule for the holiday. Hemoglobin 9.5. Stable for discharge.  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 Patient had severe acidosis on presentation. Extubated on 12/18. Currently back on 3 L of oxygen  which slightly higher than her chronic requirement. VBG shows she is still retaining with a pCO2 of 70. Will do a BiPAP trial. Transitional care team to set up NIV (noninvasive ventilation) trilogy at night.  Multifocal pneumonia Unclear if viral or bacterial. Completed Rocephin  and Zithromax . Likely secondary to rhinovirus.  COPD with acute exacerbation (HCC) Completed Solu-Medrol  and nebulizer  treatments.  Due to the severity of the patient's chronic  respiratory failure, secondary to COPD, the patient requires volume targeted pressure support from Home Mechanical Ventilation. BIPAP, BIPAP ST/ STA (RAD) have been ruled out. The patient has been on NIV settings here in the hospital for over 24 hours prior to discharge. RAD with or without back up rate does not supply volume targeted ventilation to manage the patients Hypercapnia. OSA is not the predominant cause of their hypercapnia, and HMV is not being ordered to treat OSA. The patient requires ventilatory support for more than eight hours per 24-hour period to normalize the patient's PaCO2 and prevent readmissions. Failure to adequately ventilate will result in serious harm and/or death to the patient.  Acute diastolic CHF (congestive heart failure) (HCC) Echocardiogram with an EF of 60%, moderately elevated pulmonary artery systolic pressure, moderate aortic valve regurgitation and moderate aortic valve stenosis. Dialysis to manage fluid.  End stage renal disease on dialysis [facility]) Dialysis done Monday  Acute metabolic encephalopathy Some intermittent confusion. Likely secondary to severe acidosis and CO2 retention on admission. Will discontinue Solu-Medrol . Will benefit from noninvasive ventilation at night.  Pressure injury of skin Wound 01/29/24 1545 Pressure Injury Sacrum Mid;Lower Stage 2 - Partial thickness loss of dermis presenting as a shallow open injury with a red, pink wound bed without slough. (Active)  Present on admission  Hyperkalemia Dialysis helped manage  Anemia in chronic kidney disease Hemoglobin 9.5  Essential hypertension Restarted low-dose losartan  and Toprol     Consultants: Critical care team Procedures performed: Intubation extubation Disposition: Home Diet recommendation: Renal diet DISCHARGE MEDICATION: Allergies as of 02/03/2024  Reactions Penicillins Hives, Rash Has patient had a PCN  reaction causing immediate rash, facial/tongue/throat swelling, SOB or lightheadedness with hypotension: Yes Has patient had a PCN reaction causing severe rash involving mucus membranes or skin necrosis: Yes--all over body Has patient had a PCN reaction that required hospitalization: No Has patient had a PCN reaction occurring within the last 10 years: No If all of the above answers are NO, then may proceed with Cephalosporin use.     Medication List   STOP taking these medications  amLODipine  10 MG tablet Commonly known as: NORVASC   atorvastatin  40 MG tablet Commonly known as: LIPITOR  calcitRIOL  0.25 MCG capsule Commonly known as: ROCALTROL   isosorbide  mononitrate 60 MG 24 hr tablet Commonly known as: IMDUR      TAKE these medications  allopurinol  100 MG tablet Commonly known as: ZYLOPRIM  Take 1 tablet (100 mg total) by mouth every morning.  ascorbic acid  500 MG tablet Commonly known as: VITAMIN C  Take 1 tablet (500 mg total) by mouth daily.  aspirin  EC 325 MG tablet Take 1 tablet (325 mg total) by mouth daily.  b complex-vitamin c -folic acid  0.8 MG Tabs tablet Take 1 tablet by mouth at bedtime.  feeding supplement (NEPRO CARB STEADY) Liqd Take 237 mLs by mouth 3 (three) times daily between meals.  losartan  25 MG tablet Commonly known as: COZAAR  Take 1 tablet (25 mg total) by mouth at bedtime.  metoprolol  succinate 25 MG 24 hr tablet Commonly known as: TOPROL -XL Take 1 tablet (25 mg total) by mouth every morning. Start taking on: February 04, 2024 What changed: medication strength how much to take     Follow-up Information  Lenon Layman ORN, MD Follow up on 02/11/2024. Specialty: Internal Medicine Why: at 215 Contact information: [address] [facility] [place] [address] [phone]  Berthe Oley is a 73 y.o. female here for follow up of their medical problems  CHIEF COMPLAINT:  Follow up medical  problems in the problem list and as discussed in the history and assessment areas as well as new complaints as listed.   Patient Active Problem List  Diagnosis   Primary osteoarthritis of right knee   LVH (left ventricular hypertrophy) due to hypertensive disease   Chronic kidney disease (CKD), stage V (CMS/HHS-HCC)   CAD (coronary artery disease)   Healthcare maintenance   Status post right partial knee replacement   History of hepatitis C     HISTORY OF PRESENT ILLNESS:  Chronic kidney disease (CKD), stage V (CMS-HCC) Post admission with multifocal pneumonia and respiratory failure post intubation. Continues on dialysis and bipap at home per notes.   Past Medical History:  Diagnosis Date   CHF (congestive heart failure) (CMS/HHS-HCC)    Chronic kidney disease (CKD), stage III (moderate) (CMS-HCC)    Gout    Hyperlipidemia    Hypertension     Past Surgical History:  Procedure Laterality Date   Left Total Knee Replaced Left 2012   [facility]   Right Unicondylar knee arthroplasty Right 06/11/2017   Dr.Poggi    COLONOSCOPY  03/22/2021   Tubular adenomas/Repeat 74yrs/[facility]   EGD  03/22/2021   Gastritis/No repeat/[facility]   CORONARY ANGIOPLASTY       No fever chills or sweats   No nausea, vomiting or diarrhea  No chest pain, shortness of breath   Social History   Socioeconomic History   Marital status: Married  Tobacco Use   Smoking status: Every Day    Types: Cigarettes   Smokeless tobacco: Never  Vaping Use   Vaping status: Never Used  Substance and Sexual Activity   Alcohol use: No   Drug use: No   Social Drivers of Health   Food Insecurity: Patient Unable To Answer (01/29/2024)   Received from [facility]   Hunger Vital Sign    Within the past 12 months, you worried that your food would run out before you got the money to buy more.: Patient unable to answer    Within the past 12 months, the food you bought just didn't last and you didn't have  money to get more.: Patient unable to answer  Transportation Needs: Patient Unable To Answer (01/29/2024)   Received from [facility]    In the past 12 months, has lack of transportation kept you from medical appointments or from getting medications?: Patient unable to answer    In the past 12 months, has lack of transportation kept you from meetings, work, or from getting things needed for daily living?: Patient unable to answer  Social Connections: Patient Unable To Answer (01/29/2024)   Received from [facility]   Social Connection and Isolation Panel    In a typical week, how many times do you talk on the phone with family, friends, or neighbors?: Patient unable to answer    How often do you get together with friends or relatives?: Patient unable to answer    How often do you attend church or religious services?: Patient unable to answer    Do you belong to any clubs or organizations such as church groups, unions, fraternal or athletic groups, or school groups?: Patient unable to answer    How often do you attend meetings of the clubs or organizations you belong to?: Patient unable to answer    Are you married, widowed, divorced, separated, never married, or living with a partner?: Patient unable to answer  Housing Stability: Unknown (02/11/2024)   Housing Stability Vital Sign    Homeless in the Last Year: No      Current Outpatient Medications:    allopurinoL  (ZYLOPRIM ) 100 MG tablet, TAKE 1 TABLET ONE TIME DAILY, Disp: 90 tablet, Rfl: 3   amLODIPine  (NORVASC ) 10 MG tablet, TAKE 1 TABLET EVERY DAY, Disp: 90 tablet, Rfl: 3   ascorbic acid , vitamin C , (VITAMIN C ) 500 MG tablet, Take 500 mg by mouth once daily, Disp: , Rfl:    aspirin  325 MG EC tablet, Take 325 mg by mouth once daily, Disp: , Rfl:    atorvastatin  (LIPITOR) 40 MG tablet, TAKE 1 TABLET EVERY DAY, Disp: 90 tablet, Rfl: 3   calcium  carbonate/vitamin D3 (CALCIUM  500 + D, D3, ORAL),  Take 1 tablet by mouth once daily, Disp: , Rfl:    losartan  (COZAAR ) 25 MG tablet, Take 25 mg by mouth at bedtime, Disp: , Rfl:    metoprolol  SUCCinate (TOPROL -XL) 25 MG XL tablet, Take 25 mg by mouth once daily, Disp: , Rfl:    RENA-VITE 0.8 mg, Take 1 tablet by mouth at bedtime, Disp: , Rfl:    fluticasone-umeclidinium-vilanterol (TRELEGY ELLIPTA) 100-62.5-25 mcg inhaler, Inhale 1 Puff into the lungs once daily, Disp: 1 each, Rfl: 11  Vitals:   02/11/24 1423  BP: 132/66  Pulse: 71   Body mass index is 29.23 kg/m. No acute distress Lungs; clear to ascultation Heart; Regular rate and rhythm  Abdomen; Soft and flat, normal bowel sounds Extremities; No clubbing, cyanosis or edema  No visits with results within 3 Month(s) from this visit.  Latest known visit with results is:  Office Visit on 04/18/2022  Component Date Value Ref Range Status   Cholesterol, Total 04/18/2022 141  100 - 200 mg/dL Final   Triglyceride 96/93/7975 89  35 - 199 mg/dL Final   HDL (High Density Lipoprotein) Cho* 04/18/2022 55.4  35.0 - 85.0 mg/dL Final   LDL Calculated 04/18/2022 68  0 - 130 mg/dL Final   VLDL Cholesterol 04/18/2022 18  mg/dL Final   Cholesterol/HDL Ratio 04/18/2022 2.5   Final   Glucose 04/18/2022 93  70 - 110 mg/dL Final   Sodium 96/93/7975 142  136 - 145 mmol/L Final   Potassium 04/18/2022 4.1  3.6 - 5.1 mmol/L Final   Chloride 04/18/2022 102  97 - 109 mmol/L Final   Carbon Dioxide (CO2) 04/18/2022 31.9  22.0 - 32.0 mmol/L Final   Urea Nitrogen (BUN) 04/18/2022 50 (H)  7 - 25 mg/dL Final   Creatinine 96/93/7975 5.8 (H)  0.6 - 1.1 mg/dL Final   Glomerular Filtration Rate (eGFR) 04/18/2022 7 (L)  >60 mL/min/1.73sq m Final   Calcium  04/18/2022 7.5 (L)  8.7 - 10.3 mg/dL Final   AST  96/93/7975 21  8 - 39 U/L Final   ALT  04/18/2022 16  5 - 38 U/L Final   Alk Phos (alkaline Phosphatase) 04/18/2022 80  34 - 104 U/L Final   Albumin 04/18/2022 4.1  3.5 - 4.8 g/dL Final    Bilirubin, Total 04/18/2022 0.4  0.3 - 1.2 mg/dL Final   Protein, Total 04/18/2022 7.3  6.1 - 7.9 g/dL Final   A/G Ratio 96/93/7975 1.3  1.0 - 5.0 gm/dL Final   Hemoglobin J8R 04/18/2022 5.8 (H)  4.2 - 5.6 % Final   Average Blood Glucose (Calc) 04/18/2022 120  mg/dL Final    ASSESSMENT  AND PLAN:  Diagnoses and all orders for this visit:  Chronic kidney disease (  CKD), stage V (CMS/HHS-HCC) Assessment & Plan: Post admission with multifocal pneumonia and respiratory failure post intubation. Continues on dialysis and bipap at home per notes.    Coronary artery disease involving native heart with angina pectoris, unspecified vessel or lesion type  Pneumonia due to infectious organism, unspecified laterality, unspecified part of lung -     X-ray chest PA and lateral; Future  Other orders -     fluticasone-umeclidinium-vilanterol (TRELEGY ELLIPTA) 100-62.5-25 mcg inhaler; Inhale 1 Puff into the lungs once daily

## 2024-03-02 ENCOUNTER — Other Ambulatory Visit: Payer: Self-pay
